# Patient Record
Sex: Female | Born: 1950
Health system: Southern US, Community
[De-identification: ages and names within clinical notes are randomized; demographics above are authoritative.]

## PROBLEM LIST (undated history)

## (undated) DIAGNOSIS — E039 Hypothyroidism, unspecified: Secondary | ICD-10-CM

## (undated) DIAGNOSIS — F419 Anxiety disorder, unspecified: Secondary | ICD-10-CM

## (undated) DIAGNOSIS — T7840XA Allergy, unspecified, initial encounter: Secondary | ICD-10-CM

## (undated) DIAGNOSIS — C801 Malignant (primary) neoplasm, unspecified: Secondary | ICD-10-CM

## (undated) DIAGNOSIS — F32A Depression, unspecified: Secondary | ICD-10-CM

## (undated) DIAGNOSIS — T4145XA Adverse effect of unspecified anesthetic, initial encounter: Secondary | ICD-10-CM

## (undated) DIAGNOSIS — Z9889 Other specified postprocedural states: Secondary | ICD-10-CM

## (undated) DIAGNOSIS — T8859XA Other complications of anesthesia, initial encounter: Secondary | ICD-10-CM

## (undated) DIAGNOSIS — R112 Nausea with vomiting, unspecified: Secondary | ICD-10-CM

## (undated) DIAGNOSIS — N6011 Diffuse cystic mastopathy of right breast: Secondary | ICD-10-CM

## (undated) DIAGNOSIS — M1990 Unspecified osteoarthritis, unspecified site: Secondary | ICD-10-CM

## (undated) DIAGNOSIS — I10 Essential (primary) hypertension: Secondary | ICD-10-CM

## (undated) HISTORY — PX: ABDOMINAL HYSTERECTOMY: SHX81

## (undated) HISTORY — DX: Allergy, unspecified, initial encounter: T78.40XA

## (undated) HISTORY — DX: Anxiety disorder, unspecified: F41.9

## (undated) HISTORY — DX: Essential (primary) hypertension: I10

## (undated) HISTORY — DX: Hypothyroidism, unspecified: E03.9

## (undated) HISTORY — DX: Malignant (primary) neoplasm, unspecified: C80.1

## (undated) HISTORY — DX: Depression, unspecified: F32.A

## (undated) HISTORY — PX: OTHER SURGICAL HISTORY: SHX169

## (undated) HISTORY — DX: Unspecified osteoarthritis, unspecified site: M19.90

## (undated) HISTORY — DX: Diffuse cystic mastopathy of right breast: N60.11

---
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
## 1995-02-19 DIAGNOSIS — C801 Malignant (primary) neoplasm, unspecified: Secondary | ICD-10-CM

## 1995-02-19 HISTORY — DX: Malignant (primary) neoplasm, unspecified: C80.1

## 1995-02-19 HISTORY — PX: THYROIDECTOMY: SHX17

## 1998-06-22 ENCOUNTER — Ambulatory Visit (HOSPITAL_COMMUNITY): Admission: RE | Admit: 1998-06-22 | Discharge: 1998-06-22 | Payer: Self-pay | Admitting: Obstetrics and Gynecology

## 2003-01-14 ENCOUNTER — Encounter: Admission: RE | Admit: 2003-01-14 | Discharge: 2003-01-14 | Payer: Self-pay | Admitting: Internal Medicine

## 2004-02-07 ENCOUNTER — Encounter: Admission: RE | Admit: 2004-02-07 | Discharge: 2004-02-07 | Payer: Self-pay | Admitting: Internal Medicine

## 2004-04-19 ENCOUNTER — Ambulatory Visit: Payer: Self-pay | Admitting: Internal Medicine

## 2004-04-26 ENCOUNTER — Other Ambulatory Visit: Admission: RE | Admit: 2004-04-26 | Discharge: 2004-04-26 | Payer: Self-pay | Admitting: Internal Medicine

## 2004-04-26 ENCOUNTER — Ambulatory Visit: Payer: Self-pay | Admitting: Internal Medicine

## 2004-07-24 ENCOUNTER — Ambulatory Visit: Payer: Self-pay | Admitting: Internal Medicine

## 2005-03-20 ENCOUNTER — Encounter: Admission: RE | Admit: 2005-03-20 | Discharge: 2005-03-20 | Payer: Self-pay | Admitting: Internal Medicine

## 2005-04-26 ENCOUNTER — Ambulatory Visit: Payer: Self-pay | Admitting: Internal Medicine

## 2005-05-03 ENCOUNTER — Encounter: Payer: Self-pay | Admitting: Internal Medicine

## 2005-05-03 ENCOUNTER — Ambulatory Visit: Payer: Self-pay | Admitting: Internal Medicine

## 2005-05-03 ENCOUNTER — Other Ambulatory Visit: Admission: RE | Admit: 2005-05-03 | Discharge: 2005-05-03 | Payer: Self-pay | Admitting: Internal Medicine

## 2005-09-05 ENCOUNTER — Ambulatory Visit: Payer: Self-pay | Admitting: Internal Medicine

## 2006-03-24 ENCOUNTER — Encounter: Admission: RE | Admit: 2006-03-24 | Discharge: 2006-03-24 | Payer: Self-pay | Admitting: Internal Medicine

## 2006-05-08 ENCOUNTER — Ambulatory Visit: Payer: Self-pay | Admitting: Internal Medicine

## 2006-05-08 LAB — CONVERTED CEMR LAB
ALT: 26 units/L (ref 0–40)
AST: 23 units/L (ref 0–37)
Albumin: 3.8 g/dL (ref 3.5–5.2)
Alkaline Phosphatase: 66 units/L (ref 39–117)
BUN: 9 mg/dL (ref 6–23)
Basophils Absolute: 0 10*3/uL (ref 0.0–0.1)
Basophils Relative: 0.8 % (ref 0.0–1.0)
Bilirubin, Direct: 0.1 mg/dL (ref 0.0–0.3)
CO2: 33 meq/L — ABNORMAL HIGH (ref 19–32)
Calcium: 9.1 mg/dL (ref 8.4–10.5)
Chloride: 104 meq/L (ref 96–112)
Cholesterol: 168 mg/dL (ref 0–200)
Creatinine, Ser: 0.5 mg/dL (ref 0.4–1.2)
Eosinophils Absolute: 0.1 10*3/uL (ref 0.0–0.6)
Eosinophils Relative: 2.8 % (ref 0.0–5.0)
GFR calc Af Amer: 165 mL/min
GFR calc non Af Amer: 137 mL/min
Glucose, Bld: 96 mg/dL (ref 70–99)
HCT: 39 % (ref 36.0–46.0)
HDL: 42.1 mg/dL (ref >39.0)
Hemoglobin: 13.7 g/dL (ref 12.0–15.0)
LDL Cholesterol: 107 mg/dL — ABNORMAL HIGH (ref 0–99)
Lymphocytes Relative: 37.9 % (ref 12.0–46.0)
MCHC: 35.1 g/dL (ref 30.0–36.0)
MCV: 85 fL (ref 78.0–100.0)
Monocytes Absolute: 0.5 10*3/uL (ref 0.2–0.7)
Monocytes Relative: 10.3 % (ref 3.0–11.0)
Neutro Abs: 2.3 10*3/uL (ref 1.4–7.7)
Neutrophils Relative %: 48.2 % (ref 43.0–77.0)
Platelets: 258 10*3/uL (ref 150–400)
Potassium: 3.5 meq/L (ref 3.5–5.1)
RBC: 4.58 M/uL (ref 3.87–5.11)
RDW: 12.3 % (ref 11.5–14.6)
Sodium: 142 meq/L (ref 135–145)
TSH: 0.02 microintl units/mL — ABNORMAL LOW (ref 0.35–5.50)
Total Bilirubin: 0.9 mg/dL (ref 0.3–1.2)
Total CHOL/HDL Ratio: 4
Total Protein: 6.7 g/dL (ref 6.0–8.3)
Triglycerides: 94 mg/dL (ref 0–149)
VLDL: 19 mg/dL (ref 0–40)
WBC: 4.7 10*3/uL (ref 4.5–10.5)

## 2006-05-16 ENCOUNTER — Encounter: Payer: Self-pay | Admitting: Internal Medicine

## 2006-05-16 ENCOUNTER — Other Ambulatory Visit: Admission: RE | Admit: 2006-05-16 | Discharge: 2006-05-16 | Payer: Self-pay | Admitting: Internal Medicine

## 2006-05-16 ENCOUNTER — Ambulatory Visit: Payer: Self-pay | Admitting: Internal Medicine

## 2007-04-06 ENCOUNTER — Encounter: Admission: RE | Admit: 2007-04-06 | Discharge: 2007-04-06 | Payer: Self-pay | Admitting: Internal Medicine

## 2007-05-19 ENCOUNTER — Ambulatory Visit: Payer: Self-pay | Admitting: Internal Medicine

## 2007-05-19 DIAGNOSIS — J309 Allergic rhinitis, unspecified: Secondary | ICD-10-CM

## 2007-05-19 DIAGNOSIS — E039 Hypothyroidism, unspecified: Secondary | ICD-10-CM | POA: Insufficient documentation

## 2007-05-19 DIAGNOSIS — I10 Essential (primary) hypertension: Secondary | ICD-10-CM | POA: Insufficient documentation

## 2007-05-19 DIAGNOSIS — M12559 Traumatic arthropathy, unspecified hip: Secondary | ICD-10-CM | POA: Insufficient documentation

## 2007-05-19 HISTORY — DX: Allergic rhinitis, unspecified: J30.9

## 2007-05-20 ENCOUNTER — Ambulatory Visit: Payer: Self-pay | Admitting: Internal Medicine

## 2007-05-22 ENCOUNTER — Ambulatory Visit: Payer: Self-pay | Admitting: Internal Medicine

## 2007-05-22 LAB — CONVERTED CEMR LAB
ALT: 24 units/L (ref 0–35)
AST: 23 units/L (ref 0–37)
Albumin: 3.9 g/dL (ref 3.5–5.2)
Alkaline Phosphatase: 54 units/L (ref 39–117)
BUN: 11 mg/dL (ref 6–23)
Basophils Absolute: 0 10*3/uL (ref 0.0–0.1)
Basophils Relative: 1 % (ref 0.0–1.0)
Bilirubin, Direct: 0.1 mg/dL (ref 0.0–0.3)
Blood in Urine, dipstick: NEGATIVE
CO2: 31 meq/L (ref 19–32)
Calcium: 9 mg/dL (ref 8.4–10.5)
Chloride: 106 meq/L (ref 96–112)
Cholesterol: 164 mg/dL (ref 0–200)
Creatinine, Ser: 0.7 mg/dL (ref 0.4–1.2)
Eosinophils Absolute: 0.1 10*3/uL (ref 0.0–0.7)
Eosinophils Relative: 3.1 % (ref 0.0–5.0)
GFR calc Af Amer: 112 mL/min
GFR calc non Af Amer: 92 mL/min
Glucose, Bld: 92 mg/dL (ref 70–99)
Glucose, Urine, Semiquant: NEGATIVE
HCT: 41.4 % (ref 36.0–46.0)
HDL: 35.4 mg/dL — ABNORMAL LOW (ref >39.0)
Hemoglobin: 13.6 g/dL (ref 12.0–15.0)
Ketones, urine, test strip: NEGATIVE
LDL Cholesterol: 113 mg/dL — ABNORMAL HIGH (ref 0–99)
Lymphocytes Relative: 37.6 % (ref 12.0–46.0)
MCHC: 32.9 g/dL (ref 30.0–36.0)
MCV: 86.7 fL (ref 78.0–100.0)
Monocytes Absolute: 0.5 10*3/uL (ref 0.1–1.0)
Monocytes Relative: 10.5 % (ref 3.0–12.0)
Neutro Abs: 2.4 10*3/uL (ref 1.4–7.7)
Neutrophils Relative %: 47.8 % (ref 43.0–77.0)
Nitrite: NEGATIVE
Platelets: 294 10*3/uL (ref 150–400)
Potassium: 4.1 meq/L (ref 3.5–5.1)
RBC: 4.77 M/uL (ref 3.87–5.11)
RDW: 11.7 % (ref 11.5–14.6)
Sodium: 142 meq/L (ref 135–145)
Specific Gravity, Urine: 1.02
TSH: 0.02 microintl units/mL — ABNORMAL LOW (ref 0.35–5.50)
Total Bilirubin: 0.8 mg/dL (ref 0.3–1.2)
Total CHOL/HDL Ratio: 4.6
Total Protein: 6.6 g/dL (ref 6.0–8.3)
Triglycerides: 80 mg/dL (ref 0–149)
Urobilinogen, UA: 0.2
VLDL: 16 mg/dL (ref 0–40)
WBC Urine, dipstick: NEGATIVE
WBC: 4.8 10*3/uL (ref 4.5–10.5)
pH: 7.5

## 2007-06-01 ENCOUNTER — Ambulatory Visit: Payer: Self-pay | Admitting: Internal Medicine

## 2007-06-01 ENCOUNTER — Encounter: Payer: Self-pay | Admitting: Internal Medicine

## 2007-06-01 ENCOUNTER — Other Ambulatory Visit: Admission: RE | Admit: 2007-06-01 | Discharge: 2007-06-01 | Payer: Self-pay | Admitting: Internal Medicine

## 2008-05-23 ENCOUNTER — Ambulatory Visit: Payer: Self-pay | Admitting: Internal Medicine

## 2008-05-23 LAB — CONVERTED CEMR LAB
ALT: 26 units/L (ref 0–35)
AST: 25 units/L (ref 0–37)
Albumin: 4.1 g/dL (ref 3.5–5.2)
Alkaline Phosphatase: 63 units/L (ref 39–117)
BUN: 9 mg/dL (ref 6–23)
Basophils Absolute: 0.1 10*3/uL (ref 0.0–0.1)
Basophils Relative: 0.9 % (ref 0.0–3.0)
Bilirubin Urine: NEGATIVE
Bilirubin, Direct: 0 mg/dL (ref 0.0–0.3)
Blood in Urine, dipstick: NEGATIVE
CO2: 28 meq/L (ref 19–32)
Calcium: 9 mg/dL (ref 8.4–10.5)
Chloride: 105 meq/L (ref 96–112)
Cholesterol: 176 mg/dL (ref 0–200)
Creatinine, Ser: 0.6 mg/dL (ref 0.4–1.2)
Eosinophils Absolute: 0.2 10*3/uL (ref 0.0–0.7)
Eosinophils Relative: 2.8 % (ref 0.0–5.0)
GFR calc non Af Amer: 109.67 mL/min (ref >60)
Glucose, Bld: 92 mg/dL (ref 70–99)
Glucose, Urine, Semiquant: NEGATIVE
HCT: 39.5 % (ref 36.0–46.0)
HDL: 32.8 mg/dL — ABNORMAL LOW (ref >39.00)
Hemoglobin: 13.6 g/dL (ref 12.0–15.0)
LDL Cholesterol: 121 mg/dL — ABNORMAL HIGH (ref 0–99)
Lymphocytes Relative: 36.4 % (ref 12.0–46.0)
Lymphs Abs: 2.1 10*3/uL (ref 0.7–4.0)
MCHC: 34.4 g/dL (ref 30.0–36.0)
MCV: 85.4 fL (ref 78.0–100.0)
Monocytes Absolute: 0.6 10*3/uL (ref 0.1–1.0)
Monocytes Relative: 10.2 % (ref 3.0–12.0)
Neutro Abs: 2.7 10*3/uL (ref 1.4–7.7)
Neutrophils Relative %: 49.7 % (ref 43.0–77.0)
Nitrite: NEGATIVE
Platelets: 386 10*3/uL (ref 150.0–400.0)
Potassium: 4.3 meq/L (ref 3.5–5.1)
RBC: 4.63 M/uL (ref 3.87–5.11)
RDW: 11.4 % — ABNORMAL LOW (ref 11.5–14.6)
Sodium: 142 meq/L (ref 135–145)
Specific Gravity, Urine: 1.02
TSH: 0.03 microintl units/mL — ABNORMAL LOW (ref 0.35–5.50)
Total Bilirubin: 0.7 mg/dL (ref 0.3–1.2)
Total CHOL/HDL Ratio: 5
Total Protein: 7.1 g/dL (ref 6.0–8.3)
Triglycerides: 111 mg/dL (ref 0.0–149.0)
Urobilinogen, UA: 0.2
VLDL: 22.2 mg/dL (ref 0.0–40.0)
WBC Urine, dipstick: NEGATIVE
WBC: 5.7 10*3/uL (ref 4.5–10.5)
pH: 6.5

## 2008-05-24 ENCOUNTER — Encounter: Admission: RE | Admit: 2008-05-24 | Discharge: 2008-05-24 | Payer: Self-pay | Admitting: Internal Medicine

## 2008-06-07 ENCOUNTER — Other Ambulatory Visit: Admission: RE | Admit: 2008-06-07 | Discharge: 2008-06-07 | Payer: Self-pay | Admitting: Neurosurgery

## 2008-06-07 ENCOUNTER — Encounter: Payer: Self-pay | Admitting: Internal Medicine

## 2008-06-07 ENCOUNTER — Ambulatory Visit: Payer: Self-pay | Admitting: Internal Medicine

## 2008-06-07 DIAGNOSIS — N72 Inflammatory disease of cervix uteri: Secondary | ICD-10-CM | POA: Insufficient documentation

## 2008-06-07 LAB — CONVERTED CEMR LAB: Pap Smear: NORMAL

## 2008-06-10 ENCOUNTER — Encounter (INDEPENDENT_AMBULATORY_CARE_PROVIDER_SITE_OTHER): Payer: Self-pay | Admitting: *Deleted

## 2009-02-13 ENCOUNTER — Ambulatory Visit: Payer: Self-pay | Admitting: Internal Medicine

## 2009-02-13 DIAGNOSIS — R5383 Other fatigue: Secondary | ICD-10-CM | POA: Insufficient documentation

## 2009-02-13 DIAGNOSIS — F5102 Adjustment insomnia: Secondary | ICD-10-CM | POA: Insufficient documentation

## 2009-02-13 DIAGNOSIS — R5381 Other malaise: Secondary | ICD-10-CM | POA: Insufficient documentation

## 2009-02-14 ENCOUNTER — Telehealth: Payer: Self-pay | Admitting: Internal Medicine

## 2009-02-14 ENCOUNTER — Encounter: Payer: Self-pay | Admitting: Internal Medicine

## 2009-02-14 LAB — CONVERTED CEMR LAB
Basophils Absolute: 0.1 10*3/uL (ref 0.0–0.1)
Basophils Relative: 0.8 % (ref 0.0–3.0)
Eosinophils Absolute: 0.1 10*3/uL (ref 0.0–0.7)
Eosinophils Relative: 1 % (ref 0.0–5.0)
Folate: >20 ng/mL
Free T4: 1.9 ng/dL — ABNORMAL HIGH (ref 0.6–1.6)
HCT: 44.8 % (ref 36.0–46.0)
Hemoglobin: 15 g/dL (ref 12.0–15.0)
Lymphocytes Relative: 22.9 % (ref 12.0–46.0)
Lymphs Abs: 1.8 10*3/uL (ref 0.7–4.0)
MCHC: 33.5 g/dL (ref 30.0–36.0)
MCV: 89.8 fL (ref 78.0–100.0)
Monocytes Absolute: 0.7 10*3/uL (ref 0.1–1.0)
Monocytes Relative: 8.5 % (ref 3.0–12.0)
Neutro Abs: 5.1 10*3/uL (ref 1.4–7.7)
Neutrophils Relative %: 66.8 % (ref 43.0–77.0)
Platelets: 267 10*3/uL (ref 150.0–400.0)
RBC: 4.99 M/uL (ref 3.87–5.11)
RDW: 12 % (ref 11.5–14.6)
T3, Free: 3.2 pg/mL (ref 2.3–4.2)
TSH: 0.02 microintl units/mL — ABNORMAL LOW (ref 0.35–5.50)
Vitamin B-12: 1208 pg/mL — ABNORMAL HIGH (ref 211–911)
WBC: 7.8 10*3/uL (ref 4.5–10.5)

## 2009-02-15 ENCOUNTER — Telehealth: Payer: Self-pay | Admitting: Internal Medicine

## 2009-02-16 ENCOUNTER — Ambulatory Visit: Payer: Self-pay | Admitting: Licensed Clinical Social Worker

## 2009-02-16 ENCOUNTER — Encounter: Payer: Self-pay | Admitting: Internal Medicine

## 2009-02-20 ENCOUNTER — Telehealth: Payer: Self-pay | Admitting: Internal Medicine

## 2009-02-21 ENCOUNTER — Ambulatory Visit: Payer: Self-pay | Admitting: Psychiatry

## 2009-02-21 ENCOUNTER — Telehealth: Payer: Self-pay | Admitting: Internal Medicine

## 2009-02-21 ENCOUNTER — Emergency Department (HOSPITAL_COMMUNITY): Admission: EM | Admit: 2009-02-21 | Discharge: 2009-02-21 | Payer: Self-pay | Admitting: Emergency Medicine

## 2009-02-21 ENCOUNTER — Inpatient Hospital Stay (HOSPITAL_COMMUNITY): Admission: EM | Admit: 2009-02-21 | Discharge: 2009-02-25 | Payer: Self-pay | Admitting: Psychiatry

## 2009-02-27 ENCOUNTER — Encounter: Payer: Self-pay | Admitting: Internal Medicine

## 2009-02-27 ENCOUNTER — Ambulatory Visit: Payer: Self-pay | Admitting: Licensed Clinical Social Worker

## 2009-03-02 ENCOUNTER — Telehealth: Payer: Self-pay | Admitting: Internal Medicine

## 2009-03-03 ENCOUNTER — Ambulatory Visit: Payer: Self-pay | Admitting: Internal Medicine

## 2009-03-03 DIAGNOSIS — F4322 Adjustment disorder with anxiety: Secondary | ICD-10-CM | POA: Insufficient documentation

## 2009-03-06 ENCOUNTER — Ambulatory Visit: Payer: Self-pay | Admitting: Licensed Clinical Social Worker

## 2009-03-13 ENCOUNTER — Ambulatory Visit: Payer: Self-pay | Admitting: Licensed Clinical Social Worker

## 2009-03-15 ENCOUNTER — Ambulatory Visit: Payer: Self-pay | Admitting: Internal Medicine

## 2009-03-20 ENCOUNTER — Ambulatory Visit: Payer: Self-pay | Admitting: Licensed Clinical Social Worker

## 2009-03-20 ENCOUNTER — Ambulatory Visit: Payer: Self-pay | Admitting: Internal Medicine

## 2009-03-28 ENCOUNTER — Telehealth: Payer: Self-pay | Admitting: Internal Medicine

## 2009-04-06 ENCOUNTER — Ambulatory Visit: Payer: Self-pay | Admitting: Internal Medicine

## 2009-05-19 ENCOUNTER — Ambulatory Visit: Payer: Self-pay | Admitting: Internal Medicine

## 2009-06-02 ENCOUNTER — Encounter: Admission: RE | Admit: 2009-06-02 | Discharge: 2009-06-02 | Payer: Self-pay | Admitting: Internal Medicine

## 2009-06-12 ENCOUNTER — Encounter: Admission: RE | Admit: 2009-06-12 | Discharge: 2009-06-12 | Payer: Self-pay | Admitting: Internal Medicine

## 2009-06-12 ENCOUNTER — Encounter: Payer: Self-pay | Admitting: Internal Medicine

## 2009-06-12 LAB — HM MAMMOGRAPHY

## 2009-07-27 ENCOUNTER — Ambulatory Visit: Payer: Self-pay | Admitting: Family Medicine

## 2009-07-27 DIAGNOSIS — M545 Low back pain, unspecified: Secondary | ICD-10-CM | POA: Insufficient documentation

## 2009-07-27 LAB — CONVERTED CEMR LAB
Bilirubin Urine: NEGATIVE
Blood in Urine, dipstick: NEGATIVE
Glucose, Urine, Semiquant: NEGATIVE
Ketones, urine, test strip: NEGATIVE
Nitrite: NEGATIVE
Protein, U semiquant: NEGATIVE
Specific Gravity, Urine: <1.005
Urobilinogen, UA: 0.2
WBC Urine, dipstick: NEGATIVE
pH: 7

## 2009-08-22 ENCOUNTER — Telehealth: Payer: Self-pay | Admitting: Internal Medicine

## 2009-09-15 ENCOUNTER — Ambulatory Visit: Payer: Self-pay | Admitting: Internal Medicine

## 2009-09-15 LAB — CONVERTED CEMR LAB
ALT: 24 units/L (ref 0–35)
AST: 25 units/L (ref 0–37)
Albumin: 4.2 g/dL (ref 3.5–5.2)
Alkaline Phosphatase: 48 units/L (ref 39–117)
BUN: 12 mg/dL (ref 6–23)
Basophils Absolute: 0.1 10*3/uL (ref 0.0–0.1)
Basophils Relative: 1.1 % (ref 0.0–3.0)
Bilirubin Urine: NEGATIVE
Bilirubin, Direct: 0.1 mg/dL (ref 0.0–0.3)
Blood in Urine, dipstick: NEGATIVE
CO2: 31 meq/L (ref 19–32)
Calcium: 8.6 mg/dL (ref 8.4–10.5)
Chloride: 104 meq/L (ref 96–112)
Cholesterol: 194 mg/dL (ref 0–200)
Creatinine, Ser: 0.6 mg/dL (ref 0.4–1.2)
Eosinophils Absolute: 0.2 10*3/uL (ref 0.0–0.7)
Eosinophils Relative: 3.7 % (ref 0.0–5.0)
GFR calc non Af Amer: 103.18 mL/min (ref >60)
Glucose, Bld: 87 mg/dL (ref 70–99)
Glucose, Urine, Semiquant: NEGATIVE
HCT: 38.8 % (ref 36.0–46.0)
HDL: 40.8 mg/dL (ref >39.00)
Hemoglobin: 13.3 g/dL (ref 12.0–15.0)
LDL Cholesterol: 140 mg/dL — ABNORMAL HIGH (ref 0–99)
Lymphocytes Relative: 35.8 % (ref 12.0–46.0)
Lymphs Abs: 1.8 10*3/uL (ref 0.7–4.0)
MCHC: 34.4 g/dL (ref 30.0–36.0)
MCV: 88.7 fL (ref 78.0–100.0)
Monocytes Absolute: 0.5 10*3/uL (ref 0.1–1.0)
Monocytes Relative: 9.3 % (ref 3.0–12.0)
Neutro Abs: 2.5 10*3/uL (ref 1.4–7.7)
Neutrophils Relative %: 50.1 % (ref 43.0–77.0)
Nitrite: NEGATIVE
Platelets: 251 10*3/uL (ref 150.0–400.0)
Potassium: 3.7 meq/L (ref 3.5–5.1)
RBC: 4.37 M/uL (ref 3.87–5.11)
RDW: 13.1 % (ref 11.5–14.6)
Sodium: 139 meq/L (ref 135–145)
Specific Gravity, Urine: 1.025
TSH: 0.04 microintl units/mL — ABNORMAL LOW (ref 0.35–5.50)
Total Bilirubin: 0.9 mg/dL (ref 0.3–1.2)
Total CHOL/HDL Ratio: 5
Total Protein: 6.7 g/dL (ref 6.0–8.3)
Triglycerides: 68 mg/dL (ref 0.0–149.0)
Urobilinogen, UA: 0.2
VLDL: 13.6 mg/dL (ref 0.0–40.0)
WBC Urine, dipstick: NEGATIVE
WBC: 5 10*3/uL (ref 4.5–10.5)
pH: 5.5

## 2009-10-06 ENCOUNTER — Other Ambulatory Visit: Admission: RE | Admit: 2009-10-06 | Discharge: 2009-10-06 | Payer: Self-pay | Admitting: Internal Medicine

## 2009-10-06 ENCOUNTER — Ambulatory Visit: Payer: Self-pay | Admitting: Internal Medicine

## 2009-10-06 LAB — CONVERTED CEMR LAB: Pap Smear: NORMAL

## 2009-10-11 LAB — CONVERTED CEMR LAB: Pap Smear: NEGATIVE

## 2010-01-05 ENCOUNTER — Ambulatory Visit: Payer: Self-pay | Admitting: Internal Medicine

## 2010-01-05 LAB — CONVERTED CEMR LAB
Free T4: 1.41 ng/dL (ref 0.60–1.60)
T3, Free: 3.2 pg/mL (ref 2.3–4.2)

## 2010-01-19 ENCOUNTER — Ambulatory Visit: Payer: Self-pay | Admitting: Internal Medicine

## 2010-02-23 ENCOUNTER — Ambulatory Visit
Admission: RE | Admit: 2010-02-23 | Discharge: 2010-02-23 | Payer: Self-pay | Source: Home / Self Care | Attending: Internal Medicine | Admitting: Internal Medicine

## 2010-03-06 ENCOUNTER — Telehealth: Payer: Self-pay | Admitting: Internal Medicine

## 2010-03-20 NOTE — Progress Notes (Signed)
Summary: insect bite  Phone Note Call from Patient   Caller: Patient Call For: Stacie Glaze MD Summary of Call: Pt stepped on bee 2 days ago, and has had allergic reactions in the past.  Inside arch of foot.......itches really bad...Marland KitchenMarland Kitchenused steroid cream..  Is askng for oral steroids or shot [phone]  [facility] Initial call taken by: Lynann Beaver CMA,  August 22, 2009 12:19 PM  Follow-up for Phone Call        prednisone 20 mg by mouth once daily for 4 days Follow-up by: Birdie Sons MD,  August 22, 2009 3:25 PM    New/Updated Medications: PREDNISONE 20 MG TABS (PREDNISONE) one by mouth daily x 4 days Prescriptions: PREDNISONE 20 MG TABS (PREDNISONE) one by mouth daily x 4 days  #4 x 0   Entered by:   Lynann Beaver CMA   Authorized by:   Birdie Sons MD   Signed by:   Lynann Beaver CMA on 08/22/2009   Method used:   [facility]        [facility]  [address] [id number]       [address]       [place], [place]  [address]       Ph: [phone] or [phone]       Fax: [phone]   RxID:   [id number]  pt notified.

## 2010-03-20 NOTE — Assessment & Plan Note (Signed)
Summary: cpx/pap/njr   Vital Signs:  Patient profile:   60 year old female Height:      71 inches Weight:      180 pounds BMI:     25.20 Temp:     98.2 degrees F oral Pulse rate:   72 / minute Resp:     14 per minute BP sitting:   136 / 82  (left arm)  Vitals Entered By: Willy Eddy, LPN (October 06, 2009 2:43 PM) CC: cpx and pap---no colonoscopy Is Patient Diabetic? No     Last PAP Result normal   CC:  cpx and pap---no colonoscopy.  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 1 Once Daily 2)  Atenolol-Chlorthalidone 50-25 Mg  Tabs (Atenolol-Chlorthalidone) .... Once Daily 3)  Flonase 50 Mcg/act  Susp (Fluticasone Propionate) .... As Needed 4)  Esgic-Plus 50-500-40 Mg  Tabs (Butalbital-Apap-Caffeine) .... One By Mouth Q 6 Hours As Needed Ha 5)  Cymbalta 60 Mg Cpep (Duloxetine Hcl) .... One By Mouth Daily 6)  Ativan 0.5 Mg Tabs (Lorazepam) .[name].. 1 Q6 Hours As Needed  Allergies (verified): No Known Drug Allergies  Past History:  Social History: Last updated: 05/19/2007 Married Former Smoker  Risk Factors: Smoking Status: never (10/06/2009)  Past medical, surgical, family and social histories (including risk factors) reviewed, and no changes noted (except as noted below).  Past Medical History: Reviewed history from 05/19/2007 and no changes  required. Hypertension Hypothyroidism Allergic rhinitis  Family History: Reviewed history and no changes required.  Social History: Reviewed history from 05/19/2007 and no changes required. Married Former Smoker  Review of Systems  The patient denies anorexia, fever, weight loss, weight gain, vision loss, decreased hearing, hoarseness, chest pain, syncope, dyspnea on exertion, peripheral edema, prolonged cough, headaches, hemoptysis, abdominal pain, melena, hematochezia, severe indigestion/heartburn, hematuria, incontinence, genital sores, muscle weakness, suspicious skin lesions, transient blindness, difficulty walking, depression, unusual weight change, abnormal bleeding, enlarged lymph nodes, angioedema, and breast masses.    Physical Exam  General:  Well-developed,well-nourished,in no acute distress; alert,appropriate and cooperative throughout examination Head:  Normocephalic and atraumatic without obvious abnormalities. No apparent alopecia or balding. Eyes:  pupils equal and pupils round.   Ears:  R ear normal and L ear normal.   Nose:  no external deformity and no nasal discharge.   Neck:  No deformities, masses, or tenderness noted. Breasts:  had 5x3 cm cyst at 12 oclock on the left breast similar to prior Lungs:  Normal respiratory effort, chest expands symmetrically. Lungs are clear to auscultation, no crackles or wheezes. Heart:  Normal rate and regular rhythm. S1 and S2 normal without gallop, murmur, click, rub or other extra sounds. Abdomen:  soft, non-tender, no masses, and no guarding.   Msk:  Mild R SI joint tenderness. SLRs neg. Pain with back flexion of 35 degrees but none with back extension. Pulses:  R and L carotid,radial,femoral,dorsalis pedis and posterior tibial pulses are full  and equal bilaterally Extremities:  No clubbing, cyanosis, edema, or deformity noted with normal full range of motion of all joints.   Neurologic:  alert & oriented X3, strength  normal in all extremities, sensation intact to light touch, and DTRs symmetrical and normal.   Psych:  labile affect and moderately anxious.     Impression & Recommendations:  Problem # 1:  HYPOTHYROIDISM (ICD-244.9) slight suppression the thyroid need the moniter t3 and t4 in 3 month Her updated medication list for this problem includes:    Levothroid 175 Mcg Tabs (Levothyroxine sodium) .[name]... 1 once daily  Labs Reviewed: TSH: 0.04 (09/15/2009)    Chol: 194 (09/15/2009)   HDL: 40.80 (09/15/2009)   LDL: 140 (09/15/2009)   TG: 68.0 (09/15/2009)  Problem # 2:  PHYSICAL EXAMINATION (ICD-V70.0) Assessment: Unchanged The pt was asked about all immunizations, health maint. services that are appropriate to their age and was given guidance on diet exercize  and weight management  Mammogram: normal (08/17/2009) Pap smear: normal (10/06/2009) Td Booster: Td (05/19/2005)   Chol: 194 (09/15/2009)   HDL: 40.80 (09/15/2009)   LDL: 140 (09/15/2009)   TG: 68.0 (09/15/2009) TSH: 0.04 (09/15/2009)   Next mammogram due:: 08/2010 (10/06/2009)  Discussed using sunscreen, use of alcohol, drug use, self breast exam, routine dental care, routine eye care, schedule for GYN exam, routine physical exam, seat belts, multiple vitamins, osteoporosis prevention, adequate calcium intake in diet, recommendations for immunizations, mammograms and Pap smears.  Discussed exercise and checking cholesterol.  Discussed gun safety, safe sex, and contraception.  Complete Medication List: 1)  Levothroid 175 Mcg Tabs (Levothyroxine sodium) .[name].. 1 once daily 2)  Atenolol-chlorthalidone 50-25 Mg Tabs (Atenolol-chlorthalidone) .... Once daily 3)  Flonase 50 Mcg/act Susp (Fluticasone propionate) .... As needed 4)  Esgic-plus 50-500-40 Mg Tabs (Butalbital-apap-caffeine) .... One by mouth q 6 hours as needed ha 5)  Cymbalta 60 Mg Cpep (Duloxetine hcl) .... One by mouth daily 6)  Ativan 0.5 Mg Tabs (Lorazepam) .[name].. 1 q6 hours as  needed  Patient Instructions: 1)  Please schedule a follow-up appointment in 3 months. 2)  T3 free and T4 free fasting 244.8 Prescriptions: ATENOLOL-CHLORTHALIDONE 50-25 MG  TABS (ATENOLOL-CHLORTHALIDONE) once daily  #90 x 3   Entered and Authorized by:   Stacie Glaze MD   Signed by:   Stacie Glaze MD on 10/06/2009   Method used:   Electronically to        [facility] (retail)       [address]       [place], [place]  [address]       Ph: [phone]       Fax: [phone]   RxID:   [id number] LEVOTHROID 175 MCG TABS (LEVOTHYROXINE SODIUM) 1 once daily  #90 x 3   Entered and Authorized by:   Stacie Glaze MD   Signed by:   Stacie Glaze MD on 10/06/2009   Method used:   Electronically to        [facility] (retail)       [address]       [place], [place]  [address]       Ph: [phone]       Fax: [phone]   RxID:   [id number]    Preventive Care Screening  Mammogram:    Date:  08/17/2009    Next  Due:  08/2010    Results:  normal   Pap Smear:    Date:  10/06/2009    Next Due:  10/2010    Results:  normal

## 2010-03-20 NOTE — Progress Notes (Signed)
Summary: crisis mode   Phone Note Call from Patient   Caller: Theresa Perez [phone] Call For: [name] Summary of Call: Re depression and inability to sleep.  In crisis mode & at ER [facility] with her.  Thoughts of hurting self is she has to stay at home.  Feeling hopeless.  Sleeping med not working.  Zoloft not helping yet.   Please call if you can help, we are in dire need, & so we won't have to sit here & wait hours.  Will stay here if we have to.   Initial call taken by: Rudy Jew, RN,  February 21, 2009 12:43 PM  Follow-up for Phone Call        Relayed to pt that Dr. Lovell Sheehan would like her to be evaluated by pych,  and probable admission for evaluation. Follow-up by: Lynann Beaver CMA,  February 21, 2009 12:57 PM  Additional Follow-up for Phone Call Additional follow up Details #1::        agree with inpt Additional Follow-up by: Stacie Glaze MD,  February 21, 2009 1:00 PM

## 2010-03-20 NOTE — Progress Notes (Signed)
Summary: requesting alternative to sleep aid  Phone Note Call from Patient Call back at Home Phone [phone]   Caller: Patient-live call Summary of Call: Generic Ambien isn"t strong enough. Only sleeping for 2 hours. [facility] [address]  Initial call taken by: Warnell Forester,  February 20, 2009 9:57 AM  Follow-up for Phone Call        stop the Dud[place] and try seroquil 25 mg by mouth q HS  number 30 Follow-up by: Stacie Glaze MD,  February 20, 2009 12:09 PM    New/Updated Medications: SEROQUEL 25 MG TABS (QUETIAPINE FUMARATE) one by mouth q hs   Prescriptions: SEROQUEL 25 MG TABS (QUETIAPINE FUMARATE) one by mouth q hs  #30 x 0   Entered by:   Lynann Beaver CMA   Authorized by:   Stacie Glaze MD   Signed by:   Lynann Beaver CMA on 02/20/2009   Method used:   [facility]        [facility]  [address] [id number]       [address]       [place], [place]  [address]       Ph: [phone] or [phone]       Fax: [phone]   RxID:   [id number]  Pt notified.

## 2010-03-20 NOTE — Assessment & Plan Note (Signed)
Summary: severe lower back pain/pt can't straighten up/cjr   Vital Signs:  Patient profile:   60 year old female Temp:     99.4 degrees F oral BP sitting:   140 / 80  (left arm) Cuff size:   regular  Vitals Entered By: Sid Falcon LPN (July 28, 2839 4:16 PM) CC: severe low back pain, Back Pain   History of Present Illness: onset one week ago.       This is a 60 year old woman who presents with Back Pain.  The patient denies fever, chills, loss of sensation, urinary incontinence, and dysuria.  The pain is located in the right low back.  The pain radiates to the right buttock.  The pain is made worse by flexion.  The pain is made better by NSAID medications (advil).  Risk factors for serious underlying conditions include age >= 50 years.    Allergies (verified): No Known Drug Allergies  Past History:  Past Medical History: Last updated: 05/19/2007 Hypertension Hypothyroidism Allergic rhinitis  Social History: Last updated: 05/19/2007 Married Former Smoker PMH reviewed for relevance, SH/Risk Factors reviewed for relevance  Review of Systems  The patient denies anorexia, fever, weight loss, chest pain, peripheral edema, abdominal pain, melena, hematochezia, severe indigestion/heartburn, incontinence, muscle weakness, and enlarged lymph nodes.    Physical Exam  General:  Well-developed,well-nourished,in no acute distress; alert,appropriate and cooperative throughout examination Lungs:  Normal respiratory effort, chest expands symmetrically. Lungs are clear to auscultation, no crackles or wheezes. Heart:  Normal rate and regular rhythm. S1 and S2 normal without gallop, murmur, click, rub or other extra sounds. Msk:  Mild R SI joint tenderness. SLRs neg. Pain with back flexion of 35 degrees but none with back extension. Extremities:  No clubbing, cyanosis, edema, or deformity noted with normal full range of motion of all joints.   Neurologic:  alert & oriented X3, strength  normal in all extremities, sensation intact to light touch, and DTRs symmetrical and normal.     Impression & Recommendations:  Problem # 1:  LUMBAGO (ICD-724.2) Assessment New  try NSAID and reviewed some stretches.  UA unremarkable. Her updated medication list for this problem includes:    Esgic-plus 50-500-40 Mg Tabs (Butalbital-apap-caffeine) ..... One by mouth q 6 hours as needed ha    Etodolac 400 Mg Tabs (Etodolac) ..... One by mouth two times a day as needed  Orders: UA Dipstick w/o Micro (manual) (32440)  Complete Medication List: 1)  Levothroid 175 Mcg Tabs (Levothyroxine sodium) .[name].. 1 once daily 2)  Atenolol-chlorthalidone 50-25 Mg Tabs (Atenolol-chlorthalidone) .... Once daily 3)  Flonase 50 Mcg/act Susp (Fluticasone propionate) .... As needed 4)  Esgic-plus 50-500-40 Mg Tabs (Butalbital-apap-caffeine) .... One by mouth q 6 hours as needed ha 5)  Cymbalta 60 Mg Cpep (Duloxetine hcl) .... One by mouth daily 6)  Ativan 0.5 Mg Tabs (Lorazepam) .[name].. 1 q6 hours as needed 7)  Etodolac 400 Mg Tabs (Etodolac) .... One by mouth two times a day as needed  Patient Instructions: 1)  Most patients (90%) with low back pain will improve with time ( 2-6 weeks). Keep active but avoid activities that are painful. Apply moist heat and/or ice to lower back several times a day.  Prescriptions: ETODOLAC 400 MG TABS (ETODOLAC) one by mouth two times a day as needed  #40 x 0   Entered and Authorized by:   Evelena Peat MD   Signed by:   Evelena Peat MD on 07/27/2009   Method  used:   [facility]  [address] [id number]       [address]       [place], [place]  [address]       Ph: [phone] or [phone]       Fax: [phone]   RxID:   [id number]   Laboratory Results   Urine Tests    Routine Urinalysis   Color: yellow Appearance: Clear Glucose: negative   (Normal Range: Negative) Bilirubin: negative   (Normal Range:  Negative) Ketone: negative   (Normal Range: Negative) Spec. Gravity: <1.005   (Normal Range: 1.003-1.035) Blood: negative   (Normal Range: Negative) pH: 7.0   (Normal Range: 5.0-8.0) Protein: negative   (Normal Range: Negative) Urobilinogen: 0.2   (Normal Range: 0-1) Nitrite: negative   (Normal Range: Negative) Leukocyte Esterace: negative   (Normal Range: Negative)    Comments: Sid Falcon LPN  July 28, 4330 4:57 PM

## 2010-03-20 NOTE — Assessment & Plan Note (Signed)
Summary: POST HOS FUP/NJR   Vital Signs:  Patient profile:   60 year old female Height:      71 inches Weight:      170 pounds BMI:     23.80 Temp:     98.2 degrees F oral Pulse rate:   72 / minute Resp:     14 per minute BP sitting:   110 / 80  (left arm)  Vitals Entered By: Willy Eddy, LPN (March 03, 2009 11:24 AM) CC: post hospital-states she is better but not quite there yet--appear calmer and more concentrated on what she is talking about---had abnormal thryoid that synthoid needs changing per your note   CC:  post hospital-states she is better but not quite there yet--appear calmer and more concentrated on what she is talking about---had abnormal thryoid that synthoid needs changing per your note.  History of Present Illness: tHE PT IS ON SEROQUIL ( WE STARTED THIS  JUST BEFOR SHE WENT TO THE HOPSITAL) The main issue is the raceing thoughs and the insomnia on top of a OCD personality she is aware of the issue but cannot control it ( family  job and loife stresses are increased. 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 Once Daily 2)  Atenolol-Chlorthalidone 50-25 Mg  Tabs (Atenolol-Chlorthalidone) .... Once Daily 3)  Flonase 50 Mcg/act  Susp (Fluticasone Propionate) .... As Needed 4)  Esgic-Plus 50-500-40 Mg  Tabs (Butalbital-Apap-Caffeine) .... One By Mouth Q 6 Hours As Needed Ha 5)  Zoloft 50 Mg  Tabs (Sertraline Hcl) .... One By Mouth Daily 6)  Seroquel 25 Mg Tabs (Quetiapine Fumarate) .... One By Mouth Q Hs  Current Medications (verified): 1)  Levothyroxine Sodium 200 Mcg  Solr (Levothyroxine Sodium) .... Once Daily 2)  Atenolol-Chlorthalidone 50-25 Mg  Tabs (Atenolol-Chlorthalidone) .... Once Daily 3)  Flonase 50 Mcg/act  Susp (Fluticasone Propionate) .... As Needed 4)  Esgic-Plus 50-500-40 Mg  Tabs (Butalbital-Apap-Caffeine) .... One By Mouth Q 6 Hours As Needed Ha 5)  Zoloft 50 Mg Tabs (Sertraline Hcl) .... 2 Once Daily 6)  Seroquel 25 Mg Tabs (Quetiapine Fumarate) .... 2-4 Q Hs 7)  Ativan 0.5 Mg Tabs (Lorazepam) .[name].. 1 Q6 Hours As Needed  Allergies (verified): No Known Drug Allergies  Past History:  Social History: Last updated: 05/19/2007 Married Former Smoker  Risk Factors: Smoking Status: never (03/03/2009)  Past medical, surgical, family and social histories (including risk factors) reviewed, and no changes noted (except as noted below).  Past Medical History: Reviewed history from 05/19/2007 and no changes required. Hypertension Hypothyroidism Allergic rhinitis  Family History: Reviewed history and no changes required.  Social History: Reviewed history from 05/19/2007 and no changes required. Married Former Smoker  Review of Systems  The patient denies anorexia, fever, weight loss, weight gain, vision loss, decreased hearing, hoarseness, chest pain, syncope, dyspnea on exertion, peripheral edema, prolonged cough, headaches, hemoptysis, abdominal pain, melena, hematochezia, severe indigestion/heartburn, hematuria, incontinence, genital sores, muscle weakness,  suspicious skin lesions, transient blindness, difficulty walking, depression, unusual weight change, abnormal bleeding, enlarged lymph nodes, angioedema, and breast masses.    Physical Exam  General:  Well-developed,well-nourished,in no acute distress; alert,appropriate and cooperative throughout  examination Head:  Normocephalic and atraumatic without obvious abnormalities. No apparent alopecia or balding. Ears:  R ear normal and L ear normal.   Nose:  no external deformity and no nasal discharge.   Neck:  No deformities, masses, or tenderness noted. Lungs:  normal respiratory effort and no wheezes.   Heart:  normal rate and regular rhythm.   Psych:  labile affect and moderately anxious.     Impression & Recommendations:  Problem # 1:  HYPOTHYROIDISM (ICD-244.9) Assessment Deteriorated  Her updated medication list for this problem includes:    Levothyroxine Sodium 200 Mcg Solr (Levothyroxine sodium) ..... Once daily  Labs Reviewed: TSH: 0.02 (02/13/2009)    Chol: 176 (05/23/2008)   HDL: 32.80 (05/23/2008)   LDL: 121 (05/23/2008)   TG: 111.0 (05/23/2008)  Problem # 2:  TRANSIENT DISORDER INITIATING/MAINTAINING SLEEP (ICD-307.41) Assessment: Deteriorated  Problem # 3:  ADJUSTMENT DISORDER WITH ANXIETY (ICD-309.24) ativan and seroquil with continued psych counciling  Complete Medication List: 1)  Levothyroxine Sodium 200 Mcg Solr (Levothyroxine sodium) .... Once daily 2)  Atenolol-chlorthalidone 50-25 Mg Tabs (Atenolol-chlorthalidone) .... Once daily 3)  Flonase 50 Mcg/act Susp (Fluticasone propionate) .... As needed 4)  Esgic-plus 50-500-40 Mg Tabs (Butalbital-apap-caffeine) .... One by mouth q 6 hours as needed ha 5)  Zoloft 50 Mg Tabs (Sertraline hcl) .... 2 once daily 6)  Seroquel 25 Mg Tabs (Quetiapine fumarate) .... 2-4 q hs 7)  Ativan 0.5 Mg Tabs (Lorazepam) .[name].. 1 q6 hours as needed  Patient Instructions: 1)  Please schedule a follow-up appointment in 2 weeks.

## 2010-03-20 NOTE — Progress Notes (Signed)
----   Converted from flag ---- ---- 02/24/2009 12:25 PM, Willy Eddy, LPN wrote: CHECK CONE TSH ------------------------------  Phone Note Other Incoming   Reason for Call: Discuss lab or test results Request: Send information Summary of Call: pt had Thyroid studies at the hospitalt which showed increased levels of tyroid hormones above normal change synthroif to 175 by mouth daily  Initial call taken by: Stacie Glaze MD,  March 02, 2009 12:54 PM

## 2010-03-20 NOTE — Progress Notes (Signed)
Summary: MED DOSE DECREASE  Phone Note Call from Patient Call back at Home Phone [phone]   Caller: Patient Call For: Stacie Glaze MD Summary of Call: PT STATED LEVOTHYROXINE WAS CHANGE TO 175 MCG CALL INT0  [facility] [phone] Initial call taken by: Heron Sabins,  March 28, 2009 10:39 AM    New/Updated Medications: LEVOTHROID 175 MCG TABS (LEVOTHYROXINE SODIUM) 1 once daily Prescriptions: LEVOTHROID 175 MCG TABS (LEVOTHYROXINE SODIUM) 1 once daily  #30 x 6   Entered by:   Willy Eddy, LPN   Authorized by:   Stacie Glaze MD   Signed by:   Willy Eddy, LPN on 13/09/6576   Method used:   [facility]  [address] [id number]       [address]       [place], [place]  [address]       Ph: [phone] or [phone]       Fax: [phone]   RxID:   [id number]

## 2010-03-20 NOTE — Medication Information (Signed)
Summary: Coverage Approval for Zolpidem Tartrate/[employer]  Coverage Approval for Zolpidem Tartrate/[employer]   Imported By: Maryln Gottron 02/22/2009 12:51:32  _____________________________________________________________________  External Attachment:    Type:   Image     Comment:   External Document

## 2010-03-20 NOTE — Assessment & Plan Note (Signed)
Summary: 2 wk rov/mm   Vital Signs:  Patient profile:   60 year old female Height:      71 inches Weight:      168 pounds BMI:     23.52 Temp:     98.2 degrees F oral Pulse rate:   72 / minute Resp:     14 per minute BP sitting:   134 / 80  (left arm)  Vitals Entered By: Willy Eddy, LPN (March 20, 2009 1:32 PM) CC: ROA-IMPROVING, BUT C/O NO ENERGY, Hypertension Management   CC:  ROA-IMPROVING, BUT C/O NO ENERGY, and Hypertension Management.  History of Present Illness: I am not as good as I want to be.... when I awaken I do not have the energy I need and have trouble getting motivated. But has  concerns about  returning to work DIscusion of attempt to go back to work seems to be able to cfocus at this evaluation   Hypertension History:      She denies headache, chest pain, palpitations, dyspnea with exertion, orthopnea, PND, peripheral edema, visual symptoms, neurologic problems, syncope, and side effects from treatment.        Positive major cardiovascular risk factors include female age 5 years old or older and hypertension.  Negative major cardiovascular risk factors include non-tobacco-user status.     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) 8)  Allergic Rhinitis  (ICD-477.9) 9)  Hypothyroidism  (ICD-244.9) 10)  Hypertension  (ICD-401.9)  Current Problems (verified): 1)  Adjustment Disorder With Anxiety  (ICD-309.24) 2)  Malaise and Fatigue  (ICD-780.79) 3)  Transient Disorder Initiating/maintaining Sleep  (ICD-307.41) 4)  Nabothian Cyst  (ICD-616.0) 5)   Physical Examination  (ICD-V70.0) 6)  Neck Sprain and Strain  (ICD-847.0) 7)  Traumatic Arthropathy Pelvic Region and Thigh  (ICD-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 Once Daily 2)  Atenolol-Chlorthalidone 50-25 Mg  Tabs (Atenolol-Chlorthalidone) .... Once Daily 3)  Flonase 50 Mcg/act  Susp (Fluticasone Propionate) .... As Needed 4)  Esgic-Plus 50-500-40 Mg  Tabs (Butalbital-Apap-Caffeine) .... One By Mouth Q 6 Hours As Needed Ha 5)  Zoloft 50 Mg Tabs (Sertraline Hcl) .... 2 Once Daily 6)  Seroquel 25 Mg Tabs (Quetiapine Fumarate) .... 2-4 Q Hs 7)  Ativan 0.5 Mg Tabs (Lorazepam) .[name].. 1 Q6 Hours As Needed  Current Medications (verified): 1)  Levothyroxine Sodium 200 Mcg  Solr (Levothyroxine Sodium) .... Once Daily 2)  Atenolol-Chlorthalidone 50-25 Mg  Tabs (Atenolol-Chlorthalidone) .... Once Daily 3)  Flonase 50 Mcg/act  Susp (Fluticasone Propionate) .... As Needed 4)  Esgic-Plus 50-500-40 Mg  Tabs (Butalbital-Apap-Caffeine) .... One By Mouth Q 6 Hours As Needed Ha 5)  Zoloft 50 Mg Tabs (Sertraline Hcl) .... 2 Once Daily 6)  Seroquel 25 Mg Tabs (Quetiapine Fumarate) .... 2-4 Q Hs 7)  Ativan 0.5 Mg Tabs (Lorazepam) .[name].. 1 Q6 Hours As Needed  Allergies (verified): No Known Drug Allergies  Past History:  Social History: Last updated: 05/19/2007 Married Former  Smoker  Risk Factors: Smoking Status: never (03/20/2009)  Past medical, surgical, family and social histories (including risk factors) reviewed, and no changes noted (except as noted below).  Past Medical History: Reviewed history from 05/19/2007 and no changes required. Hypertension Hypothyroidism Allergic rhinitis  Family History: Reviewed history and no changes required.  Social History: Reviewed history from 05/19/2007 and no changes  required. Married Former Smoker  Review of Systems  The patient denies anorexia, fever, weight loss, weight gain, vision loss, decreased hearing, hoarseness, chest pain, syncope, dyspnea on exertion, peripheral edema, prolonged cough, headaches, hemoptysis, abdominal pain, melena, hematochezia, severe indigestion/heartburn, hematuria, incontinence, genital sores, muscle weakness, suspicious skin lesions, transient blindness, difficulty walking, depression, unusual weight change, abnormal bleeding, enlarged lymph nodes, angioedema, and breast masses.    Physical Exam  General:  Well-developed,well-nourished,in no acute distress; alert,appropriate and cooperative throughout examination Head:  Normocephalic and atraumatic without obvious abnormalities. No apparent alopecia or balding. Eyes:  pupils equal and pupils round.   Ears:  R ear normal and L ear normal.   Nose:  no external deformity and no nasal discharge.   Neck:  No deformities, masses, or tenderness noted. Lungs:  normal respiratory effort and no wheezes.   Heart:  normal rate and regular rhythm.   Abdomen:  soft, non-tender, no masses, and no guarding.   Extremities:  trace left pedal edema and trace right pedal edema.   Neurologic:  alert & oriented X3 and gait normal.     Impression & Recommendations:  Problem # 1:  ADJUSTMENT DISORDER WITH ANXIETY (ICD-309.24) discusson of motivation and medications  Problem # 2:  HYPERTENSION (ICD-401.9) Assessment: Unchanged  Her updated medication list for this problem includes:    Atenolol-chlorthalidone 50-25 Mg Tabs (Atenolol-chlorthalidone) ..... Once daily  BP today: 134/80 Prior BP: 110/80 (03/03/2009)  10 Yr Risk Heart Disease: 17 % Prior 10 Yr Risk Heart Disease: 24 % (02/13/2009)  Labs Reviewed: K+: 4.3 (05/23/2008) Creat: : 0.6 (05/23/2008)   Chol: 176 (05/23/2008)   HDL: 32.80 (05/23/2008)   LDL: 121 (05/23/2008)   TG: 111.0 (05/23/2008)  Problem # 3:   HYPOTHYROIDISM (ICD-244.9) Assessment: Unchanged  Her updated medication list for this problem includes:    Levothyroxine Sodium 200 Mcg Solr (Levothyroxine sodium) ..... Once daily  Labs Reviewed: TSH: 0.02 (02/13/2009)    Chol: 176 (05/23/2008)   HDL: 32.80 (05/23/2008)   LDL: 121 (05/23/2008)   TG: 111.0 (05/23/2008)  Problem # 4:  HYPERTENSION (ICD-401.9) Assessment: Unchanged  Her updated medication list for this problem includes:    Atenolol-chlorthalidone 50-25 Mg Tabs (Atenolol-chlorthalidone) ..... Once daily  BP today: 134/80 Prior BP: 110/80 (03/03/2009)  10 Yr Risk Heart Disease: 17 % Prior 10 Yr Risk Heart Disease: 24 % (02/13/2009)  Labs Reviewed: K+: 4.3 (05/23/2008) Creat: : 0.6 (05/23/2008)   Chol: 176 (05/23/2008)   HDL: 32.80 (05/23/2008)   LDL: 121 (05/23/2008)   TG: 111.0 (05/23/2008)  Complete Medication List: 1)  Levothyroxine Sodium 200 Mcg Solr (Levothyroxine sodium) .... Once daily 2)  Atenolol-chlorthalidone 50-25 Mg Tabs (Atenolol-chlorthalidone) .... Once daily 3)  Flonase 50 Mcg/act Susp (Fluticasone propionate) .... As needed 4)  Esgic-plus 50-500-40 Mg Tabs (Butalbital-apap-caffeine) .... One by mouth q 6 hours as needed ha 5)  Zoloft 50 Mg Tabs (Sertraline hcl) .... 2 once daily 6)  Seroquel 25 Mg Tabs (Quetiapine fumarate) .... 2-4 q hs 7)  Ativan 0.5 Mg Tabs (Lorazepam) .[name].. 1 q6 hours as needed  Hypertension Assessment/Plan:  The patient's hypertensive risk group is category B: At least one risk factor (excluding diabetes) with no target organ damage.  Her calculated 10 year risk of coronary heart disease is 17 %.  Today's blood pressure is 134/80.  Her blood pressure goal is < 140/90.  Patient Instructions: 1)  Please schedule a follow-up appointment in 2 weeks.

## 2010-03-20 NOTE — Letter (Signed)
Summary: Out of Work  Adult nurse at [employer]  [address]   [place], [place] [address]   Phone: [phone]  Fax: [phone]    February 27, 2009   Employee:  CLETUS MEHLHOFF    To Whom It May Concern:   For Medical reasons, please excuse the above named employee from work for the following dates:  Start:02/24/2009  End:03/27/2009    If you need additional information, please feel free to contact our office.Pt needs this time out for medication management and to recouperate from recent medication condition. Sincerely,    Dr. Darryll Capers

## 2010-03-20 NOTE — Medication Information (Signed)
Summary: Prior Authorization Request for Zolpidem Tartrate/[facility]  Prior Authorization Request for Zolpidem Tartrate/[facility]   Imported By: Maryln Gottron 02/24/2009 15:06:34  _____________________________________________________________________  External Attachment:    Type:   Image     Comment:   External Document

## 2010-03-20 NOTE — Assessment & Plan Note (Signed)
Summary: 1 month fup/ccm R/S.Marland KitchenSLM   Vital Signs:  Patient profile:   60 year old female Height:      71 inches Weight:      171 pounds BMI:     23.94 Temp:     98.2 degrees F oral Pulse rate:   72 / minute Resp:     14 per minute BP sitting:   130 / 80  (left arm)  Vitals Entered By: Willy Eddy, LPN (May 19, 1608 4:01 PM) CC: roa- -states she feeling much better- stopped seroquel-- too drowsey, Hypertension Management   CC:  roa- -states she feeling much better- stopped seroquel-- too drowsey and Hypertension Management.  History of Present Illness: has not required the seroquil mood is extremely better playing tenis and bowling  Hypertension History:      She denies headache, chest pain, palpitations, dyspnea with exertion, orthopnea, PND, peripheral edema, visual symptoms, neurologic problems, syncope, and side effects from treatment.        Positive major cardiovascular risk factors include female age 70 years old or older and hypertension.  Negative major cardiovascular risk factors include non-tobacco-user status.     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) 8)  Allergic Rhinitis  (ICD-477.9) 9)  Hypothyroidism  (ICD-244.9) 10)  Hypertension  (ICD-401.9)  Current Problems (verified): 1)  Adjustment Disorder With Anxiety  (ICD-309.24) 2)  Malaise and Fatigue  (ICD-780.79) 3)  Transient Disorder Initiating/maintaining Sleep  (ICD-307.41) 4)  Nabothian Cyst  (ICD-616.0) 5)  Physical Examination  (ICD-V70.0) 6)  Neck Sprain and Strain  (ICD-847.0) 7)   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 1 Once Daily 2)  Atenolol-Chlorthalidone 50-25 Mg  Tabs (Atenolol-Chlorthalidone) .... Once Daily 3)  Flonase 50 Mcg/act  Susp (Fluticasone Propionate) .... As Needed 4)  Esgic-Plus 50-500-40 Mg  Tabs (Butalbital-Apap-Caffeine) .... One By Mouth Q 6 Hours As Needed Ha 5)  Cymbalta 60 Mg Cpep (Duloxetine Hcl) .... One By Mouth Daily 6)  Seroquel 25 Mg Tabs (Quetiapine Fumarate) .... 2-4 Q Hs 7)  Ativan 0.5 Mg Tabs (Lorazepam) .[name].. 1 Q6 Hours As Needed  Current Medications (verified): 1)  Levothroid 175 Mcg Tabs (Levothyroxine Sodium) .[name].. 1 Once Daily 2)  Atenolol-Chlorthalidone 50-25 Mg  Tabs (Atenolol-Chlorthalidone) .... Once Daily 3)  Flonase 50 Mcg/act  Susp (Fluticasone Propionate) .... As Needed 4)  Esgic-Plus 50-500-40 Mg  Tabs (Butalbital-Apap-Caffeine) .... One By Mouth Q 6 Hours As Needed Ha 5)  Cymbalta 60 Mg Cpep (Duloxetine Hcl) .... One By Mouth Daily 6)  Ativan 0.5 Mg Tabs (Lorazepam) .[name].. 1 Q6 Hours As Needed  Allergies (verified): No Known Drug Allergies  Past History:  Social History: Last updated: 05/19/2007 Married Former Smoker  Risk Factors: Smoking Status: never (05/19/2009)  Past medical, surgical, family and social histories (including risk factors) reviewed, and no changes noted (except as noted below).  Past Medical History: Reviewed history from 05/19/2007 and no changes required.  Hypertension Hypothyroidism Allergic rhinitis  Family History: Reviewed history and no changes required.  Social History: Reviewed history from 05/19/2007 and no changes required. Married Former Smoker  Review of Systems  The patient denies anorexia, fever, weight loss, weight gain, vision loss, decreased hearing,  hoarseness, chest pain, syncope, dyspnea on exertion, peripheral edema, prolonged cough, headaches, hemoptysis, abdominal pain, melena, hematochezia, severe indigestion/heartburn, hematuria, incontinence, genital sores, muscle weakness, suspicious skin lesions, transient blindness, difficulty walking, depression, unusual weight change, abnormal bleeding, enlarged lymph nodes, angioedema, and breast masses.    Physical Exam  General:  Well-developed,well-nourished,in no acute distress; alert,appropriate and cooperative throughout examination Head:  Normocephalic and atraumatic without obvious abnormalities. No apparent alopecia or balding. Eyes:  pupils equal and pupils round.   Ears:  R ear normal and L ear normal.   Nose:  no external deformity and no nasal discharge.   Neck:  No deformities, masses, or tenderness noted. Lungs:  normal respiratory effort and no wheezes.   Heart:  normal rate and regular rhythm.     Impression & Recommendations:  Problem # 1:  ADJUSTMENT DISORDER WITH ANXIETY (ICD-309.24) resolved  Problem # 2:  TRANSIENT DISORDER INITIATING/MAINTAINING SLEEP (ICD-307.41) resolved  Problem # 3:  HYPERTENSION (ICD-401.9) stable Her updated medication list for this problem includes:    Atenolol-chlorthalidone 50-25 Mg Tabs (Atenolol-chlorthalidone) ..... Once daily  BP today: 130/80 Prior BP: 120/80 (04/06/2009)  Prior 10 Yr Risk Heart Disease: 17 % (03/20/2009)  Labs Reviewed: K+: 4.3 (05/23/2008) Creat: : 0.6 (05/23/2008)   Chol: 176 (05/23/2008)   HDL: 32.80 (05/23/2008)   LDL: 121 (05/23/2008)   TG: 111.0 (05/23/2008)  Problem # 4:  HYPOTHYROIDISM (ICD-244.9)  Her updated medication list for this problem includes:    Levothroid 175 Mcg Tabs (Levothyroxine sodium) .[name]... 1 once daily  Labs Reviewed: TSH: 0.02 (02/13/2009)    Chol: 176 (05/23/2008)   HDL: 32.80 (05/23/2008)   LDL: 121 (05/23/2008)   TG: 111.0 (05/23/2008)  Complete Medication List: 1)   Levothroid 175 Mcg Tabs (Levothyroxine sodium) .[name].. 1 once daily 2)  Atenolol-chlorthalidone 50-25 Mg Tabs (Atenolol-chlorthalidone) .... Once daily 3)  Flonase 50 Mcg/act Susp (Fluticasone propionate) .... As needed 4)  Esgic-plus 50-500-40 Mg Tabs (Butalbital-apap-caffeine) .... One by mouth q 6 hours as needed ha 5)  Cymbalta 60 Mg Cpep (Duloxetine hcl) .... One by mouth daily 6)  Ativan 0.5 Mg Tabs (Lorazepam) .[name].. 1 q6 hours as needed  Hypertension Assessment/Plan:      The patient's hypertensive risk group is category B: At least one risk factor (excluding diabetes) with no target organ damage.  Her calculated 10 year risk of coronary heart disease is 17 %.  Today's blood pressure is 130/80.  Her blood pressure goal is < 140/90.  Patient Instructions: 1)  3 month for CPX

## 2010-03-20 NOTE — Assessment & Plan Note (Signed)
Summary: roa/bmw   Vital Signs:  Patient profile:   60 year old female Height:      71 inches Weight:      166 pounds BMI:     23.24 Temp:     98.2 degrees F oral Pulse rate:   72 / minute Resp:     14 per minute BP sitting:   120 / 80  (left arm)  Vitals Entered By: Willy Eddy, LPN (April 06, 2009 1:24 PM) CC: ROA, Hypertension Management   CC:  ROA and Hypertension Management.  History of Present Illness: pt is doing "well" working for the next two weeks slight increased pressured speech low energy in the morning discussion of mood and her progress which has been "great"   Hypertension History:      She denies headache, chest pain, palpitations, dyspnea with exertion, orthopnea, PND, peripheral edema, visual symptoms, neurologic problems, syncope, and side effects from treatment.        Positive major cardiovascular risk factors include female age 58 years old or older and hypertension.  Negative major cardiovascular risk factors include non-tobacco-user status.     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) 8)  Allergic Rhinitis  (ICD-477.9) 9)  Hypothyroidism  (ICD-244.9) 10)  Hypertension  (ICD-401.9)  Current Problems (verified): 1)  Adjustment Disorder With Anxiety  (ICD-309.24) 2)  Malaise and Fatigue  (ICD-780.79) 3)  Transient Disorder Initiating/maintaining Sleep  (ICD-307.41) 4)  Nabothian Cyst  (ICD-616.0) 5)  Physical Examination  (ICD-V70.0) 6)  Neck Sprain and Strain  (ICD-847.0) 7)  Traumatic Arthropathy Pelvic Region and Thigh   (ICD-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 1 Once Daily 2)  Atenolol-Chlorthalidone 50-25 Mg  Tabs (Atenolol-Chlorthalidone) .... Once Daily 3)  Flonase 50 Mcg/act  Susp (Fluticasone Propionate) .... As Needed 4)  Esgic-Plus 50-500-40 Mg  Tabs (Butalbital-Apap-Caffeine) .... One By Mouth Q 6 Hours As Needed Ha 5)  Zoloft 50 Mg Tabs (Sertraline Hcl) .... 2 Once Daily 6)  Seroquel 25 Mg Tabs (Quetiapine Fumarate) .... 2-4 Q Hs 7)  Ativan 0.5 Mg Tabs (Lorazepam) .[name].. 1 Q6 Hours As Needed  Current Medications (verified): 1)  Levothroid 175 Mcg Tabs (Levothyroxine Sodium) .[name].. 1 Once Daily 2)  Atenolol-Chlorthalidone 50-25 Mg  Tabs (Atenolol-Chlorthalidone) .... Once Daily 3)  Flonase 50 Mcg/act  Susp (Fluticasone Propionate) .... As Needed 4)  Esgic-Plus 50-500-40 Mg  Tabs (Butalbital-Apap-Caffeine) .... One By Mouth Q 6 Hours As Needed Ha 5)  Zoloft 50 Mg Tabs (Sertraline Hcl) .... 2 Once Daily 6)  Seroquel 25 Mg Tabs (Quetiapine Fumarate) .... 2-4 Q Hs 7)  Ativan 0.5 Mg Tabs (Lorazepam) .[name].. 1 Q6 Hours As Needed  Allergies (verified): No Known Drug Allergies  Past History:  Social History: Last updated: 05/19/2007 Married Former Smoker  Risk Factors: Smoking Status: never (04/06/2009)  Past medical, surgical, family and social histories (including risk factors) reviewed, and no changes noted (except as noted below).  Past Medical History: Reviewed history from  05/19/2007 and no changes required. Hypertension Hypothyroidism Allergic rhinitis  Family History: Reviewed history and no changes required.  Social History: Reviewed history from 05/19/2007 and no changes required. Married Former Smoker  Review of Systems  The patient denies anorexia, fever, weight loss, weight gain, vision loss, decreased hearing,  hoarseness, chest pain, syncope, dyspnea on exertion, peripheral edema, prolonged cough, headaches, hemoptysis, abdominal pain, melena, hematochezia, severe indigestion/heartburn, hematuria, incontinence, genital sores, muscle weakness, suspicious skin lesions, transient blindness, difficulty walking, depression, unusual weight change, abnormal bleeding, enlarged lymph nodes, angioedema, and breast masses.    Physical Exam  General:  Well-developed,well-nourished,in no acute distress; alert,appropriate and cooperative throughout examination Head:  Normocephalic and atraumatic without obvious abnormalities. No apparent alopecia or balding. Ears:  R ear normal and L ear normal.   Neck:  No deformities, masses, or tenderness noted. Lungs:  normal respiratory effort and no wheezes.   Heart:  normal rate and regular rhythm.   Abdomen:  soft, non-tender, no masses, and no guarding.   Pulses:  R and L carotid,radial,femoral,dorsalis pedis and posterior tibial pulses are full and equal bilaterally Extremities:  No clubbing, cyanosis, edema, or deformity noted with normal full range of motion of all joints.   Neurologic:  alert & oriented X3 and strength normal in all extremities.     Impression & Recommendations:  Problem # 1:  ADJUSTMENT DISORDER WITH ANXIETY (ICD-309.24) change back cymbalta  Problem # 2:  MALAISE AND FATIGUE (ICD-780.79) stable  Problem # 3:  HYPERTENSION (ICD-401.9)  Her updated medication list for this problem includes:    Atenolol-chlorthalidone 50-25 Mg Tabs (Atenolol-chlorthalidone) ..... Once daily  BP today: 120/80 Prior BP: 134/80 (03/20/2009)  Prior 10 Yr Risk Heart Disease: 17 % (03/20/2009)  Labs Reviewed: K+: 4.3 (05/23/2008) Creat: : 0.6 (05/23/2008)   Chol: 176 (05/23/2008)   HDL: 32.80 (05/23/2008)   LDL: 121 (05/23/2008)   TG: 111.0 (05/23/2008)  Problem # 4:  HYPOTHYROIDISM (ICD-244.9)  Her updated medication list for this problem includes:     Levothroid 175 Mcg Tabs (Levothyroxine sodium) .[name]... 1 once daily  Labs Reviewed: TSH: 0.02 (02/13/2009)    Chol: 176 (05/23/2008)   HDL: 32.80 (05/23/2008)   LDL: 121 (05/23/2008)   TG: 111.0 (05/23/2008)  Complete Medication List: 1)  Levothroid 175 Mcg Tabs (Levothyroxine sodium) .[name].. 1 once daily 2)  Atenolol-chlorthalidone 50-25 Mg Tabs (Atenolol-chlorthalidone) .... Once daily 3)  Flonase 50 Mcg/act Susp (Fluticasone propionate) .... As needed 4)  Esgic-plus 50-500-40 Mg Tabs (Butalbital-apap-caffeine) .... One by mouth q 6 hours as needed ha 5)  Cymbalta 60 Mg Cpep (Duloxetine hcl) .... One by mouth daily 6)  Seroquel 25 Mg Tabs (Quetiapine fumarate) .... 2-4 q hs 7)  Ativan 0.5 Mg Tabs (Lorazepam) .[name].. 1 q6 hours as needed  Hypertension Assessment/Plan:      The patient's hypertensive risk group is category B: At least one risk factor (excluding diabetes) with no target organ damage.  Her calculated 10 year risk of coronary heart disease is 17 %.  Today's blood pressure is 120/80.  Her blood pressure goal is < 140/90.  Patient Instructions: 1)  Please schedule a follow-up appointment in 1 month.

## 2010-03-22 NOTE — Progress Notes (Signed)
Summary: new rx  Phone Note Refill Request Message from:  Patient on [phone]  Refills Requested: Medication #1:  ESGIC-PLUS 50-500-40 MG  TABS one by mouth q 6 hours as needed HA pt needs new rx  Initial call taken by: Heron Sabins,  March 06, 2010 12:15 PM    Prescriptions: ESGIC-PLUS 50-500-40 MG  TABS [facility]) one by mouth q 6 hours as needed HA  #30 x 3   Entered by:   Willy Eddy, LPN   Authorized by:   Stacie Glaze MD   Signed by:   Willy Eddy, LPN on 40/11/2723   Method used:   Electronically to        [facility] (retail)       [address]       [place], [place]  [address]       Ph: [phone]       Fax: [phone]   RxID:   [id number]

## 2010-03-22 NOTE — Assessment & Plan Note (Signed)
Summary: leg issues//ccm   Vital Signs:  Patient profile:   60 year old female Weight:      188 pounds Pulse rate:   80 / minute BP sitting:   120 / 80  (left arm)  Vitals Entered By: Kyung Rudd, CMA (February 23, 2010 8:41 AM) CC: pt c/o pain in rt ankle area when walking...pain increases when walking bare foot. Pt questions if it has something to do with vericose veins or if she may have hurt something.    Primary Care Provider:  Stacie Glaze MD  CC:  pt c/o pain in rt ankle area when walking...pain increases when walking bare foot. Pt questions if it has something to do with vericose veins or if she may have hurt something. [name]  History of Present Illness: Patient presents to clinic as a workin for evaluation of leg pain. Notes 6d h/o right lower leg pain in area of medial ankle. Is very active with exercise including tennis but does not recall specific injury/trauma. No swelling, redness, difficulty with wt bearing. Wonders if varicose veins in the vicinity may be the cause of pain. No other complaints.  Allergies: No Known Drug Allergies  Past History:  Past medical, surgical, family and social histories (including risk factors) reviewed for relevance to current acute and chronic problems.  Past Medical History: Reviewed history from 05/19/2007 and no changes required. Hypertension Hypothyroidism Allergic rhinitis  Family History: Reviewed history and no changes required.  Social History: Reviewed history from 05/19/2007 and no changes required. Married Former Smoker  Review of Systems      See HPI General:  Denies chills and fever. MS:  Denies joint pain, joint redness, joint swelling, cramps, and muscle weakness.  Physical Exam  General:  Well-developed,well-nourished,in no acute distress; alert,appropriate and cooperative throughout examination Head:  Normocephalic and atraumatic without obvious abnormalities. No apparent alopecia or balding. Eyes:   pupils equal, pupils round, corneas and lenses clear, and no injection.   Msk:  Mild tenderness to palpation right medial ankle and along medial aspect of horizontal ligament. No achilles tenderness/heel tenderness. FROM of ankle and foot. +wt bear without difficulty Pulses:  R dorsalis pedis normal.   Extremities:  No clubbing, cyanosis, edema, or deformity noted with normal full range of motion of all joints.   Skin:  Noted venous varicosities of lower extremities. No associated redness, warmth, tenderness or rash.   Impression & Recommendations:  Problem # 1:  FOOT PAIN, RIGHT (ICD-729.5) Assessment New Suspect mild ligamental sprain. Doubt contribution from varicose veins given clinical hx. May have prev prescribed nsaid at home and will begin scheduled x 5-7 days with food and no other nsaid. If does not have at home will fill precription for voltaren. Followup if no improvement or worsening.  Complete Medication List: 1)  Levothroid 175 Mcg Tabs (Levothyroxine sodium) .[name].. 1 once daily 2)  Atenolol-chlorthalidone 50-25 Mg Tabs (Atenolol-chlorthalidone) .... Once daily 3)  Flonase 50 Mcg/act Susp (Fluticasone propionate) .... As needed 4)  Esgic-plus 50-500-40 Mg Tabs (Butalbital-apap-caffeine) .... One by mouth q 6 hours as needed ha 5)  Cymbalta 60 Mg Cpep (Duloxetine hcl) .... One by mouth daily 6)  Ativan 0.5 Mg Tabs (Lorazepam) .[name].. 1 q6 hours as needed 7)  Diclofenac Sodium 75 Mg Tbec (Diclofenac sodium) .... One by mouth two times a day x 5 days then one by mouth two times a day as needed pain (take with food) Prescriptions: DICLOFENAC SODIUM 75 MG TBEC (DICLOFENAC SODIUM)  one by mouth two times a day x 5 days then one by mouth two times a day as needed pain (take with food)  #30 x 0   Entered and Authorized by:   Edwyna Perfect MD   Signed by:   Edwyna Perfect MD on 02/23/2010   Method used:   Print then Give to Patient   RxID:   [id number]    Orders Added: 1)   Est. Patient Level III [66440]

## 2010-03-22 NOTE — Assessment & Plan Note (Signed)
Summary: 3 month ov//ccm   Vital Signs:  Patient profile:   60 year old female Height:      71 inches Weight:      184 pounds BMI:     25.76 Temp:     99.9 degrees F oral Pulse rate:   88 / minute Resp:     14 per minute BP sitting:   130 / 70  (left arm)  Vitals Entered By: Willy Eddy, LPN (January 19, 2010 8:34 AM) CC: roa labs, Hypertension Management Is Patient Diabetic? No   Primary Care Provider:  Stacie Glaze MD  CC:  roa labs and Hypertension Management.  History of Present Illness: has a HA and fever with diarrhea and nausea and vomiting   Hypertension History:      She denies headache, chest pain, palpitations, dyspnea with exertion, orthopnea, PND, peripheral edema, visual symptoms, neurologic problems, syncope, and side effects from treatment.        Positive major cardiovascular risk factors include female age 60 years old or older and hypertension.  Negative major cardiovascular risk factors include non-tobacco-user status.     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 1 Once Daily 2)  Atenolol-Chlorthalidone 50-25 Mg  Tabs (Atenolol-Chlorthalidone) .... Once Daily 3)  Flonase 50 Mcg/act  Susp (Fluticasone Propionate) .... As Needed 4)  Esgic-Plus 50-500-40 Mg  Tabs (Butalbital-Apap-Caffeine) .... One By Mouth Q 6 Hours As Needed Ha 5)   Cymbalta 60 Mg Cpep (Duloxetine Hcl) .... One By Mouth Daily 6)  Ativan 0.5 Mg Tabs (Lorazepam) .[name].. 1 Q6 Hours As Needed  Current Medications (verified): 1)  Levothroid 175 Mcg Tabs (Levothyroxine Sodium) .[name].. 1 Once Daily 2)  Atenolol-Chlorthalidone 50-25 Mg  Tabs (Atenolol-Chlorthalidone) .... Once Daily 3)  Flonase 50 Mcg/act  Susp (Fluticasone Propionate) .... As Needed 4)  Esgic-Plus 50-500-40 Mg  Tabs (Butalbital-Apap-Caffeine) .... One By Mouth Q 6 Hours As Needed Ha 5)  Cymbalta 60 Mg Cpep (Duloxetine Hcl) .... One By Mouth Daily 6)  Ativan 0.5 Mg Tabs (Lorazepam) .[name].. 1 Q6 Hours As Needed  Allergies (verified): No Known Drug Allergies  Past History:  Social History: Last updated: 05/19/2007 Married Former Smoker  Risk Factors: Smoking Status: never (01/19/2010)  Past medical, surgical, family and social histories (including risk factors) reviewed, and no changes noted (except as noted below).  Past Medical History: Reviewed history from 05/19/2007 and no changes required. Hypertension Hypothyroidism Allergic rhinitis  Family History: Reviewed history and no changes required.  Social History: Reviewed history from 05/19/2007 and no changes required. Married Former Smoker  Review of Systems  The patient denies anorexia, fever, weight loss, weight gain, vision loss, decreased hearing, hoarseness, chest pain, syncope, dyspnea on exertion, peripheral edema, prolonged cough, headaches, hemoptysis, abdominal pain, melena, hematochezia, severe indigestion/heartburn, hematuria, incontinence, genital sores, muscle weakness, suspicious skin lesions, transient blindness, difficulty walking, depression, unusual weight change, abnormal bleeding, enlarged lymph  nodes, angioedema, and breast masses.    Physical Exam  General:  Well-developed,well-nourished,in no acute distress; alert,appropriate and cooperative throughout examination Head:  Normocephalic and atraumatic  without obvious abnormalities. No apparent alopecia or balding. Eyes:  pupils equal and pupils round.   Ears:  R ear normal and L ear normal.   Nose:  no external deformity and no nasal discharge.   Neck:  No deformities, masses, or tenderness noted. Lungs:  Normal respiratory effort, chest expands symmetrically. Lungs are clear to auscultation, no crackles or wheezes. Heart:  Normal rate and regular rhythm. S1 and S2 normal without gallop, murmur, click, rub or other extra sounds. Abdomen:  soft, non-tender, no masses, and no guarding.     Impression & Recommendations:  Problem # 1:  ACUTE BRONCHITIS (ICD-466.0)  Take antibiotics and other medications as directed. Encouraged to push clear liquids, get enough rest, and take acetaminophen as needed. To be seen in 5-7 days if no improvement, sooner if worse.  Problem # 2:  GASTROENTERITIS, ACUTE (ICD-558.9)  Discussed use of medication and role of diet. Encouraged clear liquids and electrolyte replacement fluids. Instructed to call if any signs of worsening dehydration.   Complete Medication List: 1)  Levothroid 175 Mcg Tabs (Levothyroxine sodium) .[name].. 1 once daily 2)  Atenolol-chlorthalidone 50-25 Mg Tabs (Atenolol-chlorthalidone) .... Once daily 3)  Flonase 50 Mcg/act Susp (Fluticasone propionate) .... As needed 4)  Esgic-plus 50-500-40 Mg Tabs (Butalbital-apap-caffeine) .... One by mouth q 6 hours as needed ha 5)  Cymbalta 60 Mg Cpep (Duloxetine hcl) .... One by mouth daily 6)  Ativan 0.5 Mg Tabs (Lorazepam) .[name].. 1 q6 hours as needed  Hypertension Assessment/Plan:      The patient's hypertensive risk group is category B: At least one risk factor (excluding diabetes) with no target organ damage.  Her calculated 10 year risk of coronary heart disease is 11 %.  Today's blood pressure is 130/70.  Her blood pressure goal is < 140/90.  Patient Instructions: 1)  soup and clear liguids for 48-72 hours 2)  theraflu or 3)  thyroid is at  goal 4)  Please schedule a follow-up appointment in 4-5  months.   Orders Added: 1)  Est. Patient Level III [16109]

## 2010-05-06 LAB — DIFFERENTIAL
Basophils Absolute: 0.1 10*3/uL (ref 0.0–0.1)
Basophils Relative: 1 % (ref 0–1)
Eosinophils Absolute: 0 10*3/uL (ref 0.0–0.7)
Eosinophils Relative: 0 % (ref 0–5)
Lymphocytes Relative: 19 % (ref 12–46)
Lymphs Abs: 1.7 10*3/uL (ref 0.7–4.0)
Monocytes Absolute: 0.7 10*3/uL (ref 0.1–1.0)
Monocytes Relative: 8 % (ref 3–12)
Neutro Abs: 6.3 10*3/uL (ref 1.7–7.7)
Neutrophils Relative %: 71 % (ref 43–77)

## 2010-05-06 LAB — RAPID URINE DRUG SCREEN, HOSP PERFORMED
Amphetamines: NOT DETECTED
Barbiturates: NOT DETECTED
Benzodiazepines: NOT DETECTED
Cocaine: NOT DETECTED
Opiates: NOT DETECTED
Tetrahydrocannabinol: NOT DETECTED

## 2010-05-06 LAB — BASIC METABOLIC PANEL
BUN: 7 mg/dL (ref 6–23)
CO2: 27 mEq/L (ref 19–32)
Calcium: 9.1 mg/dL (ref 8.4–10.5)
Chloride: 96 mEq/L (ref 96–112)
Creatinine, Ser: 0.69 mg/dL (ref 0.4–1.2)
GFR calc Af Amer: >60 mL/min (ref >60)
GFR calc non Af Amer: >60 mL/min (ref >60)
Glucose, Bld: 116 mg/dL — ABNORMAL HIGH (ref 70–99)
Potassium: 3.1 mEq/L — ABNORMAL LOW (ref 3.5–5.1)
Sodium: 134 mEq/L — ABNORMAL LOW (ref 135–145)

## 2010-05-06 LAB — T4, FREE
Free T4: 2.09 ng/dL — ABNORMAL HIGH (ref 0.80–1.80)
Free T4: 2.18 ng/dL — ABNORMAL HIGH (ref 0.80–1.80)

## 2010-05-06 LAB — CBC
HCT: 45.2 % (ref 36.0–46.0)
Hemoglobin: 15.5 g/dL — ABNORMAL HIGH (ref 12.0–15.0)
MCHC: 34.3 g/dL (ref 30.0–36.0)
MCV: 87.9 fL (ref 78.0–100.0)
Platelets: 304 10*3/uL (ref 150–400)
RBC: 5.15 MIL/uL — ABNORMAL HIGH (ref 3.87–5.11)
RDW: 12.4 % (ref 11.5–15.5)
WBC: 8.9 10*3/uL (ref 4.0–10.5)

## 2010-05-06 LAB — LIPID PANEL
Cholesterol: 144 mg/dL (ref 0–200)
HDL: 46 mg/dL (ref >39)
LDL Cholesterol: 79 mg/dL (ref 0–99)
Total CHOL/HDL Ratio: 3.1 RATIO

## 2010-05-06 LAB — ETHANOL: Alcohol, Ethyl (B): <5 mg/dL (ref 0–10)

## 2010-05-06 LAB — TSH
TSH: 0.009 u[IU]/mL — ABNORMAL LOW (ref 0.350–4.500)
TSH: 0.012 u[IU]/mL — ABNORMAL LOW (ref 0.350–4.500)

## 2010-05-28 ENCOUNTER — Other Ambulatory Visit: Payer: Self-pay | Admitting: Internal Medicine

## 2010-06-27 ENCOUNTER — Other Ambulatory Visit: Payer: Self-pay | Admitting: Internal Medicine

## 2010-06-27 DIAGNOSIS — Z1231 Encounter for screening mammogram for malignant neoplasm of breast: Secondary | ICD-10-CM

## 2010-07-13 ENCOUNTER — Ambulatory Visit
Admission: RE | Admit: 2010-07-13 | Discharge: 2010-07-13 | Disposition: A | Discharge status: Home / Self Care | Payer: BC Managed Care – PPO | Source: Ambulatory Visit | Attending: Internal Medicine | Admitting: Internal Medicine

## 2010-07-13 DIAGNOSIS — Z1231 Encounter for screening mammogram for malignant neoplasm of breast: Secondary | ICD-10-CM

## 2010-08-14 ENCOUNTER — Ambulatory Visit (INDEPENDENT_AMBULATORY_CARE_PROVIDER_SITE_OTHER): Payer: BC Managed Care – PPO | Admitting: Internal Medicine

## 2010-08-14 ENCOUNTER — Encounter: Payer: Self-pay | Admitting: Internal Medicine

## 2010-08-14 DIAGNOSIS — M25569 Pain in unspecified knee: Secondary | ICD-10-CM

## 2010-08-14 DIAGNOSIS — M25561 Pain in right knee: Secondary | ICD-10-CM

## 2010-08-14 MED ORDER — DICLOFENAC SODIUM 75 MG PO TBEC: DELAYED_RELEASE_TABLET | ORAL | Status: AC

## 2010-08-19 DIAGNOSIS — M25561 Pain in right knee: Secondary | ICD-10-CM | POA: Insufficient documentation

## 2010-08-19 NOTE — Progress Notes (Signed)
  Subjective:    Patient ID: Theresa Perez, female    DOB: [birth year], 60 y.o.   MRN: [id number]  Knee Pain    Patient presents to clinic for evaluation of knee pain. Notes one-week history of knee pain primarily in the popliteal area with mild radiation to the upper calf. No leg/calf swelling, erythema warmth or significant pain. No risks for thromboembolic disease. There's been no injury or trauma or specific trigger. Spontaneously has improved within the past 24 hours. No leg instability or falls. No other alleviating or exacerbating factors. Taking no medication for this problem. No other complaints.  Reviewed past medical history, medications and allergies    Review of Systems see history of present illness     Objective:   Physical Exam  Nursing note and vitals reviewed. Constitutional: She appears well-developed and well-nourished. No distress.  HENT:  Head: Normocephalic and atraumatic.  Eyes: Conjunctivae are normal. No scleral icterus.  Musculoskeletal:       Full range of motion right knee. No erythema effusion or warmth. Mild tender to palpation popliteal area which is mildly prominent. No calf swelling warmth erythema or palpable cords. Able to weight and ambulate without assistance or difficulty  Neurological: She is alert.  Skin: Skin is warm and dry. No rash noted. She is not diaphoretic. No erythema.  Psychiatric: She has a normal mood and affect.          Assessment & Plan:

## 2010-08-19 NOTE — Assessment & Plan Note (Signed)
Consider possible Baker's cyst. Suspicion for DVT very low. Discussed leg ultrasound to evaluate for Baker's cyst however unlikely to effect treatment. Patient agrees to proceed with empiric treatment with Voltaren. Take with food and no other anti-inflammatories.Followup if no improvement or worsening.

## 2010-08-30 ENCOUNTER — Encounter: Payer: Self-pay | Admitting: Internal Medicine

## 2010-08-30 ENCOUNTER — Ambulatory Visit (INDEPENDENT_AMBULATORY_CARE_PROVIDER_SITE_OTHER): Payer: BC Managed Care – PPO | Admitting: Internal Medicine

## 2010-08-30 VITALS — BP 130/80 | HR 72 | Temp 98.2°F | Resp 16 | Ht 71.0 in | Wt 190.0 lb

## 2010-08-30 DIAGNOSIS — M1710 Unilateral primary osteoarthritis, unspecified knee: Secondary | ICD-10-CM

## 2010-08-30 DIAGNOSIS — M7120 Synovial cyst of popliteal space [Baker], unspecified knee: Secondary | ICD-10-CM

## 2010-08-30 DIAGNOSIS — M25561 Pain in right knee: Secondary | ICD-10-CM

## 2010-08-30 NOTE — Patient Instructions (Signed)
Call in the next day to report if the knee pain is better you should hear from my office about scheduling an ultrasound of the knee tomorrow

## 2010-08-30 NOTE — Assessment & Plan Note (Signed)
Informed consent obtained and the patient'  Right knee joint was cleaned with Betadine local anesthesia obtained with topical spray 40 mg of Depo-Medrol and 1/2 cc of lidocaine was injected into the joint space the patient tolerated the injection well post injection care discussed with patient ice placed  An [place] was ordered

## 2010-08-30 NOTE — Progress Notes (Signed)
  Subjective:    Patient ID: Theresa Perez, female    DOB: Sep 27, 1950, 60 y.o.   MRN: [id number]  HPI Patient is a 60 year old white female with a history of degenerative joint disease in her knees bilaterally. in the past she has been on glucosamine chondritin. She saw my partner for evaluation of her knee they discussed degenerative knee disease and possible Baker cyst etiology. Today she presents for consideration of injection her knee.  She has a history of hypertension that is well-controlled. She denies any erythema rashes or swelling to the knee joint other than the information described in the history of present illness    Review of Systems  Constitutional: Negative for activity change, appetite change and fatigue.  HENT: Negative for ear pain, congestion, neck pain, postnasal drip and sinus pressure.   Eyes: Negative for redness and visual disturbance.  Respiratory: Negative for cough, shortness of breath and wheezing.   Gastrointestinal: Negative for abdominal pain and abdominal distention.  Genitourinary: Negative for dysuria, frequency and menstrual problem.  Musculoskeletal: Negative for myalgias, joint swelling and arthralgias.  Skin: Negative for rash and wound.  Neurological: Negative for dizziness, weakness and headaches.  Hematological: Negative for adenopathy. Does not bruise/bleed easily.  Psychiatric/Behavioral: Negative for sleep disturbance and decreased concentration.       Objective:   Physical Exam  Constitutional: She is oriented to person, place, and time. She appears well-developed and well-nourished. No distress.  HENT:  Head: Normocephalic and atraumatic.  Right Ear: External ear normal.  Left Ear: External ear normal.  Nose: Nose normal.  Mouth/Throat: Oropharynx is clear and moist.  Eyes: Conjunctivae and EOM are normal. Pupils are equal, round, and reactive to light.  Neck: Normal range of motion. Neck supple. No JVD present. No tracheal  deviation present. No thyromegaly present.  Cardiovascular: Normal rate, regular rhythm, normal heart sounds and intact distal pulses.   No murmur heard. Pulmonary/Chest: Effort normal and breath sounds normal. She has no wheezes. She exhibits no tenderness.  Abdominal: Soft. Bowel sounds are normal.  Musculoskeletal: Normal range of motion. She exhibits no edema and no tenderness.  Lymphadenopathy:    She has no cervical adenopathy.  Neurological: She is alert and oriented to person, place, and time. She has normal reflexes. No cranial nerve deficit.  Skin: Skin is warm and dry. She is not diaphoretic.  Psychiatric: She has a normal mood and affect. Her behavior is normal.          Assessment & Plan:  Blood pressure was stable  Informed consent was obtained the knee was prepped with Betadine and using local anesthesia for a numbing effect and a injection of 1/2 cc of 1% lidocaine and 40 mg of Depo-Medrol was placed into the joint space.  Ice was applied after the procedure the patient was given post procedural care if her symptoms persist referral to an orthopedist for an MRI of the knee would be the next

## 2010-08-31 ENCOUNTER — Other Ambulatory Visit: Payer: Self-pay | Admitting: Internal Medicine

## 2010-08-31 ENCOUNTER — Telehealth: Payer: Self-pay | Admitting: *Deleted

## 2010-08-31 ENCOUNTER — Ambulatory Visit
Admission: RE | Admit: 2010-08-31 | Discharge: 2010-08-31 | Disposition: A | Discharge status: Home / Self Care | Payer: BC Managed Care – PPO | Source: Ambulatory Visit | Attending: Internal Medicine | Admitting: Internal Medicine

## 2010-08-31 DIAGNOSIS — M25561 Pain in right knee: Secondary | ICD-10-CM

## 2010-08-31 DIAGNOSIS — M1710 Unilateral primary osteoarthritis, unspecified knee: Secondary | ICD-10-CM

## 2010-08-31 DIAGNOSIS — M7120 Synovial cyst of popliteal space [Baker], unspecified knee: Secondary | ICD-10-CM

## 2010-08-31 NOTE — Telephone Encounter (Signed)
ddr Theresa Perez is aware

## 2010-08-31 NOTE — Telephone Encounter (Signed)
The cortisone shot has helped, U/S done at 3 today

## 2010-09-12 ENCOUNTER — Telehealth: Payer: Self-pay | Admitting: Internal Medicine

## 2010-09-12 NOTE — Telephone Encounter (Signed)
Requesting ultrasound results of her right knee from 2 weeks ago.

## 2010-09-13 NOTE — Telephone Encounter (Signed)
Pt informed abut states she played tennis again and knee is more painful now thatn before- requesting to got odr [facility] and she will make own appointment-- will fax ultrasound of kn ee

## 2010-09-21 MED ORDER — METHYLPREDNISOLONE ACETATE 40 MG/ML IJ SUSP: 40.0000 mg | Freq: Once | INTRAMUSCULAR | Status: DC

## 2010-10-10 ENCOUNTER — Other Ambulatory Visit (INDEPENDENT_AMBULATORY_CARE_PROVIDER_SITE_OTHER): Payer: BC Managed Care – PPO

## 2010-10-10 DIAGNOSIS — Z0000 Encounter for general adult medical examination without abnormal findings: Secondary | ICD-10-CM

## 2010-10-10 LAB — HEPATIC FUNCTION PANEL
ALT: 27 U/L (ref 0–35)
AST: 26 U/L (ref 0–37)
Alkaline Phosphatase: 64 U/L (ref 39–117)
Bilirubin, Direct: 0.1 mg/dL (ref 0.0–0.3)
Total Bilirubin: 0.6 mg/dL (ref 0.3–1.2)

## 2010-10-10 LAB — CBC WITH DIFFERENTIAL/PLATELET
Basophils Absolute: 0 10*3/uL (ref 0.0–0.1)
Eosinophils Relative: 2.3 % (ref 0.0–5.0)
HCT: 41.9 % (ref 36.0–46.0)
Lymphocytes Relative: 27.9 % (ref 12.0–46.0)
Monocytes Relative: 9.2 % (ref 3.0–12.0)
Platelets: 287 10*3/uL (ref 150.0–400.0)
RDW: 13.1 % (ref 11.5–14.6)
WBC: 6.9 10*3/uL (ref 4.5–10.5)

## 2010-10-10 LAB — BASIC METABOLIC PANEL
BUN: 13 mg/dL (ref 6–23)
Calcium: 9.1 mg/dL (ref 8.4–10.5)
GFR: 89.55 mL/min (ref >60.00)
Glucose, Bld: 88 mg/dL (ref 70–99)
Potassium: 4.9 mEq/L (ref 3.5–5.1)
Sodium: 143 mEq/L (ref 135–145)

## 2010-10-10 LAB — TSH: TSH: 0.04 u[IU]/mL — ABNORMAL LOW (ref 0.35–5.50)

## 2010-10-10 LAB — LIPID PANEL
LDL Cholesterol: 124 mg/dL — ABNORMAL HIGH (ref 0–99)
Total CHOL/HDL Ratio: 4
VLDL: 14.8 mg/dL (ref 0.0–40.0)

## 2010-10-15 ENCOUNTER — Other Ambulatory Visit: Payer: Self-pay | Admitting: Internal Medicine

## 2010-10-17 ENCOUNTER — Encounter: Payer: Self-pay | Admitting: Internal Medicine

## 2010-10-17 ENCOUNTER — Other Ambulatory Visit (HOSPITAL_COMMUNITY)
Admission: RE | Admit: 2010-10-17 | Discharge: 2010-10-17 | Disposition: A | Discharge status: Home / Self Care | Payer: BC Managed Care – PPO | Source: Ambulatory Visit | Attending: Internal Medicine | Admitting: Internal Medicine

## 2010-10-17 ENCOUNTER — Ambulatory Visit (INDEPENDENT_AMBULATORY_CARE_PROVIDER_SITE_OTHER): Payer: BC Managed Care – PPO | Admitting: Internal Medicine

## 2010-10-17 DIAGNOSIS — N72 Inflammatory disease of cervix uteri: Secondary | ICD-10-CM

## 2010-10-17 DIAGNOSIS — N941 Unspecified dyspareunia: Secondary | ICD-10-CM

## 2010-10-17 DIAGNOSIS — N888 Other specified noninflammatory disorders of cervix uteri: Secondary | ICD-10-CM

## 2010-10-17 DIAGNOSIS — M1710 Unilateral primary osteoarthritis, unspecified knee: Secondary | ICD-10-CM

## 2010-10-17 DIAGNOSIS — Z0000 Encounter for general adult medical examination without abnormal findings: Secondary | ICD-10-CM

## 2010-10-17 DIAGNOSIS — IMO0002 Reserved for concepts with insufficient information to code with codable children: Secondary | ICD-10-CM

## 2010-10-17 DIAGNOSIS — Z01419 Encounter for gynecological examination (general) (routine) without abnormal findings: Secondary | ICD-10-CM | POA: Insufficient documentation

## 2010-10-17 NOTE — Progress Notes (Signed)
Addended by: Willy Eddy on: 10/17/2010 03:29 PM   Modules accepted: Orders

## 2010-10-17 NOTE — Progress Notes (Signed)
Subjective:    Patient ID: Theresa Perez, female    DOB: 05/27/1950, 60 y.o.   MRN: [id number]  HPI Patient has partially torn anterior cruciate ligament on the right leg and a probable ruptured Baker's cyst that is resolving ultrasound did not show Baker's cyst but MRI did suggest a Baker's cyst subsequent ultrasound did show some streaking in the soft tissue that may have been extravasation of fluid from a ruptured Baker cyst She experiences some dyspareunia with pain in the area of the bladder. She has no symptoms of hypothyroidism her blood pressure is well controlled   Review of Systems  Constitutional: Negative for activity change, appetite change and fatigue.  HENT: Negative for ear pain, congestion, neck pain, postnasal drip and sinus pressure.   Eyes: Negative for redness and visual disturbance.  Respiratory: Negative for cough, shortness of breath and wheezing.   Gastrointestinal: Negative for abdominal pain and abdominal distention.  Genitourinary: Negative for dysuria, frequency and menstrual problem.  Musculoskeletal: Negative for myalgias, joint swelling and arthralgias.  Skin: Negative for rash and wound.  Neurological: Negative for dizziness, weakness and headaches.  Hematological: Negative for adenopathy. Does not bruise/bleed easily.  Psychiatric/Behavioral: Negative for sleep disturbance and decreased concentration.   Past Medical History  Diagnosis Date  . Hypertension   . Hypothyroidism   . Allergy    No past surgical history on file.  reports that she has been smoking.  She does not have any smokeless tobacco history on file. She reports that she does not drink alcohol or use illicit drugs. family history is not on file. No Known Allergies     Objective:   Physical Exam  Nursing note and vitals reviewed. Constitutional: She is oriented to person, place, and time. She appears well-developed and well-nourished. No distress.  HENT:  Head: Normocephalic and  atraumatic.  Right Ear: External ear normal.  Left Ear: External ear normal.  Nose: Nose normal.  Mouth/Throat: Oropharynx is clear and moist.  Eyes: Conjunctivae and EOM are normal. Pupils are equal, round, and reactive to light.  Neck: Normal range of motion. Neck supple. No JVD present. No tracheal deviation present. No thyromegaly present.  Cardiovascular: Normal rate, regular rhythm, normal heart sounds and intact distal pulses.   No murmur heard. Pulmonary/Chest: Effort normal and breath sounds normal. She has no wheezes. She exhibits no tenderness.  Abdominal: Soft. Bowel sounds are normal.  Musculoskeletal: Normal range of motion. She exhibits no edema and no tenderness.  Lymphadenopathy:    She has no cervical adenopathy.  Neurological: She is alert and oriented to person, place, and time. She has normal reflexes. No cranial nerve deficit.  Skin: Skin is warm and dry. She is not diaphoretic.  Psychiatric: She has a normal mood and affect. Her behavior is normal.          Assessment & Plan:   This is a routine physical examination for this healthy  Female. Reviewed all health maintenance protocols including mammography colonoscopy bone density and reviewed appropriate screening labs. Her immunization history was reviewed as well as her current medications and allergies refills of her chronic medications were given and the plan for yearly health maintenance was discussed all orders and referrals were made as appropriate. Patient is doing well from the standpoint of her annual physical examination all blood work was normal.  She does have the degenerative joint disease of her knees with the anterior cruciate ligament tendon injury she states that she wishes to appropriate pursue  physical therapy for this and not surgical at this point so no referral will be made she is given a list of knee exercises that she can participate in.  Her thyroid is stable hypertension is stable her mood  is stable.  We also addressed a nabothian cyst but she is seen on previous exams which appears to have resolved we talked about some dyspareunia she had deep placement and talked about ways to accommodate sexual activity without pressure on her bladder.

## 2010-10-23 ENCOUNTER — Telehealth: Payer: Self-pay | Admitting: Internal Medicine

## 2010-10-23 MED ORDER — LEVOTHYROXINE SODIUM 175 MCG PO TABS: 175.0000 ug | ORAL_TABLET | Freq: Every day | ORAL | Status: DC

## 2010-10-23 NOTE — Telephone Encounter (Signed)
Pt's prescriptions were called in on 8/27 to [facility] on [address] . Pt checked with [facility] and there is no prescription there. Please resend Levothyroxine to [facility].

## 2010-11-21 ENCOUNTER — Other Ambulatory Visit: Payer: Self-pay | Admitting: Internal Medicine

## 2010-11-21 MED ORDER — ATENOLOL-CHLORTHALIDONE 50-25 MG PO TABS: 1.0000 | ORAL_TABLET | Freq: Every day | ORAL | Status: DC

## 2010-11-21 MED ORDER — LEVOTHYROXINE SODIUM 175 MCG PO TABS: 175.0000 ug | ORAL_TABLET | Freq: Every day | ORAL | Status: DC

## 2010-11-21 NOTE — Telephone Encounter (Signed)
Sent in

## 2010-11-21 NOTE — Telephone Encounter (Signed)
Pt called req 90 day supply for levothyroxine (SYNTHROID, LEVOTHROID) 175 MCG tablet and atenolol-chlorthalidone (TENORETIC) 50-25 MG per tablet to [facility] on [address].

## 2010-12-07 ENCOUNTER — Telehealth: Payer: Self-pay | Admitting: Internal Medicine

## 2010-12-07 DIAGNOSIS — N63 Unspecified lump in unspecified breast: Secondary | ICD-10-CM

## 2010-12-07 NOTE — Telephone Encounter (Signed)
Pt called and said that she came in to see Dr Lovell Sheehan re: lump in rt breast. Dr did not seem to think that it was anything to worry about, but the lump in rt breast is ? Getting larger and also there is hard hard spot on mid abd. Pt req to get order to xrays done.

## 2010-12-11 NOTE — Telephone Encounter (Signed)
Ordered rt breast ultrasound for lump

## 2010-12-12 ENCOUNTER — Other Ambulatory Visit: Payer: Self-pay | Admitting: Internal Medicine

## 2010-12-18 ENCOUNTER — Other Ambulatory Visit: Payer: Self-pay | Admitting: *Deleted

## 2010-12-18 DIAGNOSIS — N63 Unspecified lump in unspecified breast: Secondary | ICD-10-CM

## 2010-12-27 ENCOUNTER — Telehealth: Payer: Self-pay | Admitting: Internal Medicine

## 2010-12-27 NOTE — Telephone Encounter (Signed)
Pt called and said that she had called back in October re: lump in breast and also hard spot in abd on rt side. Pt said that Dr Lovell Sheehan had said that this was where large and small intestines join together. Pt is still having problems and sensitivity, also constipation. Pt is wondering is she should get a referral to GI doctor or does pt need to sch ov to discuss?

## 2010-12-27 NOTE — Telephone Encounter (Signed)
Per dr Lovell Sheehan- may have referral to [facility]

## 2010-12-27 NOTE — Telephone Encounter (Signed)
Left message on machine for pt and referral sent

## 2010-12-28 ENCOUNTER — Encounter: Payer: Self-pay | Admitting: Gastroenterology

## 2010-12-31 ENCOUNTER — Ambulatory Visit
Admission: RE | Admit: 2010-12-31 | Discharge: 2010-12-31 | Disposition: A | Discharge status: Home / Self Care | Payer: BC Managed Care – PPO | Source: Ambulatory Visit | Attending: Internal Medicine | Admitting: Internal Medicine

## 2010-12-31 ENCOUNTER — Other Ambulatory Visit: Payer: Self-pay | Admitting: Internal Medicine

## 2010-12-31 DIAGNOSIS — N63 Unspecified lump in unspecified breast: Secondary | ICD-10-CM

## 2011-01-07 ENCOUNTER — Other Ambulatory Visit: Payer: BC Managed Care – PPO

## 2011-01-17 ENCOUNTER — Encounter: Payer: Self-pay | Admitting: Gastroenterology

## 2011-01-17 ENCOUNTER — Ambulatory Visit (INDEPENDENT_AMBULATORY_CARE_PROVIDER_SITE_OTHER): Payer: BC Managed Care – PPO | Admitting: Gastroenterology

## 2011-01-17 VITALS — BP 120/68 | HR 72 | Ht 71.0 in | Wt 192.4 lb

## 2011-01-17 DIAGNOSIS — R1033 Periumbilical pain: Secondary | ICD-10-CM

## 2011-01-17 DIAGNOSIS — Z1211 Encounter for screening for malignant neoplasm of colon: Secondary | ICD-10-CM

## 2011-01-17 DIAGNOSIS — K5900 Constipation, unspecified: Secondary | ICD-10-CM

## 2011-01-17 MED ORDER — PEG-KCL-NACL-NASULF-NA ASC-C 100 G PO SOLR: 1.0000 | Freq: Once | ORAL | Status: DC

## 2011-01-17 NOTE — Patient Instructions (Signed)
You have been scheduled for a Colonoscopy with propofol. See separate instructions.  Pick up your prep kit from your pharmacy.  High Fiber diet given.  You have been scheduled for a CT scan of the abdomen and pelvis at [facility] ([address]---this is in the same building as [facility]).   You are scheduled on 01/25/11 at 3:00pm. You should arrive 15 minutes prior to your appointment time for registration. Please follow the written instructions below on the day of your exam:  WARNING: IF YOU ARE ALLERGIC TO IODINE/X-RAY DYE, PLEASE NOTIFY RADIOLOGY IMMEDIATELY AT [phone]! YOU WILL BE GIVEN A 13 HOUR PREMEDICATION PREP.  1) Do not eat or drink anything after 11:00am (4 hours prior to your test) 2) You have been given 2 bottles of oral contrast to drink. The solution may taste               better if refrigerated, but do NOT add ice or any other liquid to this solution. Shake             well before drinking.    Drink 1 bottle of contrast @ 1:00pm (2 hours prior to your exam)  Drink 1 bottle of contrast @ 2:00pm (1 hour prior to your exam)  You may take any medications as prescribed with a small amount of water except for the following: Metformin, Glucophage, Glucovance, Avandamet, Riomet, Fortamet, Actoplus Met, Janumet, Glumetza or Metaglip. The above medications must be held the day of the exam AND 48 hours after the exam.  The purpose of you drinking the oral contrast is to aid in the visualization of your intestinal tract. The contrast solution may cause some diarrhea. Before your exam is started, you will be given a small amount of fluid to drink. Depending on your individual set of symptoms, you may also receive an intravenous injection of x-ray contrast/dye. Plan on being at [facility] for 30 minutes or long, depending on the type of exam you are having performed.  If you have any questions regarding your exam or if you need to reschedule, you may call  the CT department at [phone] between the hours of 8:00 am and 5:00 pm, Monday-Friday.  ________________________________________________________________________  cc: Darryll Capers, MD

## 2011-01-17 NOTE — Progress Notes (Signed)
History of Present Illness: This is a 60 year old female who complains of right periumbilical abdominal pain. Her symptoms have been present for about one year and bother her frequently. She can always find the area on palpation and elicit the symptoms. Does not appear to be related to any digestive function or movement. She has noted mild constipation over the past 1 to 2 years with small pellet-like stools and some difficulty in evacuating bowel movements. These symptoms have improved but not resolved with increased fiber intake. She has not previously had a colonoscopy. Denies weight loss,  diarrhea, change in stool caliber, melena, hematochezia, nausea, vomiting, dysphagia, reflux symptoms, chest pain.  Review of Systems: Pertinent positive and negative review of systems were noted in the above HPI section. All other review of systems were otherwise negative.  Current Medications, Allergies, Past Medical History, Past Surgical History, Family History and Social History were reviewed in [employer] record.  Physical Exam: General: Well developed , well nourished, no acute distress Head: Normocephalic and atraumatic Eyes:  sclerae anicteric, EOMI Ears: Normal auditory acuity Mouth: No deformity or lesions Neck: Supple, no masses or thyromegaly Lungs: Clear throughout to auscultation Heart: Regular rate and rhythm; no murmurs, rubs or bruits Abdomen: Soft, minimal tenderness to palpation and a several centimeter area in the right periumbilical area, without rebound or guarding and non distended. No masses, hepatosplenomegaly or hernias noted. Normal Bowel sounds Rectal: Deferred to colonoscopy  Musculoskeletal: Symmetrical with no gross deformities  Skin: No lesions on visible extremities Pulses:  Normal pulses noted Extremities: No clubbing, cyanosis, edema or deformities noted Neurological: Alert oriented x 4, grossly nonfocal Cervical Nodes:  No significant cervical  adenopathy Inguinal Nodes: No significant inguinal adenopathy Psychological:  Alert and cooperative. Normal mood and affect  Assessment and Recommendations:  1. Right-sided periumbilical abdominal pain, mild constipation, colorectal cancer screening. Rule out occult neoplasm. Patient had recent blood work and a physical exam, including a pelvic exam by Dr. Lovell Sheehan per her report, however I do not see a pelvic exam documented in his last office note. Increase water and fiber intake. Schedule CT scan of the abdomen/pelvis and colonoscopy. The risks, benefits, and alternatives to colonoscopy with possible biopsy and possible polypectomy were discussed with the patient and they consent to proceed.

## 2011-01-18 ENCOUNTER — Ambulatory Visit: Payer: BC Managed Care – PPO | Admitting: Internal Medicine

## 2011-01-21 ENCOUNTER — Ambulatory Visit (INDEPENDENT_AMBULATORY_CARE_PROVIDER_SITE_OTHER): Payer: BC Managed Care – PPO | Admitting: Internal Medicine

## 2011-01-21 ENCOUNTER — Encounter: Payer: Self-pay | Admitting: Internal Medicine

## 2011-01-21 DIAGNOSIS — I10 Essential (primary) hypertension: Secondary | ICD-10-CM

## 2011-01-21 DIAGNOSIS — T887XXA Unspecified adverse effect of drug or medicament, initial encounter: Secondary | ICD-10-CM

## 2011-01-21 DIAGNOSIS — F4322 Adjustment disorder with anxiety: Secondary | ICD-10-CM

## 2011-01-21 DIAGNOSIS — E039 Hypothyroidism, unspecified: Secondary | ICD-10-CM

## 2011-01-21 NOTE — Patient Instructions (Signed)
The patient is instructed to continue all medications as prescribed. Schedule followup with check out clerk upon leaving the clinic  

## 2011-01-21 NOTE — Progress Notes (Signed)
Subjective:    Patient ID: Theresa Perez, female    DOB: 1950/10/24, 60 y.o.   MRN: [id number]  HPI The pts blood pressure is stable HTN stable Benign breast cyst Med check today    Review of Systems  Constitutional: Negative for activity change, appetite change and fatigue.  HENT: Negative for ear pain, congestion, neck pain, postnasal drip and sinus pressure.   Eyes: Negative for redness and visual disturbance.  Respiratory: Negative for cough, shortness of breath and wheezing.   Gastrointestinal: Negative for abdominal pain and abdominal distention.  Genitourinary: Negative for dysuria, frequency and menstrual problem.  Musculoskeletal: Negative for myalgias, joint swelling and arthralgias.  Skin: Negative for rash and wound.  Neurological: Negative for dizziness, weakness and headaches.  Hematological: Negative for adenopathy. Does not bruise/bleed easily.  Psychiatric/Behavioral: Negative for sleep disturbance and decreased concentration.   Past Medical History  Diagnosis Date  . Hypertension   . Hypothyroidism   . Allergy   . Anxiety   . Cyst of breast, right, diffuse fibrocystic     History   Social History  . Marital Status: Married    Spouse Name: N/A    Number of Children: N/A  . Years of Education: N/A   Occupational History  . Not on file.   Social History Main Topics  . Smoking status: Current Some Day Smoker    Types: Cigarettes  . Smokeless tobacco: Never Used   Comment: Occassional smoker  . Alcohol Use: Yes     Rarely  . Drug Use: No  . Sexually Active: Yes   Other Topics Concern  . Not on file   Social History Narrative  . No narrative on file    Past Surgical History  Procedure Date  . Aspiration cyst right breast     Family History  Problem Relation Age of Onset  . Diabetes Mother   . Cancer Father     Father died from brain tumor.    No Known Allergies  Current Outpatient Prescriptions on File Prior to Visit    Medication Sig Dispense Refill  . ACETAMINOPHEN-CAFF-BUTALBITAL 50-500-40 MG CAPS Take 1 capsule by mouth every 6 (six) hours as needed.        [name] atenolol-chlorthalidone (TENORETIC) 50-25 MG per tablet Take 1 tablet by mouth daily.  90 tablet  3  . CYMBALTA 60 MG capsule TAKE ONE CAPSULE BY MOUTH EVERY DAY  30 each  11  . fluticasone (FLONASE) 50 MCG/ACT nasal spray USE AS DIRECTED AS NEEDED  16 g  2  . levothyroxine (SYNTHROID, LEVOTHROID) 175 MCG tablet Take 1 tablet (175 mcg total) by mouth daily.  90 tablet  3  . peg 3350 powder (MOVIPREP) 100 G SOLR Take 1 kit (100 g total) by mouth once.  1 kit  0    BP 120/75  Pulse 76  Temp 98.3 F (36.8 C)  Resp 16  Ht 5\' 11"  (1.803 m)  Wt 190 lb (86.183 kg)  BMI 26.50 kg/m2        Objective:   Physical Exam  Nursing note and vitals reviewed. Constitutional: She is oriented to person, place, and time. She appears well-developed and well-nourished. No distress.  HENT:  Head: Normocephalic and atraumatic.  Right Ear: External ear normal.  Left Ear: External ear normal.  Nose: Nose normal.  Mouth/Throat: Oropharynx is clear and moist.  Eyes: Conjunctivae and EOM are normal. Pupils are equal, round, and reactive to light.  Neck: Normal range of motion.  Neck supple. No JVD present. No tracheal deviation present. No thyromegaly present.  Cardiovascular: Normal rate, regular rhythm, normal heart sounds and intact distal pulses.   No murmur heard. Pulmonary/Chest: Effort normal and breath sounds normal. She has no wheezes. She exhibits no tenderness.  Abdominal: Soft. Bowel sounds are normal.  Musculoskeletal: Normal range of motion. She exhibits no edema and no tenderness.  Lymphadenopathy:    She has no cervical adenopathy.  Neurological: She is alert and oriented to person, place, and time. She has normal reflexes. No cranial nerve deficit.  Skin: Skin is warm and dry. She is not diaphoretic.  Psychiatric: She has a normal mood and  affect. Her behavior is normal.          Assessment & Plan:  Mild increase in obsessive compulsive behavior stable depression on Cymbalta.  Stable allergic rhinitis on Flonase stable hypothyroidism.  No evidence for elevated blood pressure

## 2011-01-25 ENCOUNTER — Other Ambulatory Visit: Payer: BC Managed Care – PPO

## 2011-02-01 ENCOUNTER — Ambulatory Visit (INDEPENDENT_AMBULATORY_CARE_PROVIDER_SITE_OTHER)
Admission: RE | Admit: 2011-02-01 | Discharge: 2011-02-01 | Disposition: A | Discharge status: Home / Self Care | Payer: BC Managed Care – PPO | Source: Ambulatory Visit | Attending: Gastroenterology | Admitting: Gastroenterology

## 2011-02-01 DIAGNOSIS — K5900 Constipation, unspecified: Secondary | ICD-10-CM

## 2011-02-01 DIAGNOSIS — R1033 Periumbilical pain: Secondary | ICD-10-CM

## 2011-02-01 MED ORDER — IOHEXOL 300 MG/ML  SOLN
100.0000 mL | Freq: Once | INTRAMUSCULAR | Status: AC | PRN
  Administered 2011-02-01: 100 mL via INTRAVENOUS

## 2011-02-20 NOTE — Progress Notes (Signed)
Pt informed

## 2011-03-08 ENCOUNTER — Encounter: Payer: BC Managed Care – PPO | Admitting: Gastroenterology

## 2011-03-16 ENCOUNTER — Other Ambulatory Visit: Payer: Self-pay | Admitting: Internal Medicine

## 2011-05-10 ENCOUNTER — Ambulatory Visit (AMBULATORY_SURGERY_CENTER): Payer: BC Managed Care – PPO | Admitting: *Deleted

## 2011-05-10 VITALS — Ht 71.0 in | Wt 187.1 lb

## 2011-05-10 DIAGNOSIS — Z1211 Encounter for screening for malignant neoplasm of colon: Secondary | ICD-10-CM

## 2011-05-10 MED ORDER — PEG-KCL-NACL-NASULF-NA ASC-C 100 G PO SOLR: ORAL | Status: DC

## 2011-05-13 ENCOUNTER — Encounter: Payer: Self-pay | Admitting: Gastroenterology

## 2011-05-14 ENCOUNTER — Telehealth: Payer: Self-pay | Admitting: Gastroenterology

## 2011-05-14 NOTE — Telephone Encounter (Signed)
charge 

## 2011-05-15 ENCOUNTER — Encounter: Payer: BC Managed Care – PPO | Admitting: Gastroenterology

## 2011-07-25 ENCOUNTER — Other Ambulatory Visit: Payer: Self-pay | Admitting: Internal Medicine

## 2011-08-19 ENCOUNTER — Other Ambulatory Visit: Payer: Self-pay | Admitting: Internal Medicine

## 2011-09-06 ENCOUNTER — Other Ambulatory Visit: Payer: Self-pay | Admitting: Internal Medicine

## 2011-09-06 DIAGNOSIS — Z1231 Encounter for screening mammogram for malignant neoplasm of breast: Secondary | ICD-10-CM

## 2011-09-27 ENCOUNTER — Ambulatory Visit
Admission: RE | Admit: 2011-09-27 | Discharge: 2011-09-27 | Disposition: A | Discharge status: Home / Self Care | Payer: BC Managed Care – PPO | Source: Ambulatory Visit | Attending: Internal Medicine | Admitting: Internal Medicine

## 2011-09-27 DIAGNOSIS — Z1231 Encounter for screening mammogram for malignant neoplasm of breast: Secondary | ICD-10-CM

## 2011-10-25 ENCOUNTER — Other Ambulatory Visit (INDEPENDENT_AMBULATORY_CARE_PROVIDER_SITE_OTHER): Payer: BC Managed Care – PPO

## 2011-10-25 DIAGNOSIS — Z0000 Encounter for general adult medical examination without abnormal findings: Secondary | ICD-10-CM

## 2011-10-25 LAB — POCT URINALYSIS DIPSTICK
Blood, UA: NEGATIVE
Glucose, UA: NEGATIVE
Spec Grav, UA: 1.025
Urobilinogen, UA: 0.2

## 2011-10-25 LAB — HEPATIC FUNCTION PANEL
Bilirubin, Direct: 0 mg/dL (ref 0.0–0.3)
Total Bilirubin: 0.8 mg/dL (ref 0.3–1.2)
Total Protein: 7.3 g/dL (ref 6.0–8.3)

## 2011-10-25 LAB — LIPID PANEL
Cholesterol: 190 mg/dL (ref 0–200)
HDL: 41.3 mg/dL (ref >39.00)
LDL Cholesterol: 135 mg/dL — ABNORMAL HIGH (ref 0–99)
Triglycerides: 71 mg/dL (ref 0.0–149.0)
VLDL: 14.2 mg/dL (ref 0.0–40.0)

## 2011-10-25 LAB — CBC WITH DIFFERENTIAL/PLATELET
Basophils Absolute: 0 10*3/uL (ref 0.0–0.1)
Eosinophils Absolute: 0.1 10*3/uL (ref 0.0–0.7)
MCHC: 33.4 g/dL (ref 30.0–36.0)
MCV: 87.8 fl (ref 78.0–100.0)
Monocytes Absolute: 0.5 10*3/uL (ref 0.1–1.0)
Neutrophils Relative %: 48.3 % (ref 43.0–77.0)
Platelets: 271 10*3/uL (ref 150.0–400.0)
RDW: 13.1 % (ref 11.5–14.6)

## 2011-10-25 LAB — BASIC METABOLIC PANEL
BUN: 15 mg/dL (ref 6–23)
CO2: 26 mEq/L (ref 19–32)
Creatinine, Ser: 0.6 mg/dL (ref 0.4–1.2)
Potassium: 3.9 mEq/L (ref 3.5–5.1)
Sodium: 141 mEq/L (ref 135–145)

## 2011-11-01 ENCOUNTER — Encounter: Payer: Self-pay | Admitting: Internal Medicine

## 2011-11-01 ENCOUNTER — Ambulatory Visit (INDEPENDENT_AMBULATORY_CARE_PROVIDER_SITE_OTHER): Payer: BC Managed Care – PPO | Admitting: Internal Medicine

## 2011-11-01 VITALS — BP 134/80 | HR 72 | Temp 98.6°F | Resp 16 | Ht 71.0 in | Wt 182.0 lb

## 2011-11-01 DIAGNOSIS — I83203 Varicose veins of unspecified lower extremity with both ulcer of ankle and inflammation: Secondary | ICD-10-CM

## 2011-11-01 DIAGNOSIS — Z0000 Encounter for general adult medical examination without abnormal findings: Secondary | ICD-10-CM

## 2011-11-01 DIAGNOSIS — L97919 Non-pressure chronic ulcer of unspecified part of right lower leg with unspecified severity: Secondary | ICD-10-CM

## 2011-11-01 NOTE — Patient Instructions (Signed)
Decrease the Cymbalta to 30 mg by mouth daily for 2 weeks if there is no symptoms of decreased mood and take it every other day for 2 weeks then stop

## 2011-11-01 NOTE — Progress Notes (Signed)
Subjective:    Patient ID: Theresa Perez, female    DOB: 12/16/1950, 61 y.o.   MRN: [id number]  HPI CPX Discussed labs and screening "painful" varices of the right lower leg   Review of Systems  Constitutional: Negative for activity change, appetite change and fatigue.  HENT: Negative for ear pain, congestion, neck pain, postnasal drip and sinus pressure.   Eyes: Negative for redness and visual disturbance.  Respiratory: Negative for cough, shortness of breath and wheezing.   Gastrointestinal: Negative for abdominal pain and abdominal distention.  Genitourinary: Negative for dysuria, frequency and menstrual problem.  Musculoskeletal: Negative for myalgias, joint swelling and arthralgias.  Skin: Negative for rash and wound.  Neurological: Negative for dizziness, weakness and headaches.  Hematological: Negative for adenopathy. Does not bruise/bleed easily.  Psychiatric/Behavioral: Negative for disturbed wake/sleep cycle and decreased concentration.   Past Medical History  Diagnosis Date  . Hypertension   . Hypothyroidism   . Allergy   . Anxiety   . Cyst of breast, right, diffuse fibrocystic     History   Social History  . Marital Status: Married    Spouse Name: N/A    Number of Children: N/A  . Years of Education: N/A   Occupational History  . Not on file.   Social History Main Topics  . Smoking status: Current Every Day Smoker -- 0.3 packs/day    Types: Cigarettes  . Smokeless tobacco: Never Used   Comment: Occassional smoker  . Alcohol Use: Yes     Rarely  . Drug Use: No  . Sexually Active: Yes   Other Topics Concern  . Not on file   Social History Narrative  . No narrative on file    Past Surgical History  Procedure Date  . Aspiration cyst right breast   . Uterine ablation   . Thyroidectomy 1997    Family History  Problem Relation Age of Onset  . Diabetes Mother   . Cancer Father     Father died from brain tumor.  . Colon cancer Neg Hx   .  Stomach cancer Neg Hx     No Known Allergies  Current Outpatient Prescriptions on File Prior to Visit  Medication Sig Dispense Refill  . atenolol-chlorthalidone (TENORETIC) 50-25 MG per tablet Take 1 tablet by mouth daily.  90 tablet  3  . butalbital-acetaminophen-caffeine (ESGIC PLUS) 50-500-40 MG per tablet TAKE ONE TABLET BY MOUTH EVERY 6 HOURS AS NEEDED FOR HEADACHE  30 tablet  6  . CYMBALTA 60 MG capsule TAKE ONE CAPSULE BY MOUTH EVERY DAY  30 each  11  . fluticasone (FLONASE) 50 MCG/ACT nasal spray USE AS DIRECTED AS NEEDED  16 g  2  . levothyroxine (SYNTHROID, LEVOTHROID) 175 MCG tablet Take 1 tablet (175 mcg total) by mouth daily.  90 tablet  3    BP 134/80  Pulse 72  Temp 98.6 F (37 C)  Resp 16  Ht 5\' 11"  (1.803 m)  Wt 182 lb (82.555 kg)  BMI 25.38 kg/m2        Objective:   Physical Exam  Nursing note and vitals reviewed. Constitutional: She is oriented to person, place, and time. She appears well-developed and well-nourished. No distress.  HENT:  Head: Normocephalic and atraumatic.  Right Ear: External ear normal.  Left Ear: External ear normal.  Nose: Nose normal.  Mouth/Throat: Oropharynx is clear and moist.  Eyes: Conjunctivae normal and EOM are normal. Pupils are equal, round, and reactive to light.  Neck:  Normal range of motion. Neck supple. No JVD present. No tracheal deviation present. No thyromegaly present.  Cardiovascular: Normal rate, regular rhythm, normal heart sounds and intact distal pulses.   No murmur heard. Pulmonary/Chest: Effort normal and breath sounds normal. She has no wheezes. She exhibits no tenderness.  Abdominal: Soft. Bowel sounds are normal.  Musculoskeletal: Normal range of motion. She exhibits no edema and no tenderness.  Lymphadenopathy:    She has no cervical adenopathy.  Neurological: She is alert and oriented to person, place, and time. She has normal reflexes. No cranial nerve deficit.  Skin: Skin is warm and dry. She is  not diaphoretic.  Psychiatric: She has a normal mood and affect. Her behavior is normal.          Assessment & Plan:   This is a routine physical examination for this healthy  Female. Reviewed all health maintenance protocols including mammography colonoscopy bone density and reviewed appropriate screening labs. Her immunization history was reviewed as well as her current medications and allergies refills of her chronic medications were given and the plan for yearly health maintenance was discussed all orders and referrals were made as appropriate.   Varicose vein of the right lower extremity with inflammation and pre-ulcerative lesion on the foot.

## 2011-11-06 ENCOUNTER — Other Ambulatory Visit: Payer: Self-pay

## 2011-11-06 DIAGNOSIS — I83219 Varicose veins of right lower extremity with both ulcer of unspecified site and inflammation: Secondary | ICD-10-CM

## 2011-11-06 DIAGNOSIS — L97919 Non-pressure chronic ulcer of unspecified part of right lower leg with unspecified severity: Secondary | ICD-10-CM

## 2011-11-15 ENCOUNTER — Other Ambulatory Visit: Payer: Self-pay | Admitting: Internal Medicine

## 2011-11-21 ENCOUNTER — Encounter: Payer: BC Managed Care – PPO | Admitting: Vascular Surgery

## 2011-11-22 ENCOUNTER — Encounter: Payer: Self-pay | Admitting: Family

## 2011-11-22 ENCOUNTER — Ambulatory Visit (INDEPENDENT_AMBULATORY_CARE_PROVIDER_SITE_OTHER): Payer: BC Managed Care – PPO | Admitting: Family

## 2011-11-22 VITALS — BP 128/80 | HR 88 | Temp 100.5°F | Wt 181.0 lb

## 2011-11-22 DIAGNOSIS — F17200 Nicotine dependence, unspecified, uncomplicated: Secondary | ICD-10-CM

## 2011-11-22 DIAGNOSIS — R5383 Other fatigue: Secondary | ICD-10-CM

## 2011-11-22 DIAGNOSIS — Z720 Tobacco use: Secondary | ICD-10-CM

## 2011-11-22 DIAGNOSIS — R5381 Other malaise: Secondary | ICD-10-CM

## 2011-11-22 DIAGNOSIS — J019 Acute sinusitis, unspecified: Secondary | ICD-10-CM

## 2011-11-22 MED ORDER — AZITHROMYCIN 250 MG PO TABS: ORAL_TABLET | ORAL | Status: DC

## 2011-11-22 MED ORDER — METHYLPREDNISOLONE 4 MG PO KIT: PACK | ORAL | Status: AC

## 2011-11-22 NOTE — Progress Notes (Signed)
Subjective:    Patient ID: Theresa Perez, female    DOB: 01/06/1951, 61 y.o.   MRN: [id number]  HPI 61 year old WF, smoker, is in with cough, congestion, sore throat x 1 week, and worsening despite OTC Nyquil and Mucinex.    Review of Systems  Constitutional: Positive for fatigue.  HENT: Positive for congestion, sore throat, rhinorrhea, sneezing, postnasal drip and sinus pressure.   Eyes: Negative.   Respiratory: Positive for cough. Negative for shortness of breath and wheezing.   Cardiovascular: Negative.   Skin: Negative.   Psychiatric/Behavioral: Negative.    Past Medical History  Diagnosis Date  . Hypertension   . Hypothyroidism   . Allergy   . Anxiety   . Cyst of breast, right, diffuse fibrocystic     History   Social History  . Marital Status: Married    Spouse Name: N/A    Number of Children: N/A  . Years of Education: N/A   Occupational History  . Not on file.   Social History Main Topics  . Smoking status: Current Every Day Smoker -- 0.3 packs/day    Types: Cigarettes  . Smokeless tobacco: Never Used   Comment: Occassional smoker  . Alcohol Use: Yes     Rarely  . Drug Use: No  . Sexually Active: Yes   Other Topics Concern  . Not on file   Social History Narrative  . No narrative on file    Past Surgical History  Procedure Date  . Aspiration cyst right breast   . Uterine ablation   . Thyroidectomy 1997    Family History  Problem Relation Age of Onset  . Diabetes Mother   . Cancer Father     Father died from brain tumor.  . Colon cancer Neg Hx   . Stomach cancer Neg Hx     No Known Allergies  Current Outpatient Prescriptions on File Prior to Visit  Medication Sig Dispense Refill  . atenolol-chlorthalidone (TENORETIC) 50-25 MG per tablet Take 1 tablet by mouth daily.  90 tablet  3  . butalbital-acetaminophen-caffeine (ESGIC PLUS) 50-500-40 MG per tablet TAKE ONE TABLET BY MOUTH EVERY 6 HOURS AS NEEDED FOR HEADACHE  30 tablet  6  .  CYMBALTA 60 MG capsule TAKE ONE CAPSULE BY MOUTH EVERY DAY  30 each  11  . fluticasone (FLONASE) 50 MCG/ACT nasal spray USE AS DIRECTED AS NEEDED  16 g  2  . levothyroxine (SYNTHROID, LEVOTHROID) 175 MCG tablet TAKE ONE TABLET BY MOUTH EVERY DAY  90 tablet  2    BP 128/80  Pulse 88  Temp 100.5 F (38.1 C) (Oral)  Wt 181 lb (82.101 kg)  SpO2 98%chart    Objective:   Physical Exam  Constitutional: She is oriented to person, place, and time. She appears well-developed and well-nourished.  HENT:  Right Ear: External ear normal.  Left Ear: External ear normal.  Nose: Nose normal.       Maxillary sinus tenderness bilaterally.   Neck: Normal range of motion. Neck supple.  Cardiovascular: Normal rate, regular rhythm and normal heart sounds.   Pulmonary/Chest: Effort normal and breath sounds normal.  Neurological: She is alert and oriented to person, place, and time.  Skin: Skin is warm and dry.          Assessment & Plan:  Assessment: Acute Sinusitis, Fatigue, Tobacco Abuse  Plan: Zpak as directed. Medrol dosepak as directed. Call the office if symptoms worsen or persist. Recheck as scheduled and  sooner as needed.  

## 2011-11-22 NOTE — Patient Instructions (Addendum)

## 2011-12-04 ENCOUNTER — Telehealth: Payer: Self-pay | Admitting: Internal Medicine

## 2011-12-04 NOTE — Telephone Encounter (Signed)
Caller: Ryhanna/Patient; Patient Name: Theresa Perez; PCP: Adline Mango [facility]); Best Callback Phone Number: [phone];  Calling regarding sore throat that started on 12/02/11. Was in the office on 11/22/11 and given Zpack and Prednisone. Drinking well, afebrile. Took over the counter Day Quill/NyQuill, some relief. Emergent signs and symptoms ruled out as per Sore Throat protocol except for see in 24 hours due to no tonsils and white patches on back of throat. Appt scheduled for 12/05/11 at 1 pm with Adline Mango NP.

## 2011-12-05 ENCOUNTER — Ambulatory Visit: Payer: BC Managed Care – PPO | Admitting: Family

## 2011-12-06 ENCOUNTER — Encounter: Payer: Self-pay | Admitting: Surgery

## 2011-12-09 ENCOUNTER — Encounter (INDEPENDENT_AMBULATORY_CARE_PROVIDER_SITE_OTHER): Payer: BC Managed Care – PPO | Admitting: *Deleted

## 2011-12-09 ENCOUNTER — Encounter: Payer: BC Managed Care – PPO | Admitting: Surgery

## 2011-12-09 ENCOUNTER — Ambulatory Visit (INDEPENDENT_AMBULATORY_CARE_PROVIDER_SITE_OTHER): Payer: BC Managed Care – PPO | Admitting: Surgery

## 2011-12-09 ENCOUNTER — Encounter: Payer: Self-pay | Admitting: Surgery

## 2011-12-09 VITALS — BP 124/82 | HR 64 | Resp 16 | Ht 71.0 in | Wt 178.0 lb

## 2011-12-09 DIAGNOSIS — I739 Peripheral vascular disease, unspecified: Secondary | ICD-10-CM | POA: Insufficient documentation

## 2011-12-09 DIAGNOSIS — I83009 Varicose veins of unspecified lower extremity with ulcer of unspecified site: Secondary | ICD-10-CM

## 2011-12-09 DIAGNOSIS — I7025 Atherosclerosis of native arteries of other extremities with ulceration: Secondary | ICD-10-CM | POA: Insufficient documentation

## 2011-12-09 DIAGNOSIS — I83893 Varicose veins of bilateral lower extremities with other complications: Secondary | ICD-10-CM | POA: Insufficient documentation

## 2011-12-09 DIAGNOSIS — I83219 Varicose veins of right lower extremity with both ulcer of unspecified site and inflammation: Secondary | ICD-10-CM

## 2011-12-09 DIAGNOSIS — L98499 Non-pressure chronic ulcer of skin of other sites with unspecified severity: Secondary | ICD-10-CM | POA: Insufficient documentation

## 2011-12-09 DIAGNOSIS — L97909 Non-pressure chronic ulcer of unspecified part of unspecified lower leg with unspecified severity: Secondary | ICD-10-CM

## 2011-12-09 DIAGNOSIS — L97919 Non-pressure chronic ulcer of unspecified part of right lower leg with unspecified severity: Secondary | ICD-10-CM

## 2011-12-09 NOTE — Progress Notes (Signed)
[facility] [place]   Patient name: Theresa Perez MRN: [id number] DOB: 02/16/1951 Sex: female   Referred by: Dr. Lovell Sheehan  Reason for referral:  Chief Complaint  Patient presents with  . New Evaluation    Right  ankle vericose vein,  and patient has pain off and on.    HISTORY OF PRESENT ILLNESS: The patient is a very pleasant 61 year old female who had seen for evaluation of bilateral leg issues. She states that approximately 2-3 years ago she began having symptoms. On her right foot this initially began as a area of swelling and discoloration. This has progressed to more prominent veins on her right leg around her ankle. She states that she does have swelling in her legs, particularly with prolonged standing. She also experiences pain in her right foot with prolonged standing. She denies a history of deep vein thrombosis or phlebitis. She has a strong family history of varicose veins. She has not had a bleeding episode or ulceration.  The patient has a history of a thyroidectomy in 1997 secondary to a goiter. Since that time she has been taking medicine for hypertension.  Past Medical History  Diagnosis Date  . Hypertension   . Hypothyroidism   . Allergy   . Anxiety   . Cyst of breast, right, diffuse fibrocystic   . Cancer     Thyroid-Papillary    Past Surgical History  Procedure Date  . Aspiration cyst right breast   . Uterine ablation   . Thyroidectomy 1997  . Tonsillectomy 1969    History   Social History  . Marital Status: Married    Spouse Name: N/A    Number of Children: N/A  . Years of Education: N/A   Occupational History  . Not on file.   Social History Main Topics  . Smoking status: Current Every Day Smoker -- 0.3 packs/day    Types: Cigarettes  . Smokeless tobacco: Never Used   Comment: Occassional smoker  . Alcohol Use: Yes     Rarely  . Drug Use: No  . Sexually Active: Yes   Other Topics Concern  . Not on file    Social History Narrative  . No narrative on file    Family History  Problem Relation Age of Onset  . Diabetes Mother   . Deep vein thrombosis Mother     Varicose Veins  . Cancer Father     Father died from brain tumor.  [name] Heart disease Father   . Colon cancer Neg Hx   . Stomach cancer Neg Hx     Allergies as of 12/09/2011  . (No Known Allergies)    Current Outpatient Prescriptions on File Prior to Visit  Medication Sig Dispense Refill  . atenolol-chlorthalidone (TENORETIC) 50-25 MG per tablet Take 1 tablet by mouth daily.  90 tablet  3  . CYMBALTA 60 MG capsule TAKE ONE CAPSULE BY MOUTH EVERY DAY  30 each  11  . fluticasone (FLONASE) 50 MCG/ACT nasal spray USE AS DIRECTED AS NEEDED  16 g  2  . levothyroxine (SYNTHROID, LEVOTHROID) 175 MCG tablet TAKE ONE TABLET BY MOUTH EVERY DAY  90 tablet  2  . azithromycin (ZITHROMAX) 250 MG tablet 2 tabs po x 1. Then 1 tab a day x 4 more days  6 tablet  0  . butalbital-acetaminophen-caffeine (ESGIC PLUS) 50-500-40 MG per tablet TAKE ONE TABLET BY MOUTH EVERY 6 HOURS AS NEEDED FOR HEADACHE  30 tablet  6  REVIEW OF SYSTEMS: Cardiovascular: No chest pain, chest pressure, palpitations, orthopnea, or dyspnea on exertion.  Pulmonary: No productive cough, asthma or wheezing. Neurologic: No weakness, paresthesias, aphasia, or amaurosis. No dizziness. Hematologic: No bleeding problems or clotting disorders. Musculoskeletal: No joint pain or joint swelling. Gastrointestinal: No blood in stool or hematemesis Genitourinary: No dysuria or hematuria. Psychiatric:: No history of major depression. Integumentary: No rashes or ulcers. Constitutional: No fever or chills.  PHYSICAL EXAMINATION: General: The patient appears their stated age.  Vital signs are BP 124/82  Pulse 64  Resp 16  Ht 5\' 11"  (1.803 m)  Wt 178 lb (80.74 kg)  BMI 24.83 kg/m2  SpO2 96% HEENT:  No gross abnormalities Pulmonary: Respirations are  non-labored Musculoskeletal: There are no major deformities.   Skin: There are no ulcer or rashes noted. Psychiatric: The patient has normal affect. Cardiovascular: There is a regular rate and rhythm without significant murmur appreciated. Prominent spider veins in the right leg from the ankle down. Trace edema. No major varicosities  Diagnostic Studies: Duplex ultrasound has been ordered and reviewed. This shows no evidence of deep vein reflux. There is significant reflux in bilateral greater saphenous veins. The right saphenous vein has reflux throughout it's course with diameter measurements ranging from 0.462 0.87. The left saphenous vein has reflux proximally with diameter measurements from 0.46-0.88   Medication Changes: The patient was given a prescription for thigh-high 20-30 mm compression stockings  Assessment:  Bilateral superficial venous insufficiency Plan: The patient was given a prescription for 20-30 mm compression stockings, thigh high. She will come back to the office in 3 months to discuss laser ablation of her right greater saphenous vein, and possibly her left. At that time she will also inquire about injections for her spider veins and reticular veins.     Jorge Ny, M.D. [facility] [place] Office: [phone] Pager:  [phone]

## 2012-01-13 ENCOUNTER — Ambulatory Visit (INDEPENDENT_AMBULATORY_CARE_PROVIDER_SITE_OTHER): Payer: BC Managed Care – PPO | Admitting: Family

## 2012-01-13 ENCOUNTER — Encounter: Payer: Self-pay | Admitting: Family

## 2012-01-13 VITALS — BP 140/88 | Temp 98.2°F | Wt 178.0 lb

## 2012-01-13 DIAGNOSIS — N814 Uterovaginal prolapse, unspecified: Secondary | ICD-10-CM

## 2012-01-13 NOTE — Patient Instructions (Addendum)
Prolapse  Prolapse means the falling down, bulging, dropping, or drooping of a body part. Organs that commonly prolapse include the rectum, small intestine, bladder, urethra, vagina (birth canal), uterus (womb), and cervix. Prolapse occurs when the ligaments and muscle tissue around the rectum, bladder, and uterus are damaged or weakened.  CAUSES  This happens especially with:  Childbirth. Some women feel pelvic pressure or have trouble holding their urine right after childbirth, because of stretching and tearing of pelvic tissues. This generally gets better with time and the feeling usually goes away, but it may return with aging.  Chronic heavy lifting.  Aging.  Menopause, with loss of estrogen production weakening the pelvic ligaments and muscles.  Past pelvic surgery.  Obesity.  Chronic constipation.  Chronic cough. Prolapse may affect a single organ, or several organs may prolapse at the same time. The front wall of the vagina holds up the bladder. The back wall holds up part of the lower intestine, or rectum. The uterus fills a spot in the middle. All these organs can be involved when the ligaments and muscles around the vagina relax too much. This often gets worse when women stop producing estrogen (menopause). SYMPTOMS  Uncontrolled loss of urine (incontinence) with cough, sneeze, straining, and exercise.  More force may be required to have a bowel movement, due to trapping of the stool.  When part of an organ bulges through the opening of the vagina, there is sometimes a feeling of heaviness or pressure. It may feel as though something is falling out. This sensation increases with coughing or bearing down.  If the organs protrude through the opening of the vagina and rub against the clothing, there may be soreness, ulcers, infection, pain, and bleeding.  Lower back pain.  Pushing in the upper or lower part of the vagina, to pass urine or have a bowel movement.  Problems  having sexual intercourse.  Being unable to insert a tampon or applicator. DIAGNOSIS  Usually, a physical exam is all that is needed to identify the problem. During the examination, you may be asked to cough and strain while lying down, sitting up, and standing up. Your caregiver will determine if more testing is required, such as bladder function tests. Some diagnoses are:  Cystocele: Bulging and falling of the bladder into the top of the vagina.  Rectocele: Part of the rectum bulging into the vagina.  Prolapse of the uterus: The uterus falls or drops into the vagina.  Enterocele: Bulging of the top of the vagina, after a hysterectomy (uterus removal), with the small intestine bulging into the vagina. A hernia in the top of the vagina.  Urethrocele: The urethra (urine carrying tube) bulging into the vagina. TREATMENT  In most cases, prolapse needs to be treated only if it produces symptoms. If the symptoms are interfering with your usual daily or sexual activities, treatment may be necessary. The following are some measures that may be used to treat prolapse.  Estrogen may help elderly women with mild prolapse.  Kegel exercises may help mild cases of prolapse, by strengthening and tightening the muscles of the pelvic floor.  Pessaries are used in women who choose not to, or are unable to, have surgery. A pessary is a doughnut-shaped piece of plastic or rubber that is put into the vagina to keep the organs in place. This device must be fitted by your caregiver. Your caregiver will also explain how to care for yourself with the pessary. If it works well for you,   to keep the organs in place. This device must be fitted by your caregiver. Your caregiver will also explain how to care for yourself with the pessary. If it works well for you, this may be the only treatment required.   Surgery is often the only form of treatment for more severe prolapses. There are different types of surgery available. You should discuss what the best procedure is for you. If the uterus is prolapsed, it may be removed (hysterectomy) as part of the surgical treatment. Your caregiver will  discuss the risks and benefits with you.   Uterine-vaginal suspension (surgery to hold up the organs) may be used, especially if you want to maintain your fertility.  No form of treatment is guaranteed to correct the prolapse or relieve the symptoms.  HOME CARE INSTRUCTIONS    Wear a sanitary pad or absorbent product if you have incontinence of urine.   Avoid heavy lifting and straining with exercise and work.   Take over-the-counter pain medicine for minor discomfort.   Try taking estrogen or using estrogen vaginal cream.   Try Kegel exercises or use a pessary, before deciding to have surgery.   Do Kegel exercises after having a baby.  SEEK MEDICAL CARE IF:    Your symptoms interfere with your daily activities.   You need medicine to help with the discomfort.   You need to be fitted with a pessary.   You notice bleeding from the vagina.   You think you have ulcers or you notice ulcers on the cervix.   You have an oral temperature above 102 F (38.9 C).   You develop pain or blood with urination.   You have bleeding with a bowel movement.   The symptoms are interfering with your sex life.   You have urinary incontinence that interferes with your daily activities.   You lose urine with sexual intercourse.   You have a chronic cough.   You have chronic constipation.  Document Released: 08/11/2002 Document Revised: 04/29/2011 Document Reviewed: 02/19/2009  [employer] Patient Information 2013 [employer].

## 2012-01-13 NOTE — Progress Notes (Signed)
Subjective:    Patient ID: Theresa Perez, female    DOB: 1950/04/08, 61 y.o.   MRN: [id number]  HPI 61 year old white female, nonsmoker, patient of Dr. Lovell Sheehan is in today with complaints of a bulge from her vaginal opening times several weeks. Denies any pain, vaginal discharge, or redness. She is married and monogamous relationship. She has never had a hysterectomy. Reports the bulge being worse when she's been being and lifting. When she's lying down the bulge is barely existent.   Review of Systems  Constitutional: Negative.   Cardiovascular: Negative.   Gastrointestinal: Negative.   Genitourinary: Negative for urgency, frequency, vaginal bleeding, vaginal discharge and vaginal pain.       Bulge from the vaginal opening  Skin: Negative.   Hematological: Negative.   Psychiatric/Behavioral: Negative.    Past Medical History  Diagnosis Date  . Hypertension   . Hypothyroidism   . Allergy   . Anxiety   . Cyst of breast, right, diffuse fibrocystic   . Cancer     Thyroid-Papillary    History   Social History  . Marital Status: Married    Spouse Name: N/A    Number of Children: N/A  . Years of Education: N/A   Occupational History  . Not on file.   Social History Main Topics  . Smoking status: Current Every Day Smoker -- 0.3 packs/day    Types: Cigarettes  . Smokeless tobacco: Never Used     Comment: Occassional smoker  . Alcohol Use: Yes     Comment: Rarely  . Drug Use: No  . Sexually Active: Yes   Other Topics Concern  . Not on file   Social History Narrative  . No narrative on file    Past Surgical History  Procedure Date  . Aspiration cyst right breast   . Uterine ablation   . Thyroidectomy 1997  . Tonsillectomy 1969    Family History  Problem Relation Age of Onset  . Diabetes Mother   . Deep vein thrombosis Mother     Varicose Veins  . Cancer Father     Father died from brain tumor.  [name] Heart disease Father   . Colon cancer Neg Hx   .  Stomach cancer Neg Hx     No Known Allergies  Current Outpatient Prescriptions on File Prior to Visit  Medication Sig Dispense Refill  . atenolol-chlorthalidone (TENORETIC) 50-25 MG per tablet Take 1 tablet by mouth daily.  90 tablet  3  . butalbital-acetaminophen-caffeine (ESGIC PLUS) 50-500-40 MG per tablet TAKE ONE TABLET BY MOUTH EVERY 6 HOURS AS NEEDED FOR HEADACHE  30 tablet  6  . fluticasone (FLONASE) 50 MCG/ACT nasal spray USE AS DIRECTED AS NEEDED  16 g  2  . levothyroxine (SYNTHROID, LEVOTHROID) 175 MCG tablet TAKE ONE TABLET BY MOUTH EVERY DAY  90 tablet  2  . azithromycin (ZITHROMAX) 250 MG tablet 2 tabs po x 1. Then 1 tab a day x 4 more days  6 tablet  0  . CYMBALTA 60 MG capsule TAKE ONE CAPSULE BY MOUTH EVERY DAY  30 each  11    BP 140/88  Temp 98.2 F (36.8 C) (Oral)  Wt 178 lb (80.74 kg)chart    Objective:   Physical Exam  Constitutional: She is oriented to person, place, and time. She appears well-developed and well-nourished.  HENT:  Right Ear: External ear normal.  Left Ear: External ear normal.  Nose: Nose normal.  Mouth/Throat: Oropharynx is clear and  moist.  Neck: Normal range of motion. Neck supple.  Cardiovascular: Normal rate, regular rhythm and normal heart sounds.   Pulmonary/Chest: Effort normal and breath sounds normal.  Abdominal: Soft. Bowel sounds are normal.  Genitourinary: No vaginal discharge found.       Uterine prolapse present the vaginal opening. No pain, tenderness, drainage or discharge.  Neurological: She is alert and oriented to person, place, and time.  Skin: Skin is warm and dry.  Psychiatric: She has a normal mood and affect.          Assessment & Plan:  Assessment: Uterine prolapse  Plan: Discussed the etiology of the uterine prolapse and the options surrounding it. Patient will think about her options and decide if she would like to see gynecology. Right now, is not causing her any discomfort. Recheck with PCP as scheduled  and sooner when necessary. See GYN if she decides she would like to intervene.

## 2012-02-08 ENCOUNTER — Other Ambulatory Visit: Payer: Self-pay | Admitting: Internal Medicine

## 2012-02-14 ENCOUNTER — Emergency Department (HOSPITAL_COMMUNITY): Payer: BC Managed Care – PPO

## 2012-02-14 ENCOUNTER — Encounter (HOSPITAL_COMMUNITY): Payer: Self-pay | Admitting: Emergency Medicine

## 2012-02-14 ENCOUNTER — Emergency Department (HOSPITAL_COMMUNITY)
Admission: EM | Admit: 2012-02-14 | Discharge: 2012-02-15 | Disposition: A | Discharge status: Home / Self Care | Payer: BC Managed Care – PPO | Attending: Emergency Medicine | Admitting: Emergency Medicine

## 2012-02-14 DIAGNOSIS — Z85850 Personal history of malignant neoplasm of thyroid: Secondary | ICD-10-CM | POA: Insufficient documentation

## 2012-02-14 DIAGNOSIS — Y929 Unspecified place or not applicable: Secondary | ICD-10-CM | POA: Insufficient documentation

## 2012-02-14 DIAGNOSIS — I10 Essential (primary) hypertension: Secondary | ICD-10-CM | POA: Insufficient documentation

## 2012-02-14 DIAGNOSIS — Z8659 Personal history of other mental and behavioral disorders: Secondary | ICD-10-CM | POA: Insufficient documentation

## 2012-02-14 DIAGNOSIS — W268XXA Contact with other sharp object(s), not elsewhere classified, initial encounter: Secondary | ICD-10-CM | POA: Insufficient documentation

## 2012-02-14 DIAGNOSIS — F17200 Nicotine dependence, unspecified, uncomplicated: Secondary | ICD-10-CM | POA: Insufficient documentation

## 2012-02-14 DIAGNOSIS — Z8742 Personal history of other diseases of the female genital tract: Secondary | ICD-10-CM | POA: Insufficient documentation

## 2012-02-14 DIAGNOSIS — S60559A Superficial foreign body of unspecified hand, initial encounter: Secondary | ICD-10-CM

## 2012-02-14 DIAGNOSIS — Y939 Activity, unspecified: Secondary | ICD-10-CM | POA: Insufficient documentation

## 2012-02-14 DIAGNOSIS — E039 Hypothyroidism, unspecified: Secondary | ICD-10-CM | POA: Insufficient documentation

## 2012-02-14 DIAGNOSIS — Z79899 Other long term (current) drug therapy: Secondary | ICD-10-CM | POA: Insufficient documentation

## 2012-02-14 NOTE — ED Notes (Signed)
Pt states while drinking from glass rubbed L thumb across chip, states a sliver of glass stuck in skin. Visible particle removed in triage. Bleeding controlled.

## 2012-02-15 MED ORDER — CEPHALEXIN 500 MG PO CAPS: 500.0000 mg | ORAL_CAPSULE | Freq: Four times a day (QID) | ORAL | Status: DC

## 2012-02-15 NOTE — ED Notes (Signed)
PA remains at the bedside attempting to remove FB

## 2012-02-15 NOTE — ED Provider Notes (Signed)
History     CSN: [id number]  Arrival date & time 02/14/12  2002   First MD Initiated Contact with Patient 02/14/12 2332      Chief Complaint  Patient presents with  . Foreign Body    (Consider location/radiation/quality/duration/timing/severity/associated sxs/prior treatment) HPI Comments: Patient reports that just prior to arrival a glass broke while in her hand.  She reports that a sliver of glass was stuck in the palmar surface of the right thumb.  During triage a small piece of glass was removed from the finger by Triage Nurse. Xray taken after the removal shows that there is still a 3 mm foreign body present.   Patient reports that she continues to have a foreign body sensation in her thumb.  Tetanus is UTD.  She denies numbness or tingling.  Bleeding controlled at this time.    The history is provided by the patient.    Past Medical History  Diagnosis Date  . Hypertension   . Hypothyroidism   . Allergy   . Anxiety   . Cyst of breast, right, diffuse fibrocystic   . Cancer     Thyroid-Papillary    Past Surgical History  Procedure Date  . Aspiration cyst right breast   . Uterine ablation   . Thyroidectomy 1997  . Tonsillectomy 1969    Family History  Problem Relation Age of Onset  . Diabetes Mother   . Deep vein thrombosis Mother     Varicose Veins  . Cancer Father     Father died from brain tumor.  [name] Heart disease Father   . Colon cancer Neg Hx   . Stomach cancer Neg Hx     History  Substance Use Topics  . Smoking status: Current Every Day Smoker -- 0.3 packs/day    Types: Cigarettes  . Smokeless tobacco: Never Used     Comment: Occassional smoker  . Alcohol Use: Yes     Comment: Rarely    OB History    Grav Para Term Preterm Abortions TAB SAB Ect Mult Living                  Review of Systems  Skin: Positive for wound.  Neurological: Negative for numbness.  All other systems reviewed and are negative.    Allergies  Review of patient's  allergies indicates no known allergies.  Home Medications   Current Outpatient Rx  Name  Route  Sig  Dispense  Refill  . ATENOLOL-CHLORTHALIDONE 50-25 MG PO TABS   Oral   Take 1 tablet by mouth daily.         [name] BUTALBITAL-APAP-CAFFEINE 50-500-40 MG PO TABS   Oral   Take 1 tablet by mouth every 6 (six) hours as needed. migraine         . FLUTICASONE PROPIONATE 50 MCG/ACT NA SUSP   Nasal   Place 2 sprays into the nose daily.         [name] LEVOTHYROXINE SODIUM 175 MCG PO TABS   Oral   Take 175 mcg by mouth daily.           BP 122/62  Pulse 82  Temp 99.5 F (37.5 C) (Oral)  Resp 18  Ht 5\' 11"  (1.803 m)  Wt 175 lb (79.379 kg)  BMI 24.41 kg/m2  SpO2 98%  Physical Exam  Nursing note and vitals reviewed. Constitutional: She appears well-developed and well-nourished. No distress.  HENT:  Head: Normocephalic and atraumatic.  Mouth/Throat: Oropharynx is clear and moist.  Cardiovascular: Normal rate, regular rhythm and normal heart sounds.   Pulses:      Radial pulses are 2+ on the right side, and 2+ on the left side.  Pulmonary/Chest: Effort normal and breath sounds normal.  Neurological: She is alert. No sensory deficit.  Skin: She is not diaphoretic.       Small very superficial 3 mm laceration of the palmer surface of the left thumb.  No foreign body visualized.  Normal capillary refill of the left thumb  Psychiatric: She has a normal mood and affect.    ED Course  NERVE BLOCK Performed by: Anne Shutter, Daniela Siebers Authorized by: Anne Shutter, Herbert Seta Consent: Verbal consent obtained. Risks and benefits: risks, benefits and alternatives were discussed Consent given by: patient Patient identity confirmed: verbally with patient Indications: pain relief and debridement Nerve block body site: left thumb. Needle gauge: 25 G Local anesthetic: lidocaine 2% without epinephrine Anesthetic total: 5 ml Outcome: pain improved Patient tolerance: Patient tolerated the  procedure well with no immediate complications.   (including critical care time)  Labs Reviewed - No data to display Dg Finger Thumb Left  02/14/2012  *RADIOLOGY REPORT*  Clinical Data: Laceration to the distal left thumb from glass. Assess for foreign body.  LEFT THUMB 2+V  Comparison: None.  Findings: There appears to be a 3 mm linear radiopaque foreign body at the distal aspect of the thumb, projecting along the volar aspect of the soft tissues just deep to the skin surface.  No additional radiopaque foreign bodies are seen.  There is no evidence of osseous disruption.  Visualized joint spaces are preserved.  IMPRESSION: Apparent 3 mm linear radiopaque foreign body at the distal aspect of the thumb, projecting along the volar aspect of the soft tissues just deep to the skin surface.  No evidence of osseous disruption.   Original Report Authenticated By: Tonia Ghent, M.D.      No diagnosis found.  Digital block performed and small superficial incision made at the area where patient was complaining of foreign body sensation.  No definite foreign body visualized.    MDM  Patient presenting after a glass shattered in her hand.  Small piece of glass embedded in her thumb.  Attempted to remove foreign body.  No definite foreign body visualized.  Patient referred to hand surgery.          Pascal Lux [place], PA-C 02/16/12 2159

## 2012-02-16 NOTE — ED Provider Notes (Signed)
Medical screening examination/treatment/procedure(s) were performed by non-physician practitioner and as supervising physician I was immediately available for consultation/collaboration.  Deliliah Spranger, MD 02/16/12 2302 

## 2012-02-18 ENCOUNTER — Other Ambulatory Visit: Payer: Self-pay | Admitting: Internal Medicine

## 2012-02-18 MED ORDER — BUTALBITAL-APAP-CAFFEINE 50-325-40 MG PO TABS: 1.0000 | ORAL_TABLET | Freq: Two times a day (BID) | ORAL | Status: DC | PRN

## 2012-02-18 NOTE — Telephone Encounter (Signed)
Pt needs refill of butalbital-acetaminophen-caffeine (ESGIC PLUS) 50-500-40 MG per tablet. [facility] [address]

## 2012-02-24 ENCOUNTER — Other Ambulatory Visit: Payer: Self-pay | Admitting: Internal Medicine

## 2012-02-24 ENCOUNTER — Telehealth: Payer: Self-pay | Admitting: *Deleted

## 2012-02-24 MED ORDER — FLUTICASONE PROPIONATE 50 MCG/ACT NA SUSP: 2.0000 | Freq: Every day | NASAL | Status: DC

## 2012-02-24 NOTE — Telephone Encounter (Signed)
Error

## 2012-03-16 ENCOUNTER — Encounter: Payer: Self-pay | Admitting: Vascular Surgery

## 2012-03-17 ENCOUNTER — Ambulatory Visit (INDEPENDENT_AMBULATORY_CARE_PROVIDER_SITE_OTHER): Payer: BC Managed Care – PPO | Admitting: Vascular Surgery

## 2012-03-17 ENCOUNTER — Encounter: Payer: Self-pay | Admitting: Vascular Surgery

## 2012-03-17 VITALS — BP 135/79 | HR 76 | Resp 16 | Ht 71.0 in | Wt 175.0 lb

## 2012-03-17 DIAGNOSIS — I83893 Varicose veins of bilateral lower extremities with other complications: Secondary | ICD-10-CM

## 2012-03-17 NOTE — Progress Notes (Signed)
Subjective:     Patient ID: Theresa Perez, female   DOB: 10-Jan-1951, 62 y.o.   MRN: [id number]  HPI this 62 year old female returns for further evaluation of her varicose veins and edema in both lower extremities. She continues to have aching throbbing and burning discomfort particularly in the lower calf and ankles  as the day progresses despite wearing long leg elastic compression stockings 20-30 mm gradient and trying elevation and ibuprofen. Her worst symptoms are in the right leg but she  also has similar symptoms in the left leg in the calf and ankle area which worsens as the day progresses. She has a remote history of some sclerotherapy in both legs-left thigh and right ankle. She has no history of DVT or thrombophlebitis. She has documented gross reflux in both great saphenous systems with large caliber veins.  Past Medical History  Diagnosis Date  . Hypertension   . Hypothyroidism   . Allergy   . Anxiety   . Cyst of breast, right, diffuse fibrocystic   . Cancer     Thyroid-Papillary    History  Substance Use Topics  . Smoking status: Current Every Day Smoker -- 0.3 packs/day    Types: Cigarettes  . Smokeless tobacco: Never Used     Comment: Occassional smoker  . Alcohol Use: Yes     Comment: Rarely    Family History  Problem Relation Age of Onset  . Diabetes Mother   . Deep vein thrombosis Mother     Varicose Veins  . Cancer Father     Father died from brain tumor.  [name] Heart disease Father   . Colon cancer Neg Hx   . Stomach cancer Neg Hx     No Known Allergies  Current outpatient prescriptions:atenolol-chlorthalidone (TENORETIC) 50-25 MG per tablet, Take 1 tablet by mouth daily., Disp: , Rfl: ;  butalbital-acetaminophen-caffeine (ESGIC) 50-325-40 MG per tablet, Take 1 tablet by mouth 2 (two) times daily as needed for headache., Disp: 30 tablet, Rfl: 1;  fluticasone (FLONASE) 50 MCG/ACT nasal spray, Place 2 sprays into the nose daily., Disp: 16 g, Rfl:  11 levothyroxine (SYNTHROID, LEVOTHROID) 175 MCG tablet, Take 175 mcg by mouth daily., Disp: , Rfl: ;  cephALEXin (KEFLEX) 500 MG capsule, Take 1 capsule (500 mg total) by mouth 4 (four) times daily., Disp: 28 capsule, Rfl: 0  BP 135/79  Pulse 76  Resp 16  Ht 5\' 11"  (1.803 m)  Wt 175 lb (79.379 kg)  BMI 24.41 kg/m2  Body mass index is 24.41 kg/(m^2).          Review of Systems denies chest pain, dyspnea on exertion, PND, orthopnea, hemoptysis, claudication.    Objective:   Physical Exam blood pressure 130/79 heart rate 76 respirations 16 General well-developed well-nourished female in no apparent stress alert and oriented x3 Lungs no rhonchi or wheezing Cardiovascular regular rhythm and no murmurs Right lower extremity with 1+ edema and prominent reticular and spider veins lower third of leg extending into the ankle near the medial malleolus with some very early hyperpigmentation. Left leg with 1+ edema and prominent veins extending down lower third of the leg anteriorly. 2+ dorsalis pedis pulses palpable bilaterally.  Previous duplex scan performed 12/09/2011 revealed gross reflux both great saphenous systems with no DVT    Assessment:     Symptomatic bilateral venous insufficiency with gross reflux great saphenous veins bilaterally and symptoms which are resistant to conservative measures-affecting patient's daily living    Plan:     #  1 patient needs a laser ablation right great saphenous vein followed by 2 courses of sclerotherapy right leg #2 patient needs a laser ablation left great saphenous vein #3 we'll proceed with precertification to perform this in the near future to relieve this nice lady symptoms and edema

## 2012-03-20 ENCOUNTER — Encounter (HOSPITAL_COMMUNITY): Payer: Self-pay | Admitting: Pharmacist

## 2012-03-25 ENCOUNTER — Other Ambulatory Visit: Payer: Self-pay | Admitting: Obstetrics and Gynecology

## 2012-03-27 ENCOUNTER — Encounter (HOSPITAL_COMMUNITY)
Admission: RE | Admit: 2012-03-27 | Discharge: 2012-03-27 | Disposition: A | Discharge status: Home / Self Care | Payer: BC Managed Care – PPO | Source: Ambulatory Visit | Attending: Obstetrics and Gynecology | Admitting: Obstetrics and Gynecology

## 2012-03-27 ENCOUNTER — Other Ambulatory Visit: Payer: Self-pay

## 2012-03-27 ENCOUNTER — Encounter (HOSPITAL_COMMUNITY): Payer: Self-pay

## 2012-03-27 HISTORY — DX: Other specified postprocedural states: Z98.890

## 2012-03-27 HISTORY — DX: Other specified postprocedural states: R11.2

## 2012-03-27 HISTORY — DX: Other complications of anesthesia, initial encounter: T88.59XA

## 2012-03-27 HISTORY — DX: Adverse effect of unspecified anesthetic, initial encounter: T41.45XA

## 2012-03-27 LAB — CBC
MCHC: 34.8 g/dL (ref 30.0–36.0)
Platelets: 275 10*3/uL (ref 150–400)
RDW: 12.2 % (ref 11.5–15.5)
WBC: 6.5 10*3/uL (ref 4.0–10.5)

## 2012-03-27 NOTE — Pre-Procedure Instructions (Signed)
Today's and previous EKG reviewed and accepted by Dr Rodman Pickle.

## 2012-03-27 NOTE — Patient Instructions (Addendum)
20 Theresa Perez  03/27/2012   Your procedure is scheduled on:  03/31/12  Enter through the Main Entrance of [facility] at 9 AM.  Pick up the phone at the desk and dial [phone].   Call this number if you have problems the morning of surgery: [phone]   Remember:   Do not eat food:After Midnight.  Do not drink clear liquids: After Midnight.  Take these medicines the morning of surgery with A SIP OF WATER: take blood pressure medication and thyroid medication.   (bring advanced directive if available)   Do not wear jewelry, make-up or nail polish.  Do not wear lotions, powders, or perfumes. You may wear deodorant.  Do not shave 48 hours prior to surgery.  Do not bring valuables to the hospital.  Contacts, dentures or bridgework may not be worn into surgery.  Leave suitcase in the car. After surgery it may be brought to your room.  For patients admitted to the hospital, checkout time is 11:00 AM the day of discharge.   Patients discharged the day of surgery will not be allowed to drive home.  Name and phone number of your driver: NA  Special Instructions: Shower using CHG 2 nights before surgery and the night before surgery.  If you shower the day of surgery use CHG.  Use special wash - you have one bottle of CHG for all showers.  You should use approximately 1/3 of the bottle for each shower.   Please read over the following fact sheets that you were given: Surgical Site Infection Prevention

## 2012-03-30 NOTE — H&P (Signed)
Theresa Perez is an 62 y.o. female who presented to my office complaining of pelvic pressure and pain and something protruding from her vagina. On examination she was found to have her uterus presenting to the introitus as well as a large cystocele and rectocele. Due to the associated symptoms she desires a procedure to be carried out to correct these pelvic floor defects. She is now being admitted to undergo a vaginal hysterectomy and anterior and posterior colporrhaphy.   Pertinent Gynecological History: Menses: post-menopausal Sexually transmitted diseases: no past history Last pap: normal Date: 06/18/2007 OB History: G1 P1001   Menstrual History:  No LMP recorded. Patient is postmenopausal.    Past Medical History  Diagnosis Date  . Hypertension   . Hypothyroidism   . Allergy   . Anxiety   . Cyst of breast, right, diffuse fibrocystic   . Cancer     Thyroid-Papillary  . Complication of anesthesia   . PONV (postoperative nausea and vomiting)     Past Surgical History  Procedure Laterality Date  . Aspiration cyst right breast    . Uterine ablation    . Thyroidectomy  1997  . Tonsillectomy  1969    Family History  Problem Relation Age of Onset  . Diabetes Mother   . Deep vein thrombosis Mother     Varicose Veins  . Cancer Father     Father died from brain tumor.  [name] Heart disease Father   . Colon cancer Neg Hx   . Stomach cancer Neg Hx     Social History:  reports that she has been smoking Cigarettes.  She has been smoking about 0.30 packs per day. She has never used smokeless tobacco. She reports that  drinks alcohol. She reports that she does not use illicit drugs.  Allergies: No Known Allergies  No prescriptions prior to admission    Review of Systems  Constitutional: Negative.   HENT: Negative for sore throat and tinnitus.   Eyes: Negative.   Respiratory: Negative for cough, hemoptysis, sputum production and shortness of breath.   Cardiovascular:  Negative for chest pain, palpitations, orthopnea, claudication and leg swelling.  Gastrointestinal: Negative for heartburn, nausea, vomiting, abdominal pain, diarrhea, constipation, blood in stool and melena.  Genitourinary: Negative for dysuria, urgency, frequency, hematuria and flank pain.  Musculoskeletal: Negative.   Skin: Negative.   Neurological: Negative for headaches.  Endo/Heme/Allergies: Negative.     There were no vitals taken for this visit. Physical Exam  Constitutional: She is oriented to person, place, and time. She appears well-developed and well-nourished.  HENT:  Head: Normocephalic and atraumatic.  Nose: Nose normal.  Eyes: Conjunctivae and EOM are normal. Pupils are equal, round, and reactive to light.  Neck: Normal range of motion. Neck supple. No JVD present. No tracheal deviation present. No thyromegaly present.  Cardiovascular: Normal rate, regular rhythm and normal heart sounds.  Exam reveals no gallop.   No murmur heard. Respiratory: Effort normal and breath sounds normal. She has no rales.  GI: Soft. Bowel sounds are normal. She exhibits no distension and no mass. There is no tenderness. There is no rebound and no guarding.  Musculoskeletal: Normal range of motion.  Neurological: She is alert and oriented to person, place, and time.  Skin: Skin is warm and dry.  Psychiatric: She has a normal mood and affect.  Pelvic Exam:    External genitalia: within normal limits   BUS: within normal limits   Vagina with 2nd to 3rd degree cystocele,  2nd degree rectocele   Cervix; without lesion non tender   Uterus; retroflexed top normal size non tender with 2nd degree prolapse   Adnexa; no masses felt non tender   No results found for this or any previous visit (from the past 24 hour(s)).  No results found.  Impression: Uterine prolapse with cystocele and rectocele  Plan: Total vaginal hysterectomy and anterior and posterior colporrhaphy.  The risks and  limitations of the procedure were discussed with the patient including the risks for infection, hemorrhage, and injury to adjacent structures.   Corinna Burkman 03/30/2012, 5:27 PM

## 2012-03-31 ENCOUNTER — Ambulatory Visit (HOSPITAL_COMMUNITY): Payer: BC Managed Care – PPO | Admitting: Anesthesiology

## 2012-03-31 ENCOUNTER — Encounter (HOSPITAL_COMMUNITY): Payer: Self-pay | Admitting: *Deleted

## 2012-03-31 ENCOUNTER — Encounter (HOSPITAL_COMMUNITY): Payer: Self-pay | Admitting: Anesthesiology

## 2012-03-31 ENCOUNTER — Inpatient Hospital Stay (HOSPITAL_COMMUNITY)
Admission: RE | Admit: 2012-03-31 | Discharge: 2012-04-01 | DRG: 359 | Disposition: A | Discharge status: Home / Self Care | Payer: BC Managed Care – PPO | Source: Ambulatory Visit | Attending: Obstetrics and Gynecology | Admitting: Obstetrics and Gynecology

## 2012-03-31 ENCOUNTER — Encounter (HOSPITAL_COMMUNITY)
Admission: RE | Disposition: A | Discharge status: Home / Self Care | Payer: Self-pay | Source: Ambulatory Visit | Attending: Obstetrics and Gynecology

## 2012-03-31 DIAGNOSIS — D251 Intramural leiomyoma of uterus: Secondary | ICD-10-CM | POA: Diagnosis present

## 2012-03-31 DIAGNOSIS — N812 Incomplete uterovaginal prolapse: Principal | ICD-10-CM | POA: Diagnosis present

## 2012-03-31 DIAGNOSIS — I10 Essential (primary) hypertension: Secondary | ICD-10-CM | POA: Diagnosis present

## 2012-03-31 DIAGNOSIS — N840 Polyp of corpus uteri: Secondary | ICD-10-CM | POA: Diagnosis present

## 2012-03-31 DIAGNOSIS — N814 Uterovaginal prolapse, unspecified: Secondary | ICD-10-CM

## 2012-03-31 DIAGNOSIS — E039 Hypothyroidism, unspecified: Secondary | ICD-10-CM | POA: Diagnosis present

## 2012-03-31 DIAGNOSIS — Z85850 Personal history of malignant neoplasm of thyroid: Secondary | ICD-10-CM

## 2012-03-31 DIAGNOSIS — D252 Subserosal leiomyoma of uterus: Secondary | ICD-10-CM | POA: Diagnosis present

## 2012-03-31 HISTORY — PX: ANTERIOR AND POSTERIOR REPAIR: SHX5121

## 2012-03-31 HISTORY — PX: VAGINAL HYSTERECTOMY: SHX2639

## 2012-03-31 LAB — BASIC METABOLIC PANEL
Chloride: 101 mEq/L (ref 96–112)
GFR calc Af Amer: >90 mL/min (ref >90)
GFR calc non Af Amer: >90 mL/min (ref >90)
Potassium: 3.9 mEq/L (ref 3.5–5.1)
Sodium: 140 mEq/L (ref 135–145)

## 2012-03-31 LAB — SURGICAL PCR SCREEN
MRSA, PCR: NEGATIVE
Staphylococcus aureus: NEGATIVE

## 2012-03-31 SURGERY — HYSTERECTOMY, VAGINAL
Anesthesia: General | Site: Vagina | Wound class: Clean Contaminated

## 2012-03-31 MED ORDER — LIDOCAINE-EPINEPHRINE 1 %-1:100000 IJ SOLN
INTRAMUSCULAR | Status: DC | PRN
  Administered 2012-03-31: 10 mL
  Administered 2012-03-31: 6 mL

## 2012-03-31 MED ORDER — ONDANSETRON HCL 4 MG/2ML IJ SOLN
INTRAMUSCULAR | Status: AC
  Filled 2012-03-31: qty 2

## 2012-03-31 MED ORDER — SCOPOLAMINE 1 MG/3DAYS TD PT72
1.0000 | MEDICATED_PATCH | TRANSDERMAL | Status: DC
  Administered 2012-03-31: 1.5 mg via TRANSDERMAL

## 2012-03-31 MED ORDER — GLYCOPYRROLATE 0.2 MG/ML IJ SOLN
INTRAMUSCULAR | Status: DC | PRN
  Administered 2012-03-31: 0.2 mg via INTRAVENOUS

## 2012-03-31 MED ORDER — DEXAMETHASONE SODIUM PHOSPHATE 10 MG/ML IJ SOLN
INTRAMUSCULAR | Status: DC | PRN
  Administered 2012-03-31: 10 mg via INTRAVENOUS

## 2012-03-31 MED ORDER — DIPHENHYDRAMINE HCL 50 MG/ML IJ SOLN: 12.5000 mg | Freq: Four times a day (QID) | INTRAMUSCULAR | Status: DC | PRN

## 2012-03-31 MED ORDER — FENTANYL CITRATE 0.05 MG/ML IJ SOLN
INTRAMUSCULAR | Status: DC | PRN
  Administered 2012-03-31 (×2): 100 ug via INTRAVENOUS
  Administered 2012-03-31: 50 ug via INTRAVENOUS

## 2012-03-31 MED ORDER — HYDROMORPHONE 0.3 MG/ML IV SOLN
INTRAVENOUS | Status: DC
  Administered 2012-03-31: 14:00:00 via INTRAVENOUS
  Administered 2012-03-31: 4 mL via INTRAVENOUS
  Administered 2012-03-31: 0.9 mg via INTRAVENOUS
  Administered 2012-04-01: 0.3 mg via INTRAVENOUS
  Administered 2012-04-01: 1 mL via INTRAVENOUS
  Administered 2012-04-01: 0.3 mg via INTRAVENOUS
  Filled 2012-03-31: qty 25

## 2012-03-31 MED ORDER — DEXAMETHASONE SODIUM PHOSPHATE 10 MG/ML IJ SOLN
INTRAMUSCULAR | Status: AC
  Filled 2012-03-31: qty 1

## 2012-03-31 MED ORDER — MUPIROCIN 2 % EX OINT
TOPICAL_OINTMENT | Freq: Two times a day (BID) | CUTANEOUS | Status: DC
  Administered 2012-03-31: 09:00:00 via NASAL

## 2012-03-31 MED ORDER — MUPIROCIN 2 % EX OINT
TOPICAL_OINTMENT | CUTANEOUS | Status: AC
  Filled 2012-03-31: qty 22

## 2012-03-31 MED ORDER — INDIGOTINDISULFONATE SODIUM 8 MG/ML IJ SOLN
INTRAMUSCULAR | Status: DC | PRN
  Administered 2012-03-31: 40 mg via INTRAVENOUS

## 2012-03-31 MED ORDER — PROPOFOL 10 MG/ML IV EMUL
INTRAVENOUS | Status: AC
  Filled 2012-03-31: qty 20

## 2012-03-31 MED ORDER — MIDAZOLAM HCL 5 MG/5ML IJ SOLN
INTRAMUSCULAR | Status: DC | PRN
  Administered 2012-03-31: 2 mg via INTRAVENOUS

## 2012-03-31 MED ORDER — LIDOCAINE HCL (CARDIAC) 20 MG/ML IV SOLN
INTRAVENOUS | Status: AC
  Filled 2012-03-31: qty 5

## 2012-03-31 MED ORDER — LACTATED RINGERS IV SOLN
INTRAVENOUS | Status: DC
  Administered 2012-03-31: 125 mL/h via INTRAVENOUS
  Administered 2012-03-31: 12:00:00 via INTRAVENOUS

## 2012-03-31 MED ORDER — INDIGOTINDISULFONATE SODIUM 8 MG/ML IJ SOLN
INTRAMUSCULAR | Status: AC
  Filled 2012-03-31: qty 5

## 2012-03-31 MED ORDER — INFLUENZA VIRUS VACC SPLIT PF IM SUSP
0.5000 mL | INTRAMUSCULAR | Status: AC
  Administered 2012-04-01: 0.5 mL via INTRAMUSCULAR
  Filled 2012-03-31: qty 0.5

## 2012-03-31 MED ORDER — CEFAZOLIN SODIUM-DEXTROSE 2-3 GM-% IV SOLR
INTRAVENOUS | Status: AC
  Filled 2012-03-31: qty 50

## 2012-03-31 MED ORDER — LEVOTHYROXINE SODIUM 175 MCG PO TABS
175.0000 ug | ORAL_TABLET | Freq: Every day | ORAL | Status: DC
  Administered 2012-04-01: 175 ug via ORAL
  Filled 2012-03-31: qty 1

## 2012-03-31 MED ORDER — CEFAZOLIN SODIUM 1-5 GM-% IV SOLN
1.0000 g | Freq: Three times a day (TID) | INTRAVENOUS | Status: DC
  Administered 2012-03-31 – 2012-04-01 (×2): 1 g via INTRAVENOUS
  Filled 2012-03-31 (×3): qty 50

## 2012-03-31 MED ORDER — IBUPROFEN 600 MG PO TABS: 600.0000 mg | ORAL_TABLET | Freq: Four times a day (QID) | ORAL | Status: DC | PRN

## 2012-03-31 MED ORDER — NALOXONE HCL 0.4 MG/ML IJ SOLN: 0.4000 mg | INTRAMUSCULAR | Status: DC | PRN

## 2012-03-31 MED ORDER — 0.9 % SODIUM CHLORIDE (POUR BTL) OPTIME
TOPICAL | Status: DC | PRN
  Administered 2012-03-31: 1000 mL

## 2012-03-31 MED ORDER — FLUTICASONE PROPIONATE 50 MCG/ACT NA SUSP
2.0000 | Freq: Every day | NASAL | Status: DC
  Administered 2012-03-31 – 2012-04-01 (×2): 2 via NASAL
  Filled 2012-03-31: qty 16

## 2012-03-31 MED ORDER — PROPOFOL 10 MG/ML IV EMUL
INTRAVENOUS | Status: DC | PRN
  Administered 2012-03-31: 200 mg via INTRAVENOUS

## 2012-03-31 MED ORDER — OXYCODONE-ACETAMINOPHEN 5-325 MG PO TABS
1.0000 | ORAL_TABLET | ORAL | Status: DC | PRN
  Administered 2012-04-01: 2 via ORAL
  Filled 2012-03-31: qty 2

## 2012-03-31 MED ORDER — LACTATED RINGERS IV SOLN
INTRAVENOUS | Status: DC
  Administered 2012-03-31 – 2012-04-01 (×3): via INTRAVENOUS

## 2012-03-31 MED ORDER — CEFAZOLIN SODIUM-DEXTROSE 2-3 GM-% IV SOLR
2.0000 g | INTRAVENOUS | Status: AC
  Administered 2012-03-31: 2 g via INTRAVENOUS

## 2012-03-31 MED ORDER — LIDOCAINE HCL (CARDIAC) 20 MG/ML IV SOLN
INTRAVENOUS | Status: DC | PRN
  Administered 2012-03-31: 50 mg via INTRAVENOUS

## 2012-03-31 MED ORDER — GLYCOPYRROLATE 0.2 MG/ML IJ SOLN
INTRAMUSCULAR | Status: AC
  Filled 2012-03-31: qty 1

## 2012-03-31 MED ORDER — MENTHOL 3 MG MT LOZG: 1.0000 | LOZENGE | OROMUCOSAL | Status: DC | PRN

## 2012-03-31 MED ORDER — DIPHENHYDRAMINE HCL 12.5 MG/5ML PO ELIX: 12.5000 mg | ORAL_SOLUTION | Freq: Four times a day (QID) | ORAL | Status: DC | PRN

## 2012-03-31 MED ORDER — MIDAZOLAM HCL 2 MG/2ML IJ SOLN
INTRAMUSCULAR | Status: AC
  Filled 2012-03-31: qty 2

## 2012-03-31 MED ORDER — SODIUM CHLORIDE 0.9 % IJ SOLN: 9.0000 mL | INTRAMUSCULAR | Status: DC | PRN

## 2012-03-31 MED ORDER — ONDANSETRON HCL 4 MG/2ML IJ SOLN
INTRAMUSCULAR | Status: DC | PRN
  Administered 2012-03-31: 4 mg via INTRAVENOUS

## 2012-03-31 MED ORDER — HYDROMORPHONE HCL PF 1 MG/ML IJ SOLN
INTRAMUSCULAR | Status: AC
  Administered 2012-03-31: 0.5 mg via INTRAVENOUS
  Filled 2012-03-31: qty 1

## 2012-03-31 MED ORDER — ONDANSETRON HCL 4 MG/2ML IJ SOLN: 4.0000 mg | Freq: Four times a day (QID) | INTRAMUSCULAR | Status: DC | PRN

## 2012-03-31 MED ORDER — SCOPOLAMINE 1 MG/3DAYS TD PT72
MEDICATED_PATCH | TRANSDERMAL | Status: AC
  Administered 2012-03-31: 1.5 mg via TRANSDERMAL
  Filled 2012-03-31: qty 1

## 2012-03-31 MED ORDER — FENTANYL CITRATE 0.05 MG/ML IJ SOLN
INTRAMUSCULAR | Status: AC
  Filled 2012-03-31: qty 5

## 2012-03-31 MED ORDER — HYDROMORPHONE HCL PF 1 MG/ML IJ SOLN
0.2500 mg | INTRAMUSCULAR | Status: DC | PRN
  Administered 2012-03-31 (×2): 0.5 mg via INTRAVENOUS

## 2012-03-31 MED ORDER — EPHEDRINE SULFATE 50 MG/ML IJ SOLN
INTRAMUSCULAR | Status: DC | PRN
  Administered 2012-03-31 (×2): 10 mg via INTRAVENOUS

## 2012-03-31 MED ORDER — EPHEDRINE 5 MG/ML INJ
INTRAVENOUS | Status: AC
  Filled 2012-03-31: qty 10

## 2012-03-31 SURGICAL SUPPLY — 34 items
BLADE SURG 15 STRL LF C SS BP (BLADE) ×1 IMPLANT
BLADE SURG 15 STRL SS (BLADE) ×1
CANISTER SUCTION 2500CC (MISCELLANEOUS) ×2 IMPLANT
CATH BONANNO SUPRAPUBIC 14G (CATHETERS) IMPLANT
CLOTH BEACON ORANGE TIMEOUT ST (SAFETY) ×2 IMPLANT
CONT PATH 16OZ SNAP LID 3702 (MISCELLANEOUS) ×2 IMPLANT
DECANTER SPIKE VIAL GLASS SM (MISCELLANEOUS) ×2 IMPLANT
DRESSING TELFA 8X3 (GAUZE/BANDAGES/DRESSINGS) ×2 IMPLANT
GAUZE PACKING IODOFORM 1 (PACKING) IMPLANT
GLOVE BIO SURGEON STRL SZ7.5 (GLOVE) ×4 IMPLANT
GLOVE BIOGEL PI IND STRL 6 (GLOVE) ×1 IMPLANT
GLOVE BIOGEL PI IND STRL 6.5 (GLOVE) ×1 IMPLANT
GLOVE BIOGEL PI INDICATOR 6 (GLOVE) ×1
GLOVE BIOGEL PI INDICATOR 6.5 (GLOVE) ×1
GOWN PREVENTION PLUS XXLARGE (GOWN DISPOSABLE) ×2 IMPLANT
GOWN STRL REIN XL XLG (GOWN DISPOSABLE) ×8 IMPLANT
NEEDLE HYPO 22GX1.5 SAFETY (NEEDLE) IMPLANT
NEEDLE MAYO .5 CIRCLE (NEEDLE) IMPLANT
NEEDLE SPNL 22GX3.5 QUINCKE BK (NEEDLE) ×2 IMPLANT
NS IRRIG 1000ML POUR BTL (IV SOLUTION) ×2 IMPLANT
PACK VAGINAL WOMENS (CUSTOM PROCEDURE TRAY) ×2 IMPLANT
PAD OB MATERNITY 4.3X12.25 (PERSONAL CARE ITEMS) ×2 IMPLANT
SUT VIC AB 0 CT1 18XCR BRD8 (SUTURE) ×2 IMPLANT
SUT VIC AB 0 CT1 27 (SUTURE) ×3
SUT VIC AB 0 CT1 27XBRD ANBCTR (SUTURE) ×3 IMPLANT
SUT VIC AB 0 CT1 8-18 (SUTURE) ×2
SUT VIC AB 2-0 CT1 27 (SUTURE) ×2
SUT VIC AB 2-0 CT1 TAPERPNT 27 (SUTURE) ×2 IMPLANT
SUT VIC AB 2-0 SH 27 (SUTURE) ×5
SUT VIC AB 2-0 SH 27XBRD (SUTURE) ×5 IMPLANT
SUT VICRYL 1 TIES 12X18 (SUTURE) ×2 IMPLANT
TOWEL OR 17X24 6PK STRL BLUE (TOWEL DISPOSABLE) ×4 IMPLANT
TRAY FOLEY CATH 14FR (SET/KITS/TRAYS/PACK) ×2 IMPLANT
WATER STERILE IRR 1000ML POUR (IV SOLUTION) IMPLANT

## 2012-03-31 NOTE — Op Note (Signed)
Theresa Perez, MERRIWEATHER NO.:  [id number]  MEDICAL RECORD NO.:  [id number]  LOCATION:  9311                          FACILITY:  [facility]  PHYSICIAN:  Miguel Aschoff, M.D.       DATE OF BIRTH:  1950/07/22  DATE OF PROCEDURE:  03/31/2012 DATE OF DISCHARGE:                              OPERATIVE REPORT   PREOPERATIVE DIAGNOSIS:  Pelvic relaxation with uterine descensus, cystocele and rectocele.  POSTOPERATIVE DIAGNOSIS:  Pelvic relaxation with uterine descensus, cystocele and rectocele.  PROCEDURE:  Total vaginal hysterectomy, anterior posterior colporrhaphy.  SURGEON:  Miguel Aschoff, M.D.  ASSISTANT:  Luvenia Redden, MD  ANESTHESIA:  General.  COMPLICATIONS:  None.  JUSTIFICATION:  The patient is a 62 year old white female, who presents to the office complaining of pelvic pressure and pain and noticing something protruding at the introitus.  On examination, she was noted to have a second to third-degree cystocele, second to third-degree prolapse of her uterus, second-degree rectocele.  Because of these pelvic floor defects and symptoms associated with them, the patient has requested that a procedure be carried out in effort to correct these problems and she has given her informed consent for total vaginal hysterectomy and anterior and posterior colporrhaphy.  The risks and benefits of procedure were discussed with the patient on her preop visit.  PROCEDURE IN DETAIL:  The patient was taken to the operating room, placed in the supine position.  General anesthesia was administered without difficulty.  She was then placed in the dorsal lithotomy position.  Prepped and draped in the usual sterile fashion.  Foley catheter was inserted and being placed in the high modified lithotomy position.  Speculum was placed in the vaginal vault.  Anterior cervical lip grasped with a tenaculum and then the cervix was injected with total of 6 mL of 1% Xylocaine with epinephrine for  hemostasis.  At this point, the cervical mucosa were circumscribed and then dissected anteriorly and posteriorly until the peritoneal reflections were found.  The peritoneum was then entered posteriorly.  Once this was done, curved Heaney clamps were placed across the uterosacral ligaments.  These ligaments were clamped, cut, and suture ligated, and then tagged.  The cardinal ligaments were clamped, cut, and suture ligated in a similar fashion using curved Heaney clamps and suture ligatures of 0 Vicryl.  Then, 0 bites were taken of paracervical fascia all using curved Heaney clamps and suture ligatures of 0 Vicryl.  The uterine vessels were then found, clamped, cut, and suture ligated in similar fashion, and then dissection continued bilaterally until the upper fundus was reached.  All pedicles being clamped with Heaney clamps and suture ligated.  The fundus was then delivered through the cul-de-sac and residual structures including the fallopian tube and round ligament were then clamped with Heaney clamps.  These pedicles were cut and then doubly ligated using suture ligatures of 0 Vicryl.  All pedicles were then inspected hemostasis appeared to be excellent at this point with specimen being removed.  It was elected to close the peritoneum.  This was done using a pursestring suture of 0 Vicryl.  Once this was done, attention was directed to the anterior vaginal wall and  was injected with total 6 mL of 1% Xylocaine with epinephrine, and then the vaginal mucosa was dissected in the midline within 3 cm of the urethral orifice and once the dissection was carried out, the mucosa was separated from the underlying paravesical fascia without difficulty.  The cystocele was then identified and reduced using 0 pursestring sutures of 2-0 Vicryl, and then the paravesical fascia was sutured using interrupted 2-0 Vicryl sutures. There was good support of the anterior vaginal wall.  The excess  vaginal mucosa was then trimmed and then reapproximated using running interlocking 2-0 Vicryl suture closing dead space as the sutures were placed.  The vaginal cuff was then closed using running interlocking 0 Vicryl suture and the uterosacral ligaments were sutured to each other to provide support to the anterior and upper vagina.  With the vaginal mucosa now being closed and anterior repair done attention was directed to the posterior vaginal wall.  An ellipse of skin was incised in the perineum and then the posterior vaginal wall injected with 4 mL of 1% Xylocaine with epinephrine.  Then, the dissection continued in the midline across the vaginal mucosa 2-3 cm of the apex of the cuff.  The rectocele was then identified and reduced using the 0 pursestring sutures of 2-0 Vicryl suture, and then the perirectal fascia was reapproximated in the midline using several interrupted 2-0 Vicryl sutures.  The excess vaginal mucosa was then trimmed and the posterior wall was reapproximated using running interlocking 2-0 Vicryl suture closing the dead spaces.  The repair continued.  The perineal body was then reconstituted using a crown suture of 0 Vicryl.  The 2nd suture was placed to provide additional support to the perineal body.  At this point, the perineum was reconstructed using a cutaneous suture of 2-0 Vicryl followed by subcuticular suture which reapproximated the perineal skin.  At this point, final inspection was made for hemostasis. Hemostasis appeared to be excellent.  A vaginal pack was placed using iodoform gauze.  The patient was taken out of lithotomy position, and brought to recovery in satisfactory condition.  The estimated blood loss was approximately 150 mL.  The patient tolerated the procedure well and went to recovery in satisfactory condition.     Miguel Aschoff, M.D.     AR/MEDQ  D:  03/31/2012  T:  03/31/2012  Job:  [id number]

## 2012-03-31 NOTE — Anesthesia Preprocedure Evaluation (Addendum)
Anesthesia Evaluation  Patient identified by MRN, date of birth, ID band Patient awake    Reviewed: Allergy & Precautions, H&P , Patient's Chart, lab work & pertinent test results, reviewed documented beta blocker date and time   Airway Mallampati: II TM Distance: >3 FB Neck ROM: full    Dental no notable dental hx.    Pulmonary  breath sounds clear to auscultation  Pulmonary exam normal       Cardiovascular hypertension, Pt. on medications Rhythm:regular Rate:Normal     Neuro/Psych    GI/Hepatic   Endo/Other    Renal/GU      Musculoskeletal   Abdominal   Peds  Hematology   Anesthesia Other Findings   Reproductive/Obstetrics                          Anesthesia Physical Anesthesia Plan  ASA: II  Anesthesia Plan: General   Post-op Pain Management:    Induction: Intravenous  Airway Management Planned: LMA  Additional Equipment:   Intra-op Plan:   Post-operative Plan:   Informed Consent: I have reviewed the patients History and Physical, chart, labs and discussed the procedure including the risks, benefits and alternatives for the proposed anesthesia with the patient or authorized representative who has indicated his/her understanding and acceptance.   Dental Advisory Given  Plan Discussed with: CRNA and Surgeon  Anesthesia Plan Comments: (  Discussed  general anesthesia, including possible nausea, instrumentation of airway, sore throat,pulmonary aspiration, etc. I asked if the were any outstanding questions, or  concerns before we proceeded. )        Anesthesia Quick Evaluation

## 2012-03-31 NOTE — Anesthesia Postprocedure Evaluation (Signed)
  Anesthesia Post-op Note  Patient: Theresa Perez  Procedure(s) Performed: Procedure(s): HYSTERECTOMY VAGINAL (N/A) ANTERIOR (CYSTOCELE) AND POSTERIOR REPAIR (RECTOCELE) (N/A)  Patient is awake and responsive. Pain and nausea are reasonably well controlled. Vital signs are stable and clinically acceptable. Oxygen saturation is clinically acceptable. There are no apparent anesthetic complications at this time. Patient is ready for discharge.

## 2012-03-31 NOTE — Brief Op Note (Signed)
03/31/2012  12:26 PM  PATIENT:  Theresa Perez  62 y.o. female  PRE-OPERATIVE DIAGNOSIS:  PROLAPSE CYSTOCELE [id number]  POST-OPERATIVE DIAGNOSIS:  PROLAPSE CYSTOCELE RECTOCELE  PROCEDURE:  Procedure(s): HYSTERECTOMY VAGINAL (N/A) ANTERIOR (CYSTOCELE) AND POSTERIOR REPAIR (RECTOCELE) (N/A)  SURGEON:  Surgeon(s) and Role:    * Miguel Aschoff, MD - Primary    * W Lodema Hong, MD - Assisting   ANESTHESIA:   general  EBL:  Total I/O In: 1600 [I.V.:1600] Out: 350 [Urine:250; Blood:100]  BLOOD ADMINISTERED:none  DRAINS: Urinary Catheter (Foley)   LOCAL MEDICATIONS USED:  XYLOCAINE   SPECIMEN:  Source of Specimen:  cervix and uterus  DISPOSITION OF SPECIMEN:  PATHOLOGY  COUNTS:  YES  TOURNIQUET:  * No tourniquets in log *  DICTATION: .Other Dictation: Dictation Number [id number]  PLAN OF CARE: Admit to inpatient   PATIENT DISPOSITION:  PACU - hemodynamically stable.   Delay start of Pharmacological VTE agent (>24hrs) due to surgical blood loss or risk of bleeding: PAS hose applied

## 2012-03-31 NOTE — Anesthesia Postprocedure Evaluation (Signed)
  Anesthesia Post-op Note  Patient: Theresa Perez  Procedure(s) Performed: Procedure(s): HYSTERECTOMY VAGINAL (N/A) ANTERIOR (CYSTOCELE) AND POSTERIOR REPAIR (RECTOCELE) (N/A)  Anesthesia Post Note  Patient: Theresa Perez  Procedure(s) Performed: Procedure(s) (LRB): HYSTERECTOMY VAGINAL (N/A) ANTERIOR (CYSTOCELE) AND POSTERIOR REPAIR (RECTOCELE) (N/A)  Anesthesia type: General  Patient location: [facility]  Post pain: Pain level controlled  Post assessment: Post-op Vital signs reviewed  Last Vitals:  Filed Vitals:   03/31/12 1615  BP: 109/56  Pulse: 64  Temp: 36.3 C  Resp: 15    Post vital signs: Reviewed  Level of consciousness: sedated  Complications: No apparent anesthesia complications

## 2012-03-31 NOTE — Transfer of Care (Signed)
Immediate Anesthesia Transfer of Care Note  Patient: Theresa Perez  Procedure(s) Performed: Procedure(s): HYSTERECTOMY VAGINAL (N/A) ANTERIOR (CYSTOCELE) AND POSTERIOR REPAIR (RECTOCELE) (N/A)  Patient Location: PACU  Anesthesia Type:General  Level of Consciousness: awake, alert  and oriented  Airway & Oxygen Therapy: Patient Spontanous Breathing and Patient connected to nasal cannula oxygen  Post-op Assessment: Report given to PACU RN and Post -op Vital signs reviewed and stable  Post vital signs: Reviewed and stable  Complications: No apparent anesthesia complications

## 2012-04-01 ENCOUNTER — Encounter (HOSPITAL_COMMUNITY): Payer: Self-pay | Admitting: Obstetrics and Gynecology

## 2012-04-01 LAB — CBC
HCT: 33.5 % — ABNORMAL LOW (ref 36.0–46.0)
Hemoglobin: 11.2 g/dL — ABNORMAL LOW (ref 12.0–15.0)
MCH: 28.7 pg (ref 26.0–34.0)
RBC: 3.9 MIL/uL (ref 3.87–5.11)

## 2012-04-01 NOTE — Progress Notes (Signed)
Patient ID: Theresa Perez, female   DOB: 09-Oct-1950, 62 y.o.   MRN: [id number]  S: Doing well status post TVH A&P repair. Pack has been removed and foley is out.  O: Afebrile V/S are stable  Abdomen is soft and non tender.  Perineum is dry   Hg. 11.2 WBC 8.2  A:Stable post op  P: Ambulate and increase diet. Will reassess for discharge this PM.

## 2012-04-01 NOTE — Progress Notes (Signed)
Patient ID: Theresa Perez, female   DOB: 03/24/1950, 62 y.o.   MRN: [id number]  S: Doing well without IV and foley. Taking regular diet and ambulating well  O: Remains afebrile  Abdomen is soft and there is no significant bleeding now that packing has been removed.  Plan: Discharge to home          Meds Tylox one every three to four hours as needed for pain.           Nothing per vagina for 4 weeks          Condition improved           Path pending.            Resume all pre op meds

## 2012-04-01 NOTE — Progress Notes (Signed)
Pt discharged to home  Teaching  Complete  Ambulated out

## 2012-04-01 NOTE — Progress Notes (Signed)
Ur chart review completed.  

## 2012-04-07 NOTE — Discharge Summary (Signed)
NAMEJERMIYA, REICHL NO.:  [id number]  MEDICAL RECORD NO.:  [id number]  LOCATION:  9311                          FACILITY:  [facility]  PHYSICIAN:  Miguel Aschoff, M.D.       DATE OF BIRTH:  Aug 27, 1950  DATE OF ADMISSION:  03/31/2012 DATE OF DISCHARGE:  04/01/2012                              DISCHARGE SUMMARY   ADMISSION DIAGNOSIS:  Uterine prolapse with cystocele and rectocele.  FINAL DIAGNOSIS:  Uterine prolapse with cystocele and rectocele with uterine fibroids.  OPERATIONS AND PROCEDURES:  Total vaginal hysterectomy and anterior posterior colporrhaphy.  ANESTHESIA:  General anesthesia.  BRIEF HISTORY:  The patient is a 62 year old white female, who presented to my office complaining of lower abdominal pressure and pain and something protruding through the introitus.  On examination, she was noted to have a large grade 2 to grade 3 cystocele.  Her cervix was presenting to the introitus and in addition there was a second-degree rectocele present.  Because of the symptoms associated with these pelvic floor defects, the patient's active lifestyle, she requested that a procedure be carried out in effort to correct these pelvic floor defects and was admitted to the hospital to undergo vaginal hysterectomy and anterior and posterior colporrhaphy.  HOSPITAL COURSE:  Preoperative studies were obtained.  This included chemistry profile, which was essentially within normal limits.  Her admission hemoglobin was 13, white count was normal.  PT and PTT were within normal limits.  Under general anesthesia on March 31, 2012, a total vaginal hysterectomy and anterior and posterior colporrhaphy were carried out without difficulty.  The patient's postoperative course was essentially uncomplicated.  She tolerated increased ambulation and diet well, was able to void without the need for Foley catheter, was tolerating p.o. pain medications, ambulating well, and was felt to  be satisfactory enough to be discharged home on April 01, 2012.  The pathology report on the hysterectomy specimen revealed the uterus to weigh 130 g.  There was inactive endometrium.  There were multiple leiomyomata.  The cervix did not reveal any abnormalities except for mild inflammation.  The patient was discharged home on Tylox 1 every 3 hours as needed for pain.  She is instructed to place nothing in the vagina, to call if there are any problems such as fever, pain, or heavy bleeding.  She was sent home on a regular diet in satisfactory condition.  She was told to resume all her preoperative medications and herbal medications.  She will be seen back in 4 weeks for followup examination and she understands to call for any problems such as fever, pain, or heavy bleeding.     Miguel Aschoff, M.D.    AR/MEDQ  D:  04/06/2012  T:  04/07/2012  Job:  [id number]

## 2012-04-30 ENCOUNTER — Other Ambulatory Visit: Payer: Self-pay | Admitting: *Deleted

## 2012-04-30 DIAGNOSIS — I83893 Varicose veins of bilateral lower extremities with other complications: Secondary | ICD-10-CM

## 2012-05-14 ENCOUNTER — Encounter: Payer: Self-pay | Admitting: Family Medicine

## 2012-05-14 ENCOUNTER — Ambulatory Visit (INDEPENDENT_AMBULATORY_CARE_PROVIDER_SITE_OTHER): Payer: BC Managed Care – PPO | Admitting: Family Medicine

## 2012-05-14 VITALS — BP 134/76 | HR 92 | Temp 99.7°F | Wt 173.0 lb

## 2012-05-14 DIAGNOSIS — J019 Acute sinusitis, unspecified: Secondary | ICD-10-CM

## 2012-05-14 MED ORDER — METHYLPREDNISOLONE 4 MG PO KIT: PACK | ORAL | Status: AC

## 2012-05-14 MED ORDER — AZITHROMYCIN 250 MG PO TABS: ORAL_TABLET | ORAL | Status: DC

## 2012-05-14 NOTE — Progress Notes (Signed)
  Subjective:    Patient ID: Theresa Perez, female    DOB: 06-04-1950, 62 y.o.   MRN: [id number]  HPI Here for 5 days of sinus pressure, PND, right ear pain, ST, and a dry cough. On Sudafed.    Review of Systems  Constitutional: Negative.   HENT: Positive for congestion, postnasal drip and sinus pressure.   Eyes: Negative.   Respiratory: Positive for cough.        Objective:   Physical Exam  Constitutional: She appears well-developed and well-nourished.  HENT:  Right Ear: External ear normal.  Left Ear: External ear normal.  Nose: Nose normal.  Mouth/Throat: Oropharynx is clear and moist.  Eyes: Conjunctivae are normal.  Pulmonary/Chest: Effort normal and breath sounds normal.  Lymphadenopathy:    She has no cervical adenopathy.          Assessment & Plan:  Recheck prn.

## 2012-05-15 ENCOUNTER — Other Ambulatory Visit: Payer: Self-pay | Admitting: Internal Medicine

## 2012-05-29 ENCOUNTER — Encounter: Payer: Self-pay | Admitting: Vascular Surgery

## 2012-06-01 ENCOUNTER — Encounter: Payer: Self-pay | Admitting: Vascular Surgery

## 2012-06-01 ENCOUNTER — Ambulatory Visit (INDEPENDENT_AMBULATORY_CARE_PROVIDER_SITE_OTHER): Payer: BC Managed Care – PPO | Admitting: Vascular Surgery

## 2012-06-01 VITALS — BP 130/82 | HR 64 | Resp 16 | Ht 71.0 in | Wt 172.0 lb

## 2012-06-01 DIAGNOSIS — I83893 Varicose veins of bilateral lower extremities with other complications: Secondary | ICD-10-CM

## 2012-06-01 NOTE — Progress Notes (Signed)
Laser Ablation Procedure      Date: 06/01/2012    Theresa Perez DOB:[birth year]  Consent signed: Yes  Surgeon:J.D. Hart [place]  Procedure: Laser Ablation: right Greater Saphenous Vein  BP 130/82  Pulse 64  Resp 16  Ht 5\' 11"  (1.803 m)  Wt 172 lb (78.019 kg)  BMI 24 kg/m2  Start time: 1:00   End time: 1:40  Tumescent Anesthesia: 300 cc 0.9% NaCl with 50 cc Lidocaine HCL with 1% Epi and 15 cc 8.4% NaHCO3  Local Anesthesia: 7 cc Lidocaine HCL and NaHCO3 (ratio 2:1)  Pulsed mode:yes Watts 15 Seconds 1 Pulses:1 Total Pulses:149 Total Energy: 2232 Total Time: 2:28     Patient tolerated procedure well: Yes  Notes:   Description of Procedure:  After marking the course of the saphenous vein and the secondary varicosities in the standing position, the patient was placed on the operating table in the supine position, and the right leg was prepped and draped in sterile fashion. Local anesthetic was administered, and under ultrasound guidance the saphenous vein was accessed with a micro needle and guide wire; then the micro puncture sheath was placed. A guide wire was inserted to the saphenofemoral junction, followed by a 5 french sheath.  The position of the sheath and then the laser fiber below the junction was confirmed using the ultrasound and visualization of the aiming beam.  Tumescent anesthesia was administered along the course of the saphenous vein using ultrasound guidance. Protective laser glasses were placed on the patient, and the laser was fired at 15 watt pulsed mode advancing 1-2 mm per sec.  For a total of 2232 joules.  A steri strip was applied to the puncture site.    ABD pads and thigh high compression stockings were applied.  Ace wrap bandages were applied over the phlebectomy sites and at the top of the saphenofemoral junction.  Blood loss was less than 15 cc.  The patient ambulated out of the operating room having tolerated the procedure well.

## 2012-06-01 NOTE — Progress Notes (Signed)
Subjective:     Patient ID: Theresa Perez, female   DOB: Jun 17, 1950, 62 y.o.   MRN: [id number]  HPI this 62 year old female had laser ablation of the right great saphenous vein performed under local tumescent anesthesia for venous hypertension due to reflux in the right great saphenous system. She tolerated the procedure well. A total of 2200 J of energy was utilized   Review of Systems     Objective:   Physical Exam BP 130/82  Pulse 64  Resp 16  Ht 5\' 11"  (1.803 m)  Wt 172 lb (78.019 kg)  BMI 24 kg/m2        Assessment:    well-tolerated laser ablation of right great saphenous vein performed under local tumescent anesthesia    Plan:     Return in one week for venous duplex exam to confirm closure right great saphenous vein and within the schedule for similar procedure on the contralateral left leg

## 2012-06-02 ENCOUNTER — Encounter: Payer: Self-pay | Admitting: Vascular Surgery

## 2012-06-02 ENCOUNTER — Telehealth: Payer: Self-pay | Admitting: *Deleted

## 2012-06-02 NOTE — Telephone Encounter (Signed)
Left voice mail

## 2012-06-08 ENCOUNTER — Encounter: Payer: Self-pay | Admitting: Vascular Surgery

## 2012-06-09 ENCOUNTER — Encounter (INDEPENDENT_AMBULATORY_CARE_PROVIDER_SITE_OTHER): Payer: BC Managed Care – PPO | Admitting: *Deleted

## 2012-06-09 ENCOUNTER — Encounter: Payer: Self-pay | Admitting: Vascular Surgery

## 2012-06-09 ENCOUNTER — Ambulatory Visit (INDEPENDENT_AMBULATORY_CARE_PROVIDER_SITE_OTHER): Payer: BC Managed Care – PPO | Admitting: Vascular Surgery

## 2012-06-09 VITALS — BP 124/78 | HR 72 | Resp 16 | Ht 71.0 in | Wt 171.0 lb

## 2012-06-09 DIAGNOSIS — I83893 Varicose veins of bilateral lower extremities with other complications: Secondary | ICD-10-CM

## 2012-06-09 DIAGNOSIS — Z48812 Encounter for surgical aftercare following surgery on the circulatory system: Secondary | ICD-10-CM

## 2012-06-09 NOTE — Progress Notes (Signed)
Subjective:     Patient ID: Theresa Perez, female   DOB: May 10, 1950, 62 y.o.   MRN: [id number]  HPI this 62 year old female returns 1 week post laser ablation right great saphenous vein for venous hypertension with pain and swelling. She states that her right lower leg feels better and the veins are much less prominent and better color in her right foot. She has been wearing her long light elastic compression stocking as instructed and taking ibuprofen.  Past Medical History  Diagnosis Date  . Hypertension   . Hypothyroidism   . Allergy   . Anxiety   . Cyst of breast, right, diffuse fibrocystic   . Cancer     Thyroid-Papillary  . Complication of anesthesia   . PONV (postoperative nausea and vomiting)     History  Substance Use Topics  . Smoking status: Current Every Day Smoker -- 0.30 packs/day for 15 years    Types: Cigarettes  . Smokeless tobacco: Never Used     Comment: Occassional smoker  . Alcohol Use: Yes     Comment: Rarely    Family History  Problem Relation Age of Onset  . Diabetes Mother   . Deep vein thrombosis Mother     Varicose Veins  . Cancer Father     Father died from brain tumor.  [name] Heart disease Father   . Colon cancer Neg Hx   . Stomach cancer Neg Hx     No Known Allergies  Current outpatient prescriptions:ASCORBIC ACID PO, Take 1 tablet by mouth daily., Disp: , Rfl: ;  atenolol-chlorthalidone (TENORETIC) 50-25 MG per tablet, Take 0.5 tablets by mouth daily. , Disp: , Rfl: ;  azithromycin (ZITHROMAX) 250 MG tablet, As directed, Disp: 6 tablet, Rfl: 1;  Black Cohosh 540 MG CAPS, Take 1 capsule by mouth 2 (two) times daily., Disp: , Rfl:  butalbital-acetaminophen-caffeine (ESGIC) 50-325-40 MG per tablet, Take 1 tablet by mouth 2 (two) times daily as needed for headache., Disp: 30 tablet, Rfl: 1;  CALCIUM PO, Take 1 tablet by mouth daily., Disp: , Rfl: ;  COENZYME Q-10 PO, Take 1 tablet by mouth daily., Disp: , Rfl: ;  Cyanocobalamin (VITAMIN B-12 PO),  Take 1 tablet by mouth daily., Disp: , Rfl: ;  ECHINACEA PO, Take 1 tablet by mouth daily., Disp: , Rfl:  EVENING PRIMROSE OIL PO, Take 1 tablet by mouth daily., Disp: , Rfl: ;  ferrous sulfate 325 (65 FE) MG tablet, Take 325 mg by mouth daily with breakfast., Disp: , Rfl: ;  fluticasone (FLONASE) 50 MCG/ACT nasal spray, Place 2 sprays into the nose daily., Disp: 16 g, Rfl: 11;  Ginkgo Biloba (GNP GINGKO BILOBA EXTRACT PO), Take 1 tablet by mouth daily., Disp: , Rfl:  levothyroxine (SYNTHROID, LEVOTHROID) 175 MCG tablet, Take 175 mcg by mouth daily., Disp: , Rfl: ;  levothyroxine (SYNTHROID, LEVOTHROID) 175 MCG tablet, TAKE ONE TABLET BY MOUTH EVERY DAY, Disp: 90 tablet, Rfl: 0;  Misc Natural Products (GLUCOSAMINE CHOND COMPLEX/MSM) TABS, Take 1 tablet by mouth 2 (two) times daily., Disp: , Rfl: ;  Multiple Vitamin (ANTIOXIDANT FORMULA PO), Take 1 tablet by mouth daily., Disp: , Rfl:  Multiple Vitamins-Minerals (MULTIVITAMIN WITH MINERALS) tablet, Take 1 tablet by mouth daily., Disp: , Rfl: ;  Nutritional Supplements (DHEA PO), Take 1 tablet by mouth daily., Disp: , Rfl:   BP 124/78  Pulse 72  Resp 16  Ht 5\' 11"  (1.803 m)  Wt 171 lb (77.565 kg)  BMI 23.86 kg/m2  Body  mass index is 23.86 kg/(m^2).           Review of Systems denies chest pain, dyspnea on exertion, PND, orthopnea, hemoptysis.     Objective:   Physical Exam blood pressure 124/78 heart rate 72 respirations 16 General well-developed well-nourished female in no apparent distress alert and oriented x3 Lungs no rhonchi or wheezing Right leg with mild tenderness along the course of great saphenous vein in mid thigh to saphenofemoral junction. No distal edema noted. 3+ dorsalis pedis pulse palpable.  Today I ordered a venous duplex exam of the right leg which I reviewed and interpreted. There is no DVT. Right great saphenous vein has been successfully closed from near the saphenofemoral junction to the distal thigh     Assessment:     Successful well-tolerated laser ablation right great saphenous vein for venous hypertension    Plan:     Return next week for similar procedure and contralateral left leg

## 2012-06-12 ENCOUNTER — Encounter: Payer: Self-pay | Admitting: Vascular Surgery

## 2012-06-15 ENCOUNTER — Encounter: Payer: Self-pay | Admitting: Family Medicine

## 2012-06-15 ENCOUNTER — Other Ambulatory Visit: Payer: BC Managed Care – PPO | Admitting: Vascular Surgery

## 2012-06-15 ENCOUNTER — Ambulatory Visit (INDEPENDENT_AMBULATORY_CARE_PROVIDER_SITE_OTHER): Payer: BC Managed Care – PPO | Admitting: Vascular Surgery

## 2012-06-15 ENCOUNTER — Encounter: Payer: Self-pay | Admitting: Vascular Surgery

## 2012-06-15 ENCOUNTER — Ambulatory Visit (INDEPENDENT_AMBULATORY_CARE_PROVIDER_SITE_OTHER): Payer: BC Managed Care – PPO | Admitting: Family Medicine

## 2012-06-15 VITALS — BP 135/91 | HR 75 | Resp 16 | Ht 71.0 in | Wt 170.0 lb

## 2012-06-15 VITALS — BP 132/88 | Temp 98.0°F | Wt 170.0 lb

## 2012-06-15 DIAGNOSIS — I83893 Varicose veins of bilateral lower extremities with other complications: Secondary | ICD-10-CM

## 2012-06-15 DIAGNOSIS — L255 Unspecified contact dermatitis due to plants, except food: Secondary | ICD-10-CM

## 2012-06-15 DIAGNOSIS — L237 Allergic contact dermatitis due to plants, except food: Secondary | ICD-10-CM

## 2012-06-15 MED ORDER — PREDNISONE 20 MG PO TABS: ORAL_TABLET | ORAL | Status: DC

## 2012-06-15 NOTE — Progress Notes (Signed)
Chief Complaint  Patient presents with  . poision oak    HPI:  Acute visit for ? Poison ivy: -was out doing yard work and got into some poison ivy -has itchy vesicular rash on hands, arms, legs, face (on cheeks) -denies: fevers, oral lesions, ocular lesions, SOB, swelling of face, eye lesions  ROS: See pertinent positives and negatives per HPI.  Past Medical History  Diagnosis Date  . Hypertension   . Hypothyroidism   . Allergy   . Anxiety   . Cyst of breast, right, diffuse fibrocystic   . Cancer     Thyroid-Papillary  . Complication of anesthesia   . PONV (postoperative nausea and vomiting)     Family History  Problem Relation Age of Onset  . Diabetes Mother   . Deep vein thrombosis Mother     Varicose Veins  . Cancer Father     Father died from brain tumor.  [name] Heart disease Father   . Colon cancer Neg Hx   . Stomach cancer Neg Hx     History   Social History  . Marital Status: Married    Spouse Name: N/A    Number of Children: N/A  . Years of Education: N/A   Social History Main Topics  . Smoking status: Current Every Day Smoker -- 0.30 packs/day for 15 years    Types: Cigarettes  . Smokeless tobacco: Never Used     Comment: Occassional smoker  . Alcohol Use: Yes     Comment: Rarely  . Drug Use: No  . Sexually Active: Yes    Birth Control/ Protection: Post-menopausal   Other Topics Concern  . None   Social History Narrative  . None    Current outpatient prescriptions:ASCORBIC ACID PO, Take 1 tablet by mouth daily., Disp: , Rfl: ;  atenolol-chlorthalidone (TENORETIC) 50-25 MG per tablet, Take 0.5 tablets by mouth daily. , Disp: , Rfl: ;  azithromycin (ZITHROMAX) 250 MG tablet, As directed, Disp: 6 tablet, Rfl: 1;  Black Cohosh 540 MG CAPS, Take 1 capsule by mouth 2 (two) times daily., Disp: , Rfl:  butalbital-acetaminophen-caffeine (ESGIC) 50-325-40 MG per tablet, Take 1 tablet by mouth 2 (two) times daily as needed for headache., Disp: 30 tablet,  Rfl: 1;  CALCIUM PO, Take 1 tablet by mouth daily., Disp: , Rfl: ;  COENZYME Q-10 PO, Take 1 tablet by mouth daily., Disp: , Rfl: ;  Cyanocobalamin (VITAMIN B-12 PO), Take 1 tablet by mouth daily., Disp: , Rfl: ;  ECHINACEA PO, Take 1 tablet by mouth daily., Disp: , Rfl:  EVENING PRIMROSE OIL PO, Take 1 tablet by mouth daily., Disp: , Rfl: ;  ferrous sulfate 325 (65 FE) MG tablet, Take 325 mg by mouth daily with breakfast., Disp: , Rfl: ;  fluticasone (FLONASE) 50 MCG/ACT nasal spray, Place 2 sprays into the nose daily., Disp: 16 g, Rfl: 11;  Ginkgo Biloba (GNP GINGKO BILOBA EXTRACT PO), Take 1 tablet by mouth daily., Disp: , Rfl:  levothyroxine (SYNTHROID, LEVOTHROID) 175 MCG tablet, Take 175 mcg by mouth daily., Disp: , Rfl: ;  levothyroxine (SYNTHROID, LEVOTHROID) 175 MCG tablet, TAKE ONE TABLET BY MOUTH EVERY DAY, Disp: 90 tablet, Rfl: 0;  Misc Natural Products (GLUCOSAMINE CHOND COMPLEX/MSM) TABS, Take 1 tablet by mouth 2 (two) times daily., Disp: , Rfl: ;  Multiple Vitamin (ANTIOXIDANT FORMULA PO), Take 1 tablet by mouth daily., Disp: , Rfl:  Multiple Vitamins-Minerals (MULTIVITAMIN WITH MINERALS) tablet, Take 1 tablet by mouth daily., Disp: , Rfl: ;  Nutritional Supplements (DHEA PO), Take 1 tablet by mouth daily., Disp: , Rfl: ;  predniSONE (DELTASONE) 20 MG tablet, 60mg  (3 tabs) for 5 days, then 40mg  (2 tabs) for 5 days, then 20mg  (1 tab) for 5 days, Disp: 40 tablet, Rfl: 0  EXAM:  Filed Vitals:   06/15/12 1033  BP: 132/88  Temp: 98 F (36.7 C)    Body mass index is 23.72 kg/(m^2).  GENERAL: vitals reviewed and listed above, alert, oriented, appears well hydrated and in no acute distress  HEENT: atraumatic, conjunttiva clear, no obvious abnormalities on inspection of external nose and ears  NECK: no obvious masses on inspection  SKIN: small patches of vesiculopapular rash on hands, arms, legs, L cheek - no lesions near eyes or mouth  MS: moves all extremities without noticeable  abnormality  PSYCH: pleasant and cooperative, no obvious depression or anxiety  ASSESSMENT AND PLAN:  Discussed the following assessment and plan:  Poison ivy dermatitis - Plan: predniSONE (DELTASONE) 20 MG tablet  -risks of steroids discussed - appropriate do to lesions on face and multiple body parts, return precautions discussed -Patient advised to return or notify a doctor immediately if symptoms worsen or persist or new concerns arise.  Patient Instructions  Poison [facility] ivy is a inflammation of the skin (contact dermatitis) caused by touching the allergens on the leaves of the ivy plant following previous exposure to the plant. The rash usually appears 48 hours after exposure. The rash is usually bumps (papules) or blisters (vesicles) in a linear pattern. Depending on your own sensitivity, the rash may simply cause redness and itching, or it may also progress to blisters which may break open. These must be well cared for to prevent secondary bacterial (germ) infection, followed by scarring. Keep any open areas dry, clean, dressed, and covered with an antibacterial ointment if needed. The eyes may also get puffy. The puffiness is worst in the morning and gets better as the day progresses. This dermatitis usually heals without scarring, within 2 to 3 weeks without treatment. HOME CARE INSTRUCTIONS  Thoroughly wash with soap and water as soon as you have been exposed to poison ivy. You have about one half hour to remove the plant resin before it will cause the rash. This washing will destroy the oil or antigen on the skin that is causing, or will cause, the rash. Be sure to wash under your fingernails as any plant resin there will continue to spread the rash. Do not rub skin vigorously when washing affected area. Poison ivy cannot spread if no oil from the plant remains on your body. A rash that has progressed to weeping sores will not spread the rash unless you have not washed thoroughly.  It is also important to wash any clothes you have been wearing as these may carry active allergens. The rash will return if you wear the unwashed clothing, even several days later. Avoidance of the plant in the future is the best measure. Poison ivy plant can be recognized by the number of leaves. Generally, poison ivy has three leaves with flowering branches on a single stem. Diphenhydramine may be purchased over the counter and used as needed for itching. Do not drive with this medication if it makes you drowsy.Ask your caregiver about medication for children. SEEK MEDICAL CARE IF:  Open sores develop.  Redness spreads beyond area of rash.  You notice purulent (pus-like) discharge.  You have increased pain.  Other signs of infection develop (such as fever).  Document Released: 02/02/2000 Document Revised: 04/29/2011 Document Reviewed: 12/21/2008 [facility] Patient Information 2013 [place], [place], [place]

## 2012-06-15 NOTE — Progress Notes (Signed)
Subjective:     Patient ID: Theresa Perez, female   DOB: 1950/11/17, 62 y.o.   MRN: [id number]  HPI this 62 year old female had laser ablation of the left great saphenous vein performed under local tumescent anesthesia for a venous hypertension and pain and edema. She tolerated the procedure well. A total of 1815 J of energy was utilized.  Review of Systems     Objective:   Physical Exam BP 135/91  Pulse 75  Resp 16  Ht 5\' 11"  (1.803 m)  Wt 170 lb (77.111 kg)  BMI 23.72 kg/m2       Assessment:     Well-tolerated laser ablation left great saphenous vein performed under local tumescent anesthesia    Plan:     Return in one week for a venous duplex exam to confirm closure left great saphenous vein

## 2012-06-15 NOTE — Patient Instructions (Addendum)
Poison Ivy  Poison ivy is a inflammation of the skin (contact dermatitis) caused by touching the allergens on the leaves of the ivy plant following previous exposure to the plant. The rash usually appears 48 hours after exposure. The rash is usually bumps (papules) or blisters (vesicles) in a linear pattern. Depending on your own sensitivity, the rash may simply cause redness and itching, or it may also progress to blisters which may break open. These must be well cared for to prevent secondary bacterial (germ) infection, followed by scarring. Keep any open areas dry, clean, dressed, and covered with an antibacterial ointment if needed. The eyes may also get puffy. The puffiness is worst in the morning and gets better as the day progresses. This dermatitis usually heals without scarring, within 2 to 3 weeks without treatment.  HOME CARE INSTRUCTIONS   Thoroughly wash with soap and water as soon as you have been exposed to poison ivy. You have about one half hour to remove the plant resin before it will cause the rash. This washing will destroy the oil or antigen on the skin that is causing, or will cause, the rash. Be sure to wash under your fingernails as any plant resin there will continue to spread the rash. Do not rub skin vigorously when washing affected area. Poison ivy cannot spread if no oil from the plant remains on your body. A rash that has progressed to weeping sores will not spread the rash unless you have not washed thoroughly. It is also important to wash any clothes you have been wearing as these may carry active allergens. The rash will return if you wear the unwashed clothing, even several days later.  Avoidance of the plant in the future is the best measure. Poison ivy plant can be recognized by the number of leaves. Generally, poison ivy has three leaves with flowering branches on a single stem.  Diphenhydramine may be purchased over the counter and used as needed for itching. Do not drive with  this medication if it makes you drowsy.Ask your caregiver about medication for children.  SEEK MEDICAL CARE IF:   Open sores develop.   Redness spreads beyond area of rash.   You notice purulent (pus-like) discharge.   You have increased pain.   Other signs of infection develop (such as fever).  Document Released: 02/02/2000 Document Revised: 04/29/2011 Document Reviewed: 12/21/2008  [employer] Patient Information 2013 [employer]

## 2012-06-15 NOTE — Progress Notes (Signed)
Laser Ablation Procedure      Date: 06/15/2012    Theresa Perez DOB:[birth year]  Consent signed: Yes  Surgeon:J.D. Hart [place]  Procedure: Laser Ablation: left Greater Saphenous Vein  BP 135/91  Pulse 75  Resp 16  Ht 5\' 11"  (1.803 m)  Wt 170 lb (77.111 kg)  BMI 23.72 kg/m2  Start time: 1:00   End time: 1:40  Tumescent Anesthesia: 330 cc 0.9% NaCl with 50 cc Lidocaine HCL with 1% Epi and 15 cc 8.4% NaHCO3  Local Anesthesia: 6 cc Lidocaine HCL and NaHCO3 (ratio 2:1)  Pulsed mode:yes Watts 15 Seconds 1 Pulses:1 Total Pulses:121 Total Energy: 1815 Total Time: 2:01     Patient tolerated procedure well: Yes  Notes:   Description of Procedure:  After marking the course of the saphenous vein and the secondary varicosities in the standing position, the patient was placed on the operating table in the supine position, and the left leg was prepped and draped in sterile fashion. Local anesthetic was administered, and under ultrasound guidance the saphenous vein was accessed with a micro needle and guide wire; then the micro puncture sheath was placed. A guide wire was inserted to the saphenofemoral junction, followed by a 5 french sheath.  The position of the sheath and then the laser fiber below the junction was confirmed using the ultrasound and visualization of the aiming beam.  Tumescent anesthesia was administered along the course of the saphenous vein using ultrasound guidance. Protective laser glasses were placed on the patient, and the laser was fired at 15 watt pulsed mode advancing 1-2 mm per sec.  For a total of 1815 joules.  A steri strip was applied to the puncture site.    ABD pads and thigh high compression stockings were applied.  Ace wrap bandages were applied over the phlebectomy sites and at the top of the saphenofemoral junction.  Blood loss was less than 15 cc.  The patient ambulated out of the operating room having tolerated the procedure well.

## 2012-06-16 ENCOUNTER — Telehealth: Payer: Self-pay | Admitting: *Deleted

## 2012-06-16 ENCOUNTER — Encounter: Payer: Self-pay | Admitting: Vascular Surgery

## 2012-06-16 NOTE — Telephone Encounter (Signed)
Reminded her of her fu appt.

## 2012-06-22 ENCOUNTER — Encounter: Payer: Self-pay | Admitting: Vascular Surgery

## 2012-06-23 ENCOUNTER — Encounter: Payer: Self-pay | Admitting: Vascular Surgery

## 2012-06-23 ENCOUNTER — Encounter (INDEPENDENT_AMBULATORY_CARE_PROVIDER_SITE_OTHER): Payer: BC Managed Care – PPO | Admitting: *Deleted

## 2012-06-23 ENCOUNTER — Ambulatory Visit (INDEPENDENT_AMBULATORY_CARE_PROVIDER_SITE_OTHER): Payer: BC Managed Care – PPO | Admitting: Vascular Surgery

## 2012-06-23 VITALS — BP 136/87 | HR 74 | Resp 16 | Ht 71.0 in | Wt 170.0 lb

## 2012-06-23 DIAGNOSIS — I83893 Varicose veins of bilateral lower extremities with other complications: Secondary | ICD-10-CM

## 2012-06-23 DIAGNOSIS — Z48812 Encounter for surgical aftercare following surgery on the circulatory system: Secondary | ICD-10-CM

## 2012-06-23 NOTE — Progress Notes (Signed)
Subjective:     Patient ID: Theresa Perez, female   DOB: [birth year], 62 y.o.   MRN: [id number]  HPI this 62 year old female returns 1 week post laser ablation left great saphenous vein for venous hypertension with pain and swelling and multiple reticular veins. She tolerated the procedure well last week. She has had some mild discomfort in the proximal thigh area. There's been no change in distal edema. She has warner long-leg elastic compression stocking and taken ibuprofen as instructed.  Past Medical History  Diagnosis Date  . Hypertension   . Hypothyroidism   . Allergy   . Anxiety   . Cyst of breast, right, diffuse fibrocystic   . Cancer     Thyroid-Papillary  . Complication of anesthesia   . PONV (postoperative nausea and vomiting)     History  Substance Use Topics  . Smoking status: Current Every Day Smoker -- 0.30 packs/day for 15 years    Types: Cigarettes  . Smokeless tobacco: Never Used     Comment: Occassional smoker  . Alcohol Use: Yes     Comment: Rarely    Family History  Problem Relation Age of Onset  . Diabetes Mother   . Deep vein thrombosis Mother     Varicose Veins  . Cancer Father     Father died from brain tumor.  [name] Heart disease Father   . Colon cancer Neg Hx   . Stomach cancer Neg Hx     No Known Allergies  Current outpatient prescriptions:ASCORBIC ACID PO, Take 1 tablet by mouth daily., Disp: , Rfl: ;  atenolol-chlorthalidone (TENORETIC) 50-25 MG per tablet, Take 0.5 tablets by mouth daily. , Disp: , Rfl: ;  azithromycin (ZITHROMAX) 250 MG tablet, As directed, Disp: 6 tablet, Rfl: 1;  Black Cohosh 540 MG CAPS, Take 1 capsule by mouth 2 (two) times daily., Disp: , Rfl:  butalbital-acetaminophen-caffeine (ESGIC) 50-325-40 MG per tablet, Take 1 tablet by mouth 2 (two) times daily as needed for headache., Disp: 30 tablet, Rfl: 1;  CALCIUM PO, Take 1 tablet by mouth daily., Disp: , Rfl: ;  COENZYME Q-10 PO, Take 1 tablet by mouth daily., Disp: , Rfl: ;   Cyanocobalamin (VITAMIN B-12 PO), Take 1 tablet by mouth daily., Disp: , Rfl: ;  ECHINACEA PO, Take 1 tablet by mouth daily., Disp: , Rfl:  EVENING PRIMROSE OIL PO, Take 1 tablet by mouth daily., Disp: , Rfl: ;  ferrous sulfate 325 (65 FE) MG tablet, Take 325 mg by mouth daily with breakfast., Disp: , Rfl: ;  fluticasone (FLONASE) 50 MCG/ACT nasal spray, Place 2 sprays into the nose daily., Disp: 16 g, Rfl: 11;  Ginkgo Biloba (GNP GINGKO BILOBA EXTRACT PO), Take 1 tablet by mouth daily., Disp: , Rfl:  levothyroxine (SYNTHROID, LEVOTHROID) 175 MCG tablet, Take 175 mcg by mouth daily., Disp: , Rfl: ;  levothyroxine (SYNTHROID, LEVOTHROID) 175 MCG tablet, TAKE ONE TABLET BY MOUTH EVERY DAY, Disp: 90 tablet, Rfl: 0;  Misc Natural Products (GLUCOSAMINE CHOND COMPLEX/MSM) TABS, Take 1 tablet by mouth 2 (two) times daily., Disp: , Rfl: ;  Multiple Vitamin (ANTIOXIDANT FORMULA PO), Take 1 tablet by mouth daily., Disp: , Rfl:  Multiple Vitamins-Minerals (MULTIVITAMIN WITH MINERALS) tablet, Take 1 tablet by mouth daily., Disp: , Rfl: ;  Nutritional Supplements (DHEA PO), Take 1 tablet by mouth daily., Disp: , Rfl: ;  predniSONE (DELTASONE) 20 MG tablet, 60mg  (3 tabs) for 5 days, then 40mg  (2 tabs) for 5 days, then 20mg  (1 tab) for  5 days, Disp: 40 tablet, Rfl: 0  BP 136/87  Pulse 74  Resp 16  Ht 5\' 11"  (1.803 m)  Wt 170 lb (77.111 kg)  BMI 23.72 kg/m2  Body mass index is 23.72 kg/(m^2).          Review of Systems denies chest pain, dyspnea on exertion, PND, orthopnea, hemoptysis, claudication.     Objective:   Physical Exam blood pressure 136/87 heart rate 74 respirations 16 General well-developed well-nourished female in no apparent stress alert and oriented x3 Lungs no rhonchi or wheezing Left leg with mild tenderness along the course of proximal great saphenous vein. No edema distally. 3 posterior cells pedis pulse palpable.  Today ordered a venous duplex exam of the left leg which are  reviewed and interpreted. The left great saphenous vein was totally closed up to a 0.1 0.4 cm from the saphenofemoral junction. There is no DVT.    Assessment:     Successful bilateral laser ablation great saphenous veins were venous hypertension    Plan:     Patient will return for sclerotherapy in the near future to complete treatment sessions

## 2012-07-07 ENCOUNTER — Encounter: Payer: Self-pay | Admitting: *Deleted

## 2012-07-08 ENCOUNTER — Ambulatory Visit (INDEPENDENT_AMBULATORY_CARE_PROVIDER_SITE_OTHER): Payer: BC Managed Care – PPO | Admitting: *Deleted

## 2012-07-08 DIAGNOSIS — I781 Nevus, non-neoplastic: Secondary | ICD-10-CM

## 2012-07-08 DIAGNOSIS — I83893 Varicose veins of bilateral lower extremities with other complications: Secondary | ICD-10-CM

## 2012-07-08 NOTE — Progress Notes (Signed)
X=.3% Sotradecol administered with a 27g butterfly.  Patient received a total of 20cc.  Treated all areas of concern. Tol well. Easy access. Has one more ins covered tx. Will see her then.  Photos: yes  Compression stockings applied: yes

## 2012-07-14 ENCOUNTER — Encounter: Payer: Self-pay | Admitting: Vascular Surgery

## 2012-07-16 ENCOUNTER — Other Ambulatory Visit: Payer: Self-pay | Admitting: *Deleted

## 2012-07-16 ENCOUNTER — Other Ambulatory Visit: Payer: Self-pay | Admitting: Internal Medicine

## 2012-07-16 MED ORDER — DULOXETINE HCL 60 MG PO CPEP: ORAL_CAPSULE | ORAL | Status: DC

## 2012-07-31 ENCOUNTER — Telehealth: Payer: Self-pay | Admitting: Internal Medicine

## 2012-07-31 MED ORDER — ZOLPIDEM TARTRATE 5 MG PO TABS: 5.0000 mg | ORAL_TABLET | Freq: Every evening | ORAL | Status: DC | PRN

## 2012-07-31 NOTE — Telephone Encounter (Signed)
Patient Information:  Caller Name: Assia  Phone: [phone]  Patient: Kori, Goins  Gender: Female  DOB: 1950/08/17  Age: 62 Years  PCP: Darryll Capers (Adults only)  Office Follow Up:  Does the office need to follow up with this patient?: Yes  Instructions For The Office: Feeling anxious and having trouble sleeping. Requesting medication to help sleep.  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 Requesting medication for anxiety/sleep.  Symptoms  Reason For Call & Symptoms: Calling about restarting Cymbalta for depression/anxiety 2 weeks ago and it is not helping yet. She is still feeling anxious and is having trouble sleeping. She is wondering if Dr. Lovell Sheehan can call in med for anxiety/sleep. She is willing to keep taking Cymbalta for a few more weeks to see if it will start to help. She used it in the past and worked well.  Reviewed Health History In EMR: Yes  Reviewed Medications In EMR: Yes  Reviewed Allergies In EMR: Yes  Reviewed Surgeries / Procedures: Yes  Date of Onset of Symptoms: 07/17/2012  Treatments Tried: Cymbalta  Treatments Tried Worked: No  Guideline(s) Used:  Depression  Disposition Per Guideline:   Discuss with PCP and Callback by Nurse Today  Reason For Disposition Reached:   Started on anti-depressant medications > 2 weeks ago and not feeling any better  Advice Given:  Stay Active  Get out of your house or apartment periodically. Go on an outing with a family member or a friend. Go to the store. Go to a movie.  Become involved in community activities (e.g., church, school, clubs, parent teacher associations).  Start a new hobby.  Take a daily walk.  Call Back If:  Sadness or depression symptoms persist more than 2 weeks  You want to talk with a counselor  You feel like harming yourself  You become worse.  Patient Will Follow Care Advice:  YES

## 2012-07-31 NOTE — Telephone Encounter (Signed)
Per dr Lovell Sheehan- may have ambien 5 mgf qha prn sleep-Left message on machine For pt and med called in pharmacy

## 2012-08-10 ENCOUNTER — Telehealth: Payer: Self-pay | Admitting: Internal Medicine

## 2012-08-10 MED ORDER — LORAZEPAM 0.5 MG PO TABS: 0.5000 mg | ORAL_TABLET | Freq: Two times a day (BID) | ORAL | Status: DC | PRN

## 2012-08-10 MED ORDER — LEVOTHYROXINE SODIUM 175 MCG PO TABS: ORAL_TABLET | ORAL | Status: DC

## 2012-08-10 NOTE — Telephone Encounter (Signed)
PT is calling and stating that she is experiencing anxiety since retiring. She states that she would like to either talk to Dr. Lovell Sheehan or see him, to discuss treatment. She also states that the Ambien has not helped her, but that she recalls lorazepam helping in the past. Please assist.

## 2012-08-10 NOTE — Telephone Encounter (Signed)
done

## 2012-08-10 NOTE — Telephone Encounter (Signed)
Per dr Lovell Sheehan- may start lorazepam .5 mg tid and then see padonda in 4 weeks. Pt informed and appointment made

## 2012-08-11 ENCOUNTER — Encounter: Payer: Self-pay | Admitting: Physician Assistant

## 2012-08-11 ENCOUNTER — Telehealth: Payer: Self-pay | Admitting: *Deleted

## 2012-08-11 NOTE — Telephone Encounter (Signed)
Patient called to cancel her sclero #2 for tomorrow because she is pleased with the results from #1 and doesn't want to wear the stockings in the summer heat. Will follow prn.

## 2012-08-12 ENCOUNTER — Ambulatory Visit: Payer: BC Managed Care – PPO | Admitting: *Deleted

## 2012-10-28 ENCOUNTER — Telehealth: Payer: Self-pay | Admitting: Internal Medicine

## 2012-10-28 NOTE — Telephone Encounter (Signed)
Appointment with padonda to [facility]

## 2012-10-28 NOTE — Telephone Encounter (Signed)
Pt is calling to see if something else can be called in for her depression.  Pt states she has been taking Cymbalta since May but it does not seem to be helping her like she wants it to. Pt states she has not seen Dr Lovell Sheehan for her depression recently.  Pt denies any suicidal thoughts.  Pt wants to know if something stronger can be called in for her.  Office please follow up with patient.

## 2012-10-29 ENCOUNTER — Ambulatory Visit (INDEPENDENT_AMBULATORY_CARE_PROVIDER_SITE_OTHER): Payer: BC Managed Care – PPO | Admitting: Family

## 2012-10-29 ENCOUNTER — Encounter: Payer: Self-pay | Admitting: Family

## 2012-10-29 VITALS — BP 130/80 | HR 71 | Wt 154.0 lb

## 2012-10-29 DIAGNOSIS — E039 Hypothyroidism, unspecified: Secondary | ICD-10-CM

## 2012-10-29 DIAGNOSIS — F32A Depression, unspecified: Secondary | ICD-10-CM

## 2012-10-29 DIAGNOSIS — F411 Generalized anxiety disorder: Secondary | ICD-10-CM

## 2012-10-29 DIAGNOSIS — F3289 Other specified depressive episodes: Secondary | ICD-10-CM

## 2012-10-29 DIAGNOSIS — F329 Major depressive disorder, single episode, unspecified: Secondary | ICD-10-CM

## 2012-10-29 MED ORDER — DULOXETINE HCL 30 MG PO CPEP: 90.0000 mg | ORAL_CAPSULE | Freq: Every day | ORAL | Status: DC

## 2012-10-29 NOTE — Progress Notes (Signed)
Subjective:    Patient ID: Theresa Perez, female    DOB: [birth year], 62 y.o.   MRN: [id number]  HPI 62 year old white female, nonsmoker, patient of Dr. Lovell Sheehan is in today with complaints of increased anxiety and depression since he retired. She is currently taking Cymbalta 60 mg once daily. Patient does feels a lack of energy and motivation to do anything. In the past, she been on a higher dose of Cymbalta but is unsure of what the dosage was. She has a history of hypothyroidism. Reports increased anxiety and jitteriness.   Review of Systems  Constitutional: Negative.   HENT: Negative.   Respiratory: Negative.   Cardiovascular: Negative.   Gastrointestinal: Negative.   Musculoskeletal: Negative.   Skin: Negative.   Neurological: Negative.   Psychiatric/Behavioral: Positive for sleep disturbance and agitation. Negative for suicidal ideas and self-injury. The patient is nervous/anxious.    Past Medical History  Diagnosis Date  . Hypertension   . Hypothyroidism   . Allergy   . Anxiety   . Cyst of breast, right, diffuse fibrocystic   . Cancer     Thyroid-Papillary  . Complication of anesthesia   . PONV (postoperative nausea and vomiting)     History   Social History  . Marital Status: Married    Spouse Name: N/A    Number of Children: N/A  . Years of Education: N/A   Occupational History  . Not on file.   Social History Main Topics  . Smoking status: Current Every Day Smoker -- 0.30 packs/day for 15 years    Types: Cigarettes  . Smokeless tobacco: Never Used     Comment: Occassional smoker  . Alcohol Use: Yes     Comment: Rarely  . Drug Use: No  . Sexual Activity: Yes    Birth Control/ Protection: Post-menopausal   Other Topics Concern  . Not on file   Social History Narrative  . No narrative on file    Past Surgical History  Procedure Laterality Date  . Aspiration cyst right breast    . Uterine ablation    . Thyroidectomy  1997  . Tonsillectomy  1969   . Vaginal hysterectomy N/A 03/31/2012    Procedure: HYSTERECTOMY VAGINAL;  Surgeon: Miguel Aschoff, MD;  Location: [facility];  Service: Gynecology;  Laterality: N/A;  . Anterior and posterior repair N/A 03/31/2012    Procedure: ANTERIOR (CYSTOCELE) AND POSTERIOR REPAIR (RECTOCELE);  Surgeon: Miguel Aschoff, MD;  Location: [facility];  Service: Gynecology;  Laterality: N/A;    Family History  Problem Relation Age of Onset  . Diabetes Mother   . Deep vein thrombosis Mother     Varicose Veins  . Cancer Father     Father died from brain tumor.  [name] Heart disease Father   . Colon cancer Neg Hx   . Stomach cancer Neg Hx     No Known Allergies  Current Outpatient Prescriptions on File Prior to Visit  Medication Sig Dispense Refill  . ASCORBIC ACID PO Take 1 tablet by mouth daily.      [name] atenolol-chlorthalidone (TENORETIC) 50-25 MG per tablet Take 0.5 tablets by mouth daily.       [name] azithromycin (ZITHROMAX) 250 MG tablet As directed  6 tablet  1  . Black Cohosh 540 MG CAPS Take 1 capsule by mouth 2 (two) times daily.      . butalbital-acetaminophen-caffeine (ESGIC) 50-325-40 MG per tablet Take 1 tablet by mouth 2 (two) times daily as needed for headache.  30 tablet  1  . CALCIUM PO Take 1 tablet by mouth daily.      [name] COENZYME Q-10 PO Take 1 tablet by mouth daily.      . Cyanocobalamin (VITAMIN B-12 PO) Take 1 tablet by mouth daily.      . DULoxetine (CYMBALTA) 60 MG capsule TAKE ONE CAPSULE BY MOUTH EVERY DAY  90 capsule  3  . ECHINACEA PO Take 1 tablet by mouth daily.      [name] EVENING PRIMROSE OIL PO Take 1 tablet by mouth daily.      . ferrous sulfate 325 (65 FE) MG tablet Take 325 mg by mouth daily with breakfast.      . fluticasone (FLONASE) 50 MCG/ACT nasal spray Place 2 sprays into the nose daily.  16 g  11  . Ginkgo Biloba (GNP GINGKO BILOBA EXTRACT PO) Take 1 tablet by mouth daily.      [name] levothyroxine (SYNTHROID, LEVOTHROID) 175 MCG tablet Take 175 mcg by mouth daily.      [name] levothyroxine  (SYNTHROID, LEVOTHROID) 175 MCG tablet TAKE ONE TABLET BY MOUTH EVERY DAY  90 tablet  3  . LORazepam (ATIVAN) 0.5 MG tablet Take 1 tablet (0.5 mg total) by mouth 2 (two) times daily as needed for anxiety.  90 tablet  3  . Misc Natural Products (GLUCOSAMINE CHOND COMPLEX/MSM) TABS Take 1 tablet by mouth 2 (two) times daily.      . Multiple Vitamin (ANTIOXIDANT FORMULA PO) Take 1 tablet by mouth daily.      . Multiple Vitamins-Minerals (MULTIVITAMIN WITH MINERALS) tablet Take 1 tablet by mouth daily.      . Nutritional Supplements (DHEA PO) Take 1 tablet by mouth daily.       No current facility-administered medications on file prior to visit.    BP 130/80  Pulse 71  Wt 154 lb (69.854 kg)  BMI 21.49 kg/m2chart    Objective:   Physical Exam  Constitutional: She is oriented to person, place, and time. She appears well-developed and well-nourished.  Neck: Normal range of motion. Neck supple. No thyromegaly present.  Cardiovascular: Normal rate, regular rhythm and normal heart sounds.   Pulmonary/Chest: Effort normal and breath sounds normal.  Abdominal: Soft. Bowel sounds are normal.  Musculoskeletal: Normal range of motion.  Neurological: She is alert and oriented to person, place, and time.  Skin: Skin is warm and dry.  Psychiatric: She has a normal mood and affect.          Assessment & Plan:  Assessment: 1. Anxiety 2. Depression 3. Hypothyroidism

## 2012-10-30 ENCOUNTER — Telehealth: Payer: Self-pay

## 2012-10-30 MED ORDER — LEVOTHYROXINE SODIUM 150 MCG PO TABS: 150.0000 ug | ORAL_TABLET | Freq: Every day | ORAL | Status: DC

## 2012-10-30 NOTE — Telephone Encounter (Signed)
Message copied by Beverely Low on Fri Oct 30, 2012 11:11 AM ------      Message from: Adline Mango B      Created: Fri Oct 30, 2012  9:37 AM       Decrease thyroid medicine to 150 mcg daily. Recheck in 6 weeks. May explain increased anxiety ------

## 2012-11-04 ENCOUNTER — Other Ambulatory Visit (INDEPENDENT_AMBULATORY_CARE_PROVIDER_SITE_OTHER): Payer: BC Managed Care – PPO

## 2012-11-04 DIAGNOSIS — Z0000 Encounter for general adult medical examination without abnormal findings: Secondary | ICD-10-CM

## 2012-11-04 LAB — CBC WITH DIFFERENTIAL/PLATELET
Basophils Absolute: 0 10*3/uL (ref 0.0–0.1)
Lymphocytes Relative: 24.7 % (ref 12.0–46.0)
Monocytes Relative: 11.2 % (ref 3.0–12.0)
Neutrophils Relative %: 57.3 % (ref 43.0–77.0)
Platelets: 264 10*3/uL (ref 150.0–400.0)
RDW: 12.5 % (ref 11.5–14.6)

## 2012-11-04 LAB — BASIC METABOLIC PANEL
BUN: 8 mg/dL (ref 6–23)
Calcium: 8.6 mg/dL (ref 8.4–10.5)
Creatinine, Ser: 0.6 mg/dL (ref 0.4–1.2)
GFR: 108 mL/min (ref >60.00)
Glucose, Bld: 93 mg/dL (ref 70–99)
Sodium: 137 mEq/L (ref 135–145)

## 2012-11-04 LAB — HEPATIC FUNCTION PANEL
AST: 19 U/L (ref 0–37)
Alkaline Phosphatase: 47 U/L (ref 39–117)
Total Bilirubin: 0.7 mg/dL (ref 0.3–1.2)

## 2012-11-04 LAB — POCT URINALYSIS DIPSTICK
Ketones, UA: NEGATIVE
Protein, UA: NEGATIVE
Spec Grav, UA: 1.01
Urobilinogen, UA: 0.2
pH, UA: 6

## 2012-11-04 LAB — LIPID PANEL
LDL Cholesterol: 94 mg/dL (ref 0–99)
Total CHOL/HDL Ratio: 4
VLDL: 13.6 mg/dL (ref 0.0–40.0)

## 2012-11-04 LAB — T3, FREE: T3, Free: 2.9 pg/mL (ref 2.3–4.2)

## 2012-11-04 LAB — T4, FREE: Free T4: 1.41 ng/dL (ref 0.60–1.60)

## 2012-11-04 NOTE — Addendum Note (Signed)
Addended by: Bonnye Fava on: 11/04/2012 09:19 AM   Modules accepted: Orders

## 2012-11-18 ENCOUNTER — Encounter: Payer: BC Managed Care – PPO | Admitting: Family

## 2012-11-18 ENCOUNTER — Ambulatory Visit: Payer: BC Managed Care – PPO | Admitting: Family

## 2012-11-19 ENCOUNTER — Ambulatory Visit: Payer: BC Managed Care – PPO | Admitting: Family

## 2012-11-25 ENCOUNTER — Encounter: Payer: Self-pay | Admitting: Family

## 2012-11-25 ENCOUNTER — Other Ambulatory Visit (HOSPITAL_COMMUNITY)
Admission: RE | Admit: 2012-11-25 | Discharge: 2012-11-25 | Disposition: A | Discharge status: Home / Self Care | Payer: BC Managed Care – PPO | Source: Ambulatory Visit | Attending: Family | Admitting: Family

## 2012-11-25 ENCOUNTER — Ambulatory Visit (INDEPENDENT_AMBULATORY_CARE_PROVIDER_SITE_OTHER): Payer: BC Managed Care – PPO | Admitting: Family

## 2012-11-25 VITALS — BP 120/84 | HR 84 | Ht 70.75 in | Wt 156.0 lb

## 2012-11-25 DIAGNOSIS — Z0000 Encounter for general adult medical examination without abnormal findings: Secondary | ICD-10-CM

## 2012-11-25 DIAGNOSIS — Z124 Encounter for screening for malignant neoplasm of cervix: Secondary | ICD-10-CM

## 2012-11-25 DIAGNOSIS — Z01419 Encounter for gynecological examination (general) (routine) without abnormal findings: Secondary | ICD-10-CM | POA: Insufficient documentation

## 2012-11-25 DIAGNOSIS — E039 Hypothyroidism, unspecified: Secondary | ICD-10-CM

## 2012-11-25 MED ORDER — DULOXETINE HCL 60 MG PO CPEP: 60.0000 mg | ORAL_CAPSULE | Freq: Two times a day (BID) | ORAL | Status: DC

## 2012-11-25 NOTE — Progress Notes (Signed)
Subjective:    Patient ID: Theresa Perez, female    DOB: 05/01/1950, 62 y.o.   MRN: [id number]  HPI  62 year old white female, patient of Dr. Lovell Sheehan is in today for complete physical exam and recheck of depression and anxiety. Her last office visit we increased Cymbalta 90 mg daily. She reports feeling much better but continues to have some bouts of anxiety. Also continues to have difficulty sleeping. Would like to consider increasing the dose.   This is a routine physical examination for this healthy  Female. Reviewed all health maintenance protocols including mammography colonoscopy bone density and reviewed appropriate screening labs. Her immunization history was reviewed as well as her current medications and allergies refills of her chronic medications were given and the plan for yearly health maintenance was discussed all orders and referrals were made as appropriate.  Review of Systems  HENT: Negative.   Eyes: Negative.   Respiratory: Negative.   Cardiovascular: Negative.   Gastrointestinal: Negative.   Endocrine: Negative.   Musculoskeletal: Negative.   Skin: Negative.   Allergic/Immunologic: Negative.   Neurological: Negative.   Hematological: Negative.   Psychiatric/Behavioral: Positive for sleep disturbance and agitation. The patient is nervous/anxious.    Past Medical History  Diagnosis Date  . Hypertension   . Hypothyroidism   . Allergy   . Anxiety   . Cyst of breast, right, diffuse fibrocystic   . Cancer     Thyroid-Papillary  . Complication of anesthesia   . PONV (postoperative nausea and vomiting)     History   Social History  . Marital Status: Married    Spouse Name: N/A    Number of Children: N/A  . Years of Education: N/A   Occupational History  . Not on file.   Social History Main Topics  . Smoking status: Current Every Day Smoker -- 0.30 packs/day for 15 years    Types: Cigarettes  . Smokeless tobacco: Never Used     Comment: Occassional  smoker  . Alcohol Use: Yes     Comment: Rarely  . Drug Use: No  . Sexual Activity: Yes    Birth Control/ Protection: Post-menopausal   Other Topics Concern  . Not on file   Social History Narrative  . No narrative on file    Past Surgical History  Procedure Laterality Date  . Aspiration cyst right breast    . Uterine ablation    . Thyroidectomy  1997  . Tonsillectomy  1969  . Vaginal hysterectomy N/A 03/31/2012    Procedure: HYSTERECTOMY VAGINAL;  Surgeon: Miguel Aschoff, MD;  Location: [facility];  Service: Gynecology;  Laterality: N/A;  . Anterior and posterior repair N/A 03/31/2012    Procedure: ANTERIOR (CYSTOCELE) AND POSTERIOR REPAIR (RECTOCELE);  Surgeon: Miguel Aschoff, MD;  Location: [facility];  Service: Gynecology;  Laterality: N/A;    Family History  Problem Relation Age of Onset  . Diabetes Mother   . Deep vein thrombosis Mother     Varicose Veins  . Cancer Father     Father died from brain tumor.  [name] Heart disease Father   . Colon cancer Neg Hx   . Stomach cancer Neg Hx     No Known Allergies  Current Outpatient Prescriptions on File Prior to Visit  Medication Sig Dispense Refill  . ASCORBIC ACID PO Take 1 tablet by mouth daily.      [name] atenolol-chlorthalidone (TENORETIC) 50-25 MG per tablet Take 0.5 tablets by mouth daily.       [name]  azithromycin (ZITHROMAX) 250 MG tablet As directed  6 tablet  1  . Black Cohosh 540 MG CAPS Take 1 capsule by mouth 2 (two) times daily.      . butalbital-acetaminophen-caffeine (ESGIC) 50-325-40 MG per tablet Take 1 tablet by mouth 2 (two) times daily as needed for headache.  30 tablet  1  . CALCIUM PO Take 1 tablet by mouth daily.      [name] COENZYME Q-10 PO Take 1 tablet by mouth daily.      . Cyanocobalamin (VITAMIN B-12 PO) Take 1 tablet by mouth daily.      [name] ECHINACEA PO Take 1 tablet by mouth daily.      [name] EVENING PRIMROSE OIL PO Take 1 tablet by mouth daily.      . ferrous sulfate 325 (65 FE) MG tablet Take 325 mg by mouth daily with  breakfast.      . fluticasone (FLONASE) 50 MCG/ACT nasal spray Place 2 sprays into the nose daily.  16 g  11  . Ginkgo Biloba (GNP GINGKO BILOBA EXTRACT PO) Take 1 tablet by mouth daily.      [name] levothyroxine (SYNTHROID, LEVOTHROID) 150 MCG tablet Take 1 tablet (150 mcg total) by mouth daily.  90 tablet  0  . levothyroxine (SYNTHROID, LEVOTHROID) 175 MCG tablet Take 175 mcg by mouth daily.      [name] levothyroxine (SYNTHROID, LEVOTHROID) 175 MCG tablet TAKE ONE TABLET BY MOUTH EVERY DAY  90 tablet  3  . LORazepam (ATIVAN) 0.5 MG tablet Take 1 tablet (0.5 mg total) by mouth 2 (two) times daily as needed for anxiety.  90 tablet  3  . Misc Natural Products (GLUCOSAMINE CHOND COMPLEX/MSM) TABS Take 1 tablet by mouth 2 (two) times daily.      . Multiple Vitamin (ANTIOXIDANT FORMULA PO) Take 1 tablet by mouth daily.      . Multiple Vitamins-Minerals (MULTIVITAMIN WITH MINERALS) tablet Take 1 tablet by mouth daily.      . Nutritional Supplements (DHEA PO) Take 1 tablet by mouth daily.       No current facility-administered medications on file prior to visit.    BP 120/84  Pulse 84  Ht 5' 10.75" (1.797 m)  Wt 156 lb (70.761 kg)  BMI 21.91 kg/m2chart    Objective:   Physical Exam  Constitutional: She is oriented to person, place, and time. She appears well-developed and well-nourished.  HENT:  Head: Normocephalic and atraumatic.  Right Ear: External ear normal.  Left Ear: External ear normal.  Nose: Nose normal.  Mouth/Throat: Oropharynx is clear and moist.  Eyes: Conjunctivae are normal. Pupils are equal, round, and reactive to light.  Neck: Normal range of motion. Neck supple. No thyromegaly present.  Cardiovascular: Normal rate, regular rhythm and normal heart sounds.   Pulmonary/Chest: Effort normal and breath sounds normal.  Abdominal: Soft. Bowel sounds are normal. She exhibits no distension. There is no tenderness. There is no rebound.  Genitourinary: Vagina normal and uterus normal.  Guaiac negative stool. No vaginal discharge found.  Musculoskeletal: Normal range of motion.  Neurological: She is alert and oriented to person, place, and time. She has normal reflexes. She displays normal reflexes. No cranial nerve deficit. Coordination normal.  Skin: Skin is warm and dry.  Psychiatric: She has a normal mood and affect.          Assessment & Plan:  Assessment: 1. Complete physical exam 2. Anxiety 3. Hypothyroidism  Plan: Continue current medications except increase Cymbalta to 120  milligrams daily. Call the office if any questions or concerns. Recheck in 4 weeks and sooner as needed. Patient to schedule a mammogram. Pap smear sent.

## 2012-11-30 ENCOUNTER — Other Ambulatory Visit: Payer: Self-pay

## 2012-11-30 DIAGNOSIS — Z1231 Encounter for screening mammogram for malignant neoplasm of breast: Secondary | ICD-10-CM

## 2012-12-01 ENCOUNTER — Other Ambulatory Visit: Payer: Self-pay | Admitting: Internal Medicine

## 2012-12-25 ENCOUNTER — Ambulatory Visit
Admission: RE | Admit: 2012-12-25 | Discharge: 2012-12-25 | Disposition: A | Discharge status: Home / Self Care | Payer: BC Managed Care – PPO | Source: Ambulatory Visit

## 2012-12-25 DIAGNOSIS — Z1231 Encounter for screening mammogram for malignant neoplasm of breast: Secondary | ICD-10-CM

## 2013-01-25 ENCOUNTER — Other Ambulatory Visit: Payer: Self-pay | Admitting: Family

## 2013-02-22 ENCOUNTER — Telehealth: Payer: Self-pay | Admitting: *Deleted

## 2013-02-25 ENCOUNTER — Telehealth: Payer: Self-pay | Admitting: *Deleted

## 2013-02-25 ENCOUNTER — Telehealth: Payer: Self-pay | Admitting: Internal Medicine

## 2013-02-25 ENCOUNTER — Other Ambulatory Visit: Payer: Self-pay | Admitting: *Deleted

## 2013-02-25 DIAGNOSIS — N83209 Unspecified ovarian cyst, unspecified side: Secondary | ICD-10-CM

## 2013-02-25 DIAGNOSIS — D219 Benign neoplasm of connective and other soft tissue, unspecified: Secondary | ICD-10-CM

## 2013-02-25 NOTE — Telephone Encounter (Signed)
Please add a pelvic US order to go with the transvaginal US. Thank you!

## 2013-02-26 ENCOUNTER — Other Ambulatory Visit: Payer: Self-pay | Admitting: *Deleted

## 2013-02-26 DIAGNOSIS — N83209 Unspecified ovarian cyst, unspecified side: Secondary | ICD-10-CM

## 2013-02-26 DIAGNOSIS — D259 Leiomyoma of uterus, unspecified: Secondary | ICD-10-CM

## 2013-02-26 NOTE — Telephone Encounter (Signed)
done

## 2013-02-27 ENCOUNTER — Other Ambulatory Visit: Payer: Self-pay | Admitting: Internal Medicine

## 2013-03-03 ENCOUNTER — Other Ambulatory Visit: Payer: BC Managed Care – PPO

## 2013-03-30 ENCOUNTER — Other Ambulatory Visit: Payer: Self-pay | Admitting: Family

## 2013-04-27 ENCOUNTER — Other Ambulatory Visit: Payer: Self-pay | Admitting: Internal Medicine

## 2013-04-28 ENCOUNTER — Other Ambulatory Visit: Payer: Self-pay | Admitting: Internal Medicine

## 2013-07-26 ENCOUNTER — Telehealth: Payer: Self-pay | Admitting: Internal Medicine

## 2013-07-26 NOTE — Telephone Encounter (Signed)
[id number] - [place] ([address]), [place] is requesting re-fill on levothyroxine (SYNTHROID, LEVOTHROID) 150 MCG tablet

## 2013-07-27 MED ORDER — LEVOTHYROXINE SODIUM 150 MCG PO TABS: ORAL_TABLET | ORAL | Status: DC

## 2013-07-27 NOTE — Telephone Encounter (Signed)
Rx sent to pharmacy   

## 2013-10-28 ENCOUNTER — Encounter: Payer: Self-pay | Admitting: Family

## 2013-10-28 ENCOUNTER — Ambulatory Visit (INDEPENDENT_AMBULATORY_CARE_PROVIDER_SITE_OTHER): Payer: BC Managed Care – PPO | Admitting: Family

## 2013-10-28 ENCOUNTER — Ambulatory Visit (INDEPENDENT_AMBULATORY_CARE_PROVIDER_SITE_OTHER)
Admission: RE | Admit: 2013-10-28 | Discharge: 2013-10-28 | Disposition: A | Discharge status: Home / Self Care | Payer: BC Managed Care – PPO | Source: Ambulatory Visit | Attending: Family | Admitting: Family

## 2013-10-28 VITALS — BP 116/68 | HR 95 | Temp 99.1°F | Ht 70.5 in | Wt 157.5 lb

## 2013-10-28 DIAGNOSIS — Z720 Tobacco use: Secondary | ICD-10-CM

## 2013-10-28 DIAGNOSIS — J019 Acute sinusitis, unspecified: Secondary | ICD-10-CM

## 2013-10-28 DIAGNOSIS — J209 Acute bronchitis, unspecified: Secondary | ICD-10-CM

## 2013-10-28 DIAGNOSIS — B9689 Other specified bacterial agents as the cause of diseases classified elsewhere: Secondary | ICD-10-CM

## 2013-10-28 DIAGNOSIS — F17200 Nicotine dependence, unspecified, uncomplicated: Secondary | ICD-10-CM

## 2013-10-28 MED ORDER — METHYLPREDNISOLONE 4 MG PO KIT: PACK | ORAL | Status: AC

## 2013-10-28 MED ORDER — DOXYCYCLINE HYCLATE 100 MG PO TABS
100.0000 mg | ORAL_TABLET | Freq: Two times a day (BID) | ORAL | Status: DC
Start: 2013-10-28 — End: 2013-11-17

## 2013-10-28 NOTE — Progress Notes (Signed)
Pre visit review using our clinic review tool, if applicable. No additional management support is needed unless otherwise documented below in the visit note. 

## 2013-10-28 NOTE — Patient Instructions (Addendum)
Bronchospasm A bronchospasm is when the tubes that carry air in and out of your lungs (airways) spasm or tighten. During a bronchospasm it is hard to breathe. This is because the airways get smaller. A bronchospasm can be triggered by:  Allergies. These may be to animals, pollen, food, or mold.  Infection. This is a common cause of bronchospasm.  Exercise.  Irritants. These include pollution, cigarette smoke, strong odors, aerosol sprays, and paint fumes.  Weather changes.  Stress.  Being emotional. HOME CARE   Always have a plan for getting help. Know when to call your doctor and local emergency services (911 in the [place] Know where you can get emergency care.  Only take medicines as told by your doctor.  If you were prescribed an inhaler or nebulizer machine, ask your doctor how to use it correctly. Always use a spacer with your inhaler if you were given one.  Stay calm during an attack. Try to relax and breathe more slowly.  Control your home environment:  Change your heating and air conditioning filter at least once a month.  Limit your use of fireplaces and wood stoves.  Do not  smoke. Do not  allow smoking in your home.  Avoid perfumes and fragrances.  Get rid of pests (such as roaches and mice) and their droppings.  Throw away plants if you see mold on them.  Keep your house clean and dust free.  Replace carpet with wood, tile, or vinyl flooring. Carpet can trap dander and dust.  Use allergy-proof pillows, mattress covers, and box spring covers.  Wash bed sheets and blankets every week in hot water. Dry them in a dryer.  Use blankets that are made of polyester or cotton.  Wash hands frequently. GET HELP IF:  You have muscle aches.  You have chest pain.  The thick spit you spit or cough up (sputum) changes from clear or white to yellow, green, gray, or bloody.  The thick spit you spit or cough up gets thicker.  There are problems that may be related  to the medicine you are given such as:  A rash.  Itching.  Swelling.  Trouble breathing. GET HELP RIGHT AWAY IF:  You feel you cannot breathe or catch your breath.  You cannot stop coughing.  Your treatment is not helping you breathe better.  You have very bad chest pain. MAKE SURE YOU:   Understand these instructions.  Will watch your condition.  Will get help right away if you are not doing well or get worse. Document Released: 12/02/2008 Document Revised: 02/09/2013 Document Reviewed: 07/28/2012 [facility] Patient Information 2015 [place], [place]. This information is not intended to replace advice given to you by your health care provider. Make sure you discuss any questions you have with your health care provider.   Smoking Cessation Quitting smoking is important to your health and has many advantages. However, it is not always easy to quit since nicotine is a very addictive drug. Oftentimes, people try 3 times or more before being able to quit. This document explains the best ways for you to prepare to quit smoking. Quitting takes hard work and a lot of effort, but you can do it. ADVANTAGES OF QUITTING SMOKING  You will live longer, feel better, and live better.  Your body will feel the impact of quitting smoking almost immediately.  Within 20 minutes, blood pressure decreases. Your pulse returns to its normal level.  After 8 hours, carbon monoxide levels in the blood return  to normal. Your oxygen level increases.  After 24 hours, the chance of having a heart attack starts to decrease. Your breath, hair, and body stop smelling like smoke.  After 48 hours, damaged nerve endings begin to recover. Your sense of taste and smell improve.  After 72 hours, the body is virtually free of nicotine. Your bronchial tubes relax and breathing becomes easier.  After 2 to 12 weeks, lungs can hold more air. Exercise becomes easier and circulation improves.  The risk of having a  heart attack, stroke, cancer, or lung disease is greatly reduced.  After 1 year, the risk of coronary heart disease is cut in half.  After 5 years, the risk of stroke falls to the same as a nonsmoker.  After 10 years, the risk of lung cancer is cut in half and the risk of other cancers decreases significantly.  After 15 years, the risk of coronary heart disease drops, usually to the level of a nonsmoker.  If you are pregnant, quitting smoking will improve your chances of having a healthy baby.  The people you live with, especially any children, will be healthier.  You will have extra money to spend on things other than cigarettes. QUESTIONS TO THINK ABOUT BEFORE ATTEMPTING TO QUIT You may want to talk about your answers with your health care provider.  Why do you want to quit?  If you tried to quit in the past, what helped and what did not?  What will be the most difficult situations for you after you quit? How will you plan to handle them?  Who can help you through the tough times? Your family? Friends? A health care provider?  What pleasures do you get from smoking? What ways can you still get pleasure if you quit? Here are some questions to ask your health care provider:  How can you help me to be successful at quitting?  What medicine do you think would be best for me and how should I take it?  What should I do if I need more help?  What is smoking withdrawal like? How can I get information on withdrawal? GET READY  Set a quit date.  Change your environment by getting rid of all cigarettes, ashtrays, matches, and lighters in your home, car, or work. Do not let people smoke in your home.  Review your past attempts to quit. Think about what worked and what did not. GET SUPPORT AND ENCOURAGEMENT You have a better chance of being successful if you have help. You can get support in many ways.  Tell your family, friends, and coworkers that you are going to quit and need  their support. Ask them not to smoke around you.  Get individual, group, or telephone counseling and support. Programs are available at [employer] and health centers. Call your local health department for information about programs in your area.  Spiritual beliefs and practices may help some smokers quit.  Download a "quit meter" on your computer to keep track of quit statistics, such as how long you have gone without smoking, cigarettes not smoked, and money saved.  Get a self-help book about quitting smoking and staying off tobacco. [place] yourself from urges to smoke. Talk to someone, go for a walk, or occupy your time with a task.  Change your normal routine. Take a different route to work. Drink tea instead of coffee. Eat breakfast in a different place.  Reduce your stress. Take a hot  bath, exercise, or read a book.  Plan something enjoyable to do every day. Reward yourself for not smoking.  Explore interactive web-based programs that specialize in helping you quit. GET MEDICINE AND USE IT CORRECTLY Medicines can help you stop smoking and decrease the urge to smoke. Combining medicine with the above behavioral methods and support can greatly increase your chances of successfully quitting smoking.  Nicotine replacement therapy helps deliver nicotine to your body without the negative effects and risks of smoking. Nicotine replacement therapy includes nicotine gum, lozenges, inhalers, nasal sprays, and skin patches. Some may be available over-the-counter and others require a prescription.  Antidepressant medicine helps people abstain from smoking, but how this works is unknown. This medicine is available by prescription.  Nicotinic receptor partial agonist medicine simulates the effect of nicotine in your brain. This medicine is available by prescription. Ask your health care provider for advice about which medicines to use and how to use them  based on your health history. Your health care provider will tell you what side effects to look out for if you choose to be on a medicine or therapy. Carefully read the information on the package. Do not use any other product containing nicotine while using a nicotine replacement product.  RELAPSE OR DIFFICULT SITUATIONS Most relapses occur within the first 3 months after quitting. Do not be discouraged if you start smoking again. Remember, most people try several times before finally quitting. You may have symptoms of withdrawal because your body is used to nicotine. You may crave cigarettes, be irritable, feel very hungry, cough often, get headaches, or have difficulty concentrating. The withdrawal symptoms are only temporary. They are strongest when you first quit, but they will go away within 10-14 days. To reduce the chances of relapse, try to:  Avoid drinking alcohol. Drinking lowers your chances of successfully quitting.  Reduce the amount of caffeine you consume. Once you quit smoking, the amount of caffeine in your body increases and can give you symptoms, such as a rapid heartbeat, sweating, and anxiety.  Avoid smokers because they can make you want to smoke.  Do not let weight gain distract you. Many smokers will gain weight when they quit, usually less than 10 pounds. Eat a healthy diet and stay active. You can always lose the weight gained after you quit.  Find ways to improve your mood other than smoking. FOR MORE INFORMATION  www.smokefree.gov  Document Released: 01/29/2001 Document Revised: 06/21/2013 Document Reviewed: 05/16/2011 [facility] Patient Information 2015 [place], [place]. This information is not intended to replace advice given to you by your health care provider. Make sure you discuss any questions you have with your health care provider.

## 2013-10-29 ENCOUNTER — Encounter: Payer: Self-pay | Admitting: Family

## 2013-10-29 NOTE — Progress Notes (Signed)
Subjective:    Patient ID: Theresa Perez, female    DOB: 08/28/1950, 63 y.o.   MRN: [id number]  HPI 63 year old  White female, nonsmoker, is in today with c/o a cough, bodyaches, sinus pressure, fatigue, SOB x 2 weeks. OTC Sudafed was working at one point but symptoms returned, worse.  Reports having wheezing particularly at night.    Review of Systems  Constitutional: Positive for fatigue.  HENT: Positive for congestion, postnasal drip, rhinorrhea, sneezing and sore throat.   Respiratory: Positive for cough.   Cardiovascular: Negative.   Gastrointestinal: Negative.   Endocrine: Negative.   Genitourinary: Negative.   Musculoskeletal: Negative.   Skin: Negative.   Allergic/Immunologic: Negative.   Neurological: Negative.   Hematological: Negative.   Psychiatric/Behavioral: Negative.    Past Medical History  Diagnosis Date  . Hypertension   . Hypothyroidism   . Allergy   . Anxiety   . Cyst of breast, right, diffuse fibrocystic   . Cancer     Thyroid-Papillary  . Complication of anesthesia   . PONV (postoperative nausea and vomiting)     History   Social History  . Marital Status: Married    Spouse Name: N/A    Number of Children: N/A  . Years of Education: N/A   Occupational History  . Not on file.   Social History Main Topics  . Smoking status: Current Every Day Smoker -- 0.30 packs/day for 15 years    Types: Cigarettes  . Smokeless tobacco: Never Used     Comment: Occassional smoker  . Alcohol Use: Yes     Comment: Rarely  . Drug Use: No  . Sexual Activity: Yes    Birth Control/ Protection: Post-menopausal   Other Topics Concern  . Not on file   Social History Narrative  . No narrative on file    Past Surgical History  Procedure Laterality Date  . Aspiration cyst right breast    . Uterine ablation    . Thyroidectomy  1997  . Tonsillectomy  1969  . Vaginal hysterectomy N/A 03/31/2012    Procedure: HYSTERECTOMY VAGINAL;  Surgeon: Gus Height,  MD;  Location: [facility];  Service: Gynecology;  Laterality: N/A;  . Anterior and posterior repair N/A 03/31/2012    Procedure: ANTERIOR (CYSTOCELE) AND POSTERIOR REPAIR (RECTOCELE);  Surgeon: Gus Height, MD;  Location: [facility];  Service: Gynecology;  Laterality: N/A;    Family History  Problem Relation Age of Onset  . Diabetes Mother   . Deep vein thrombosis Mother     Varicose Veins  . Cancer Father     Father died from brain tumor.  [name] Heart disease Father   . Colon cancer Neg Hx   . Stomach cancer Neg Hx     No Known Allergies  Current Outpatient Prescriptions on File Prior to Visit  Medication Sig Dispense Refill  . ASCORBIC ACID PO Take 1 tablet by mouth daily.      [name] atenolol-chlorthalidone (TENORETIC) 50-25 MG per tablet TAKE ONE TABLET BY MOUTH EVERY DAY  90 tablet  3  . Black Cohosh 540 MG CAPS Take 1 capsule by mouth 2 (two) times daily.      . butalbital-acetaminophen-caffeine (ESGIC) 50-325-40 MG per tablet Take 1 tablet by mouth 2 (two) times daily as needed for headache.  30 tablet  1  . CALCIUM PO Take 1 tablet by mouth daily.      [name] COENZYME Q-10 PO Take 1 tablet by mouth daily.      [name]  Cyanocobalamin (VITAMIN B-12 PO) Take 1 tablet by mouth daily.      . DULoxetine (CYMBALTA) 60 MG capsule TAKE ONE CAPSULE BY MOUTH TWICE DAILY  60 capsule  0  . ECHINACEA PO Take 1 tablet by mouth daily.      [name] EVENING PRIMROSE OIL PO Take 1 tablet by mouth daily.      . ferrous sulfate 325 (65 FE) MG tablet Take 325 mg by mouth daily with breakfast.      . fluticasone (FLONASE) 50 MCG/ACT nasal spray USE TWO SPRAY IN EACH NOSTRIL EVERY DAY  16 g  11  . Ginkgo Biloba (GNP GINGKO BILOBA EXTRACT PO) Take 1 tablet by mouth daily.      [name] levothyroxine (SYNTHROID, LEVOTHROID) 150 MCG tablet TAKE ONE TABLET BY MOUTH ONCE DAILY  90 tablet  1  . LORazepam (ATIVAN) 0.5 MG tablet TAKE ONE TABLET BY MOUTH THREE TIMES DAILY  90 tablet  5  . Misc Natural Products (GLUCOSAMINE CHOND COMPLEX/MSM) TABS  Take 1 tablet by mouth 2 (two) times daily.      . Multiple Vitamin (ANTIOXIDANT FORMULA PO) Take 1 tablet by mouth daily.      . Multiple Vitamins-Minerals (MULTIVITAMIN WITH MINERALS) tablet Take 1 tablet by mouth daily.      . Nutritional Supplements (DHEA PO) Take 1 tablet by mouth daily.       No current facility-administered medications on file prior to visit.    BP 116/68  Pulse 95  Temp(Src) 99.1 F (37.3 C) (Oral)  Ht 5' 10.5" (1.791 m)  Wt 157 lb 8 oz (71.442 kg)  BMI 22.27 kg/m2  SpO2 99%chart    Objective:   Physical Exam  Constitutional: She is oriented to person, place, and time. She appears well-developed and well-nourished.  HENT:  Right Ear: External ear normal.  Left Ear: External ear normal.  Nose: Nose normal.  Maxillary sinus tenderness   Neck: Normal range of motion. Neck supple. No thyromegaly present.  Cardiovascular: Normal rate, regular rhythm and normal heart sounds.   Pulmonary/Chest: Effort normal. She has wheezes. She has rales.  Left lower lobe  Musculoskeletal: Normal range of motion.  Neurological: She is alert and oriented to person, place, and time.  Skin: Skin is warm and dry.  Psychiatric: She has a normal mood and affect.          Assessment & Plan:  Yalonda was seen today for no specified reason.  Diagnoses and associated orders for this visit:  Acute bacterial sinusitis  Acute bronchitis, unspecified organism - DG Chest 2 View; Future  Tobacco abuse  Other Orders - doxycycline (VIBRA-TABS) 100 MG tablet; Take 1 tablet (100 mg total) by mouth 2 (two) times daily. - methylPREDNISolone (MEDROL DOSEPAK) 4 MG tablet; follow package directions   Call the office with any questions or concerns. Recheck as scheduled and as needed.

## 2013-11-16 ENCOUNTER — Telehealth: Payer: Self-pay | Admitting: Internal Medicine

## 2013-11-16 NOTE — Telephone Encounter (Signed)
Patient Information:  Caller Name: Lavana  Phone: [phone]  Patient: Theresa Perez, Theresa Perez  Gender: Female  DOB: 1951/02/05  Age: 63 Years  PCP: Roxy [facility])  Office Follow Up:  Does the office need to follow up with this patient?: No  Instructions For The Office: N/A  RN Note:  Concerned about possible plurisy. Currently walking around at [facility]. Right sided chest pain rated 5-6/10.  Chest pain worsens with deep breath, movement or touch at top of rib cage.  More easily fatigued. Advised to call 911 now for chest pain lasting longer than 5 mintues now with history of HTN and age > 72 yrs per Chest Pain. 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 RN reinforced the MD criteria stresses urgency for evaluation; > risk of MI, PE, or death. Reports will discuss with husband; plans to go to [facility] ED either tonight 11/16/13 or tomorrow 11/17/13.  Symptoms  Reason For Call & Symptoms: Constant chest pain between breast and ribs on right side.  Origionally, chest pain was generalised to entire chest but left sided discomfort resolved.  On 10/28/13, treated for pneumonia .  Completed steroids and antibiotics 11/10/13.  Reviewed Health History In EMR: Yes  Reviewed Medications In EMR: Yes  Reviewed Allergies In EMR: Yes  Reviewed Surgeries / Procedures: Yes  Date of Onset of Symptoms: 11/10/2013  Guideline(s) Used:  Chest Pain  Disposition Per Guideline:   Call EMS 911 Now  Reason For Disposition Reached:   Chest pain lasting longer than 5 minutes and ANY of the following:  Over 65 years old Over 72 years old and at least one cardiac risk factor (i.e., high blood pressure, diabetes, high cholesterol, obesity, smoker or strong family history of heart disease) Pain is crushing, pressure-like, or heavy  Took nitroglycerin and chest pain was not relieved History of  heart disease (i.e., angina, heart attack, bypass surgery, angioplasty, CHF)  Advice Given:  N/A  Patient Refused Recommendation:  Patient Will Go To [facility]  Plans ED eval either 11/16/13 or 11/17/13.

## 2013-11-16 NOTE — Telephone Encounter (Signed)
Noted  

## 2013-11-17 ENCOUNTER — Emergency Department (HOSPITAL_COMMUNITY): Payer: BC Managed Care – PPO

## 2013-11-17 ENCOUNTER — Encounter (HOSPITAL_COMMUNITY): Payer: Self-pay | Admitting: Emergency Medicine

## 2013-11-17 ENCOUNTER — Emergency Department (HOSPITAL_COMMUNITY)
Admission: EM | Admit: 2013-11-17 | Discharge: 2013-11-17 | Disposition: A | Discharge status: Home / Self Care | Payer: BC Managed Care – PPO | Attending: Emergency Medicine | Admitting: Emergency Medicine

## 2013-11-17 DIAGNOSIS — IMO0002 Reserved for concepts with insufficient information to code with codable children: Secondary | ICD-10-CM | POA: Insufficient documentation

## 2013-11-17 DIAGNOSIS — I10 Essential (primary) hypertension: Secondary | ICD-10-CM | POA: Insufficient documentation

## 2013-11-17 DIAGNOSIS — F411 Generalized anxiety disorder: Secondary | ICD-10-CM | POA: Diagnosis not present

## 2013-11-17 DIAGNOSIS — R059 Cough, unspecified: Secondary | ICD-10-CM | POA: Diagnosis not present

## 2013-11-17 DIAGNOSIS — Z85850 Personal history of malignant neoplasm of thyroid: Secondary | ICD-10-CM | POA: Diagnosis not present

## 2013-11-17 DIAGNOSIS — Z79899 Other long term (current) drug therapy: Secondary | ICD-10-CM | POA: Diagnosis not present

## 2013-11-17 DIAGNOSIS — R0789 Other chest pain: Secondary | ICD-10-CM

## 2013-11-17 DIAGNOSIS — Z8742 Personal history of other diseases of the female genital tract: Secondary | ICD-10-CM | POA: Diagnosis not present

## 2013-11-17 DIAGNOSIS — E039 Hypothyroidism, unspecified: Secondary | ICD-10-CM | POA: Insufficient documentation

## 2013-11-17 DIAGNOSIS — R079 Chest pain, unspecified: Secondary | ICD-10-CM | POA: Insufficient documentation

## 2013-11-17 DIAGNOSIS — R05 Cough: Secondary | ICD-10-CM | POA: Diagnosis not present

## 2013-11-17 DIAGNOSIS — F17200 Nicotine dependence, unspecified, uncomplicated: Secondary | ICD-10-CM | POA: Diagnosis not present

## 2013-11-17 DIAGNOSIS — R071 Chest pain on breathing: Secondary | ICD-10-CM | POA: Insufficient documentation

## 2013-11-17 LAB — CBC WITH DIFFERENTIAL/PLATELET
BASOS ABS: 0 10*3/uL (ref 0.0–0.1)
Basophils Relative: 1 % (ref 0–1)
EOS ABS: 0.2 10*3/uL (ref 0.0–0.7)
Eosinophils Relative: 4 % (ref 0–5)
HCT: 39.3 % (ref 36.0–46.0)
HEMOGLOBIN: 13.4 g/dL (ref 12.0–15.0)
Lymphocytes Relative: 39 % (ref 12–46)
Lymphs Abs: 1.9 10*3/uL (ref 0.7–4.0)
MCH: 30.1 pg (ref 26.0–34.0)
MCHC: 34.1 g/dL (ref 30.0–36.0)
MCV: 88.3 fL (ref 78.0–100.0)
MONOS PCT: 11 % (ref 3–12)
Monocytes Absolute: 0.5 10*3/uL (ref 0.1–1.0)
NEUTROS PCT: 45 % (ref 43–77)
Neutro Abs: 2.3 10*3/uL (ref 1.7–7.7)
Platelets: 267 10*3/uL (ref 150–400)
RBC: 4.45 MIL/uL (ref 3.87–5.11)
RDW: 12.6 % (ref 11.5–15.5)
WBC: 5 10*3/uL (ref 4.0–10.5)

## 2013-11-17 LAB — BASIC METABOLIC PANEL
Anion gap: 13 (ref 5–15)
BUN: 10 mg/dL (ref 6–23)
CALCIUM: 8.9 mg/dL (ref 8.4–10.5)
CO2: 27 mEq/L (ref 19–32)
Chloride: 101 mEq/L (ref 96–112)
Creatinine, Ser: 0.56 mg/dL (ref 0.50–1.10)
GFR calc Af Amer: >90 mL/min (ref >90)
GLUCOSE: 95 mg/dL (ref 70–99)
POTASSIUM: 3.3 meq/L — AB (ref 3.7–5.3)
SODIUM: 141 meq/L (ref 137–147)

## 2013-11-17 LAB — I-STAT TROPONIN, ED: Troponin i, poc: 0 ng/mL (ref 0.00–0.08)

## 2013-11-17 MED ORDER — IBUPROFEN 200 MG PO TABS: 400.0000 mg | ORAL_TABLET | Freq: Three times a day (TID) | ORAL | Status: DC | PRN

## 2013-11-17 MED ORDER — ACETAMINOPHEN 500 MG PO TABS: 500.0000 mg | ORAL_TABLET | Freq: Four times a day (QID) | ORAL | Status: DC | PRN

## 2013-11-17 NOTE — ED Notes (Signed)
Pt monitored by pulse ox, bp cuff, and 5-lead. 

## 2013-11-17 NOTE — ED Notes (Addendum)
Pt left without being discharged by RN. Pt left without discharge instructions.

## 2013-11-17 NOTE — ED Provider Notes (Signed)
CSN: [id number]     Arrival date & time 11/17/13  1420 History   First MD Initiated Contact with Patient 11/17/13 1638     Chief Complaint  Patient presents with  . Pneumonia     (Consider location/radiation/quality/duration/timing/severity/associated sxs/prior Treatment) HPI 63 year old female presents with one week of lower left and right chest pain. This hurts all the time since it worse with inspiration or cough. She is recently recovering from a pneumonia. She took doxycycline for one week and feels that her productive cough and shortness of breath resolved. She never had to come into the hospital. She's never noticed leg swelling or leg pain. The cough is only mild at this point. The pain is worse in her right lower chest but also present in her left lower chest. It hurts when she presses on it. Denies any recurrent fevers or chills. No current shortness of breath. Pain is moderate. She is worried that she has pleurisy.  Past Medical History  Diagnosis Date  . Hypertension   . Hypothyroidism   . Allergy   . Anxiety   . Cyst of breast, right, diffuse fibrocystic   . Cancer     Thyroid-Papillary  . Complication of anesthesia   . PONV (postoperative nausea and vomiting)    Past Surgical History  Procedure Laterality Date  . Aspiration cyst right breast    . Uterine ablation    . Thyroidectomy  1997  . Tonsillectomy  1969  . Vaginal hysterectomy N/A 03/31/2012    Procedure: HYSTERECTOMY VAGINAL;  Surgeon: Gus Height, MD;  Location: [facility];  Service: Gynecology;  Laterality: N/A;  . Anterior and posterior repair N/A 03/31/2012    Procedure: ANTERIOR (CYSTOCELE) AND POSTERIOR REPAIR (RECTOCELE);  Surgeon: Gus Height, MD;  Location: [facility];  Service: Gynecology;  Laterality: N/A;   Family History  Problem Relation Age of Onset  . Diabetes Mother   . Deep vein thrombosis Mother     Varicose Veins  . Cancer Father     Father died from brain tumor.  [name] Heart disease Father   .  Colon cancer Neg Hx   . Stomach cancer Neg Hx    History  Substance Use Topics  . Smoking status: Current Every Day Smoker -- 0.30 packs/day for 15 years    Types: Cigarettes  . Smokeless tobacco: Never Used     Comment: Occassional smoker  . Alcohol Use: Yes     Comment: Rarely   OB History   Grav Para Term Preterm Abortions TAB SAB Ect Mult Living                 Review of Systems  Constitutional: Negative for fever.  Respiratory: Positive for cough. Negative for shortness of breath.   Cardiovascular: Positive for chest pain. Negative for leg swelling.  Gastrointestinal: Negative for abdominal pain.  All other systems reviewed and are negative.     Allergies  Review of patient's allergies indicates no known allergies.  Home Medications   Prior to Admission medications   Medication Sig Start Date End Date Taking? Authorizing Provider  Ascorbic Acid (VITAMIN C PO) Take 1 tablet by mouth daily.   Yes Historical Provider, MD  atenolol-chlorthalidone (TENORETIC) 50-25 MG per tablet Take 0.5 tablets by mouth daily.   Yes Historical Provider, MD  Black Cohosh 540 MG CAPS Take 540 mg by mouth 2 (two) times daily.    Yes Historical Provider, MD  butalbital-acetaminophen-caffeine (ESGIC) 50-325-40 MG per tablet Take 1 tablet  by mouth 2 (two) times daily as needed for headache. 02/18/12  Yes Ricard Dillon, MD  CALCIUM PO Take 1 tablet by mouth daily.   Yes Historical Provider, MD  COENZYME Q-10 PO Take 1 tablet by mouth daily.   Yes Historical Provider, MD  Cyanocobalamin (VITAMIN B-12 PO) Take 1 tablet by mouth daily.   Yes Historical Provider, MD  DULoxetine (CYMBALTA) 60 MG capsule Take 60 mg by mouth 2 (two) times daily.   Yes Historical Provider, MD  ECHINACEA PO Take 1 tablet by mouth daily.   Yes Historical Provider, MD  EVENING PRIMROSE OIL PO Take 1 tablet by mouth daily.   Yes Historical Provider, MD  ferrous sulfate 325 (65 FE) MG tablet Take 325 mg by mouth every  evening.    Yes Historical Provider, MD  fluticasone (FLONASE) 50 MCG/ACT nasal spray Place 2 sprays into both nostrils daily.   Yes Historical Provider, MD  Ginkgo Biloba (GNP GINGKO BILOBA EXTRACT PO) Take 1 tablet by mouth daily.   Yes Historical Provider, MD  levothyroxine (SYNTHROID, LEVOTHROID) 150 MCG tablet Take 150 mcg by mouth daily before breakfast.   Yes Historical Provider, MD  LORazepam (ATIVAN) 0.5 MG tablet Take 0.5 mg by mouth every 8 (eight) hours as needed for anxiety.   Yes Historical Provider, MD  Misc Natural Products (GLUCOSAMINE CHOND COMPLEX/MSM) TABS Take 1 tablet by mouth 2 (two) times daily.   Yes Historical Provider, MD  Multiple Vitamin (ANTIOXIDANT FORMULA PO) Take 1 tablet by mouth daily.   Yes Historical Provider, MD  Multiple Vitamins-Minerals (MULTIVITAMIN WITH MINERALS) tablet Take 1 tablet by mouth daily.   Yes Historical Provider, MD  Nutritional Supplements (DHEA PO) Take 1 tablet by mouth daily.   Yes Historical Provider, MD  Soft Lens Products (SALINE SENSITIVE EYES) SOLN Place 1 drop into both eyes daily.   Yes Historical Provider, MD  acetaminophen (TYLENOL) 500 MG tablet Take 1-2 tablets (500-1,000 mg total) by mouth every 6 (six) hours as needed for moderate pain. 11/17/13   Ephraim Hamburger, MD  ibuprofen (ADVIL,MOTRIN) 200 MG tablet Take 2-3 tablets (400-600 mg total) by mouth every 8 (eight) hours as needed for moderate pain. 11/17/13   Ephraim Hamburger, MD   BP 142/85  Pulse 70  Temp(Src) 98 F (36.7 C) (Oral)  Resp 16  SpO2 100% Physical Exam  Nursing note and vitals reviewed. Constitutional: She is oriented to person, place, and time. She appears well-developed and well-nourished. No distress.  HENT:  Head: Normocephalic and atraumatic.  Right Ear: External ear normal.  Left Ear: External ear normal.  Nose: Nose normal.  Eyes: Right eye exhibits no discharge. Left eye exhibits no discharge.  Cardiovascular: Normal rate, regular rhythm and  normal heart sounds.   Pulmonary/Chest: Effort normal and breath sounds normal. She has no wheezes. She has no rales. She exhibits tenderness.    Abdominal: Soft. She exhibits no distension. There is no tenderness.  Neurological: She is alert and oriented to person, place, and time.  Skin: Skin is warm and dry. She is not diaphoretic.    ED Course  Procedures (including critical care time) Labs Review Labs Reviewed  BASIC METABOLIC PANEL - Abnormal; Notable for the following:    Potassium 3.3 (*)    All other components within normal limits  CBC WITH DIFFERENTIAL  Randolm Idol, ED    Imaging Review Dg Chest 2 View  11/17/2013   CLINICAL DATA:  Lower RIGHT-sided chest pain for  1 week, pneumonia 3 weeks ago, history hypertension, smoking  EXAM: CHEST  2 VIEW  COMPARISON:  10/28/2013  FINDINGS: Upper normal heart size.  Mediastinal contours and pulmonary vascularity normal.  Emphysematous changes and bronchitic changes consistent with COPD.  Minimal linear subsegmental atelectasis or scarring at LEFT base.  No pulmonary infiltrate, pleural effusion, or pneumothorax.  No acute osseous findings.  IMPRESSION: Minimal linear atelectasis versus scarring at LEFT base.  Underlying COPD.  Resolution of LEFT lower lobe consolidation versus previous study.   Electronically Signed   By: Lavonia Dana M.D.   On: 11/17/2013 15:57     EKG Interpretation None      Date: 11/17/2013  Rate: 72  Rhythm: normal sinus rhythm  QRS Axis: normal  Intervals: normal  ST/T Wave abnormalities: normal  Conduction Disutrbances:none  Narrative Interpretation:   Old EKG Reviewed: unchanged   MDM   Final diagnoses:  Chest wall pain    Patient's history is consistent with chest wall pain after having pneumonia a few weeks ago. I have very low suspicion this is ACS given the characteristics were seen. She does have pleuritic pain but it is bilateral and after having multiple weeks of coughing. I have low  suspicion this is a PE. She has no hypoxia or tachycardia. At this point I feel she is stable for outpatient treatment with ibuprofen and Tylenol and followup with her PCP.    Ephraim Hamburger, MD 11/17/13 [phone]

## 2013-11-17 NOTE — Discharge Instructions (Signed)
Chest Wall Pain Chest wall pain is pain in or around the bones and muscles of your chest. It may take up to 6 weeks to get better. It may take longer if you must stay physically active in your work and activities.  CAUSES  Chest wall pain may happen on its own. However, it may be caused by:  A viral illness like the flu.  Injury.  Coughing.  Exercise.  Arthritis.  Fibromyalgia.  Shingles. HOME CARE INSTRUCTIONS   Avoid overtiring physical activity. Try not to strain or perform activities that cause pain. This includes any activities using your chest or your abdominal and side muscles, especially if heavy weights are used.  Put ice on the sore area.  Put ice in a plastic bag.  Place a towel between your skin and the bag.  Leave the ice on for 15-20 minutes per hour while awake for the first 2 days.  Only take over-the-counter or prescription medicines for pain, discomfort, or fever as directed by your caregiver. SEEK IMMEDIATE MEDICAL CARE IF:   Your pain increases, or you are very uncomfortable.  You have a fever.  Your chest pain becomes worse.  You have new, unexplained symptoms.  You have nausea or vomiting.  You feel sweaty or lightheaded.  You have a cough with phlegm (sputum), or you cough up blood. MAKE SURE YOU:   Understand these instructions.  Will watch your condition.  Will get help right away if you are not doing well or get worse. Document Released: 02/04/2005 Document Revised: 04/29/2011 Document Reviewed: 10/01/2010 [facility] Patient Information 2015 [place], [place]. This information is not intended to replace advice given to you by your health care provider. Make sure you discuss any questions you have with your health care provider.     Pleurisy Pleurisy is an inflammation and swelling of the lining of the lungs (pleura). Because of this inflammation, it hurts to breathe. It can be aggravated by coughing, laughing, or deep breathing.  Pleurisy is often caused by an underlying infection or disease.  HOME CARE INSTRUCTIONS  Monitor your pleurisy for any changes. The following actions may help to alleviate any discomfort you are experiencing:  Medicine may help with pain. Only take over-the-counter or prescription medicines for pain, discomfort, or fever as directed by your health care provider.  Only take antibiotic medicine as directed. Make sure to finish it even if you start to feel better. SEEK MEDICAL CARE IF:   Your pain is not controlled with medicine or is increasing.  You have an increase in pus-like (purulent) secretions brought up with coughing. SEEK IMMEDIATE MEDICAL CARE IF:   You have blue or dark lips, fingernails, or toenails.  You are coughing up blood.  You have increased difficulty breathing.  You have continuing pain unrelieved by medicine or pain lasting more than 1 week.  You have pain that radiates into your neck, arms, or jaw.  You develop increased shortness of breath or wheezing.  You develop a fever, rash, vomiting, fainting, or other serious symptoms. MAKE SURE YOU:  Understand these instructions.   Will watch your condition.   Will get help right away if you are not doing well or get worse.  Document Released: 02/04/2005 Document Revised: 10/07/2012 Document Reviewed: 07/19/2012 [facility] Patient Information 2015 [place], [place]. This information is not intended to replace advice given to you by your health care provider. Make sure you discuss any questions you have with your health care provider.

## 2013-11-17 NOTE — ED Notes (Signed)
Being treated for pna. Told to come back if not resolved. Co lower chest wall pain and diaphragm pain on inspiration. Occ. Productive cough - green.

## 2013-12-01 ENCOUNTER — Other Ambulatory Visit (INDEPENDENT_AMBULATORY_CARE_PROVIDER_SITE_OTHER): Payer: BC Managed Care – PPO

## 2013-12-01 DIAGNOSIS — Z0000 Encounter for general adult medical examination without abnormal findings: Secondary | ICD-10-CM

## 2013-12-01 LAB — CBC WITH DIFFERENTIAL/PLATELET
BASOS ABS: 0 10*3/uL (ref 0.0–0.1)
Basophils Relative: 0.7 % (ref 0.0–3.0)
EOS ABS: 0.3 10*3/uL (ref 0.0–0.7)
Eosinophils Relative: 4.2 % (ref 0.0–5.0)
HCT: 40.5 % (ref 36.0–46.0)
HEMOGLOBIN: 13.5 g/dL (ref 12.0–15.0)
LYMPHS ABS: 2.1 10*3/uL (ref 0.7–4.0)
LYMPHS PCT: 35.2 % (ref 12.0–46.0)
MCHC: 33.3 g/dL (ref 30.0–36.0)
MCV: 89.1 fl (ref 78.0–100.0)
Monocytes Absolute: 0.5 10*3/uL (ref 0.1–1.0)
Monocytes Relative: 8.7 % (ref 3.0–12.0)
NEUTROS ABS: 3.1 10*3/uL (ref 1.4–7.7)
Neutrophils Relative %: 51.2 % (ref 43.0–77.0)
PLATELETS: 323 10*3/uL (ref 150.0–400.0)
RBC: 4.54 Mil/uL (ref 3.87–5.11)
RDW: 13 % (ref 11.5–15.5)
WBC: 6 10*3/uL (ref 4.0–10.5)

## 2013-12-01 LAB — POCT URINALYSIS DIPSTICK
Bilirubin, UA: NEGATIVE
Blood, UA: NEGATIVE
Glucose, UA: NEGATIVE
KETONES UA: NEGATIVE
Leukocytes, UA: NEGATIVE
Nitrite, UA: NEGATIVE
PH UA: 6
PROTEIN UA: NEGATIVE
Spec Grav, UA: 1.02
Urobilinogen, UA: 0.2

## 2013-12-01 LAB — HEPATIC FUNCTION PANEL
ALK PHOS: 53 U/L (ref 39–117)
ALT: 23 U/L (ref 0–35)
AST: 27 U/L (ref 0–37)
Albumin: 3.5 g/dL (ref 3.5–5.2)
BILIRUBIN TOTAL: 0.8 mg/dL (ref 0.2–1.2)
Bilirubin, Direct: 0.1 mg/dL (ref 0.0–0.3)
Total Protein: 7.3 g/dL (ref 6.0–8.3)

## 2013-12-01 LAB — LIPID PANEL
CHOLESTEROL: 179 mg/dL (ref 0–200)
HDL: 35.7 mg/dL — ABNORMAL LOW (ref >39.00)
LDL Cholesterol: 119 mg/dL — ABNORMAL HIGH (ref 0–99)
NonHDL: 143.3
Total CHOL/HDL Ratio: 5
Triglycerides: 120 mg/dL (ref 0.0–149.0)
VLDL: 24 mg/dL (ref 0.0–40.0)

## 2013-12-01 LAB — BASIC METABOLIC PANEL
BUN: 13 mg/dL (ref 6–23)
CO2: 31 meq/L (ref 19–32)
CREATININE: 0.7 mg/dL (ref 0.4–1.2)
Calcium: 9 mg/dL (ref 8.4–10.5)
Chloride: 102 mEq/L (ref 96–112)
GFR: 98.12 mL/min (ref >60.00)
GLUCOSE: 89 mg/dL (ref 70–99)
Potassium: 3.5 mEq/L (ref 3.5–5.1)
Sodium: 139 mEq/L (ref 135–145)

## 2013-12-01 LAB — TSH: TSH: 0.08 u[IU]/mL — ABNORMAL LOW (ref 0.35–4.50)

## 2013-12-08 ENCOUNTER — Encounter: Payer: Self-pay | Admitting: Family

## 2013-12-08 ENCOUNTER — Ambulatory Visit (INDEPENDENT_AMBULATORY_CARE_PROVIDER_SITE_OTHER): Payer: BC Managed Care – PPO | Admitting: Family

## 2013-12-08 VITALS — BP 110/80 | HR 85 | Ht 71.5 in | Wt 166.0 lb

## 2013-12-08 DIAGNOSIS — E7800 Pure hypercholesterolemia, unspecified: Secondary | ICD-10-CM | POA: Insufficient documentation

## 2013-12-08 DIAGNOSIS — I10 Essential (primary) hypertension: Secondary | ICD-10-CM

## 2013-12-08 DIAGNOSIS — Z23 Encounter for immunization: Secondary | ICD-10-CM

## 2013-12-08 DIAGNOSIS — Z0000 Encounter for general adult medical examination without abnormal findings: Secondary | ICD-10-CM

## 2013-12-08 DIAGNOSIS — E039 Hypothyroidism, unspecified: Secondary | ICD-10-CM

## 2013-12-08 DIAGNOSIS — Z720 Tobacco use: Secondary | ICD-10-CM

## 2013-12-08 HISTORY — DX: Pure hypercholesterolemia, unspecified: E78.00

## 2013-12-08 MED ORDER — LEVOTHYROXINE SODIUM 137 MCG PO TABS: 137.0000 ug | ORAL_TABLET | Freq: Every day | ORAL | Status: DC

## 2013-12-08 NOTE — Progress Notes (Signed)
Subjective:    Patient ID: Theresa Perez, female    DOB: 04/07/1950, 63 y.o.   MRN: [id number].o.   MRN: [id number]  HPI 63 year old white female, nonsmoker, with a history of anxiety, hypothyroidism as a result of a thyroidectomy, and hypertension presents today for complete physical exam.   This is a routine wellness  examination for this patient . I reviewed all health maintenance protocols including mammography, colonoscopy, bone density Needed referrals were placed. Age and diagnosis  appropriate screening labs were ordered. Her immunization history was reviewed and appropriate vaccinations were ordered. Her current medications and allergies were reviewed and needed refills of her chronic medications were ordered. The plan for yearly health maintenance was discussed all orders and referrals were made as appropriate.   Review of Systems  Constitutional: Negative.   HENT: Negative.   Eyes: Negative.   Respiratory: Negative.   Cardiovascular: Negative.   Gastrointestinal: Negative.   Endocrine: Negative.   Genitourinary: Negative.   Musculoskeletal: Negative.   Skin: Negative.   Allergic/Immunologic: Negative.   Neurological: Negative.   Hematological: Negative.   Psychiatric/Behavioral: Negative.    Past Medical History  Diagnosis Date  . Hypertension   . Hypothyroidism   . Allergy   . Anxiety   . Cyst of breast, right, diffuse fibrocystic   . Cancer     Thyroid-Papillary  . Complication of anesthesia   . PONV (postoperative nausea and vomiting)     History   Social History  . Marital Status: Married    Spouse Name: N/A    Number of Children: N/A  . Years of Education: N/A   Occupational History  . Not on file.   Social History Main Topics  . Smoking status: Current Every Day Smoker -- 0.30 packs/day for 15 years    Types: Cigarettes  . Smokeless tobacco: Never Used     Comment: Occassional smoker  . Alcohol Use: Yes     Comment: Rarely  . Drug Use: No  . Sexual Activity:  Yes    Birth Control/ Protection: Post-menopausal   Other Topics Concern  . Not on file   Social History Narrative  . No narrative on file    Past Surgical History  Procedure Laterality Date  . Aspiration cyst right breast    . Uterine ablation    . Thyroidectomy  1997  . Tonsillectomy  1969  . Vaginal hysterectomy N/A 03/31/2012    Procedure: HYSTERECTOMY VAGINAL;  Surgeon: Gus Height, MD;  Location: [facility];  Service: Gynecology;  Laterality: N/A;  . Anterior and posterior repair N/A 03/31/2012    Procedure: ANTERIOR (CYSTOCELE) AND POSTERIOR REPAIR (RECTOCELE);  Surgeon: Gus Height, MD;  Location: [facility];  Service: Gynecology;  Laterality: N/A;    Family History  Problem Relation Age of Onset  . Diabetes Mother   . Deep vein thrombosis Mother     Varicose Veins  . Cancer Father     Father died from brain tumor.  [name] Heart disease Father   . Colon cancer Neg Hx   . Stomach cancer Neg Hx     No Known Allergies  Current Outpatient Prescriptions on File Prior to Visit  Medication Sig Dispense Refill  . acetaminophen (TYLENOL) 500 MG tablet Take 1-2 tablets (500-1,000 mg total) by mouth every 6 (six) hours as needed for moderate pain.  30 tablet  0  . Ascorbic Acid (VITAMIN C PO) Take 1 tablet by mouth daily.      [name] atenolol-chlorthalidone (TENORETIC) 50-25  MG per tablet Take 0.5 tablets by mouth daily.      . Black Cohosh 540 MG CAPS Take 540 mg by mouth 2 (two) times daily.       . butalbital-acetaminophen-caffeine (ESGIC) 50-325-40 MG per tablet Take 1 tablet by mouth 2 (two) times daily as needed for headache.  30 tablet  1  . CALCIUM PO Take 1 tablet by mouth daily.      [name] COENZYME Q-10 PO Take 1 tablet by mouth daily.      . Cyanocobalamin (VITAMIN B-12 PO) Take 1 tablet by mouth daily.      . DULoxetine (CYMBALTA) 60 MG capsule Take 60 mg by mouth 2 (two) times daily.      [name] ECHINACEA PO Take 1 tablet by mouth daily.      [name] EVENING PRIMROSE OIL PO Take 1 tablet by  mouth daily.      . ferrous sulfate 325 (65 FE) MG tablet Take 325 mg by mouth every evening.       . fluticasone (FLONASE) 50 MCG/ACT nasal spray Place 2 sprays into both nostrils daily.      . Ginkgo Biloba (GNP GINGKO BILOBA EXTRACT PO) Take 1 tablet by mouth daily.      [name] ibuprofen (ADVIL,MOTRIN) 200 MG tablet Take 2-3 tablets (400-600 mg total) by mouth every 8 (eight) hours as needed for moderate pain.  21 tablet  0  . levothyroxine (SYNTHROID, LEVOTHROID) 150 MCG tablet Take 150 mcg by mouth daily before breakfast.      . LORazepam (ATIVAN) 0.5 MG tablet Take 0.5 mg by mouth every 8 (eight) hours as needed for anxiety.      . Misc Natural Products (GLUCOSAMINE CHOND COMPLEX/MSM) TABS Take 1 tablet by mouth 2 (two) times daily.      . Multiple Vitamin (ANTIOXIDANT FORMULA PO) Take 1 tablet by mouth daily.      . Multiple Vitamins-Minerals (MULTIVITAMIN WITH MINERALS) tablet Take 1 tablet by mouth daily.      . Nutritional Supplements (DHEA PO) Take 1 tablet by mouth daily.      . Soft Lens Products (SALINE SENSITIVE EYES) SOLN Place 1 drop into both eyes daily.       No current facility-administered medications on file prior to visit.    BP 110/80  Pulse 85  Ht 5' 11.5" (1.816 m)  Wt 166 lb (75.297 kg)  BMI 22.83 kg/m2chart    Objective:   Physical Exam  Constitutional: She is oriented to person, place, and time. She appears well-developed and well-nourished.  HENT:  Head: Normocephalic and atraumatic.  Right Ear: External ear normal.  Left Ear: External ear normal.  Nose: Nose normal.  Mouth/Throat: Oropharynx is clear and moist.  Eyes: Conjunctivae and EOM are normal. Pupils are equal, round, and reactive to light.  Neck: Normal range of motion. Neck supple. No thyromegaly present.  Cardiovascular: Normal rate, regular rhythm and normal heart sounds.   Pulmonary/Chest: Effort normal and breath sounds normal.  Abdominal: Soft. Bowel sounds are normal.  Musculoskeletal:  Normal range of motion.  Neurological: She is alert and oriented to person, place, and time. She has normal reflexes.  Skin: Skin is warm and dry.  Psychiatric: She has a normal mood and affect.          Assessment & Plan:  Jacqueli was seen today for annual exam.  Diagnoses and associated orders for this visit:  Preventative health care  Essential hypertension  Hypothyroidism, unspecified hypothyroidism type  Tobacco abuse  Need for prophylactic vaccination against Streptococcus pneumoniae (pneumococcus) - Pneumococcal conjugate vaccine 13-valent  Encounter for immunization  Pure hypercholesterolemia  Other Orders - levothyroxine (SYNTHROID, LEVOTHROID) 137 MCG tablet; Take 1 tablet (137 mcg total) by mouth daily before breakfast.    Encouraged healthy diet, exercise, monthly self breast exams. Schedule mammogram. Decrease Synthroid. Recheck TSH in 6 weeks.

## 2013-12-08 NOTE — Progress Notes (Signed)
Pre visit review using our clinic review tool, if applicable. No additional management support is needed unless otherwise documented below in the visit note. 

## 2013-12-08 NOTE — Patient Instructions (Signed)
Exercise to Stay Healthy Exercise helps you become and stay healthy. EXERCISE IDEAS AND TIPS Choose exercises that:  You enjoy.  Fit into your day. You do not need to exercise really hard to be healthy. You can do exercises at a slow or medium level and stay healthy. You can:  Stretch before and after working out.  Try yoga, Pilates, or tai chi.  Lift weights.  Walk fast, swim, jog, run, climb stairs, bicycle, dance, or rollerskate.  Take aerobic classes. Exercises that burn about 150 calories:  Running 1  miles in 15 minutes.  Playing volleyball for 45 to 60 minutes.  Washing and waxing a car for 45 to 60 minutes.  Playing touch football for 45 minutes.  Walking 1  miles in 35 minutes.  Pushing a stroller 1  miles in 30 minutes.  Playing basketball for 30 minutes.  Raking leaves for 30 minutes.  Bicycling 5 miles in 30 minutes.  Walking 2 miles in 30 minutes.  Dancing for 30 minutes.  Shoveling snow for 15 minutes.  Swimming laps for 20 minutes.  Walking up stairs for 15 minutes.  Bicycling 4 miles in 15 minutes.  Gardening for 30 to 45 minutes.  Jumping rope for 15 minutes.  Washing windows or floors for 45 to 60 minutes. Document Released: 03/09/2010 Document Revised: 04/29/2011 Document Reviewed: 03/09/2010 Exit[employer] Patient Information 2015 [employer]. This information is not intended to replace advice given to you by your health care provider. Make sure you discuss any questions you have with your health care provider.  

## 2013-12-09 ENCOUNTER — Telehealth: Payer: Self-pay | Admitting: Internal Medicine

## 2013-12-09 ENCOUNTER — Telehealth: Payer: Self-pay | Admitting: Family

## 2013-12-09 MED ORDER — LEVOTHYROXINE SODIUM 137 MCG PO TABS: 137.0000 ug | ORAL_TABLET | Freq: Every day | ORAL | Status: DC

## 2013-12-09 NOTE — Telephone Encounter (Signed)
emmi emailed °

## 2013-12-09 NOTE — Telephone Encounter (Signed)
Done

## 2013-12-09 NOTE — Telephone Encounter (Signed)
Pt called to say that her rx should be 90 day supply levothyroxine (SYNTHROID, LEVOTHROID) 137 MCG tablet

## 2013-12-20 ENCOUNTER — Other Ambulatory Visit: Payer: Self-pay

## 2013-12-20 DIAGNOSIS — Z1231 Encounter for screening mammogram for malignant neoplasm of breast: Secondary | ICD-10-CM

## 2013-12-24 ENCOUNTER — Ambulatory Visit (INDEPENDENT_AMBULATORY_CARE_PROVIDER_SITE_OTHER): Payer: BC Managed Care – PPO | Admitting: Family

## 2013-12-24 ENCOUNTER — Encounter: Payer: Self-pay | Admitting: Family

## 2013-12-24 VITALS — BP 120/80 | HR 98 | Temp 98.3°F | Wt 164.0 lb

## 2013-12-24 DIAGNOSIS — Z720 Tobacco use: Secondary | ICD-10-CM

## 2013-12-24 DIAGNOSIS — R05 Cough: Secondary | ICD-10-CM

## 2013-12-24 DIAGNOSIS — J069 Acute upper respiratory infection, unspecified: Secondary | ICD-10-CM

## 2013-12-24 DIAGNOSIS — R059 Cough, unspecified: Secondary | ICD-10-CM

## 2013-12-24 MED ORDER — DOXYCYCLINE HYCLATE 100 MG PO TABS: 100.0000 mg | ORAL_TABLET | Freq: Two times a day (BID) | ORAL | Status: DC

## 2013-12-24 NOTE — Patient Instructions (Signed)
Upper Respiratory Infection, Adult An upper respiratory infection (URI) is also sometimes known as the common cold. The upper respiratory tract includes the nose, sinuses, throat, trachea, and bronchi. Bronchi are the airways leading to the lungs. Most people improve within 1 week, but symptoms can last up to 2 weeks. A residual cough may last even longer.  CAUSES Many different viruses can infect the tissues lining the upper respiratory tract. The tissues become irritated and inflamed and often become very moist. Mucus production is also common. A cold is contagious. You can easily spread the virus to others by oral contact. This includes kissing, sharing a glass, coughing, or sneezing. Touching your mouth or nose and then touching a surface, which is then touched by another person, can also spread the virus. SYMPTOMS  Symptoms typically develop 1 to 3 days after you come in contact with a cold virus. Symptoms vary from person to person. They may include:  Runny nose.  Sneezing.  Nasal congestion.  Sinus irritation.  Sore throat.  Loss of voice (laryngitis).  Cough.  Fatigue.  Muscle aches.  Loss of appetite.  Headache.  Low-grade fever. DIAGNOSIS  You might diagnose your own cold based on familiar symptoms, since most people get a cold 2 to 3 times a year. Your caregiver can confirm this based on your exam. Most importantly, your caregiver can check that your symptoms are not due to another disease such as strep throat, sinusitis, pneumonia, asthma, or epiglottitis. Blood tests, throat tests, and X-rays are not necessary to diagnose a common cold, but they may sometimes be helpful in excluding other more serious diseases. Your caregiver will decide if any further tests are required. RISKS AND COMPLICATIONS  You may be at risk for a more severe case of the common cold if you smoke cigarettes, have chronic heart disease (such as heart failure) or lung disease (such as asthma), or if  you have a weakened immune system. The very young and very old are also at risk for more serious infections. Bacterial sinusitis, middle ear infections, and bacterial pneumonia can complicate the common cold. The common cold can worsen asthma and chronic obstructive pulmonary disease (COPD). Sometimes, these complications can require emergency medical care and may be life-threatening. PREVENTION  The best way to protect against getting a cold is to practice good hygiene. Avoid oral or hand contact with people with cold symptoms. Wash your hands often if contact occurs. There is no clear evidence that vitamin C, vitamin E, echinacea, or exercise reduces the chance of developing a cold. However, it is always recommended to get plenty of rest and practice good nutrition. TREATMENT  Treatment is directed at relieving symptoms. There is no cure. Antibiotics are not effective, because the infection is caused by a virus, not by bacteria. Treatment may include:  Increased fluid intake. Sports drinks offer valuable electrolytes, sugars, and fluids.  Breathing heated mist or steam (vaporizer or shower).  Eating chicken soup or other clear broths, and maintaining good nutrition.  Getting plenty of rest.  Using gargles or lozenges for comfort.  Controlling fevers with ibuprofen or acetaminophen as directed by your caregiver.  Increasing usage of your inhaler if you have asthma. Zinc gel and zinc lozenges, taken in the first 24 hours of the common cold, can shorten the duration and lessen the severity of symptoms. Pain medicines may help with fever, muscle aches, and throat pain. A variety of non-prescription medicines are available to treat congestion and runny nose. Your caregiver   can make recommendations and may suggest nasal or lung inhalers for other symptoms.  HOME CARE INSTRUCTIONS   Only take over-the-counter or prescription medicines for pain, discomfort, or fever as directed by your  caregiver.  Use a warm mist humidifier or inhale steam from a shower to increase air moisture. This may keep secretions moist and make it easier to breathe.  Drink enough water and fluids to keep your urine clear or pale yellow.  Rest as needed.  Return to work when your temperature has returned to normal or as your caregiver advises. You may need to stay home longer to avoid infecting others. You can also use a face mask and careful hand washing to prevent spread of the virus. SEEK MEDICAL CARE IF:   After the first few days, you feel you are getting worse rather than better.  You need your caregiver's advice about medicines to control symptoms.  You develop chills, worsening shortness of breath, or brown or red sputum. These may be signs of pneumonia.  You develop yellow or brown nasal discharge or pain in the face, especially when you bend forward. These may be signs of sinusitis.  You develop a fever, swollen neck glands, pain with swallowing, or white areas in the back of your throat. These may be signs of strep throat. SEEK IMMEDIATE MEDICAL CARE IF:   You have a fever.  You develop severe or persistent headache, ear pain, sinus pain, or chest pain.  You develop wheezing, a prolonged cough, cough up blood, or have a change in your usual mucus (if you have chronic lung disease).  You develop sore muscles or a stiff neck. Document Released: 07/31/2000 Document Revised: 04/29/2011 Document Reviewed: 05/12/2013 Exit[employer] Patient Information 2015 [employer]. This information is not intended to replace advice given to you by your health care provider. Make sure you discuss any questions you have with your health care provider.  

## 2013-12-24 NOTE — Progress Notes (Signed)
Subjective:    Patient ID: Theresa Perez, female    DOB: [birth year], 63 y.o.   MRN: [id number]  HPI  63 year old white female, one half pack per day smoker is in today with complaints of cough, congestion 1 week. Has been using cough drops and Tylenol Cold and flu without much relief. Symptoms are worse at night when she lies down. Reports exposure from her great granddaughter who is also ill. Requesting antibiotics and she is going out of town this weekend. Has a history of pneumonia  Review of Systems  Constitutional: Negative.   HENT: Positive for congestion and postnasal drip. Negative for sinus pressure, sneezing and sore throat.   Respiratory: Positive for cough. Negative for shortness of breath and wheezing.   Cardiovascular: Negative.   Gastrointestinal: Negative.   Endocrine: Negative.   Genitourinary: Negative.   Musculoskeletal: Negative.   Skin: Negative.   Allergic/Immunologic: Negative.   Neurological: Negative.   Psychiatric/Behavioral: Negative.    Past Medical History  Diagnosis Date  . Hypertension   . Hypothyroidism   . Allergy   . Anxiety   . Cyst of breast, right, diffuse fibrocystic   . Cancer     Thyroid-Papillary  . Complication of anesthesia   . PONV (postoperative nausea and vomiting)     History   Social History  . Marital Status: Married    Spouse Name: N/A    Number of Children: N/A  . Years of Education: N/A   Occupational History  . Not on file.   Social History Main Topics  . Smoking status: Current Every Day Smoker -- 0.30 packs/day for 15 years    Types: Cigarettes  . Smokeless tobacco: Never Used     Comment: Occassional smoker  . Alcohol Use: Yes     Comment: Rarely  . Drug Use: No  . Sexual Activity: Yes    Birth Control/ Protection: Post-menopausal   Other Topics Concern  . Not on file   Social History Narrative    Past Surgical History  Procedure Laterality Date  . Aspiration cyst right breast    . Uterine  ablation    . Thyroidectomy  1997  . Tonsillectomy  1969  . Vaginal hysterectomy N/A 03/31/2012    Procedure: HYSTERECTOMY VAGINAL;  Surgeon: Gus Height, MD;  Location: [facility];  Service: Gynecology;  Laterality: N/A;  . Anterior and posterior repair N/A 03/31/2012    Procedure: ANTERIOR (CYSTOCELE) AND POSTERIOR REPAIR (RECTOCELE);  Surgeon: Gus Height, MD;  Location: [facility];  Service: Gynecology;  Laterality: N/A;    Family History  Problem Relation Age of Onset  . Diabetes Mother   . Deep vein thrombosis Mother     Varicose Veins  . Cancer Father     Father died from brain tumor.  [name] Heart disease Father   . Colon cancer Neg Hx   . Stomach cancer Neg Hx     No Known Allergies  Current Outpatient Prescriptions on File Prior to Visit  Medication Sig Dispense Refill  . acetaminophen (TYLENOL) 500 MG tablet Take 1-2 tablets (500-1,000 mg total) by mouth every 6 (six) hours as needed for moderate pain. 30 tablet 0  . Ascorbic Acid (VITAMIN C PO) Take 1 tablet by mouth daily.    [name] atenolol-chlorthalidone (TENORETIC) 50-25 MG per tablet Take 0.5 tablets by mouth daily.    . Black Cohosh 540 MG CAPS Take 540 mg by mouth 2 (two) times daily.     . butalbital-acetaminophen-caffeine (ESGIC)  50-325-40 MG per tablet Take 1 tablet by mouth 2 (two) times daily as needed for headache. 30 tablet 1  . CALCIUM PO Take 1 tablet by mouth daily.    [name] COENZYME Q-10 PO Take 1 tablet by mouth daily.    . Cyanocobalamin (VITAMIN B-12 PO) Take 1 tablet by mouth daily.    . DULoxetine (CYMBALTA) 60 MG capsule Take 60 mg by mouth 2 (two) times daily.    [name] ECHINACEA PO Take 1 tablet by mouth daily.    [name] EVENING PRIMROSE OIL PO Take 1 tablet by mouth daily.    . ferrous sulfate 325 (65 FE) MG tablet Take 325 mg by mouth every evening.     . fluticasone (FLONASE) 50 MCG/ACT nasal spray Place 2 sprays into both nostrils daily.    . Ginkgo Biloba (GNP GINGKO BILOBA EXTRACT PO) Take 1 tablet by mouth daily.    [name]  ibuprofen (ADVIL,MOTRIN) 200 MG tablet Take 2-3 tablets (400-600 mg total) by mouth every 8 (eight) hours as needed for moderate pain. 21 tablet 0  . levothyroxine (SYNTHROID, LEVOTHROID) 137 MCG tablet Take 1 tablet (137 mcg total) by mouth daily before breakfast. 90 tablet 1  . LORazepam (ATIVAN) 0.5 MG tablet Take 0.5 mg by mouth every 8 (eight) hours as needed for anxiety.    . Misc Natural Products (GLUCOSAMINE CHOND COMPLEX/MSM) TABS Take 1 tablet by mouth 2 (two) times daily.    . Multiple Vitamin (ANTIOXIDANT FORMULA PO) Take 1 tablet by mouth daily.    . Multiple Vitamins-Minerals (MULTIVITAMIN WITH MINERALS) tablet Take 1 tablet by mouth daily.    . Nutritional Supplements (DHEA PO) Take 1 tablet by mouth daily.    . Soft Lens Products (SALINE SENSITIVE EYES) SOLN Place 1 drop into both eyes daily.     No current facility-administered medications on file prior to visit.    BP 120/80 mmHg  Pulse 98  Temp(Src) 98.3 F (36.8 C) (Oral)  Wt 164 lb (74.39 kg)chart    Objective:   Physical Exam  Constitutional: She is oriented to person, place, and time. She appears well-developed and well-nourished.  HENT:  Right Ear: External ear normal.  Left Ear: External ear normal.  Nose: Nose normal.  Mouth/Throat: Oropharynx is clear and moist.  Neck: Normal range of motion. Neck supple.  Cardiovascular: Normal rate, regular rhythm and normal heart sounds.   Pulmonary/Chest: Effort normal and breath sounds normal. She has no wheezes. She has no rales.  Musculoskeletal: Normal range of motion.  Neurological: She is alert and oriented to person, place, and time.  Skin: Skin is warm and dry.  Psychiatric: She has a normal mood and affect.          Assessment & Plan:

## 2013-12-24 NOTE — Progress Notes (Signed)
Pre visit review using our clinic review tool, if applicable. No additional management support is needed unless otherwise documented below in the visit note. 

## 2014-01-10 ENCOUNTER — Encounter: Payer: Self-pay | Admitting: Family

## 2014-01-10 ENCOUNTER — Other Ambulatory Visit: Payer: Self-pay | Admitting: Family

## 2014-01-10 MED ORDER — METHYLPREDNISOLONE 4 MG PO KIT: PACK | ORAL | Status: DC

## 2014-01-11 MED ORDER — METHYLPREDNISOLONE 4 MG PO KIT: PACK | ORAL | Status: AC

## 2014-01-11 NOTE — Telephone Encounter (Signed)
Pt would like this rx methylPREDNISolone (MEDROL DOSEPAK) 4 MG tablet sent to [facility].  Can you resen to this pharm

## 2014-01-11 NOTE — Telephone Encounter (Signed)
Resent

## 2014-01-12 ENCOUNTER — Ambulatory Visit
Admission: RE | Admit: 2014-01-12 | Discharge: 2014-01-12 | Disposition: A | Discharge status: Home / Self Care | Payer: BC Managed Care – PPO | Source: Ambulatory Visit

## 2014-01-12 ENCOUNTER — Other Ambulatory Visit: Payer: Self-pay | Admitting: Dermatology

## 2014-01-12 DIAGNOSIS — Z1231 Encounter for screening mammogram for malignant neoplasm of breast: Secondary | ICD-10-CM

## 2014-02-01 ENCOUNTER — Encounter: Payer: Self-pay | Admitting: Family

## 2014-02-01 ENCOUNTER — Ambulatory Visit (INDEPENDENT_AMBULATORY_CARE_PROVIDER_SITE_OTHER): Payer: BC Managed Care – PPO | Admitting: Family

## 2014-02-01 VITALS — BP 124/84 | HR 89 | Temp 98.1°F | Wt 160.6 lb

## 2014-02-01 DIAGNOSIS — J449 Chronic obstructive pulmonary disease, unspecified: Secondary | ICD-10-CM

## 2014-02-01 DIAGNOSIS — IMO0001 Reserved for inherently not codable concepts without codable children: Secondary | ICD-10-CM | POA: Insufficient documentation

## 2014-02-01 DIAGNOSIS — R059 Cough, unspecified: Secondary | ICD-10-CM

## 2014-02-01 DIAGNOSIS — Z720 Tobacco use: Secondary | ICD-10-CM

## 2014-02-01 DIAGNOSIS — R05 Cough: Secondary | ICD-10-CM

## 2014-02-01 MED ORDER — ALBUTEROL SULFATE 108 (90 BASE) MCG/ACT IN AEPB: 1.0000 | INHALATION_SPRAY | Freq: Four times a day (QID) | RESPIRATORY_TRACT | Status: DC | PRN

## 2014-02-01 MED ORDER — BUDESONIDE-FORMOTEROL FUMARATE 160-4.5 MCG/ACT IN AERO: 2.0000 | INHALATION_SPRAY | Freq: Two times a day (BID) | RESPIRATORY_TRACT | Status: DC

## 2014-02-01 NOTE — Progress Notes (Signed)
Pre visit review using our clinic review tool, if applicable. No additional management support is needed unless otherwise documented below in the visit note. 

## 2014-02-01 NOTE — Patient Instructions (Addendum)
1. Zyrtec or Allegra once daily for post nasal drip 2. Stop smoking! This produces further damage to your lungs.   Chronic Obstructive Pulmonary Disease Chronic obstructive pulmonary disease (COPD) is a common lung condition in which airflow from the lungs is limited. COPD is a general term that can be used to describe many different lung problems that limit airflow, including both chronic bronchitis and emphysema. If you have COPD, your lung function will probably never return to normal, but there are measures you can take to improve lung function and make yourself feel better.  CAUSES   Smoking (common).   Exposure to secondhand smoke.   Genetic problems.  Chronic inflammatory lung diseases or recurrent infections. SYMPTOMS   Shortness of breath, especially with physical activity.   Deep, persistent (chronic) cough with a large amount of thick mucus.   Wheezing.   Rapid breaths (tachypnea).   Gray or bluish discoloration (cyanosis) of the skin, especially in fingers, toes, or lips.   Fatigue.   Weight loss.   Frequent infections or episodes when breathing symptoms become much worse (exacerbations).   Chest tightness. DIAGNOSIS  Your health care provider will take a medical history and perform a physical examination to make the initial diagnosis. Additional tests for COPD may include:   Lung (pulmonary) function tests.  Chest X-ray.  CT scan.  Blood tests. TREATMENT  Treatment available to help you feel better when you have COPD includes:   Inhaler and nebulizer medicines. These help manage the symptoms of COPD and make your breathing more comfortable.  Supplemental oxygen. Supplemental oxygen is only helpful if you have a low oxygen level in your blood.   Exercise and physical activity. These are beneficial for nearly all people with COPD. Some people may also benefit from a pulmonary rehabilitation program. HOME CARE INSTRUCTIONS   Take all  medicines (inhaled or pills) as directed by your health care provider.  Avoid over-the-counter medicines or cough syrups that dry up your airway (such as antihistamines) and slow down the elimination of secretions unless instructed otherwise by your health care provider.   If you are a smoker, the most important thing that you can do is stop smoking. Continuing to smoke will cause further lung damage and breathing trouble. Ask your health care provider for help with quitting smoking. He or she can direct you to community resources or hospitals that provide support.  Avoid exposure to irritants such as smoke, chemicals, and fumes that aggravate your breathing.  Use oxygen therapy and pulmonary rehabilitation if directed by your health care provider. If you require home oxygen therapy, ask your health care provider whether you should purchase a pulse oximeter to measure your oxygen level at home.   Avoid contact with individuals who have a contagious illness.  Avoid extreme temperature and humidity changes.  Eat healthy foods. Eating smaller, more frequent meals and resting before meals may help you maintain your strength.  Stay active, but balance activity with periods of rest. Exercise and physical activity will help you maintain your ability to do things you want to do.  Preventing infection and hospitalization is very important when you have COPD. Make sure to receive all the vaccines your health care provider recommends, especially the pneumococcal and influenza vaccines. Ask your health care provider whether you need a pneumonia vaccine.  Learn and use relaxation techniques to manage stress.  Learn and use controlled breathing techniques as directed by your health care provider. Controlled breathing techniques include:  Pursed lip breathing. Start by breathing in (inhaling) through your nose for 1 second. Then, purse your lips as if you were going to whistle and breathe out (exhale)  through the pursed lips for 2 seconds.   Diaphragmatic breathing. Start by putting one hand on your abdomen just above your waist. Inhale slowly through your nose. The hand on your abdomen should move out. Then purse your lips and exhale slowly. You should be able to feel the hand on your abdomen moving in as you exhale.   Learn and use controlled coughing to clear mucus from your lungs. Controlled coughing is a series of short, progressive coughs. The steps of controlled coughing are:  1. Lean your head slightly forward.  2. Breathe in deeply using diaphragmatic breathing.  3. Try to hold your breath for 3 seconds.  4. Keep your mouth slightly open while coughing twice.  5. Spit any mucus out into a tissue.  6. Rest and repeat the steps once or twice as needed. SEEK MEDICAL CARE IF:   You are coughing up more mucus than usual.   There is a change in the color or thickness of your mucus.   Your breathing is more labored than usual.   Your breathing is faster than usual.  SEEK IMMEDIATE MEDICAL CARE IF:   You have shortness of breath while you are resting.   You have shortness of breath that prevents you from:  Being able to talk.   Performing your usual physical activities.   You have chest pain lasting longer than 5 minutes.   Your skin color is more cyanotic than usual.  You measure low oxygen saturations for longer than 5 minutes with a pulse oximeter. MAKE SURE YOU:   Understand these instructions.  Will watch your condition.  Will get help right away if you are not doing well or get worse. Document Released: 11/14/2004 Document Revised: 06/21/2013 Document Reviewed: 10/01/2012 [facility] Patient Information 2015 [place], [place]. This information is not intended to replace advice given to you by your health care provider. Make sure you discuss any questions you have with your health care provider.

## 2014-02-01 NOTE — Progress Notes (Signed)
Subjective:    Patient ID: Theresa Perez, female    DOB: 09-15-1950, 63 y.o.   MRN: [id number]  HPI 63 year old white female, 20 year smoker is in today with c/o cough, chest congestion that continues to reoccur. She was seen 9/30 with pneumonia and CXR showed COPD. She reports not being aware of a diagnosis of COPD. She continues to smoke. Responds well to prednisone but after the medication is complete she begins coughing again particularly at night.   Review of Systems  Constitutional: Negative.   HENT: Negative.   Respiratory: Positive for cough and shortness of breath. Negative for wheezing.   Cardiovascular: Negative.   Gastrointestinal: Negative.   Endocrine: Negative.   Genitourinary: Negative.   Musculoskeletal: Negative.   Skin: Negative.   Allergic/Immunologic: Negative.   Psychiatric/Behavioral: Negative.    Past Medical History  Diagnosis Date  . Hypertension   . Hypothyroidism   . Allergy   . Anxiety   . Cyst of breast, right, diffuse fibrocystic   . Cancer     Thyroid-Papillary  . Complication of anesthesia   . PONV (postoperative nausea and vomiting)     History   Social History  . Marital Status: Married    Spouse Name: N/A    Number of Children: N/A  . Years of Education: N/A   Occupational History  . Not on file.   Social History Main Topics  . Smoking status: Current Every Day Smoker -- 0.30 packs/day for 15 years    Types: Cigarettes  . Smokeless tobacco: Never Used     Comment: Occassional smoker  . Alcohol Use: Yes     Comment: Rarely  . Drug Use: No  . Sexual Activity: Yes    Birth Control/ Protection: Post-menopausal   Other Topics Concern  . Not on file   Social History Narrative    Past Surgical History  Procedure Laterality Date  . Aspiration cyst right breast    . Uterine ablation    . Thyroidectomy  1997  . Tonsillectomy  1969  . Vaginal hysterectomy N/A 03/31/2012    Procedure: HYSTERECTOMY VAGINAL;  Surgeon: Gus Height, MD;  Location: [facility];  Service: Gynecology;  Laterality: N/A;  . Anterior and posterior repair N/A 03/31/2012    Procedure: ANTERIOR (CYSTOCELE) AND POSTERIOR REPAIR (RECTOCELE);  Surgeon: Gus Height, MD;  Location: [facility];  Service: Gynecology;  Laterality: N/A;    Family History  Problem Relation Age of Onset  . Diabetes Mother   . Deep vein thrombosis Mother     Varicose Veins  . Cancer Father     Father died from brain tumor.  [name] Heart disease Father   . Colon cancer Neg Hx   . Stomach cancer Neg Hx     No Known Allergies  Current Outpatient Prescriptions on File Prior to Visit  Medication Sig Dispense Refill  . acetaminophen (TYLENOL) 500 MG tablet Take 1-2 tablets (500-1,000 mg total) by mouth every 6 (six) hours as needed for moderate pain. 30 tablet 0  . Ascorbic Acid (VITAMIN C PO) Take 1 tablet by mouth daily.    [name] atenolol-chlorthalidone (TENORETIC) 50-25 MG per tablet Take 0.5 tablets by mouth daily.    . Black Cohosh 540 MG CAPS Take 540 mg by mouth 2 (two) times daily.     . butalbital-acetaminophen-caffeine (ESGIC) 50-325-40 MG per tablet Take 1 tablet by mouth 2 (two) times daily as needed for headache. 30 tablet 1  . CALCIUM  PO Take 1 tablet by mouth daily.    [name] COENZYME Q-10 PO Take 1 tablet by mouth daily.    . Cyanocobalamin (VITAMIN B-12 PO) Take 1 tablet by mouth daily.    [name] doxycycline (VIBRA-TABS) 100 MG tablet Take 1 tablet (100 mg total) by mouth 2 (two) times daily. 20 tablet 0  . DULoxetine (CYMBALTA) 60 MG capsule Take 60 mg by mouth 2 (two) times daily.    [name] ECHINACEA PO Take 1 tablet by mouth daily.    [name] EVENING PRIMROSE OIL PO Take 1 tablet by mouth daily.    . ferrous sulfate 325 (65 FE) MG tablet Take 325 mg by mouth every evening.     . fluticasone (FLONASE) 50 MCG/ACT nasal spray Place 2 sprays into both nostrils daily.    . Ginkgo Biloba (GNP GINGKO BILOBA EXTRACT PO) Take 1 tablet by mouth daily.    [name] ibuprofen (ADVIL,MOTRIN) 200 MG  tablet Take 2-3 tablets (400-600 mg total) by mouth every 8 (eight) hours as needed for moderate pain. 21 tablet 0  . levothyroxine (SYNTHROID, LEVOTHROID) 137 MCG tablet Take 1 tablet (137 mcg total) by mouth daily before breakfast. 90 tablet 1  . LORazepam (ATIVAN) 0.5 MG tablet Take 0.5 mg by mouth every 8 (eight) hours as needed for anxiety.    . Misc Natural Products (GLUCOSAMINE CHOND COMPLEX/MSM) TABS Take 1 tablet by mouth 2 (two) times daily.    . Multiple Vitamin (ANTIOXIDANT FORMULA PO) Take 1 tablet by mouth daily.    . Multiple Vitamins-Minerals (MULTIVITAMIN WITH MINERALS) tablet Take 1 tablet by mouth daily.    . Nutritional Supplements (DHEA PO) Take 1 tablet by mouth daily.    . Soft Lens Products (SALINE SENSITIVE EYES) SOLN Place 1 drop into both eyes daily.     No current facility-administered medications on file prior to visit.    BP 124/84 mmHg  Pulse 89  Temp(Src) 98.1 F (36.7 C) (Oral)  Wt 160 lb 9.6 oz (72.848 kg)  SpO2 92%chart    Objective:   Physical Exam  Constitutional: She is oriented to person, place, and time. She appears well-developed and well-nourished.  HENT:  Right Ear: External ear normal.  Left Ear: External ear normal.  Nose: Nose normal.  Mouth/Throat: Oropharynx is clear and moist.  Neck: Normal range of motion. Neck supple.  Cardiovascular: Normal rate, regular rhythm and normal heart sounds.   Pulmonary/Chest: Breath sounds normal. She has no wheezes.  Musculoskeletal: Normal range of motion.  Neurological: She is alert and oriented to person, place, and time.  Skin: Skin is warm and dry.  Psychiatric: She has a normal mood and affect.          Assessment & Plan:  Charliee was seen today for no specified reason.  Diagnoses and associated orders for this visit:  COPD bronchitis  Tobacco abuse  Other Orders - budesonide-formoterol (SYMBICORT) 160-4.5 MCG/ACT inhaler; Inhale 2 puffs into the lungs 2 (two) times  daily. - Albuterol Sulfate (PROAIR RESPICLICK) 779 (90 BASE) MCG/ACT AEPB; Inhale 1-2 puffs into the lungs 4 (four) times daily as needed.    Strongly encouraged smoking cessation. Start Symbicort 2 puffs twice daily. Albuterol inhaler prescribed as a rescue as needed. Recheck in 3 weeks.

## 2014-02-16 ENCOUNTER — Other Ambulatory Visit: Payer: Self-pay | Admitting: Dermatology

## 2014-02-22 ENCOUNTER — Encounter: Payer: Self-pay | Admitting: Family

## 2014-02-22 ENCOUNTER — Other Ambulatory Visit: Payer: Self-pay

## 2014-02-22 MED ORDER — LORAZEPAM 0.5 MG PO TABS: 0.5000 mg | ORAL_TABLET | Freq: Three times a day (TID) | ORAL | Status: DC | PRN

## 2014-02-25 ENCOUNTER — Ambulatory Visit: Payer: BC Managed Care – PPO | Admitting: Family

## 2014-03-04 ENCOUNTER — Ambulatory Visit (INDEPENDENT_AMBULATORY_CARE_PROVIDER_SITE_OTHER): Payer: BC Managed Care – PPO | Admitting: Family

## 2014-03-04 ENCOUNTER — Encounter: Payer: Self-pay | Admitting: Family

## 2014-03-04 VITALS — BP 100/62 | HR 89 | Temp 98.1°F | Wt 162.4 lb

## 2014-03-04 DIAGNOSIS — IMO0001 Reserved for inherently not codable concepts without codable children: Secondary | ICD-10-CM

## 2014-03-04 DIAGNOSIS — J449 Chronic obstructive pulmonary disease, unspecified: Secondary | ICD-10-CM

## 2014-03-04 DIAGNOSIS — Z720 Tobacco use: Secondary | ICD-10-CM

## 2014-03-04 DIAGNOSIS — E039 Hypothyroidism, unspecified: Secondary | ICD-10-CM

## 2014-03-04 DIAGNOSIS — F17200 Nicotine dependence, unspecified, uncomplicated: Secondary | ICD-10-CM

## 2014-03-04 LAB — TSH: TSH: 0.07 u[IU]/mL — ABNORMAL LOW (ref 0.35–4.50)

## 2014-03-04 MED ORDER — LEVOTHYROXINE SODIUM 137 MCG PO TABS: 137.0000 ug | ORAL_TABLET | Freq: Every day | ORAL | Status: DC

## 2014-03-04 NOTE — Progress Notes (Signed)
Pre visit review using our clinic review tool, if applicable. No additional management support is needed unless otherwise documented below in the visit note. 

## 2014-03-04 NOTE — Progress Notes (Signed)
Subjective:    Patient ID: Theresa Perez, female    DOB: 04-12-1950, 64 y.o.   MRN: [id number]  HPI 64 year old white female, one half a pack per day smoker, is in today for recheck of COPD. She presented initially with a cough that would not subside. At her last office visit we started Symbicort 160/4.5. The cough has now resolved she's doing much better. She continues to smoke. Also has a history of hypothyroidism and last TSH was abnormal. She is due to have her TSH checked today.   Review of Systems  Constitutional: Negative.   HENT: Negative.   Respiratory: Negative.  Negative for choking, shortness of breath and wheezing.   Cardiovascular: Negative.   Gastrointestinal: Negative.   Endocrine: Negative.   Genitourinary: Negative.   Musculoskeletal: Negative.   Skin: Negative.   Allergic/Immunologic: Negative.   Neurological: Negative.   Psychiatric/Behavioral: Negative.    Past Medical History  Diagnosis Date  . Hypertension   . Hypothyroidism   . Allergy   . Anxiety   . Cyst of breast, right, diffuse fibrocystic   . Cancer     Thyroid-Papillary  . Complication of anesthesia   . PONV (postoperative nausea and vomiting)     History   Social History  . Marital Status: Married    Spouse Name: N/A    Number of Children: N/A  . Years of Education: N/A   Occupational History  . Not on file.   Social History Main Topics  . Smoking status: Current Every Day Smoker -- 0.30 packs/day for 15 years    Types: Cigarettes  . Smokeless tobacco: Never Used     Comment: Occassional smoker  . Alcohol Use: Yes     Comment: Rarely  . Drug Use: No  . Sexual Activity: Yes    Birth Control/ Protection: Post-menopausal   Other Topics Concern  . Not on file   Social History Narrative    Past Surgical History  Procedure Laterality Date  . Aspiration cyst right breast    . Uterine ablation    . Thyroidectomy  1997  . Tonsillectomy  1969  . Vaginal hysterectomy N/A  03/31/2012    Procedure: HYSTERECTOMY VAGINAL;  Surgeon: Gus Height, MD;  Location: [facility];  Service: Gynecology;  Laterality: N/A;  . Anterior and posterior repair N/A 03/31/2012    Procedure: ANTERIOR (CYSTOCELE) AND POSTERIOR REPAIR (RECTOCELE);  Surgeon: Gus Height, MD;  Location: [facility];  Service: Gynecology;  Laterality: N/A;    Family History  Problem Relation Age of Onset  . Diabetes Mother   . Deep vein thrombosis Mother     Varicose Veins  . Cancer Father     Father died from brain tumor.  [name] Heart disease Father   . Colon cancer Neg Hx   . Stomach cancer Neg Hx     No Known Allergies  Current Outpatient Prescriptions on File Prior to Visit  Medication Sig Dispense Refill  . Albuterol Sulfate (PROAIR RESPICLICK) 001 (90 BASE) MCG/ACT AEPB Inhale 1-2 puffs into the lungs 4 (four) times daily as needed. 1 each 1  . Ascorbic Acid (VITAMIN C PO) Take 1 tablet by mouth daily.    [name] atenolol-chlorthalidone (TENORETIC) 50-25 MG per tablet Take 0.5 tablets by mouth daily.    . Black Cohosh 540 MG CAPS Take 540 mg by mouth 2 (two) times daily.     . budesonide-formoterol (SYMBICORT) 160-4.5 MCG/ACT inhaler Inhale 2 puffs into the lungs 2 (two) times  daily. 1 Inhaler 3  . butalbital-acetaminophen-caffeine (ESGIC) 50-325-40 MG per tablet Take 1 tablet by mouth 2 (two) times daily as needed for headache. 30 tablet 1  . CALCIUM PO Take 1 tablet by mouth daily.    [name] COENZYME Q-10 PO Take 1 tablet by mouth daily.    . Cyanocobalamin (VITAMIN B-12 PO) Take 1 tablet by mouth daily.    . DULoxetine (CYMBALTA) 60 MG capsule Take 60 mg by mouth 2 (two) times daily.    [name] ECHINACEA PO Take 1 tablet by mouth daily.    [name] EVENING PRIMROSE OIL PO Take 1 tablet by mouth daily.    . ferrous sulfate 325 (65 FE) MG tablet Take 325 mg by mouth every evening.     . fluticasone (FLONASE) 50 MCG/ACT nasal spray Place 2 sprays into both nostrils daily.    . Ginkgo Biloba (GNP GINGKO BILOBA EXTRACT PO) Take 1  tablet by mouth daily.    [name] LORazepam (ATIVAN) 0.5 MG tablet Take 1 tablet (0.5 mg total) by mouth every 8 (eight) hours as needed for anxiety. 30 tablet 0  . Misc Natural Products (GLUCOSAMINE CHOND COMPLEX/MSM) TABS Take 1 tablet by mouth 2 (two) times daily.    . Multiple Vitamin (ANTIOXIDANT FORMULA PO) Take 1 tablet by mouth daily.    . Multiple Vitamins-Minerals (MULTIVITAMIN WITH MINERALS) tablet Take 1 tablet by mouth daily.    . Nutritional Supplements (DHEA PO) Take 1 tablet by mouth daily.    . Soft Lens Products (SALINE SENSITIVE EYES) SOLN Place 1 drop into both eyes daily.     No current facility-administered medications on file prior to visit.    BP 100/62 mmHg  Pulse 89  Temp(Src) 98.1 F (36.7 C) (Oral)  Wt 162 lb 6.4 oz (73.664 kg)chart    Objective:   Physical Exam  Constitutional: She is oriented to person, place, and time. She appears well-developed and well-nourished.  HENT:  Right Ear: External ear normal.  Left Ear: External ear normal.  Nose: Nose normal.  Mouth/Throat: Oropharynx is clear and moist.  Neck: Normal range of motion. Neck supple. No thyromegaly present.  Cardiovascular: Normal rate, regular rhythm and normal heart sounds.   Pulmonary/Chest: Effort normal and breath sounds normal. She has no wheezes.  Abdominal: Soft. Bowel sounds are normal.  Musculoskeletal: Normal range of motion.  Neurological: She is alert and oriented to person, place, and time.  Skin: Skin is warm and dry.  Psychiatric: She has a normal mood and affect.          Assessment & Plan:  Theresa Perez was seen today for follow-up.  Diagnoses and associated orders for this visit:  COPD bronchitis  Tobacco use disorder  Hypothyroidism, unspecified hypothyroidism type - TSH  Other Orders - levothyroxine (SYNTHROID, LEVOTHROID) 137 MCG tablet; Take 1 tablet (137 mcg total) by mouth daily before breakfast.    Strongly encourage smoking cessation. Continue Symbicort.  Labs obtained today will notify patient pending results. Recheck pending labs in 4 months and sooner as needed.

## 2014-03-04 NOTE — Patient Instructions (Signed)
Chronic Obstructive Pulmonary Disease Chronic obstructive pulmonary disease (COPD) is a common lung problem. In COPD, the flow of air from the lungs is limited. The way your lungs work will probably never return to normal, but there are things you can do to improve your lungs and make yourself feel better. HOME CARE  Take all medicines as told by your doctor.  Avoid medicines or cough syrups that dry up your airway (such as antihistamines) and do not allow you to get rid of thick spit. You do not need to avoid them if told differently by your doctor.  If you smoke, stop. Smoking makes the problem worse.  Avoid being around things that make your breathing worse (like smoke, chemicals, and fumes).  Use oxygen therapy and therapy to help improve your lungs (pulmonary rehabilitation) if told by your doctor. If you need home oxygen therapy, ask your doctor if you should buy a tool to measure your oxygen level (oximeter).  Avoid people who have a sickness you can catch (contagious).  Avoid going outside when it is very hot, cold, or humid.  Eat healthy foods. Eat smaller meals more often. Rest before meals.  Stay active, but remember to also rest.  Make sure to get all the shots (vaccines) your doctor recommends. Ask your doctor if you need a pneumonia shot.  Learn and use tips on how to relax.  Learn and use tips on how to control your breathing as told by your doctor. Try:  Breathing in (inhaling) through your nose for 1 second. Then, pucker your lips and breath out (exhale) through your lips for 2 seconds.  Putting one hand on your belly (abdomen). Breathe in slowly through your nose for 1 second. Your hand on your belly should move out. Pucker your lips and breathe out slowly through your lips. Your hand on your belly should move in as you breathe out.  Learn and use controlled coughing to clear thick spit from your lungs. The steps are: 1. Lean your head a little forward. 2. Breathe  in deeply. 3. Try to hold your breath for 3 seconds. 4. Keep your mouth slightly open while coughing 2 times. 5. Spit any thick spit out into a tissue. 6. Rest and do the steps again 1 or 2 times as needed. GET HELP IF:  You cough up more thick spit than usual.  There is a change in the color or thickness of the spit.  It is harder to breathe than usual.  Your breathing is faster than usual. GET HELP RIGHT AWAY IF:   You have shortness of breath while resting.  You have shortness of breath that stops you from:  Being able to talk.  Doing normal activities.  You chest hurts for longer than 5 minutes.  Your skin color is more blue than usual.  Your pulse oximeter shows that you have low oxygen for longer than 5 minutes. MAKE SURE YOU:   Understand these instructions.  Will watch your condition.  Will get help right away if you are not doing well or get worse. Document Released: 07/24/2007 Document Revised: 06/21/2013 Document Reviewed: 10/01/2012 [employer] Patient Information 2015 [employer]. This information is not intended to replace advice given to you by your health care provider. Make sure you discuss any questions you have with your health care provider.  

## 2014-03-07 ENCOUNTER — Other Ambulatory Visit: Payer: Self-pay | Admitting: Family

## 2014-03-07 ENCOUNTER — Other Ambulatory Visit: Payer: Self-pay

## 2014-03-07 DIAGNOSIS — E039 Hypothyroidism, unspecified: Secondary | ICD-10-CM

## 2014-03-07 MED ORDER — LEVOTHYROXINE SODIUM 125 MCG PO TABS: 125.0000 ug | ORAL_TABLET | Freq: Every day | ORAL | Status: DC

## 2014-03-23 ENCOUNTER — Ambulatory Visit (INDEPENDENT_AMBULATORY_CARE_PROVIDER_SITE_OTHER): Payer: BC Managed Care – PPO | Admitting: Internal Medicine

## 2014-03-23 ENCOUNTER — Encounter: Payer: Self-pay | Admitting: Internal Medicine

## 2014-03-23 VITALS — BP 102/62 | HR 81 | Temp 98.0°F | Resp 12 | Ht 70.5 in | Wt 165.4 lb

## 2014-03-23 DIAGNOSIS — C73 Malignant neoplasm of thyroid gland: Secondary | ICD-10-CM

## 2014-03-23 DIAGNOSIS — E039 Hypothyroidism, unspecified: Secondary | ICD-10-CM

## 2014-03-23 MED ORDER — LEVOTHYROXINE SODIUM 125 MCG PO TABS: 125.0000 ug | ORAL_TABLET | Freq: Every day | ORAL | Status: DC

## 2014-03-23 NOTE — Progress Notes (Signed)
Patient ID: Theresa Perez, female   DOB: [birth year], 64 y.o.   MRN: [id number]   HPI  JAI BEAR is a 64 y.o.-year-old female, referred by her PCP, Dr. Megan Salon, for management of hypothyroidism.  Pt. has been dx with goiter 1990s >> thyroidectomy in 1997 >> hypothyroidism; She was then found to have a 1/16 inch papillary thyroid cancer focus then. 2 LNs were negative. No RAI Tx needed.  She is on Levothyroxine generic 137 mcg (last dose changed from 150 mcg in 2014, taken: - fasting - with water - separated by >30-60 min from b'fast  - + calcium, iron, multivitamins - at night - no PPI  She recently switched providers: from Dr Arnoldo Morale to Roxy Cedar and she was advised to decrease her LT4 dose 2/2 overly suppressed TSH, but she did nor want to decrease the dose as she was always told in the past that the thyroid tests are normal. She was, therefore referred to endocrinology.  I reviewed pt's thyroid tests: Lab Results  Component Value Date   TSH 0.07* 03/04/2014   TSH 0.08* 12/01/2013   TSH 0.04* 11/04/2012   TSH 0.06* 10/29/2012   TSH 0.03* 10/25/2011   TSH 0.04* 10/10/2010   TSH 0.04* 09/15/2009   TSH 0.012 02/25/2009   TSH 0.009  02/24/2009   TSH 0.02* 02/13/2009   FREET4 1.41 11/04/2012   FREET4 1.41 01/05/2010   FREET4 2.09* 02/25/2009   FREET4 2.18* 02/24/2009   FREET4 1.9* 02/13/2009    Pt describes: - + both weight gain and loss - + fatigue - + heat and cold intolerance - + anxiety and depression - + constipation - no dry skin - + mild hair loss  Pt denies feeling nodules in neck, hoarseness, dysphagia/odynophagia, SOB with lying down.  She has a negative FH of thyroid disorders. No FH of thyroid cancer.  No h/o radiation tx to head or neck. No recent use of iodine supplements.  ROS: Constitutional: See history of present illness, + poor sleep Eyes: no blurry vision, no xerophthalmia ENT: + sore throat, no nodules palpated in throat, no  dysphagia/odynophagia, no hoarseness, + decreased hearing  Cardiovascular: no CP/SOB/palpitations/leg swelling Respiratory: + cough/no SOB Gastrointestinal: no N/V/D/+ C/+ heartburn Musculoskeletal: + Both: muscle/joint aches Skin: no rashes, + itching, easy bruising, rash Neurological: no tremors/numbness/tingling/dizziness, + headache Psychiatric: + both:  depression/anxiety + Burning with urination  Past Medical History  Diagnosis Date  . Hypertension   . Hypothyroidism   . Allergy   . Anxiety   . Cyst of breast, right, diffuse fibrocystic   . Cancer     Thyroid-Papillary  . Complication of anesthesia   . PONV (postoperative nausea and vomiting)    Past Surgical History  Procedure Laterality Date  . Aspiration cyst right breast    . Uterine ablation    . Thyroidectomy  1997  . Tonsillectomy  1969  . Vaginal hysterectomy N/A 03/31/2012    Procedure: HYSTERECTOMY VAGINAL;  Surgeon: Gus Height, MD;  Location: [facility];  Service: Gynecology;  Laterality: N/A;  . Anterior and posterior repair N/A 03/31/2012    Procedure: ANTERIOR (CYSTOCELE) AND POSTERIOR REPAIR (RECTOCELE);  Surgeon: Gus Height, MD;  Location: [facility];  Service: Gynecology;  Laterality: N/A;   History   Social History  . Marital Status: Married    Spouse Name: N/A    Number of Children: 1   Occupational History  . retired   Social History Main Topics  . Smoking status:  Current Every Day Smoker -- 0.5 packs/day for 15 years    Types: Cigarettes  . Smokeless tobacco: Never Used     Comment: Occassional smoker  . Alcohol Use: Yes     Comment: Rarely  . Drug Use: No   Current Outpatient Prescriptions on File Prior to Visit  Medication Sig Dispense Refill  . Albuterol Sulfate (PROAIR RESPICLICK) 482 (90 BASE) MCG/ACT AEPB Inhale 1-2 puffs into the lungs 4 (four) times daily as needed. 1 each 1  . Ascorbic Acid (VITAMIN C PO) Take 1 tablet by mouth daily.    [name] atenolol-chlorthalidone (TENORETIC) 50-25 MG  per tablet Take 0.5 tablets by mouth daily.    . Black Cohosh 540 MG CAPS Take 540 mg by mouth 2 (two) times daily.     . budesonide-formoterol (SYMBICORT) 160-4.5 MCG/ACT inhaler Inhale 2 puffs into the lungs 2 (two) times daily. 1 Inhaler 3  . butalbital-acetaminophen-caffeine (ESGIC) 50-325-40 MG per tablet Take 1 tablet by mouth 2 (two) times daily as needed for headache. 30 tablet 1  . CALCIUM PO Take 1 tablet by mouth daily.    [name] COENZYME Q-10 PO Take 1 tablet by mouth daily.    . Cyanocobalamin (VITAMIN B-12 PO) Take 1 tablet by mouth daily.    . DULoxetine (CYMBALTA) 60 MG capsule Take 60 mg by mouth 2 (two) times daily.    [name] ECHINACEA PO Take 1 tablet by mouth daily.    [name] EVENING PRIMROSE OIL PO Take 1 tablet by mouth daily.    . ferrous sulfate 325 (65 FE) MG tablet Take 325 mg by mouth every evening.     . fluticasone (FLONASE) 50 MCG/ACT nasal spray Place 2 sprays into both nostrils daily.    . Ginkgo Biloba (GNP GINGKO BILOBA EXTRACT PO) Take 1 tablet by mouth daily.    [name] LORazepam (ATIVAN) 0.5 MG tablet Take 1 tablet (0.5 mg total) by mouth every 8 (eight) hours as needed for anxiety. 30 tablet 0  . Misc Natural Products (GLUCOSAMINE CHOND COMPLEX/MSM) TABS Take 1 tablet by mouth 2 (two) times daily.    . Multiple Vitamin (ANTIOXIDANT FORMULA PO) Take 1 tablet by mouth daily.    . Multiple Vitamins-Minerals (MULTIVITAMIN WITH MINERALS) tablet Take 1 tablet by mouth daily.    . Nutritional Supplements (DHEA PO) Take 1 tablet by mouth daily.    . Soft Lens Products (SALINE SENSITIVE EYES) SOLN Place 1 drop into both eyes daily.    [name] levothyroxine (SYNTHROID, LEVOTHROID) 125 MCG tablet Take 1 tablet (125 mcg total) by mouth daily. (Patient not taking: Reported on 03/23/2014) 30 tablet 3   No current facility-administered medications on file prior to visit.   No Known Allergies Family History  Problem Relation Age of Onset  . Diabetes Mother   . Deep vein thrombosis Mother      Varicose Veins  . Cancer Father     Father died from brain tumor.  [name] Heart disease Father   . Colon cancer Neg Hx   . Stomach cancer Neg Hx    PE: BP 102/62 mmHg  Pulse 81  Temp(Src) 98 F (36.7 C) (Oral)  Resp 12  Ht 5' 10.5" (1.791 m)  Wt 165 lb 6.4 oz (75.025 kg)  BMI 23.39 kg/m2  SpO2 95% Wt Readings from Last 3 Encounters:  03/23/14 165 lb 6.4 oz (75.025 kg)  03/04/14 162 lb 6.4 oz (73.664 kg)  02/01/14 160 lb 9.6 oz (72.848 kg)   Constitutional: Normal  weight, in NAD Eyes: PERRLA, EOMI, no exophthalmos ENT: moist mucous membranes, no thyroid masses, cervical scar healed, no cervical lymphadenopathy Cardiovascular: RRR, No MRG Respiratory: CTA B Gastrointestinal: abdomen soft, NT, ND, BS+ Musculoskeletal: no deformities, strength intact in all 4 Skin: moist, warm, no rashes Neurological: + Mild tremor with outstretched hands, DTR +3/4 in all 4  ASSESSMENT: 1. Hypothyroidism  2. Distant history of subcentimeter papillary thyroid cancer - She did not require radioactive iodine treatment, only total thyroidectomy  PLAN:  1. Patient with long-standing hypothyroidism, on levothyroxine therapy. She appears euthyroid. She does not appear to have a goiter, thyroid nodules, or neck compression symptoms - We reviewed her thyroid test together, starting back to 2010, and I explained that they were all suppressed. I agree with decreasing the dose of levothyroxine so that her TSH will get back to the normal range. For now, I advised her to start the levothyroxine 125 g daily, as advised by her PCP. We will probably need to decrease his even further, but I would not like to make large dose changes, since this can make her feel poorly. - We discussed about correct intake of levothyroxine, fasting, with water, separated by at least 30 minutes from breakfast, and separated by more than 4 hours from calcium, iron, multivitamins, acid reflux medications (PPIs). - will check thyroid tests  in 5-6 weeks after starting the new LT 4 dose TSH, free T4 - If these are abnormal, she will need to return in 6-8 weeks for repeat labs - If these are normal, I will see her back in 4 months Pt was advised to: Patient Instructions  Please fill the Levothyroxine 125 mcg dose.  Take the thyroid hormone every day, with water, >30 minutes before breakfast, separated by >4 hours from acid reflux medications, calcium, iron, multivitamins.  Please come back for labs in 5-6 weeks.  Please come back for a follow-up appointment in 4 months.  2. Distant history of PTC - The patient had an incidentally found subcentimeter focus of PTC at the time of her total thyroidectomy 1997. She had no recurring masses or any neck compression symptoms. I would consider her cured, especially since there are no lymphadenopathy or any thyroid masses felt in her neck. I do not feel the need to repeat thyroid ultrasound as of now. - We discussed about the common practice in the past to suppress the TSH to very low levels after diagnosis of thyroid cancer, in an attempt to keep the cancer in remission. I believe that this might be why her TSH were kept suppressed in the past years. However, recently, it is recommended to only keep the TSH suppressed in the first year after treatment of thyroid cancer and relax the TSH target to the normal range afterwards.

## 2014-03-23 NOTE — Patient Instructions (Signed)
Please fill the Levothyroxine 125 mcg dose.  Take the thyroid hormone every day, with water, >30 minutes before breakfast, separated by >4 hours from acid reflux medications, calcium, iron, multivitamins.  Please come back for labs in 5-6 weeks.  Please come back for a follow-up appointment in 4 months.

## 2014-04-04 ENCOUNTER — Telehealth: Payer: Self-pay

## 2014-04-04 MED ORDER — FLUTICASONE PROPIONATE 50 MCG/ACT NA SUSP: 2.0000 | Freq: Every day | NASAL | Status: DC

## 2014-04-04 NOTE — Telephone Encounter (Signed)
Refill request for [facility]

## 2014-04-21 ENCOUNTER — Other Ambulatory Visit: Payer: Self-pay | Admitting: Family

## 2014-04-22 ENCOUNTER — Other Ambulatory Visit: Payer: Self-pay | Admitting: *Deleted

## 2014-04-22 MED ORDER — ATENOLOL-CHLORTHALIDONE 50-25 MG PO TABS: 0.5000 | ORAL_TABLET | Freq: Every day | ORAL | Status: DC

## 2014-05-04 ENCOUNTER — Other Ambulatory Visit: Payer: Self-pay | Admitting: Family

## 2014-05-06 ENCOUNTER — Other Ambulatory Visit: Payer: Self-pay | Admitting: *Deleted

## 2014-05-06 ENCOUNTER — Other Ambulatory Visit (INDEPENDENT_AMBULATORY_CARE_PROVIDER_SITE_OTHER): Payer: BC Managed Care – PPO

## 2014-05-06 DIAGNOSIS — E039 Hypothyroidism, unspecified: Secondary | ICD-10-CM

## 2014-05-06 LAB — T4, FREE: FREE T4: 1.05 ng/dL (ref 0.60–1.60)

## 2014-05-06 LAB — TSH: TSH: 0.09 u[IU]/mL — AB (ref 0.35–4.50)

## 2014-05-09 ENCOUNTER — Other Ambulatory Visit: Payer: Self-pay | Admitting: Internal Medicine

## 2014-05-09 DIAGNOSIS — E039 Hypothyroidism, unspecified: Secondary | ICD-10-CM

## 2014-05-09 MED ORDER — LEVOTHYROXINE SODIUM 112 MCG PO TABS: 112.0000 ug | ORAL_TABLET | Freq: Every day | ORAL | Status: DC

## 2014-05-18 ENCOUNTER — Telehealth: Payer: Self-pay | Admitting: Geriatric Medicine

## 2014-05-18 MED ORDER — BUTALBITAL-APAP-CAFFEINE 50-325-40 MG PO TABS: 1.0000 | ORAL_TABLET | Freq: Two times a day (BID) | ORAL | Status: DC | PRN

## 2014-05-19 ENCOUNTER — Other Ambulatory Visit: Payer: Self-pay | Admitting: Geriatric Medicine

## 2014-05-19 MED ORDER — BUTALBITAL-APAP-CAFFEINE 50-325-40 MG PO TABS: 1.0000 | ORAL_TABLET | Freq: Two times a day (BID) | ORAL | Status: DC | PRN

## 2014-05-19 NOTE — Telephone Encounter (Signed)
Sent to pharmacy 

## 2014-05-19 NOTE — Telephone Encounter (Signed)
Pt called in and said that she rarely uses this med but has been having headaches and needs a refill on this

## 2014-06-15 ENCOUNTER — Other Ambulatory Visit (INDEPENDENT_AMBULATORY_CARE_PROVIDER_SITE_OTHER): Payer: BC Managed Care – PPO

## 2014-06-15 DIAGNOSIS — E039 Hypothyroidism, unspecified: Secondary | ICD-10-CM

## 2014-06-15 LAB — TSH: TSH: 0.17 u[IU]/mL — AB (ref 0.35–4.50)

## 2014-06-15 LAB — T4, FREE: Free T4: 0.74 ng/dL (ref 0.60–1.60)

## 2014-06-16 ENCOUNTER — Other Ambulatory Visit: Payer: Self-pay | Admitting: Internal Medicine

## 2014-06-16 DIAGNOSIS — E039 Hypothyroidism, unspecified: Secondary | ICD-10-CM

## 2014-06-16 MED ORDER — LEVOTHYROXINE SODIUM 100 MCG PO TABS: 100.0000 ug | ORAL_TABLET | Freq: Every day | ORAL | Status: DC

## 2014-06-20 ENCOUNTER — Other Ambulatory Visit: Payer: Self-pay | Admitting: Family

## 2014-06-21 ENCOUNTER — Other Ambulatory Visit: Payer: Self-pay | Admitting: Family

## 2014-06-22 ENCOUNTER — Other Ambulatory Visit: Payer: Self-pay | Admitting: Geriatric Medicine

## 2014-06-22 MED ORDER — DULOXETINE HCL 60 MG PO CPEP: 60.0000 mg | ORAL_CAPSULE | Freq: Two times a day (BID) | ORAL | Status: DC

## 2014-07-01 ENCOUNTER — Other Ambulatory Visit: Payer: Self-pay | Admitting: Family

## 2014-07-07 ENCOUNTER — Ambulatory Visit: Payer: BC Managed Care – PPO | Admitting: Internal Medicine

## 2014-07-12 ENCOUNTER — Other Ambulatory Visit: Payer: Self-pay | Admitting: Family

## 2014-07-13 ENCOUNTER — Ambulatory Visit (INDEPENDENT_AMBULATORY_CARE_PROVIDER_SITE_OTHER): Payer: BC Managed Care – PPO | Admitting: Internal Medicine

## 2014-07-13 ENCOUNTER — Encounter: Payer: Self-pay | Admitting: Internal Medicine

## 2014-07-13 VITALS — BP 132/82 | HR 71 | Temp 98.4°F | Resp 12 | Ht 70.0 in | Wt 179.1 lb

## 2014-07-13 DIAGNOSIS — Z23 Encounter for immunization: Secondary | ICD-10-CM

## 2014-07-13 DIAGNOSIS — Z1211 Encounter for screening for malignant neoplasm of colon: Secondary | ICD-10-CM

## 2014-07-13 DIAGNOSIS — K5900 Constipation, unspecified: Secondary | ICD-10-CM | POA: Diagnosis not present

## 2014-07-13 DIAGNOSIS — Z299 Encounter for prophylactic measures, unspecified: Secondary | ICD-10-CM

## 2014-07-13 HISTORY — DX: Constipation, unspecified: K59.00

## 2014-07-13 NOTE — Patient Instructions (Signed)
We will give you the shingles shot today.   We have also let the GI office know to call you and get things scheduled to do the colonoscopy.   For the constipation work on increasing the fiber in your diet (suggestions below) and drink plenty of water and liquids (6-8 glasses of water, coffee, juice per day).   You can also use the miralax every day or every other day to keep yourself regular.   High-Fiber Diet Fiber is found in fruits, vegetables, and grains. A high-fiber diet encourages the addition of more whole grains, legumes, fruits, and vegetables in your diet. The recommended amount of fiber for adult males is 38 g per day. For adult females, it is 25 g per day. Pregnant and lactating women should get 28 g of fiber per day. If you have a digestive or bowel problem, ask your caregiver for advice before adding high-fiber foods to your diet. Eat a variety of high-fiber foods instead of only a select few type of foods.  PURPOSE  To increase stool bulk.  To make bowel movements more regular to prevent constipation.  To lower cholesterol.  To prevent overeating. WHEN IS THIS DIET USED?  It may be used if you have constipation and hemorrhoids.  It may be used if you have uncomplicated diverticulosis (intestine condition) and irritable bowel syndrome.  It may be used if you need help with weight management.  It may be used if you want to add it to your diet as a protective measure against atherosclerosis, diabetes, and cancer. SOURCES OF FIBER  Whole-grain breads and cereals.  Fruits, such as apples, oranges, bananas, berries, prunes, and pears.  Vegetables, such as green peas, carrots, sweet potatoes, beets, broccoli, cabbage, spinach, and artichokes.  Legumes, such split peas, soy, lentils.  Almonds. FIBER CONTENT IN FOODS Starches and Grains / Dietary Fiber (g)  Cheerios, 1 cup / 3 g  Corn Flakes cereal, 1 cup / 0.7 g  Rice crispy treat cereal, 1 cup / 0.3  g  Instant oatmeal (cooked),  cup / 2 g  Frosted wheat cereal, 1 cup / 5.1 g  Brown, long-grain rice (cooked), 1 cup / 3.5 g  White, long-grain rice (cooked), 1 cup / 0.6 g  Enriched macaroni (cooked), 1 cup / 2.5 g Legumes / Dietary Fiber (g)  Baked beans (canned, plain, or vegetarian),  cup / 5.2 g  Kidney beans (canned),  cup / 6.8 g  Pinto beans (cooked),  cup / 5.5 g Breads and Crackers / Dietary Fiber (g)  Plain or honey graham crackers, 2 squares / 0.7 g  Saltine crackers, 3 squares / 0.3 g  Plain, salted pretzels, 10 pieces / 1.8 g  Whole-wheat bread, 1 slice / 1.9 g  White bread, 1 slice / 0.7 g  Raisin bread, 1 slice / 1.2 g  Plain bagel, 3 oz / 2 g  Flour tortilla, 1 oz / 0.9 g  Corn tortilla, 1 small / 1.5 g  Hamburger or hotdog bun, 1 small / 0.9 g Fruits / Dietary Fiber (g)  Apple with skin, 1 medium / 4.4 g  Sweetened applesauce,  cup / 1.5 g  Banana,  medium / 1.5 g  Grapes, 10 grapes / 0.4 g  Orange, 1 small / 2.3 g  Raisin, 1.5 oz / 1.6 g  Melon, 1 cup / 1.4 g Vegetables / Dietary Fiber (g)  Green beans (canned),  cup / 1.3 g  Carrots (cooked),  cup /  2.3 g  Broccoli (cooked),  cup / 2.8 g  Peas (cooked),  cup / 4.4 g  Mashed potatoes,  cup / 1.6 g  Lettuce, 1 cup / 0.5 g  Corn (canned),  cup / 1.6 g  Tomato,  cup / 1.1 g Document Released: 02/04/2005 Document Revised: 08/06/2011 Document Reviewed: 05/09/2011 [facility] Patient Information 2015 [place], [place]. This information is not intended to replace advice given to you by your health care provider. Make sure you discuss any questions you have with your health care provider.

## 2014-07-13 NOTE — Assessment & Plan Note (Signed)
Although she has not had weight loss is concerning given that she has not had colon cancer screening. Referral to GI placed today and impressed upon her the need for this test. Talked to her about Adequate water intake and miralax for constipation. She does get good fiber in her diet. No blood in her stools. Last Hg normal.

## 2014-07-13 NOTE — Progress Notes (Signed)
   Subjective:    Patient ID: Theresa Perez, female    DOB: [birth year], 64 y.o.   MRN: [id number]  HPI The patient is a 64 YO female coming in for constipation. This is a new problem going on for 1-2 months. She has tried increasing the fiber in her diet. She is not sure if she is drinking enough water. She knows as well that she is due for colonoscopy and has never had one. No blood in her stools. No weight change. She has not tried any medication. Goes 1-3 days between stools and can be hard to pass. Denies hemorrhoids. Eating about the same as before.   Review of Systems  Constitutional: Negative for fever, activity change, appetite change, fatigue and unexpected weight change.  Respiratory: Negative for cough, chest tightness, shortness of breath and wheezing.   Cardiovascular: Negative for chest pain, palpitations and leg swelling.  Gastrointestinal: Positive for constipation. Negative for nausea, abdominal pain, diarrhea, blood in stool and abdominal distention.  Musculoskeletal: Negative.   Skin: Negative.   Neurological: Negative.   Psychiatric/Behavioral: Negative.       Objective:   Physical Exam  Constitutional: She is oriented to person, place, and time. She appears well-developed and well-nourished.  HENT:  Head: Normocephalic and atraumatic.  Eyes: EOM are normal.  Neck: Normal range of motion.  Cardiovascular: Normal rate and regular rhythm.   Pulmonary/Chest: Effort normal. No respiratory distress. She has no wheezes. She has no rales.  Abdominal: Soft. Bowel sounds are normal. She exhibits no distension. There is no tenderness. There is no rebound and no guarding.  Musculoskeletal: She exhibits no edema.  Neurological: She is alert and oriented to person, place, and time.  Skin: Skin is warm and dry.  Psychiatric: She has a normal mood and affect.   Filed Vitals:   07/13/14 0903  BP: 132/82  Pulse: 71  Temp: 98.4 F (36.9 C)  TempSrc: Oral  Resp: 12    Height: 5\' 10"  (1.778 m)  Weight: 179 lb 1.9 oz (81.248 kg)  SpO2: 96%      Assessment & Plan:  Shingles shot given during visit.

## 2014-07-14 ENCOUNTER — Other Ambulatory Visit: Payer: Self-pay | Admitting: Family

## 2014-07-19 ENCOUNTER — Encounter: Payer: Self-pay | Admitting: Gastroenterology

## 2014-07-22 ENCOUNTER — Ambulatory Visit (INDEPENDENT_AMBULATORY_CARE_PROVIDER_SITE_OTHER): Payer: BC Managed Care – PPO | Admitting: Internal Medicine

## 2014-07-22 ENCOUNTER — Encounter: Payer: Self-pay | Admitting: Internal Medicine

## 2014-07-22 VITALS — BP 132/80 | HR 73 | Temp 98.3°F | Resp 12 | Wt 175.6 lb

## 2014-07-22 DIAGNOSIS — E890 Postprocedural hypothyroidism: Secondary | ICD-10-CM

## 2014-07-22 DIAGNOSIS — C73 Malignant neoplasm of thyroid gland: Secondary | ICD-10-CM

## 2014-07-22 DIAGNOSIS — E039 Hypothyroidism, unspecified: Secondary | ICD-10-CM

## 2014-07-22 LAB — TSH: TSH: 0.16 u[IU]/mL — ABNORMAL LOW (ref 0.35–4.50)

## 2014-07-22 NOTE — Patient Instructions (Signed)
Please stop at the lab..  Please come back for a follow-up appointment in 6 months.  Lab Results  Component Value Date   TSH 0.07* 03/04/2014   TSH 0.08* 12/01/2013   TSH 0.04* 11/04/2012   TSH 0.06* 10/29/2012   TSH 0.03* 10/25/2011   TSH 0.04* 10/10/2010   TSH 0.04* 09/15/2009   TSH 0.012 02/25/2009   TSH 0.009  02/24/2009   TSH 0.02* 02/13/2009   FREET4 1.41 11/04/2012   FREET4 1.41 01/05/2010   FREET4 2.09* 02/25/2009   FREET4 2.18* 02/24/2009   FREET4 1.9* 02/13/2009

## 2014-07-22 NOTE — Progress Notes (Signed)
Patient ID: Theresa Perez, female   DOB: [birth year], 64 y.o.   MRN: [id number]   HPI  Theresa Perez is a 64 y.o.-year-old female, returning for follow-up for postsurgical hypothyroidism and distant history of papillary thyroid cancer. Last visit 4 mo ago.  Reviewed history: Pt. has been dx with goiter 1990s >> thyroidectomy in 1997 >> hypothyroidism; She was then found to have a 1/16 inch papillary thyroid cancer focus then. 2 LNs were negative. No RAI Tx needed.  Patient was on high doses of levothyroxine for a very long period of time >> we started to decrease the dose of levothyroxine after our first visit, now on 100 g daily (started after last set of labs, in 05/2014), taken - fasting - with water - separated by >30-60 min from b'fast  - + calcium, iron, multivitamins - at night - no PPI  I reviewed pt's thyroid tests -improving now we decrease levothyroxine dose: Component     Latest Ref Rng 05/06/2014 06/15/2014  TSH     0.35 - 4.50 uIU/mL 0.09 (L) 0.17 (L)  Free T4     0.60 - 1.60 ng/dL 1.05 0.74  Previously: Lab Results  Component Value Date   TSH 0.07* 03/04/2014   TSH 0.08* 12/01/2013   TSH 0.04* 11/04/2012   TSH 0.06* 10/29/2012   TSH 0.03* 10/25/2011   TSH 0.04* 10/10/2010   TSH 0.04* 09/15/2009   TSH 0.012 02/25/2009   TSH 0.009  02/24/2009   TSH 0.02* 02/13/2009   FREET4 1.41 11/04/2012   FREET4 1.41 01/05/2010   FREET4 2.09* 02/25/2009   FREET4 2.18* 02/24/2009   FREET4 1.9* 02/13/2009    Pt describes: - + weight gain  - no fatigue - no heat and cold intolerance - + constipation - no dry skin - no hair loss  Pt denies feeling nodules in neck, hoarseness, dysphagia/odynophagia, SOB with lying down.   ROS: Constitutional: See history of present illness Eyes: no blurry vision, no xerophthalmia ENT: nosore throat, no nodules palpated in throat, no dysphagia/odynophagia, no hoarseness, + decreased hearing  Cardiovascular: no  CP/SOB/palpitations/leg swelling Respiratory: nocough/no SOB Gastrointestinal: no N/V/D/+ C/+ heartburn Musculoskeletal: no  muscle/joint aches Skin: no rashes Neurological: no tremors/numbness/tingling/dizziness  I reviewed pt's medications, allergies, PMH, social hx, family hx, and changes were documented in the history of present illness. Otherwise, unchanged from my initial visit note.  Past Medical History  Diagnosis Date  . Hypertension   . Hypothyroidism   . Allergy   . Anxiety   . Cyst of breast, right, diffuse fibrocystic   . Cancer     Thyroid-Papillary  . Complication of anesthesia   . PONV (postoperative nausea and vomiting)    Past Surgical History  Procedure Laterality Date  . Aspiration cyst right breast    . Uterine ablation    . Thyroidectomy  1997  . Tonsillectomy  1969  . Vaginal hysterectomy N/A 03/31/2012    Procedure: HYSTERECTOMY VAGINAL;  Surgeon: Gus Height, MD;  Location: [facility];  Service: Gynecology;  Laterality: N/A;  . Anterior and posterior repair N/A 03/31/2012    Procedure: ANTERIOR (CYSTOCELE) AND POSTERIOR REPAIR (RECTOCELE);  Surgeon: Gus Height, MD;  Location: [facility];  Service: Gynecology;  Laterality: N/A;   History   Social History  . Marital Status: Married    Spouse Name: N/A    Number of Children: 1   Occupational History  . retired   Social History Main Topics  . Smoking status: Current Every  Day Smoker -- 0.5 packs/day for 15 years    Types: Cigarettes  . Smokeless tobacco: Never Used     Comment: Occassional smoker  . Alcohol Use: Yes     Comment: Rarely  . Drug Use: No   Current Outpatient Prescriptions on File Prior to Visit  Medication Sig Dispense Refill  . Albuterol Sulfate (PROAIR RESPICLICK) 151 (90 BASE) MCG/ACT AEPB Inhale 1-2 puffs into the lungs 4 (four) times daily as needed. 1 each 1  . Ascorbic Acid (VITAMIN C PO) Take 1 tablet by mouth daily.    [name] atenolol-chlorthalidone (TENORETIC) 50-25 MG per tablet  TAKE ONE-HALF TABLET BY MOUTH ONCE DAILY 45 tablet 1  . Black Cohosh 540 MG CAPS Take 540 mg by mouth 2 (two) times daily.     . butalbital-acetaminophen-caffeine (ESGIC) 50-325-40 MG per tablet Take 1 tablet by mouth 2 (two) times daily as needed for headache. 30 tablet 1  . CALCIUM PO Take 1 tablet by mouth daily.    [name] COENZYME Q-10 PO Take 1 tablet by mouth daily.    . Cyanocobalamin (VITAMIN B-12 PO) Take 1 tablet by mouth daily.    . DULoxetine (CYMBALTA) 60 MG capsule Take 1 capsule (60 mg total) by mouth 2 (two) times daily. 60 capsule 5  . ECHINACEA PO Take 1 tablet by mouth daily.    [name] EVENING PRIMROSE OIL PO Take 1 tablet by mouth daily.    . ferrous sulfate 325 (65 FE) MG tablet Take 325 mg by mouth every evening.     . fluticasone (FLONASE) 50 MCG/ACT nasal spray USE TWO SPRAY(S) IN EACH NOSTRIL ONCE DAILY 16 g 3  . Ginkgo Biloba (GNP GINGKO BILOBA EXTRACT PO) Take 1 tablet by mouth daily.    [name] levothyroxine (SYNTHROID, LEVOTHROID) 100 MCG tablet Take 1 tablet (100 mcg total) by mouth daily. 45 tablet 1  . LORazepam (ATIVAN) 0.5 MG tablet Take 1 tablet (0.5 mg total) by mouth every 8 (eight) hours as needed for anxiety. 30 tablet 0  . Misc Natural Products (GLUCOSAMINE CHOND COMPLEX/MSM) TABS Take 1 tablet by mouth 2 (two) times daily.    . Multiple Vitamin (ANTIOXIDANT FORMULA PO) Take 1 tablet by mouth daily.    . Multiple Vitamins-Minerals (MULTIVITAMIN WITH MINERALS) tablet Take 1 tablet by mouth daily.    . Nutritional Supplements (DHEA PO) Take 1 tablet by mouth daily.    [name] PREVIDENT 5000 SENSITIVE 1.1-5 % PSTE     . Soft Lens Products (SALINE SENSITIVE EYES) SOLN Place 1 drop into both eyes daily.    . SYMBICORT 160-4.5 MCG/ACT inhaler INHALE TWO PUFFS BY MOUTH TWICE DAILY 1 Inhaler 11   No current facility-administered medications on file prior to visit.   No Known Allergies Family History  Problem Relation Age of Onset  . Diabetes Mother   . Deep vein thrombosis  Mother     Varicose Veins  . Cancer Father     Father died from brain tumor.  [name] Heart disease Father   . Colon cancer Neg Hx   . Stomach cancer Neg Hx    PE: BP 132/80 mmHg  Pulse 73  Temp(Src) 98.3 F (36.8 C) (Oral)  Resp 12  Wt 175 lb 9.6 oz (79.652 kg)  SpO2 96% Body mass index is 25.2 kg/(m^2). Wt Readings from Last 3 Encounters:  07/22/14 175 lb 9.6 oz (79.652 kg)  07/13/14 179 lb 1.9 oz (81.248 kg)  03/23/14 165 lb 6.4 oz (75.025 kg)  Constitutional: Normal weight, in NAD Eyes: PERRLA, EOMI, no exophthalmos ENT: moist mucous membranes, no thyroid masses, cervical scar healed, no cervical lymphadenopathy Cardiovascular: RRR, No MRG Respiratory: CTA B Gastrointestinal: abdomen soft, NT, ND, BS+ Musculoskeletal: no deformities, strength intact in all 4 Skin: moist, warm, no rashes Neurological: no tremor with outstretched hands, DTR +3/4 in all 4  ASSESSMENT: 1. Hypothyroidism  2. Distant history of subcentimeter papillary thyroid cancer - She did not require radioactive iodine treatment, only total thyroidectomy  PLAN:  1. Patient with long-standing hypothyroidism, on levothyroxine therapy. She appears euthyroid. She does not appear to have a goiter, thyroid nodules, or neck compression symptoms - We again reviewed her thyroid test together (upon her request), starting back to 2010, and I explained that they were all suppressed (probably 2/2 to her PTC hx). However, she is now improving her TSH with decreasing the LT4 dose - for now, continue 100 mcg daily of LT4 - We discussed about correct intake of levothyroxine, fasting, with water, separated by at least 30 minutes from breakfast, and separated by more than 4 hours from calcium, iron, multivitamins, acid reflux medications (PPIs). She is taking it correctly. - will check thyroid tests today - I will see her back in 6 months  2. Distant history of PTC - The patient had an incidentally found subcentimeter focus  of PTC at the time of her total thyroidectomy 1997. She had no recurring masses or any neck compression symptoms. I would consider her cured, especially since there are no lymphadenopathy or any thyroid masses felt in her neck. I do not feel the need to repeat thyroid ultrasound as of now.  Component     Latest Ref Rng 06/15/2014 07/22/2014  TSH     0.35 - 4.50 uIU/mL 0.17 (L) 0.16 (L)  Free T4     0.60 - 1.60 ng/dL 0.74 0.72   TSH is still suppressed despite decreasing the dose of levothyroxine. We'll try to decrease the dose to 88 g daily. We'll need repeat thyroid labs in 6 weeks.

## 2014-07-25 LAB — T4, FREE: Free T4: 0.72 ng/dL (ref 0.60–1.60)

## 2014-07-25 MED ORDER — LEVOTHYROXINE SODIUM 88 MCG PO TABS: 88.0000 ug | ORAL_TABLET | Freq: Every day | ORAL | Status: DC

## 2014-08-11 ENCOUNTER — Other Ambulatory Visit: Payer: BC Managed Care – PPO

## 2014-08-29 ENCOUNTER — Ambulatory Visit (INDEPENDENT_AMBULATORY_CARE_PROVIDER_SITE_OTHER): Payer: BC Managed Care – PPO | Admitting: Family Medicine

## 2014-08-29 VITALS — BP 114/76 | HR 62 | Temp 97.7°F | Resp 18 | Ht 70.5 in | Wt 182.0 lb

## 2014-08-29 DIAGNOSIS — T63441A Toxic effect of venom of bees, accidental (unintentional), initial encounter: Secondary | ICD-10-CM

## 2014-08-29 MED ORDER — METHYLPREDNISOLONE ACETATE 80 MG/ML IJ SUSP
80.0000 mg | Freq: Once | INTRAMUSCULAR | Status: AC
  Administered 2014-08-29: 80 mg via INTRAMUSCULAR

## 2014-08-29 MED ORDER — TRIAMCINOLONE ACETONIDE 0.1 % EX CREA: 1.0000 "application " | TOPICAL_CREAM | Freq: Two times a day (BID) | CUTANEOUS | Status: DC

## 2014-08-29 NOTE — Progress Notes (Signed)
  Subjective:  Patient ID: Theresa Perez, female    DOB: 01-28-1951  Age: 64 y.o. MRN: [id number]  No acute distress. She was stung last night in her yard. Start on the base of her right foot. It has been itching and more swollen and red today. She has a history of getting bad local reactions to bee stings in the past. She does not know exactly what it was that got her. Objective:   No acute distress. Erythema arch of right foot. No staying could be seen. The area of erythema goes medially around the arch up toward the top of the foot, not quite to the malleolus. Mild to moderate swelling  Assessment & Plan:   Assessment: Bee sting with local reaction  Plan: Patient Instructions  You will receive Depo-Medrol 80 (long-acting cortisone shot) today  Take Zyrtec one daily or twice daily for antihistamine (cetirizine)  Use triamcinolone cream twice daily or 3 times daily on the rash  If it appears to be getting worse with a red streak running on up the leg or anything of such concern please get rechecked     HOPPER,DAVID, MD 08/29/2014

## 2014-08-29 NOTE — Patient Instructions (Signed)
You will receive Depo-Medrol 80 (long-acting cortisone shot) today  Take Zyrtec one daily or twice daily for antihistamine (cetirizine)  Use triamcinolone cream twice daily or 3 times daily on the rash  If it appears to be getting worse with a red streak running on up the leg or anything of such concern please get rechecked

## 2014-09-13 ENCOUNTER — Other Ambulatory Visit (INDEPENDENT_AMBULATORY_CARE_PROVIDER_SITE_OTHER): Payer: BC Managed Care – PPO

## 2014-09-13 DIAGNOSIS — E890 Postprocedural hypothyroidism: Secondary | ICD-10-CM | POA: Diagnosis not present

## 2014-09-13 LAB — T4, FREE: Free T4: 0.81 ng/dL (ref 0.60–1.60)

## 2014-09-13 LAB — TSH: TSH: 0.56 u[IU]/mL (ref 0.35–4.50)

## 2014-09-19 ENCOUNTER — Ambulatory Visit: Payer: BC Managed Care – PPO | Admitting: Gastroenterology

## 2014-10-25 ENCOUNTER — Other Ambulatory Visit: Payer: Self-pay | Admitting: Internal Medicine

## 2014-10-30 ENCOUNTER — Other Ambulatory Visit: Payer: Self-pay | Admitting: Internal Medicine

## 2014-12-11 ENCOUNTER — Other Ambulatory Visit: Payer: Self-pay | Admitting: Internal Medicine

## 2014-12-16 ENCOUNTER — Encounter: Payer: Self-pay | Admitting: Internal Medicine

## 2014-12-16 ENCOUNTER — Other Ambulatory Visit (INDEPENDENT_AMBULATORY_CARE_PROVIDER_SITE_OTHER): Payer: BC Managed Care – PPO

## 2014-12-16 ENCOUNTER — Ambulatory Visit (INDEPENDENT_AMBULATORY_CARE_PROVIDER_SITE_OTHER): Payer: BC Managed Care – PPO | Admitting: Internal Medicine

## 2014-12-16 VITALS — BP 128/72 | HR 76 | Temp 98.7°F | Resp 14 | Ht 70.5 in | Wt 190.0 lb

## 2014-12-16 DIAGNOSIS — I10 Essential (primary) hypertension: Secondary | ICD-10-CM

## 2014-12-16 DIAGNOSIS — Z0000 Encounter for general adult medical examination without abnormal findings: Secondary | ICD-10-CM | POA: Diagnosis not present

## 2014-12-16 DIAGNOSIS — E7800 Pure hypercholesterolemia, unspecified: Secondary | ICD-10-CM | POA: Diagnosis not present

## 2014-12-16 LAB — COMPREHENSIVE METABOLIC PANEL
ALT: 44 U/L — AB (ref 0–35)
AST: 51 U/L — ABNORMAL HIGH (ref 0–37)
Albumin: 4.2 g/dL (ref 3.5–5.2)
Alkaline Phosphatase: 49 U/L (ref 39–117)
BILIRUBIN TOTAL: 0.6 mg/dL (ref 0.2–1.2)
BUN: 11 mg/dL (ref 6–23)
CHLORIDE: 101 meq/L (ref 96–112)
CO2: 30 meq/L (ref 19–32)
Calcium: 9.2 mg/dL (ref 8.4–10.5)
Creatinine, Ser: 0.68 mg/dL (ref 0.40–1.20)
GFR: 92.83 mL/min (ref >60.00)
Glucose, Bld: 103 mg/dL — ABNORMAL HIGH (ref 70–99)
Potassium: 3.7 mEq/L (ref 3.5–5.1)
Sodium: 141 mEq/L (ref 135–145)
Total Protein: 7.1 g/dL (ref 6.0–8.3)

## 2014-12-16 LAB — LIPID PANEL
CHOL/HDL RATIO: 4
Cholesterol: 189 mg/dL (ref 0–200)
HDL: 48.1 mg/dL (ref >39.00)
LDL Cholesterol: 115 mg/dL — ABNORMAL HIGH (ref 0–99)
NonHDL: 140.88
Triglycerides: 128 mg/dL (ref 0.0–149.0)
VLDL: 25.6 mg/dL (ref 0.0–40.0)

## 2014-12-16 MED ORDER — ATENOLOL-CHLORTHALIDONE 50-25 MG PO TABS: 0.5000 | ORAL_TABLET | Freq: Every day | ORAL | Status: DC

## 2014-12-16 NOTE — Patient Instructions (Signed)
We have sent in the refills, we will check the blood work today and call you back with the results.   You can stop taking the symbicort and see if you notice a difference. If you do not notice a difference stay off it.   Health Maintenance, Female Adopting a healthy lifestyle and getting preventive care can go a long way to promote health and wellness. Talk with your health care provider about what schedule of regular examinations is right for you. This is a good chance for you to check in with your provider about disease prevention and staying healthy. In between checkups, there are plenty of things you can do on your own. Experts have done a lot of research about which lifestyle changes and preventive measures are most likely to keep you healthy. Ask your health care provider for more information. WEIGHT AND DIET  Eat a healthy diet  Be sure to include plenty of vegetables, fruits, low-fat dairy products, and lean protein.  Do not eat a lot of foods high in solid fats, added sugars, or salt.  Get regular exercise. This is one of the most important things you can do for your health.  Most adults should exercise for at least 150 minutes each week. The exercise should increase your heart rate and make you sweat (moderate-intensity exercise).  Most adults should also do strengthening exercises at least twice a week. This is in addition to the moderate-intensity exercise.  Maintain a healthy weight  Body mass index (BMI) is a measurement that can be used to identify possible weight problems. It estimates body fat based on height and weight. Your health care provider can help determine your BMI and help you achieve or maintain a healthy weight.  For females 61 years of age and older:   A BMI below 18.5 is considered underweight.  A BMI of 18.5 to 24.9 is normal.  A BMI of 25 to 29.9 is considered overweight.  A BMI of 30 and above is considered obese.  Watch levels of cholesterol and  blood lipids  You should start having your blood tested for lipids and cholesterol at 63 years of age, then have this test every 5 years.  You may need to have your cholesterol levels checked more often if:  Your lipid or cholesterol levels are high.  You are older than 63 years of age.  You are at high risk for heart disease.  CANCER SCREENING   Lung Cancer  Lung cancer screening is recommended for adults 66-74 years old who are at high risk for lung cancer because of a history of smoking.  A yearly low-dose CT scan of the lungs is recommended for people who:  Currently smoke.  Have quit within the past 15 years.  Have at least a 30-pack-year history of smoking. A pack year is smoking an average of one pack of cigarettes a day for 1 year.  Yearly screening should continue until it has been 15 years since you quit.  Yearly screening should stop if you develop a health problem that would prevent you from having lung cancer treatment.  Breast Cancer  Practice breast self-awareness. This means understanding how your breasts normally appear and feel.  It also means doing regular breast self-exams. Let your health care provider know about any changes, no matter how small.  If you are in your 20s or 30s, you should have a clinical breast exam (CBE) by a health care provider every 1-3 years as part of  a regular health exam.  If you are 70 or older, have a CBE every year. Also consider having a breast X-ray (mammogram) every year.  If you have a family history of breast cancer, talk to your health care provider about genetic screening.  If you are at high risk for breast cancer, talk to your health care provider about having an MRI and a mammogram every year.  Breast cancer gene (BRCA) assessment is recommended for women who have family members with BRCA-related cancers. BRCA-related cancers include:  Breast.  Ovarian.  Tubal.  Peritoneal cancers.  Results of the  assessment will determine the need for genetic counseling and BRCA1 and BRCA2 testing. Cervical Cancer Your health care provider may recommend that you be screened regularly for cancer of the pelvic organs (ovaries, uterus, and vagina). This screening involves a pelvic examination, including checking for microscopic changes to the surface of your cervix (Pap test). You may be encouraged to have this screening done every 3 years, beginning at age 70.  For women ages 55-65, health care providers may recommend pelvic exams and Pap testing every 3 years, or they may recommend the Pap and pelvic exam, combined with testing for human papilloma virus (HPV), every 5 years. Some types of HPV increase your risk of cervical cancer. Testing for HPV may also be done on women of any age with unclear Pap test results.  Other health care providers may not recommend any screening for nonpregnant women who are considered low risk for pelvic cancer and who do not have symptoms. Ask your health care provider if a screening pelvic exam is right for you.  If you have had past treatment for cervical cancer or a condition that could lead to cancer, you need Pap tests and screening for cancer for at least 20 years after your treatment. If Pap tests have been discontinued, your risk factors (such as having a new sexual partner) need to be reassessed to determine if screening should resume. Some women have medical problems that increase the chance of getting cervical cancer. In these cases, your health care provider may recommend more frequent screening and Pap tests. Colorectal Cancer  This type of cancer can be detected and often prevented.  Routine colorectal cancer screening usually begins at 63 years of age and continues through 63 years of age.  Your health care provider may recommend screening at an earlier age if you have risk factors for colon cancer.  Your health care provider may also recommend using home test kits  to check for hidden blood in the stool.  A small camera at the end of a tube can be used to examine your colon directly (sigmoidoscopy or colonoscopy). This is done to check for the earliest forms of colorectal cancer.  Routine screening usually begins at age 28.  Direct examination of the colon should be repeated every 5-10 years through 63 years of age. However, you may need to be screened more often if early forms of precancerous polyps or small growths are found. Skin Cancer  Check your skin from head to toe regularly.  Tell your health care provider about any new moles or changes in moles, especially if there is a change in a mole's shape or color.  Also tell your health care provider if you have a mole that is larger than the size of a pencil eraser.  Always use sunscreen. Apply sunscreen liberally and repeatedly throughout the day.  Protect yourself by wearing long sleeves, pants, a wide-brimmed  hat, and sunglasses whenever you are outside. HEART DISEASE, DIABETES, AND HIGH BLOOD PRESSURE   High blood pressure causes heart disease and increases the risk of stroke. High blood pressure is more likely to develop in:  People who have blood pressure in the high end of the normal range (130-139/85-89 mm Hg).  People who are overweight or obese.  People who are African American.  If you are 82-40 years of age, have your blood pressure checked every 3-5 years. If you are 10 years of age or older, have your blood pressure checked every year. You should have your blood pressure measured twice--once when you are at a hospital or clinic, and once when you are not at a hospital or clinic. Record the average of the two measurements. To check your blood pressure when you are not at a hospital or clinic, you can use:  An automated blood pressure machine at a pharmacy.  A home blood pressure monitor.  If you are between 76 years and 5 years old, ask your health care provider if you should  take aspirin to prevent strokes.  Have regular diabetes screenings. This involves taking a blood sample to check your fasting blood sugar level.  If you are at a normal weight and have a low risk for diabetes, have this test once every three years after 63 years of age.  If you are overweight and have a high risk for diabetes, consider being tested at a younger age or more often. PREVENTING INFECTION  Hepatitis B  If you have a higher risk for hepatitis B, you should be screened for this virus. You are considered at high risk for hepatitis B if:  You were born in a country where hepatitis B is common. Ask your health care provider which countries are considered high risk.  Your parents were born in a high-risk country, and you have not been immunized against hepatitis B (hepatitis B vaccine).  You have HIV or AIDS.  You use needles to inject street drugs.  You live with someone who has hepatitis B.  You have had sex with someone who has hepatitis B.  You get hemodialysis treatment.  You take certain medicines for conditions, including cancer, organ transplantation, and autoimmune conditions. Hepatitis C  Blood testing is recommended for:  Everyone born from [birth year] through 1964.  Anyone with known risk factors for hepatitis C. Sexually transmitted infections (STIs)  You should be screened for sexually transmitted infections (STIs) including gonorrhea and chlamydia if:  You are sexually active and are younger than 63 years of age.  You are older than 63 years of age and your health care provider tells you that you are at risk for this type of infection.  Your sexual activity has changed since you were last screened and you are at an increased risk for chlamydia or gonorrhea. Ask your health care provider if you are at risk.  If you do not have HIV, but are at risk, it may be recommended that you take a prescription medicine daily to prevent HIV infection. This is called  pre-exposure prophylaxis (PrEP). You are considered at risk if:  You are sexually active and do not regularly use condoms or know the HIV status of your partner(s).  You take drugs by injection.  You are sexually active with a partner who has HIV. Talk with your health care provider about whether you are at high risk of being infected with HIV. If you choose to begin PrEP, you  should first be tested for HIV. You should then be tested every 3 months for as long as you are taking PrEP.  PREGNANCY   If you are premenopausal and you may become pregnant, ask your health care provider about preconception counseling.  If you may become pregnant, take 400 to 800 micrograms (mcg) of folic acid every day.  If you want to prevent pregnancy, talk to your health care provider about birth control (contraception). OSTEOPOROSIS AND MENOPAUSE   Osteoporosis is a disease in which the bones lose minerals and strength with aging. This can result in serious bone fractures. Your risk for osteoporosis can be identified using a bone density scan.  If you are 54 years of age or older, or if you are at risk for osteoporosis and fractures, ask your health care provider if you should be screened.  Ask your health care provider whether you should take a calcium or vitamin D supplement to lower your risk for osteoporosis.  Menopause may have certain physical symptoms and risks.  Hormone replacement therapy may reduce some of these symptoms and risks. Talk to your health care provider about whether hormone replacement therapy is right for you.  HOME CARE INSTRUCTIONS   Schedule regular health, dental, and eye exams.  Stay current with your immunizations.   Do not use any tobacco products including cigarettes, chewing tobacco, or electronic cigarettes.  If you are pregnant, do not drink alcohol.  If you are breastfeeding, limit how much and how often you drink alcohol.  Limit alcohol intake to no more than 1  drink per day for nonpregnant women. One drink equals 12 ounces of beer, 5 ounces of wine, or 1 ounces of hard liquor.  Do not use street drugs.  Do not share needles.  Ask your health care provider for help if you need support or information about quitting drugs.  Tell your health care provider if you often feel depressed.  Tell your health care provider if you have ever been abused or do not feel safe at home.   This information is not intended to replace advice given to you by your health care provider. Make sure you discuss any questions you have with your health care provider.   Document Released: 08/20/2010 Document Revised: 02/25/2014 Document Reviewed: 01/06/2013 Elsevier Interactive Patient Education [employer].

## 2014-12-16 NOTE — Assessment & Plan Note (Signed)
BP at goal today on atenolol/chlorthalidone. Checking labs and adjust as needed. No side effects.

## 2014-12-16 NOTE — Progress Notes (Signed)
Pre visit review using our clinic review tool, if applicable. No additional management support is needed unless otherwise documented below in the visit note. 

## 2014-12-16 NOTE — Assessment & Plan Note (Signed)
Checking lipid panel, not on medication, adjust as needed. Exercises regularly.

## 2014-12-16 NOTE — Assessment & Plan Note (Signed)
Checking labs today, declines flu and colonoscopy. Talked to her about needed screening. Given screening recommendations.

## 2014-12-16 NOTE — Progress Notes (Signed)
   Subjective:    Patient ID: Theresa Perez, female    DOB: 08/25/1950, 64 y.o.   MRN: [id number]  HPI The patient is a 64 YO female coming in for wellness. No new concerns. Please see A/P for status and treatment of chronic problems.  PMH, [facility], social history reviewed and updated.   Review of Systems  Constitutional: Negative for fever, activity change, appetite change, fatigue and unexpected weight change.  HENT: Negative.   Eyes: Negative.   Respiratory: Negative for cough, chest tightness, shortness of breath and wheezing.   Cardiovascular: Negative for chest pain, palpitations and leg swelling.  Gastrointestinal: Positive for constipation. Negative for nausea, abdominal pain, diarrhea, blood in stool and abdominal distention.  Musculoskeletal: Negative.   Skin: Negative.   Neurological: Negative.   Psychiatric/Behavioral: Negative.       Objective:   Physical Exam  Constitutional: She is oriented to person, place, and time. She appears well-developed and well-nourished.  HENT:  Head: Normocephalic and atraumatic.  Eyes: EOM are normal.  Neck: Normal range of motion.  Cardiovascular: Normal rate and regular rhythm.   Pulmonary/Chest: Effort normal. No respiratory distress. She has no wheezes. She has no rales.  Abdominal: Soft. Bowel sounds are normal. She exhibits no distension. There is no tenderness. There is no rebound and no guarding.  Musculoskeletal: She exhibits no edema.  Neurological: She is alert and oriented to person, place, and time.  Skin: Skin is warm and dry.  Psychiatric: She has a normal mood and affect.    Filed Vitals:   12/16/14 1005  BP: 128/72  Pulse: 76  Temp: 98.7 F (37.1 C)  TempSrc: Oral  Resp: 14  Height: 5' 10.5" (1.791 m)  Weight: 190 lb (86.183 kg)  SpO2: 97%      Assessment & Plan:

## 2014-12-28 ENCOUNTER — Other Ambulatory Visit: Payer: Self-pay | Admitting: *Deleted

## 2014-12-28 MED ORDER — DULOXETINE HCL 60 MG PO CPEP: 60.0000 mg | ORAL_CAPSULE | Freq: Two times a day (BID) | ORAL | Status: DC

## 2014-12-28 NOTE — Telephone Encounter (Signed)
Left msg on triage stating needing refills on her Duloxetine. Called pt back no naswer LMOM refills sent to [facility].....Johny Chess

## 2015-01-27 ENCOUNTER — Ambulatory Visit (INDEPENDENT_AMBULATORY_CARE_PROVIDER_SITE_OTHER): Payer: BC Managed Care – PPO | Admitting: Internal Medicine

## 2015-01-27 ENCOUNTER — Encounter: Payer: Self-pay | Admitting: Internal Medicine

## 2015-01-27 ENCOUNTER — Other Ambulatory Visit (INDEPENDENT_AMBULATORY_CARE_PROVIDER_SITE_OTHER): Payer: BC Managed Care – PPO

## 2015-01-27 ENCOUNTER — Ambulatory Visit: Payer: BC Managed Care – PPO | Admitting: Internal Medicine

## 2015-01-27 VITALS — BP 136/88 | HR 85 | Temp 98.5°F | Resp 14 | Ht 70.5 in | Wt 191.0 lb

## 2015-01-27 DIAGNOSIS — Z0000 Encounter for general adult medical examination without abnormal findings: Secondary | ICD-10-CM | POA: Diagnosis not present

## 2015-01-27 DIAGNOSIS — Z299 Encounter for prophylactic measures, unspecified: Secondary | ICD-10-CM

## 2015-01-27 DIAGNOSIS — Z7189 Other specified counseling: Secondary | ICD-10-CM

## 2015-01-27 DIAGNOSIS — E039 Hypothyroidism, unspecified: Secondary | ICD-10-CM | POA: Diagnosis not present

## 2015-01-27 DIAGNOSIS — M25562 Pain in left knee: Secondary | ICD-10-CM | POA: Diagnosis not present

## 2015-01-27 DIAGNOSIS — Z7184 Encounter for health counseling related to travel: Secondary | ICD-10-CM

## 2015-01-27 DIAGNOSIS — Z418 Encounter for other procedures for purposes other than remedying health state: Secondary | ICD-10-CM | POA: Diagnosis not present

## 2015-01-27 LAB — T4, FREE: Free T4: 0.6 ng/dL (ref 0.60–1.60)

## 2015-01-27 LAB — TSH: TSH: 4.36 u[IU]/mL (ref 0.35–4.50)

## 2015-01-27 MED ORDER — HEPATITIS A VACCINE 1440 EL U/ML IM SUSP
1.0000 mL | Freq: Once | INTRAMUSCULAR | Status: AC
  Administered 2015-01-27: 1440 [IU] via INTRAMUSCULAR

## 2015-01-27 MED ORDER — DICLOFENAC SODIUM 75 MG PO TBEC: 75.0000 mg | DELAYED_RELEASE_TABLET | Freq: Two times a day (BID) | ORAL | Status: DC

## 2015-01-27 MED ORDER — CIPROFLOXACIN HCL 500 MG PO TABS: 500.0000 mg | ORAL_TABLET | Freq: Two times a day (BID) | ORAL | Status: DC

## 2015-01-27 NOTE — Patient Instructions (Signed)
We have sent in the voltaren pill which is anti-inflammatory. Take 1 pill twice a day for the next 2 weeks, if you are doing better you can stop. You can also use ice on the area and I would wear a brace on the knee while playing tennis.  I have sent in the cipro for the trip. If you get diarrhea on the trip take 1 pill twice a day for 3 days.   We have given you the hepatitis A shot today which will help protect you on your trip.

## 2015-01-27 NOTE — Progress Notes (Signed)
Pre visit review using our clinic review tool, if applicable. No additional management support is needed unless otherwise documented below in the visit note. 

## 2015-01-28 DIAGNOSIS — M25562 Pain in left knee: Secondary | ICD-10-CM | POA: Insufficient documentation

## 2015-01-28 DIAGNOSIS — Z7184 Encounter for health counseling related to travel: Secondary | ICD-10-CM | POA: Insufficient documentation

## 2015-01-28 LAB — HEPATITIS C ANTIBODY: HCV AB: NEGATIVE

## 2015-01-28 NOTE — Assessment & Plan Note (Signed)
Checking thyroid levels today, continue synthroid 88 mcg daily and adjust as needed.

## 2015-01-28 NOTE — Assessment & Plan Note (Signed)
Likely a bursitis and given stretching exercises. Advised to ice and rx for voltaren for several weeks. She may need to take 1 week off tennis and use brace when starting back.

## 2015-01-28 NOTE — Assessment & Plan Note (Signed)
Given hepatitis A today, rx for cipro sent in and talked to her about how to use if needed. Advised to avoid unsafe drinking water and avoid mosquitos with clothing and netting.

## 2015-01-28 NOTE — Progress Notes (Signed)
   Subjective:    Patient ID: Theresa Perez, female    DOB: 1950/03/03, 64 y.o.   MRN: [id number]  HPI The patient is a 64 YO female coming in for several concerns. Pain in her left knee is the first. She was carrying her grandchild up some stairs recently and the pain started after that. Some mild swelling afterwards which has mostly resolved. Painful as the day goes on and better in the morning. Has not tried anything for the pain. 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 She wants to know any precautions that are needed.   Review of Systems  Constitutional: Negative for fever, activity change, appetite change, fatigue and unexpected weight change.  HENT: Negative.   Eyes: Negative.   Respiratory: Negative for cough, chest tightness, shortness of breath and wheezing.   Cardiovascular: Negative for chest pain, palpitations and leg swelling.  Gastrointestinal: Positive for constipation. Negative for nausea, abdominal pain, diarrhea, blood in stool and abdominal distention.  Musculoskeletal: Positive for arthralgias.  Skin: Negative.   Neurological: Negative.   Psychiatric/Behavioral: Negative.       Objective:   Physical Exam  Constitutional: She is oriented to person, place, and time. She appears well-developed and well-nourished.  HENT:  Head: Normocephalic and atraumatic.  Eyes: EOM are normal.  Neck: Normal range of motion.  Cardiovascular: Normal rate and regular rhythm.   Pulmonary/Chest: Effort normal. No respiratory distress. She has no wheezes. She has no rales.  Abdominal: Soft. Bowel sounds are normal. She exhibits no distension. There is no tenderness. There is no rebound and no guarding.  Musculoskeletal: She exhibits no edema.  Pain in the pre-patellar bursa, LCL and MCL without tenderness, ACL and PCL intact. No swelling in the knee.Some fullness behind the knee.   Neurological: She is  alert and oriented to person, place, and time.  Skin: Skin is warm and dry.  Psychiatric: She has a normal mood and affect.   Filed Vitals:   01/27/15 0829  BP: 136/88  Pulse: 85  Temp: 98.5 F (36.9 C)  TempSrc: Oral  Resp: 14  Height: 5' 10.5" (1.791 m)  Weight: 191 lb (86.637 kg)  SpO2: 98%      Assessment & Plan:  Hepatitis A vaccine given at visit.

## 2015-02-14 ENCOUNTER — Telehealth: Payer: Self-pay | Admitting: Internal Medicine

## 2015-02-14 NOTE — Telephone Encounter (Signed)
Pt called in and knee is still swollen, i have made her an appt to see Dr Tamala Julian for 1/4 but she wants to know in the mean time what can she do?     Best number is [phone] Cell number [phone]

## 2015-02-15 NOTE — Telephone Encounter (Signed)
Left message for patient to call me back about her knee pain.

## 2015-02-15 NOTE — Telephone Encounter (Signed)
Can try an OTC knee sleeve to help with the swelling and can ice the knee.

## 2015-02-17 ENCOUNTER — Telehealth: Payer: Self-pay | Admitting: Internal Medicine

## 2015-02-17 NOTE — Telephone Encounter (Signed)
Received a call from patient. She refused to go to the ED, and states that she does not think that it it necessary to be seen in the ED. Advised no availability with dr Sharlet Salina until Tuesday. She states that she will keep her appt with dr Tamala Julian and just go to an urgent care if she needs to see someone.

## 2015-02-17 NOTE — Telephone Encounter (Signed)
Noted thanks °

## 2015-02-17 NOTE — Telephone Encounter (Signed)
PLEASE NOTE: All timestamps contained within this report are represented as [place] Standard Time. CONFIDENTIALTY NOTICE: This fax transmission is intended only for the addressee. It contains information that is legally privileged, confidential or otherwise protected from use or disclosure. If you are not the intended recipient, you are strictly prohibited from reviewing, disclosing, copying using or disseminating any of this information or taking any action in reliance on or regarding this information. If you have received this fax in error, please notify us immediately by telephone so that we can arrange for its return to [place]. Phone: [phone], Toll-Free: [phone], Fax: [phone] Page: 1 of 1 Call Id: [id number] [facility] [place] Patient Name: Theresa Perez DOB: [id number] Initial Comment Caller states she is having issue with swelling and pain in the knee; May have twisted something when caring her granddtr; was given a script and has been taken it for about 10 days; she has swelling in ankles and foot; she wears a break. Nurse Assessment Nurse: Markus Daft, RN, Sherre Poot Date/Time Eilene Ghazi Time): 02/17/2015 10:06:46 AM Confirm and document reason for call. If symptomatic, describe symptoms. ---Caller states she is having issue with swelling and pain in the left knee that is a little less now but persisting. May have twisted something when caring her granddtr and climbing down steps a couple of weeks ago. She saw MD, and was given a script and has been taken it for about 10 days. She is wearing a knee brace, using ice/heat. Has appt with orthopedist on Wednesday of next wk. - She has new swelling in left ankle and foot. Has the patient traveled out of the country within the last 30 days? ---Not Applicable Does the patient have any new or worsening symptoms? ---Yes Will a triage be completed? ---Yes Related  visit to physician within the last 2 weeks? ---Yes Does the PT have any chronic conditions? (i.e. diabetes, asthma, etc.) ---Yes List chronic conditions. ---HTN, hypothyroid s/p thyroid removed, Depression Is this a behavioral health or substance abuse call? ---No Guidelines Guideline Title Affirmed Question Affirmed Notes Leg Swelling and Edema [1] Thigh, calf, or ankle swelling AND [2] only 1 side Final Disposition User See Physician within 4 Hours (or PCP triage) Markus Daft, RN, Windy Comments Caller refused, but then states that she would not be back in area for another 4 hours. She could be back by 3:30 pm. - Spoke with Janace Hoard at office to see about getting her worked in to be seen at [facility].   Warm conf. To Dustin.  There is no available appts.   He Advised that he go to ER.  Disagree/Comply: Comply

## 2015-02-22 ENCOUNTER — Encounter: Payer: Self-pay | Admitting: Family Medicine

## 2015-02-22 ENCOUNTER — Other Ambulatory Visit (INDEPENDENT_AMBULATORY_CARE_PROVIDER_SITE_OTHER): Payer: BC Managed Care – PPO

## 2015-02-22 ENCOUNTER — Ambulatory Visit (INDEPENDENT_AMBULATORY_CARE_PROVIDER_SITE_OTHER): Payer: BC Managed Care – PPO | Admitting: Family Medicine

## 2015-02-22 VITALS — BP 114/78 | HR 71 | Ht 70.0 in | Wt 194.0 lb

## 2015-02-22 DIAGNOSIS — S76319A Strain of muscle, fascia and tendon of the posterior muscle group at thigh level, unspecified thigh, initial encounter: Secondary | ICD-10-CM | POA: Diagnosis not present

## 2015-02-22 DIAGNOSIS — M25562 Pain in left knee: Secondary | ICD-10-CM | POA: Diagnosis not present

## 2015-02-22 DIAGNOSIS — M25462 Effusion, left knee: Secondary | ICD-10-CM

## 2015-02-22 DIAGNOSIS — M129 Arthropathy, unspecified: Secondary | ICD-10-CM

## 2015-02-22 DIAGNOSIS — M1712 Unilateral primary osteoarthritis, left knee: Secondary | ICD-10-CM | POA: Insufficient documentation

## 2015-02-22 NOTE — Progress Notes (Addendum)
[facility] [address] [place], [place] [address] Phone: [phone] Subjective:    I'm seeing this patient by the request  of:  Hoyt Koch, MD   CC: Left knee pain  [id number] Theresa Perez is a 65 y.o. female coming in with complaint of left knee pain. Patient states that she was doing a lot of stairs at the [facility] and had significant amount of pain and swelling on the medial aspect of the left knee. States that there is a couple days where she had difficulty with ambulation. Seem to be getting better slowly over the course of time. Patient still states that she makes certain movements such as extending her leg she can have a severe pain on the medial aspect. Denies any locking or giving out on her. Patient has not tried any significant home modalities at this time except for some oral anti-inflammatories which she has had. Patient is even been able to play tennis recently. Still rates the severity of pain though a 7 out of 10. Patient's denies any other injury. Has not had any significant history of knee pain previously.     Past Medical History  Diagnosis Date  . Hypertension   . Hypothyroidism   . Allergy   . Anxiety   . Cyst of breast, right, diffuse fibrocystic   . Cancer ([place])     Thyroid-Papillary  . Complication of anesthesia   . PONV (postoperative nausea and vomiting)    Past Surgical History  Procedure Laterality Date  . Aspiration cyst right breast    . Uterine ablation    . Thyroidectomy  1997  . Tonsillectomy  1969  . Vaginal hysterectomy N/A 03/31/2012    Procedure: HYSTERECTOMY VAGINAL;  Surgeon: Gus Height, MD;  Location: [facility];  Service: Gynecology;  Laterality: N/A;  . Anterior and posterior repair N/A 03/31/2012    Procedure: ANTERIOR (CYSTOCELE) AND POSTERIOR REPAIR (RECTOCELE);  Surgeon: Gus Height, MD;  Location: [facility];  Service: Gynecology;  Laterality: N/A;   Social History   Social History  .  Marital Status: Married    Spouse Name: N/A  . Number of Children: N/A  . Years of Education: N/A   Social History Main Topics  . Smoking status: Current Every Day Smoker -- 0.30 packs/day for 15 years    Types: Cigarettes  . Smokeless tobacco: Never Used     Comment: Occassional smoker  . Alcohol Use: Yes     Comment: Rarely  . Drug Use: No  . Sexual Activity: Yes    Birth Control/ Protection: Post-menopausal   Other Topics Concern  . None   Social History Narrative   No Known Allergies Family History  Problem Relation Age of Onset  . Diabetes Mother   . Deep vein thrombosis Mother     Varicose Veins  . Cancer Father     Father died from brain tumor.  [name] Heart disease Father   . Colon cancer Neg Hx   . Stomach cancer Neg Hx     Past medical history, social, surgical and family history all reviewed in electronic medical record.  No pertanent information unless stated regarding to the chief complaint.   Review of Systems: No headache, visual changes, nausea, vomiting, diarrhea, constipation, dizziness, abdominal pain, skin rash, fevers, chills, night sweats, weight loss, swollen lymph nodes, body aches, joint swelling, muscle aches, chest pain, shortness of breath, mood changes.   Objective Blood pressure 114/78, pulse 71, height  5\' 10"  (1.778 m), weight 194 lb (87.998 kg), SpO2 95 %.  General: No apparent distress alert and oriented x3 mood and affect normal, dressed appropriately.  HEENT: Pupils equal, extraocular movements intact  Respiratory: Patient's speak in full sentences and does not appear short of breath  Cardiovascular: No lower extremity edema, non tender, no erythema  Skin: Warm dry intact with no signs of infection or rash on extremities or on axial skeleton.  Abdomen: Soft nontender  Neuro: Cranial nerves II through XII are intact, neurovascularly intact in all extremities with 2+ DTRs and 2+ pulses.  Lymph: No lymphadenopathy of posterior or anterior  cervical chain or axillae bilaterally.  Gait normal with good balance and coordination.  MSK:  Non tender with full range of motion and good stability and symmetric strength and tone of shoulders, elbows, wrist, hip, and ankles bilaterally.  Knee: left Mild valgus deformity of the knee Mild discomfort over the medial joint line as well as the pes anserine ROM full in flexion and extension and lower leg rotation. Ligaments with solid consistent endpoints including ACL, PCL, LCL, MCL. Negative Mcmurray's, Apley's, and Thessalonian tests. Non painful patellar compression. Severe tenderness to palpation over the pes anserine area Patellar glide without crepitus. Patellar and quadriceps tendons unremarkable. 4-5 strength of the hamstring compared to the contralateral side Contralateral knee also has some mild valgus deformity but nontender on exam. With full strength and neurovascular intact distally.  MSK US performed of: Left knee pain This study was ordered, performed, and interpreted by Charlann Boxer D.O.  Knee: All structures visualized. Small effusion noted Moderate narrowing of the medial joint line with mild degenerative changes of the meniscus Patellar Tendon unremarkable on long and transverse views without effusion. No abnormality of prepatellar bursa. Patient though does have some tearing of the distal hamstring at its insertion over the medial tibial region. Hypoechoic changes around this area that likely contributed to a bursitis. Increasing Doppler flow noted. LCL and MCL unremarkable on long and transverse views. No abnormality of origin of medial or lateral head of the gastrocnemius.  IMPRESSION:  Distal tear patient's hamstring tendon and insertion as well as hypoechoic changes consistent with a pes anserine bursitis. Moderate narrowing of the medial joint space  Procedure note 97110; 15 minutes spent for Therapeutic exercises as stated in above notes.  This included exercises  focusing on stretching, strengthening, with significant focus on eccentric aspects. Flexion and extension exercises including nordic and askling exercises given.    Proper technique shown and discussed handout in great detail with ATC.  All questions were discussed and answered.     Impression and Recommendations:     This case required medical decision making of moderate complexity.      Note: This dictation was prepared with Dragon dictation along with smaller phrase technology. Any transcriptional errors that result from this process are unintentional.

## 2015-02-22 NOTE — Assessment & Plan Note (Signed)
The patient's pain seems to be more over the pain is answering area in the insertion of the hamstring. Patient's ultrasound does have increasing Doppler flow and does appear to have significant tearing and some mild scar tissue formation. Patient has elected try conservative therapy. Work with Product/process development scientist today. Discussed icing regimen and home exercises. We discussed topical anti-inflammatories. Patient does have some prescription medications that could be beneficial. Patient will avoid certain activities. Patient will come back and see me again in 4 weeks. If continuing have pain we'll consider injection.

## 2015-02-22 NOTE — Patient Instructions (Signed)
Good to see you.  Ice 20 minutes 2 times daily. Usually after activity and before bed. Exercises 3 times a week.  Thigh compression sleeve at [facility] could help (tommy copper etc) pennsaid pinkie amount topically 2 times daily as needed.  Avoid jumping or extreme activity if it causes pain You do have some knee arthritis that could be contributing as well and if not better in 3-4 weeks we will try an injection and xrays Happy New Year!

## 2015-02-22 NOTE — Assessment & Plan Note (Signed)
Patient does have more of a knee arthritis as well the can be contribute. No Baker cyst noted even though that this could be concerning as well. We discussed icing regimen. We discussed home exercises. We discussed that if any worsening symptoms we may need to consider injection. Did have a small effusion. Patient will come back and see me again in 4 weeks.

## 2015-02-22 NOTE — Progress Notes (Signed)
Pre visit review using our clinic review tool, if applicable. No additional management support is needed unless otherwise documented below in the visit note. 

## 2015-02-28 ENCOUNTER — Telehealth: Payer: Self-pay | Admitting: Internal Medicine

## 2015-02-28 ENCOUNTER — Other Ambulatory Visit: Payer: Self-pay

## 2015-02-28 DIAGNOSIS — N63 Unspecified lump in unspecified breast: Secondary | ICD-10-CM

## 2015-02-28 NOTE — Telephone Encounter (Signed)
Place order.

## 2015-02-28 NOTE — Telephone Encounter (Signed)
Pt stated that she saw Dr. Sharlet Salina concern about lump in her breast area, pt normally get her mammogram done at [facility] but they told her she need to have this done while a doctor is there this time due to the lump. Pt state they told her to get our office to call and order this mammogram with the doctor being there. Please help, any question please call her  Phone # [phone]

## 2015-03-01 NOTE — Addendum Note (Signed)
Addended by: Lyndal Pulley on: 03/01/2015 04:04 PM   Modules accepted: Miquel Dunn

## 2015-03-06 ENCOUNTER — Other Ambulatory Visit: Payer: Self-pay | Admitting: Internal Medicine

## 2015-03-08 ENCOUNTER — Encounter: Payer: Self-pay | Admitting: Internal Medicine

## 2015-03-09 ENCOUNTER — Other Ambulatory Visit: Payer: Self-pay | Admitting: Internal Medicine

## 2015-03-09 DIAGNOSIS — N63 Unspecified lump in unspecified breast: Secondary | ICD-10-CM

## 2015-03-15 ENCOUNTER — Ambulatory Visit: Payer: BC Managed Care – PPO | Admitting: Family Medicine

## 2015-03-17 ENCOUNTER — Other Ambulatory Visit (HOSPITAL_COMMUNITY)
Admission: RE | Admit: 2015-03-17 | Discharge: 2015-03-17 | Disposition: A | Discharge status: Home / Self Care | Payer: BC Managed Care – PPO | Source: Ambulatory Visit | Attending: Diagnostic Radiology | Admitting: Diagnostic Radiology

## 2015-03-17 ENCOUNTER — Ambulatory Visit
Admission: RE | Admit: 2015-03-17 | Discharge: 2015-03-17 | Disposition: A | Discharge status: Home / Self Care | Payer: BC Managed Care – PPO | Source: Ambulatory Visit | Attending: Internal Medicine | Admitting: Internal Medicine

## 2015-03-17 ENCOUNTER — Other Ambulatory Visit: Payer: Self-pay | Admitting: Internal Medicine

## 2015-03-17 DIAGNOSIS — N63 Unspecified lump in unspecified breast: Secondary | ICD-10-CM

## 2015-03-17 DIAGNOSIS — N6001 Solitary cyst of right breast: Secondary | ICD-10-CM | POA: Diagnosis not present

## 2015-03-22 ENCOUNTER — Ambulatory Visit (INDEPENDENT_AMBULATORY_CARE_PROVIDER_SITE_OTHER): Payer: BC Managed Care – PPO | Admitting: Nurse Practitioner

## 2015-03-22 ENCOUNTER — Encounter: Payer: Self-pay | Admitting: Nurse Practitioner

## 2015-03-22 VITALS — BP 122/78 | HR 70 | Temp 98.0°F | Ht 70.0 in | Wt 190.0 lb

## 2015-03-22 DIAGNOSIS — B9789 Other viral agents as the cause of diseases classified elsewhere: Principal | ICD-10-CM

## 2015-03-22 DIAGNOSIS — J069 Acute upper respiratory infection, unspecified: Secondary | ICD-10-CM | POA: Diagnosis not present

## 2015-03-22 MED ORDER — AZITHROMYCIN 250 MG PO TABS: ORAL_TABLET | ORAL | Status: DC

## 2015-03-22 NOTE — Progress Notes (Signed)
Patient ID: Theresa Perez, female    DOB: 05-25-1950  Age: 65 y.o. MRN: [id number]  CC: Sore Throat   HPI Theresa Perez presents for CC of ST x 3-4 days.   1) Yesterday was not improved  Congestion, glands swollen, ears felt full  Chest- coughing   Treatment to date: Rest, dayquil and nyquil   Sick contacts: Son   History of CAP  History Theresa Perez has a past medical history of Hypertension; Hypothyroidism; Allergy; Anxiety; Cyst of breast, right, diffuse fibrocystic; Cancer ([place]); Complication of anesthesia; and PONV (postoperative nausea and vomiting).   She has past surgical history that includes aspiration cyst right breast; uterine ablation; Thyroidectomy (1997); Tonsillectomy (1969); Vaginal hysterectomy (N/A, 03/31/2012); and Anterior and posterior repair (N/A, 03/31/2012).   Her family history includes Cancer in her father; Deep vein thrombosis in her mother; Diabetes in her mother; Heart disease in her father. There is no history of Colon cancer or Stomach cancer.She reports that she has been smoking Cigarettes.  She has a 4.5 pack-year smoking history. She has never used smokeless tobacco. She reports that she drinks alcohol. She reports that she does not use illicit drugs.  Outpatient Prescriptions Prior to Visit  Medication Sig Dispense Refill  . Ascorbic Acid (VITAMIN C PO) Take 1 tablet by mouth daily.    [name] atenolol-chlorthalidone (TENORETIC) 50-25 MG tablet Take 0.5 tablets by mouth daily. 45 tablet 3  . Black Cohosh 540 MG CAPS Take 540 mg by mouth 2 (two) times daily.     . butalbital-acetaminophen-caffeine (ESGIC) 50-325-40 MG per tablet Take 1 tablet by mouth 2 (two) times daily as needed for headache. 30 tablet 1  . CALCIUM PO Take 1 tablet by mouth daily.    . ciprofloxacin (CIPRO) 500 MG tablet Take 1 tablet (500 mg total) by mouth 2 (two) times daily. 6 tablet 0  . COENZYME Q-10 PO Take 1 tablet by mouth daily.    . Cyanocobalamin (VITAMIN B-12 PO) Take 1 tablet  by mouth daily.    . DULoxetine (CYMBALTA) 60 MG capsule Take 1 capsule (60 mg total) by mouth 2 (two) times daily. 60 capsule 5  . ECHINACEA PO Take 1 tablet by mouth daily.    [name] EVENING PRIMROSE OIL PO Take 1 tablet by mouth daily.    . ferrous sulfate 325 (65 FE) MG tablet Take 325 mg by mouth every evening.     . fluticasone (FLONASE) 50 MCG/ACT nasal spray USE TWO SPRAY(S) IN EACH NOSTRIL ONCE DAILY 16 g 3  . Ginkgo Biloba (GNP GINGKO BILOBA EXTRACT PO) Take 1 tablet by mouth daily.    [name] levothyroxine (SYNTHROID, LEVOTHROID) 88 MCG tablet TAKE ONE TABLET BY MOUTH ONCE DAILY 45 tablet 2  . Misc Natural Products (GLUCOSAMINE CHOND COMPLEX/MSM) TABS Take 1 tablet by mouth 2 (two) times daily.    . Multiple Vitamins-Minerals (MULTIVITAMIN WITH MINERALS) tablet Take 1 tablet by mouth daily.    . Nutritional Supplements (DHEA PO) Take 1 tablet by mouth daily.    . diclofenac (VOLTAREN) 75 MG EC tablet TAKE ONE TABLET BY MOUTH TWICE DAILY 60 tablet 0   No facility-administered medications prior to visit.    ROS Review of Systems  Constitutional: Negative for fever, chills, diaphoresis and fatigue.  HENT: Positive for congestion, postnasal drip, rhinorrhea, sinus pressure and sore throat. Negative for sneezing.   Respiratory: Negative for chest tightness, shortness of breath and wheezing.   Cardiovascular: Negative for chest pain, palpitations and leg  swelling.  Gastrointestinal: Negative for nausea, vomiting and diarrhea.  Skin: Negative for rash.  Neurological: Negative for dizziness and headaches.    Objective:  BP 122/78 mmHg  Pulse 70  Temp(Src) 98 F (36.7 C) (Oral)  Ht 5\' 10"  (1.778 m)  Wt 190 lb (86.183 kg)  BMI 27.26 kg/m2  SpO2 97%  Physical Exam  Constitutional: She is oriented to person, place, and time. She appears well-developed and well-nourished. No distress.  HENT:  Head: Normocephalic and atraumatic.  Right Ear: External ear normal.  Left Ear: External ear  normal.  Mouth/Throat: Oropharynx is clear and moist. No oropharyngeal exudate.  Tm's clear bilaterally  Eyes: Conjunctivae and EOM are normal. Pupils are equal, round, and reactive to light. Right eye exhibits no discharge. Left eye exhibits no discharge. No scleral icterus.  Neck: Normal range of motion. Neck supple.  Cardiovascular: Normal rate, regular rhythm and normal heart sounds.  Exam reveals no gallop and no friction rub.   No murmur heard. Pulmonary/Chest: Effort normal and breath sounds normal. No respiratory distress. She has no wheezes. She has no rales. She exhibits no tenderness.  Lymphadenopathy:    She has no cervical adenopathy.  Neurological: She is alert and oriented to person, place, and time. No cranial nerve deficit. She exhibits normal muscle tone. Coordination normal.  Skin: Skin is warm and dry. No rash noted. She is not diaphoretic.  Psychiatric: She has a normal mood and affect. Her behavior is normal. Judgment and thought content normal.   Assessment & Plan:   Briara was seen today for sore throat.  Diagnoses and all orders for this visit:  Viral URI with cough  Other orders -     azithromycin (ZITHROMAX) 250 MG tablet; Take 2 tablets by mouth on day 1, take 1 tablet by mouth each day after for 4 days.  I have discontinued Ms. Rhoton diclofenac. I am also having her start on azithromycin. Additionally, I am having her maintain her CALCIUM PO, Black Cohosh, GLUCOSAMINE CHOND COMPLEX/MSM, Ginkgo Biloba (GNP GINGKO BILOBA EXTRACT PO), ECHINACEA PO, multivitamin with minerals, Cyanocobalamin (VITAMIN B-12 PO), Nutritional Supplements (DHEA PO), ferrous sulfate, EVENING PRIMROSE OIL PO, COENZYME Q-10 PO, Ascorbic Acid (VITAMIN C PO), butalbital-acetaminophen-caffeine, levothyroxine, fluticasone, atenolol-chlorthalidone, DULoxetine, and ciprofloxacin.  Meds ordered this encounter  Medications  . azithromycin (ZITHROMAX) 250 MG tablet    Sig: Take 2 tablets by  mouth on day 1, take 1 tablet by mouth each day after for 4 days.    Dispense:  6 each    Refill:  0    Order Specific Question:  Supervising Provider    Answer:  Crecencio Mc [[id number]]     Follow-up: Return if symptoms worsen or fail to improve.

## 2015-03-22 NOTE — Assessment & Plan Note (Signed)
New onset Will treat conservatively due to probable viral nature Antibiotic prescription printed to pharmacy in case of no improvement or fever within 48 hours. Pt verbalized understanding of need to hold.  FU prn worsening/failure to improve.

## 2015-03-22 NOTE — Progress Notes (Signed)
Pre visit review using our clinic review tool, if applicable. No additional management support is needed unless otherwise documented below in the visit note. 

## 2015-03-22 NOTE — Patient Instructions (Signed)
Wait 3-4 days. If fever of 100.5 or greater, green mucous from chest, or worsening cough- take the z-pack.

## 2015-04-16 ENCOUNTER — Other Ambulatory Visit: Payer: Self-pay | Admitting: Internal Medicine

## 2015-04-28 ENCOUNTER — Encounter: Payer: Self-pay | Admitting: Family

## 2015-04-28 ENCOUNTER — Ambulatory Visit (INDEPENDENT_AMBULATORY_CARE_PROVIDER_SITE_OTHER): Payer: PRIVATE HEALTH INSURANCE | Admitting: Family

## 2015-04-28 VITALS — BP 138/82 | HR 77 | Temp 98.3°F | Ht 70.0 in | Wt 186.0 lb

## 2015-04-28 DIAGNOSIS — S301XXA Contusion of abdominal wall, initial encounter: Secondary | ICD-10-CM | POA: Insufficient documentation

## 2015-04-28 DIAGNOSIS — S3011XA Contusion of abdominal wall, initial encounter: Secondary | ICD-10-CM | POA: Insufficient documentation
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
## 2015-04-28 NOTE — Patient Instructions (Signed)
Thank you for choosing [facility].  Summary/Instructions:  If your symptoms worsen or fail to improve, please contact our office for further instruction, or in case of emergency go directly to the emergency room at the closest medical facility.   Please continue to use heat and ice as needed for discomfort.  Ibuprofen or Aleve as needed for inflammation.   Contusion A contusion is a deep bruise. Contusions are the result of a blunt injury to tissues and muscle fibers under the skin. The injury causes bleeding under the skin. The skin overlying the contusion may turn blue, purple, or yellow. Minor injuries will give you a painless contusion, but more severe contusions may stay painful and swollen for a few weeks.  CAUSES  This condition is usually caused by a blow, trauma, or direct force to an area of the body. SYMPTOMS  Symptoms of this condition include:  Swelling of the injured area.  Pain and tenderness in the injured area.  Discoloration. The area may have redness and then turn blue, purple, or yellow. DIAGNOSIS  This condition is diagnosed based on a physical exam and medical history. An X-ray, CT scan, or MRI may be needed to determine if there are any associated injuries, such as broken bones (fractures). TREATMENT  Specific treatment for this condition depends on what area of the body was injured. In general, the best treatment for a contusion is resting, icing, applying pressure to (compression), and elevating the injured area. This is often called the RICE strategy. Over-the-counter anti-inflammatory medicines may also be recommended for pain control.  HOME CARE INSTRUCTIONS   Rest the injured area.  If directed, apply ice to the injured area:  Put ice in a plastic bag.  Place a towel between your skin and the bag.  Leave the ice on for 20 minutes, 2-3 times per day.  If directed, apply light compression to the injured area using an elastic bandage. Make sure  the bandage is not wrapped too tightly. Remove and reapply the bandage as directed by your health care provider.  If possible, raise (elevate) the injured area above the level of your heart while you are sitting or lying down.  Take over-the-counter and prescription medicines only as told by your health care provider. SEEK MEDICAL CARE IF:  Your symptoms do not improve after several days of treatment.  Your symptoms get worse.  You have difficulty moving the injured area. SEEK IMMEDIATE MEDICAL CARE IF:   You have severe pain.  You have numbness in a hand or foot.  Your hand or foot turns pale or cold.   This information is not intended to replace advice given to you by your health care provider. Make sure you discuss any questions you have with your health care provider.   Document Released: 11/14/2004 Document Revised: 10/26/2014 Document Reviewed: 06/22/2014 Elsevier Interactive Patient Education [employer].
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
## 2015-04-28 NOTE — Progress Notes (Signed)
Pre visit review using our clinic review tool, if applicable. No additional management support is needed unless otherwise documented below in the visit note. 

## 2015-05-02 ENCOUNTER — Other Ambulatory Visit: Payer: Self-pay | Admitting: Endocrinology

## 2015-05-12 ENCOUNTER — Telehealth: Payer: Self-pay | Admitting: Internal Medicine

## 2015-05-12 DIAGNOSIS — E039 Hypothyroidism, unspecified: Secondary | ICD-10-CM

## 2015-05-12 NOTE — Telephone Encounter (Signed)
Labs placed.

## 2015-05-12 NOTE — Telephone Encounter (Signed)
Pt is concerned her thyroid may be off again. She says she has a slight kinda jittery feeling.  Her next appt is late April and she feels this may be too far away for her next labs and is requesting to get her lab work done to be sure her thyroid is still normal Can she just come in for labs with out having to come in before her next appt

## 2015-05-12 NOTE — Telephone Encounter (Signed)
Patient aware and will go have drawn one day next week.

## 2015-05-15 ENCOUNTER — Other Ambulatory Visit (INDEPENDENT_AMBULATORY_CARE_PROVIDER_SITE_OTHER): Payer: BC Managed Care – PPO

## 2015-05-15 DIAGNOSIS — E039 Hypothyroidism, unspecified: Secondary | ICD-10-CM

## 2015-05-15 LAB — T4, FREE: Free T4: 0.68 ng/dL (ref 0.60–1.60)

## 2015-05-15 LAB — TSH: TSH: 4.07 u[IU]/mL (ref 0.35–4.50)

## 2015-05-16 ENCOUNTER — Encounter: Payer: Self-pay | Admitting: Internal Medicine

## 2015-05-17 NOTE — Telephone Encounter (Signed)
Can you see if you can release the results? They should already to released.

## 2015-05-17 NOTE — Telephone Encounter (Signed)
The results are released and the patient viewed them on 3/27 at 7pm.

## 2015-05-18 MED ORDER — LEVOTHYROXINE SODIUM 100 MCG PO TABS: 100.0000 ug | ORAL_TABLET | Freq: Every day | ORAL | Status: DC

## 2015-05-18 NOTE — Addendum Note (Signed)
Addended by: Pricilla Holm A on: 05/18/2015 03:32 PM   Modules accepted: Orders

## 2015-06-02 ENCOUNTER — Other Ambulatory Visit: Payer: Self-pay | Admitting: Internal Medicine

## 2015-06-09 ENCOUNTER — Encounter: Payer: Self-pay | Admitting: Family

## 2015-06-09 ENCOUNTER — Ambulatory Visit (INDEPENDENT_AMBULATORY_CARE_PROVIDER_SITE_OTHER): Payer: BC Managed Care – PPO | Admitting: Family

## 2015-06-09 VITALS — BP 164/100 | HR 87 | Temp 98.3°F | Resp 16 | Ht 70.0 in | Wt 187.1 lb

## 2015-06-09 DIAGNOSIS — Z207 Contact with and (suspected) exposure to pediculosis, acariasis and other infestations: Secondary | ICD-10-CM | POA: Insufficient documentation

## 2015-06-09 DIAGNOSIS — J069 Acute upper respiratory infection, unspecified: Secondary | ICD-10-CM | POA: Diagnosis not present

## 2015-06-09 DIAGNOSIS — Z2089 Contact with and (suspected) exposure to other communicable diseases: Secondary | ICD-10-CM

## 2015-06-09 MED ORDER — PERMETHRIN 5 % EX CREA
1.0000 | TOPICAL_CREAM | Freq: Once | CUTANEOUS | Status: DC
Start: 2015-06-09 — End: 2015-06-16

## 2015-06-09 MED ORDER — FLUTICASONE PROPIONATE 50 MCG/ACT NA SUSP: NASAL | Status: DC

## 2015-06-09 NOTE — Assessment & Plan Note (Signed)
Symptoms and exam consistent with acute upper respiratory infection most likely viral and improving. Continue conservative treatment with over-the-counter medications as needed for symptom relief and supportive care. Follow-up if symptoms worsen or do not improve.

## 2015-06-09 NOTE — Progress Notes (Signed)
Pre visit review using our clinic review tool, if applicable. No additional management support is needed unless otherwise documented below in the visit note. 

## 2015-06-09 NOTE — Patient Instructions (Signed)
Thank you for choosing [facility].  Summary/Instructions:  Your prescription(s) have been submitted to your pharmacy or been printed and provided for you. Please take as directed and contact our office if you believe you are having problem(s) with the medication(s) or have any questions.  If your symptoms worsen or fail to improve, please contact our office for further instruction, or in case of emergency go directly to the emergency room at the closest medical facility.   General Recommendations:    Please drink plenty of fluids.  Get plenty of rest   Sleep in humidified air  Use saline nasal sprays  Netti pot   OTC Medications:  Decongestants - helps relieve congestion   Flonase (generic fluticasone) or Nasacort (generic triamcinolone) - please make sure to use the "cross-over" technique at a 45 degree angle towards the opposite eye as opposed to straight up the nasal passageway.   Sudafed (generic pseudoephedrine - Note this is the one that is available behind the pharmacy counter); Products with phenylephrine (-PE) may also be used but is often not as effective as pseudoephedrine.   If you have HIGH BLOOD PRESSURE - Coricidin HBP; AVOID any product that is -D as this contains pseudoephedrine which may increase your blood pressure.  Afrin (oxymetazoline) every 6-8 hours for up to 3 days.   Allergies - helps relieve runny nose, itchy eyes and sneezing   Claritin (generic loratidine), Allegra (fexofenidine), or Zyrtec (generic cyrterizine) for runny nose. These medications should not cause drowsiness.  Note - Benadryl (generic diphenhydramine) may be used however may cause drowsiness  Cough -   Delsym or Robitussin (generic dextromethorphan)  Expectorants - helps loosen mucus to ease removal   Mucinex (generic guaifenesin) as directed on the package.  Headaches / General Aches   Tylenol (generic acetaminophen) - DO NOT EXCEED 3 grams (3,000 mg) in a 24  hour time period  Advil/Motrin (generic ibuprofen)   Sore Throat -   Salt water gargle   Chloraseptic (generic benzocaine) spray or lozenges / Sucrets (generic dyclonine)    Scabies, Adult Scabies is a skin condition that happens when very small insects get under the skin (infestation). This causes a rash and severe itchiness. Scabies can spread from person to person (is contagious). If you get scabies, it is common for others in your household to get scabies too. With proper treatment, symptoms usually go away in 2-4 weeks. Scabies usually does not cause lasting problems. CAUSES This condition is caused by mites (Sarcoptes scabiei, or human itch mites) that can only be seen with a microscope. The mites get into the top layer of skin and lay eggs. Scabies can spread from person to person through:  Close contact with a person who has scabies.  Contact with infested items, such as towels, bedding, or clothing. RISK FACTORS This condition is more likely to develop in:  People who live in nursing homes and other extended-care facilities.  People who have sexual contact with a partner who has scabies.  Young children who attend child care facilities.  People who care for others who are at increased risk for scabies. SYMPTOMS Symptoms of this condition may include:  Severe itchiness. This is often worse at night.  A rash that includes tiny red bumps or blisters. The rash commonly occurs on the wrist, elbow, armpit, fingers, waist, groin, or buttocks. Bumps may form a line (burrow) in some areas.  Skin irritation. This can include scaly patches or sores. DIAGNOSIS This condition is diagnosed  with a physical exam. Your health care provider will look closely at your skin. In some cases, your health care provider may take a sample of your affected skin (skin scraping) and have it examined under a microscope. TREATMENT This condition may be treated with:  Medicated cream or lotion  that kills the mites. This is spread on the entire body and left on for several hours. Usually, one treatment with medicated cream or lotion is enough to kill all of the mites. In severe cases, the treatment may be repeated.  Medicated cream that relieves itching.  Medicines that help to relieve itching.  Medicines that kill the mites. This treatment is rarely used. HOME CARE INSTRUCTIONS Medicines  Take or apply over-the-counter and prescription medicines as told by your health care provider.  Apply medicated cream or lotion as told by your health care provider.  Do not wash off the medicated cream or lotion until the necessary amount of time has passed. Skin Care  Avoid scratching your affected skin.  Keep your fingernails closely trimmed to reduce injury from scratching.  Take cool baths or apply cool washcloths to help reduce itching. General Instructions  Clean all items that you recently had contact with, including bedding, clothing, and furniture. Do this on the same day that your treatment starts.  Use hot water when you wash items.  Place unwashable items into closed, airtight plastic bags for at least 3 days. The mites cannot live for more than 3 days away from human skin.  Vacuum furniture and mattresses that you use.  Make sure that other people who may have been infested are examined by a health care provider. These include members of your household and anyone who may have had contact with infested items.  Keep all follow-up visits as told by your health care provider. This is important. SEEK MEDICAL CARE IF:  You have itching that does not go away after 4 weeks of treatment.  You continue to develop new bumps or burrows.  You have redness, swelling, or pain in your rash area after treatment.  You have fluid, blood, or pus coming from your rash.   This information is not intended to replace advice given to you by your health care provider. Make sure you  discuss any questions you have with your health care provider.   Document Released: 10/26/2014 Document Reviewed: 09/06/2014 Elsevier Interactive Patient Education [employer].

## 2015-06-09 NOTE — Progress Notes (Signed)
Subjective:    Patient ID: Theresa Perez, female    DOB: September 23, 1950, 65 y.o.   MRN: [id number]  No chief complaint on file.   HPI:  Theresa Perez is a 65 y.o. female who  has a past medical history of Hypertension; Hypothyroidism; Allergy; Anxiety; Cyst of breast, right, diffuse fibrocystic; Cancer ([place]); Complication of anesthesia; and PONV (postoperative nausea and vomiting). and presents today for an acute office visit.   1.) Sinuses - this is a new problem. Associated symptom of sore throat, cough, congestion, ear pain, and subjective fever hemming going on for approximately 2 days. Modifying factors include Flonase and over-the-counter allergy medication which seemed with helps significantly. Overall the course has improved since initial onset. No recent antibiotic use.  2.) Scabies exposure - This is a new problem. Associated symptom of exposure to scabies from her great granddaughter occurred approximately one month ago when a rash was noted on her great granddaughter who is diagnosed and treated for scabies. Describes 2 small areas on her left thigh that his chin concern for scabies. Denies any other areas of itching, redness, or rash.   No Known Allergies   Current Outpatient Prescriptions on File Prior to Visit  Medication Sig Dispense Refill  . Ascorbic Acid (VITAMIN C PO) Take 1 tablet by mouth daily.    [name] atenolol-chlorthalidone (TENORETIC) 50-25 MG tablet Take 0.5 tablets by mouth daily. 45 tablet 3  . Black Cohosh 540 MG CAPS Take 540 mg by mouth 2 (two) times daily.     . butalbital-acetaminophen-caffeine (ESGIC) 50-325-40 MG per tablet Take 1 tablet by mouth 2 (two) times daily as needed for headache. 30 tablet 1  . CALCIUM PO Take 1 tablet by mouth daily.    [name] COENZYME Q-10 PO Take 1 tablet by mouth daily.    . Cyanocobalamin (VITAMIN B-12 PO) Take 1 tablet by mouth daily.    . DULoxetine (CYMBALTA) 60 MG capsule Take 1 capsule (60 mg total) by mouth 2 (two)  times daily. 60 capsule 5  . ECHINACEA PO Take 1 tablet by mouth daily.    . ferrous sulfate 325 (65 FE) MG tablet Take 325 mg by mouth every evening.     . Ginkgo Biloba (GNP GINGKO BILOBA EXTRACT PO) Take 1 tablet by mouth daily.    [name] levothyroxine (SYNTHROID, LEVOTHROID) 100 MCG tablet Take 1 tablet (100 mcg total) by mouth daily. 90 tablet 3  . Misc Natural Products (GLUCOSAMINE CHOND COMPLEX/MSM) TABS Take 1 tablet by mouth 2 (two) times daily.    . Multiple Vitamins-Minerals (MULTIVITAMIN WITH MINERALS) tablet Take 1 tablet by mouth daily.    . Nutritional Supplements (DHEA PO) Take 1 tablet by mouth daily.     No current facility-administered medications on file prior to visit.     Past Surgical History  Procedure Laterality Date  . Aspiration cyst right breast    . Uterine ablation    . Thyroidectomy  1997  . Tonsillectomy  1969  . Vaginal hysterectomy N/A 03/31/2012    Procedure: HYSTERECTOMY VAGINAL;  Surgeon: Gus Height, MD;  Location: [facility];  Service: Gynecology;  Laterality: N/A;  . Anterior and posterior repair N/A 03/31/2012    Procedure: ANTERIOR (CYSTOCELE) AND POSTERIOR REPAIR (RECTOCELE);  Surgeon: Gus Height, MD;  Location: [facility];  Service: Gynecology;  Laterality: N/A;    Past Medical History  Diagnosis Date  . Hypertension   . Hypothyroidism   . Allergy   . Anxiety   .  Cyst of breast, right, diffuse fibrocystic   . Cancer ([place])     Thyroid-Papillary  . Complication of anesthesia   . PONV (postoperative nausea and vomiting)      Review of Systems  Constitutional: Positive for fever. Negative for chills.  HENT: Positive for congestion, ear pain and sore throat.   Respiratory: Positive for cough.   Skin: Positive for rash.      Objective:    BP 164/100 mmHg  Pulse 87  Temp(Src) 98.3 F (36.8 C) (Oral)  Resp 16  Ht 5\' 10"  (1.778 m)  Wt 187 lb 1.9 oz (84.877 kg)  BMI 26.85 kg/m2  SpO2 96% Nursing note and vital signs reviewed.  Physical Exam   Constitutional: She is oriented to person, place, and time. She appears well-developed and well-nourished. No distress.  HENT:  Right Ear: Hearing, tympanic membrane, external ear and ear canal normal.  Left Ear: Hearing, tympanic membrane, external ear and ear canal normal.  Nose: Nose normal.  Mouth/Throat: Uvula is midline and oropharynx is clear and moist.  Cardiovascular: Normal rate, regular rhythm, normal heart sounds and intact distal pulses.   Pulmonary/Chest: Effort normal and breath sounds normal.  Neurological: She is alert and oriented to person, place, and time.  Skin: Skin is warm and dry.  Single papule noted on the left lateral thigh that appears slightly excoriated from itching. There is no evidence of any burrowing.  Psychiatric: She has a normal mood and affect. Her behavior is normal. Judgment and thought content normal.       Assessment & Plan:   Problem List Items Addressed This Visit      Respiratory   Acute upper respiratory infection - Primary    Symptoms and exam consistent with acute upper respiratory infection most likely viral and improving. Continue conservative treatment with over-the-counter medications as needed for symptom relief and supportive care. Follow-up if symptoms worsen or do not improve.      Relevant Medications   fluticasone (FLONASE) 50 MCG/ACT nasal spray     Other   Scabies exposure    Small papule area located on left thigh unlikely scabies. Will treat with permethrin cream as precautionary. Follow-up if symptoms worsen or fail to improve.      Relevant Medications   permethrin (ELIMITE) 5 % cream       I am having Theresa Perez start on permethrin. I am also having her maintain her CALCIUM PO, Black Cohosh, GLUCOSAMINE CHOND COMPLEX/MSM, Ginkgo Biloba (GNP GINGKO BILOBA EXTRACT PO), ECHINACEA PO, multivitamin with minerals, Cyanocobalamin (VITAMIN B-12 PO), Nutritional Supplements (DHEA PO), ferrous sulfate, COENZYME Q-10 PO,  Ascorbic Acid (VITAMIN C PO), butalbital-acetaminophen-caffeine, atenolol-chlorthalidone, DULoxetine, levothyroxine, and fluticasone.   Meds ordered this encounter  Medications  . permethrin (ELIMITE) 5 % cream    Sig: Apply 1 application topically once.    Dispense:  60 g    Refill:  0    Order Specific Question:  Supervising Provider    Answer:  Pricilla Holm A [id number]  . fluticasone (FLONASE) 50 MCG/ACT nasal spray    Sig: USE TWO SPRAY(S) IN EACH NOSTRIL ONCE DAILY    Dispense:  16 g    Refill:  2    Order Specific Question:  Supervising Provider    Answer:  Pricilla Holm A [id number]     Follow-up: Return if symptoms worsen or fail to improve.  Mauricio Po, FNP

## 2015-06-09 NOTE — Assessment & Plan Note (Signed)
Small papule area located on left thigh unlikely scabies. Will treat with permethrin cream as precautionary. Follow-up if symptoms worsen or fail to improve.

## 2015-06-16 ENCOUNTER — Other Ambulatory Visit (INDEPENDENT_AMBULATORY_CARE_PROVIDER_SITE_OTHER): Payer: BC Managed Care – PPO

## 2015-06-16 ENCOUNTER — Other Ambulatory Visit: Payer: BC Managed Care – PPO

## 2015-06-16 ENCOUNTER — Encounter: Payer: Self-pay | Admitting: Internal Medicine

## 2015-06-16 ENCOUNTER — Ambulatory Visit (INDEPENDENT_AMBULATORY_CARE_PROVIDER_SITE_OTHER): Payer: BC Managed Care – PPO | Admitting: Internal Medicine

## 2015-06-16 VITALS — BP 122/88 | HR 78 | Temp 98.6°F | Resp 18 | Ht 70.0 in | Wt 187.0 lb

## 2015-06-16 DIAGNOSIS — E039 Hypothyroidism, unspecified: Secondary | ICD-10-CM | POA: Diagnosis not present

## 2015-06-16 DIAGNOSIS — I10 Essential (primary) hypertension: Secondary | ICD-10-CM | POA: Diagnosis not present

## 2015-06-16 LAB — COMPREHENSIVE METABOLIC PANEL
ALBUMIN: 4.4 g/dL (ref 3.5–5.2)
ALK PHOS: 56 U/L (ref 39–117)
ALT: 23 U/L (ref 0–35)
AST: 23 U/L (ref 0–37)
BILIRUBIN TOTAL: 0.4 mg/dL (ref 0.2–1.2)
BUN: 14 mg/dL (ref 6–23)
CALCIUM: 9.4 mg/dL (ref 8.4–10.5)
CO2: 33 mEq/L — ABNORMAL HIGH (ref 19–32)
CREATININE: 0.72 mg/dL (ref 0.40–1.20)
Chloride: 101 mEq/L (ref 96–112)
GFR: 86.77 mL/min (ref >60.00)
Glucose, Bld: 96 mg/dL (ref 70–99)
Potassium: 4 mEq/L (ref 3.5–5.1)
Sodium: 139 mEq/L (ref 135–145)
TOTAL PROTEIN: 7.4 g/dL (ref 6.0–8.3)

## 2015-06-16 LAB — TSH: TSH: 1.92 u[IU]/mL (ref 0.35–4.50)

## 2015-06-16 LAB — T4, FREE: Free T4: 0.68 ng/dL (ref 0.60–1.60)

## 2015-06-16 MED ORDER — TRIAMCINOLONE ACETONIDE 0.1 % EX CREA: 1.0000 "application " | TOPICAL_CREAM | Freq: Two times a day (BID) | CUTANEOUS | Status: DC

## 2015-06-16 NOTE — Progress Notes (Signed)
Pre visit review using our clinic review tool, if applicable. No additional management support is needed unless otherwise documented below in the visit note. 

## 2015-06-16 NOTE — Patient Instructions (Signed)
We are checking the labs today.   We have sent in triamcinolone cream for the rash that you can use up to twice a day. Do not use it on the face because it is too strong for your face.

## 2015-06-17 NOTE — Assessment & Plan Note (Signed)
BP at goal on recheck and was likely elevated last visit due to cold medicine. Atenolol/chlorthalidone and no adjustment needed.

## 2015-06-17 NOTE — Assessment & Plan Note (Signed)
Checking TSH and free T4, recent dose change to 100 mcg synthroid and may need further adjustment.

## 2015-06-17 NOTE — Progress Notes (Signed)
   Subjective:    Patient ID: Theresa Perez, female    DOB: 12-09-1950, 65 y.o.   MRN: [id number]  HPI The patient is a 65 YO female coming in for follow up of her thyroid. She recently had a cold and is still coughing a little bit. Not taking cold medicine anymore but was about 1 week ago. No fevers or chills and mild nasal drainage. No constipation, diarrhea, tremors. No cold or heat intolerance. No other new concerns.   Review of Systems  Constitutional: Negative for fever, activity change, appetite change, fatigue and unexpected weight change.  HENT: Positive for postnasal drip. Negative for congestion, rhinorrhea, sinus pressure and trouble swallowing.   Respiratory: Positive for cough. Negative for chest tightness, shortness of breath and wheezing.   Cardiovascular: Negative for chest pain, palpitations and leg swelling.  Gastrointestinal: Negative for nausea, abdominal pain, diarrhea, blood in stool and abdominal distention.  Skin: Negative.   Neurological: Negative.   Psychiatric/Behavioral: Negative.       Objective:   Physical Exam  Constitutional: She is oriented to person, place, and time. She appears well-developed and well-nourished.  HENT:  Head: Normocephalic and atraumatic.  Eyes: EOM are normal.  Neck: Normal range of motion.  Cardiovascular: Normal rate and regular rhythm.   Pulmonary/Chest: Effort normal. No respiratory distress. She has no wheezes. She has no rales.  Abdominal: Soft. Bowel sounds are normal. She exhibits no distension. There is no tenderness. There is no rebound and no guarding.  Musculoskeletal: She exhibits no edema.  Neurological: She is alert and oriented to person, place, and time.  Skin: Skin is warm and dry.  Psychiatric: She has a normal mood and affect.   Filed Vitals:   06/16/15 1019 06/16/15 1103  BP: 170/90 122/88  Pulse: 78   Temp: 98.6 F (37 C)   TempSrc: Oral   Resp: 18   Height: 5\' 10"  (1.778 m)   Weight: 187 lb  (84.823 kg)   SpO2: 96%       Assessment & Plan:

## 2015-06-20 ENCOUNTER — Other Ambulatory Visit: Payer: Self-pay | Admitting: Internal Medicine

## 2015-06-20 DIAGNOSIS — E038 Other specified hypothyroidism: Secondary | ICD-10-CM

## 2015-06-20 MED ORDER — LEVOTHYROXINE SODIUM 112 MCG PO TABS: 112.0000 ug | ORAL_TABLET | Freq: Every day | ORAL | Status: DC

## 2015-06-28 ENCOUNTER — Ambulatory Visit (INDEPENDENT_AMBULATORY_CARE_PROVIDER_SITE_OTHER): Payer: BC Managed Care – PPO | Admitting: Family

## 2015-06-28 ENCOUNTER — Encounter: Payer: Self-pay | Admitting: Family

## 2015-06-28 VITALS — BP 142/86 | HR 86 | Temp 98.3°F | Resp 16 | Ht 70.0 in | Wt 189.0 lb

## 2015-06-28 DIAGNOSIS — R21 Rash and other nonspecific skin eruption: Secondary | ICD-10-CM | POA: Diagnosis not present

## 2015-06-28 MED ORDER — METHYLPREDNISOLONE ACETATE 40 MG/ML IJ SUSP
40.0000 mg | Freq: Once | INTRAMUSCULAR | Status: AC
  Administered 2015-06-28: 40 mg via INTRAMUSCULAR

## 2015-06-28 MED ORDER — TRIAMCINOLONE ACETONIDE 0.1 % EX CREA: 1.0000 "application " | TOPICAL_CREAM | Freq: Two times a day (BID) | CUTANEOUS | Status: DC

## 2015-06-28 MED ORDER — METHYLPREDNISOLONE ACETATE 80 MG/ML IJ SUSP: 40.0000 mg | Freq: Once | INTRAMUSCULAR | Status: DC

## 2015-06-28 NOTE — Progress Notes (Signed)
Pre visit review using our clinic review tool, if applicable. No additional management support is needed unless otherwise documented below in the visit note. 

## 2015-06-28 NOTE — Patient Instructions (Signed)
Suspect contact dermatitis alternately eczema. It does not resolve with this treatment, we can refer you to an allergist. If there is no improvement in your symptoms, or if there is any worsening of symptoms, or if you have any additional concerns, please return for re-evaluation; or, if we are closed, consider going to the Emergency Room for evaluation if symptoms urgent.  Contact Dermatitis Dermatitis is redness, soreness, and swelling (inflammation) of the skin. Contact dermatitis is a reaction to certain substances that touch the skin. There are two types of contact dermatitis:   Irritant contact dermatitis. This type is caused by something that irritates your skin, such as dry hands from washing them too much. This type does not require previous exposure to the substance for a reaction to occur. This type is more common.  Allergic contact dermatitis. This type is caused by a substance that you are allergic to, such as a nickel allergy or poison ivy. This type only occurs if you have been exposed to the substance (allergen) before. Upon a repeat exposure, your body reacts to the substance. This type is less common. CAUSES  Many different substances can cause contact dermatitis. Irritant contact dermatitis is most commonly caused by exposure to:   Makeup.   Soaps.   Detergents.   Bleaches.   Acids.   Metal salts, such as nickel.  Allergic contact dermatitis is most commonly caused by exposure to:   Poisonous plants.   Chemicals.   Jewelry.   Latex.   Medicines.   Preservatives in products, such as clothing.  RISK FACTORS This condition is more likely to develop in:   People who have jobs that expose them to irritants or allergens.  People who have certain medical conditions, such as asthma or eczema.  SYMPTOMS  Symptoms of this condition may occur anywhere on your body where the irritant has touched you or is touched by you. Symptoms include:  Dryness or  flaking.   Redness.   Cracks.   Itching.   Pain or a burning feeling.   Blisters.  Drainage of small amounts of blood or clear fluid from skin cracks. With allergic contact dermatitis, there may also be swelling in areas such as the eyelids, mouth, or genitals.  DIAGNOSIS  This condition is diagnosed with a medical history and physical exam. A patch skin test may be performed to help determine the cause. If the condition is related to your job, you may need to see an occupational medicine specialist. TREATMENT Treatment for this condition includes figuring out what caused the reaction and protecting your skin from further contact. Treatment may also include:   Steroid creams or ointments. Oral steroid medicines may be needed in more severe cases.  Antibiotics or antibacterial ointments, if a skin infection is present.  Antihistamine lotion or an antihistamine taken by mouth to ease itching.  A bandage (dressing). HOME CARE INSTRUCTIONS Skin Care  Moisturize your skin as needed.   Apply cool compresses to the affected areas.  Try taking a bath with:  Epsom salts. Follow the instructions on the packaging. You can get these at your local pharmacy or grocery store.  Baking soda. Pour a small amount into the bath as directed by your health care provider.  Colloidal oatmeal. Follow the instructions on the packaging. You can get this at your local pharmacy or grocery store.  Try applying baking soda paste to your skin. Stir water into baking soda until it reaches a paste-like consistency.  Do not scratch  your skin.  Bathe less frequently, such as every other day.  Bathe in lukewarm water. Avoid using hot water. Medicines  Take or apply over-the-counter and prescription medicines only as told by your health care provider.   If you were prescribed an antibiotic medicine, take or apply your antibiotic as told by your health care provider. Do not stop using the  antibiotic even if your condition starts to improve. General Instructions  Keep all follow-up visits as told by your health care provider. This is important.  Avoid the substance that caused your reaction. If you do not know what caused it, keep a journal to try to track what caused it. Write down:  What you eat.  What cosmetic products you use.  What you drink.  What you wear in the affected area. This includes jewelry.  If you were given a dressing, take care of it as told by your health care provider. This includes when to change and remove it. SEEK MEDICAL CARE IF:   Your condition does not improve with treatment.  Your condition gets worse.  You have signs of infection such as swelling, tenderness, redness, soreness, or warmth in the affected area.  You have a fever.  You have new symptoms. SEEK IMMEDIATE MEDICAL CARE IF:   You have a severe headache, neck pain, or neck stiffness.  You vomit.  You feel very sleepy.  You notice red streaks coming from the affected area.  Your bone or joint underneath the affected area becomes painful after the skin has healed.  The affected area turns darker.  You have difficulty breathing.   This information is not intended to replace advice given to you by your health care provider. Make sure you discuss any questions you have with your health care provider.   Document Released: 02/02/2000 Document Revised: 10/26/2014 Document Reviewed: 06/22/2014 Elsevier Interactive Patient Education 2016 [employer]  Eczema Eczema, also called atopic dermatitis, is a skin disorder that causes inflammation of the skin. It causes a red rash and dry, scaly skin. The skin becomes very itchy. Eczema is generally worse during the cooler winter months and often improves with the warmth of summer. Eczema usually starts showing signs in infancy. Some children outgrow eczema, but it may last through adulthood.  CAUSES  The exact cause of eczema  is not known, but it appears to run in families. People with eczema often have a family history of eczema, allergies, asthma, or hay fever. Eczema is not contagious. Flare-ups of the condition may be caused by:   Contact with something you are sensitive or allergic to.   Stress. SIGNS AND SYMPTOMS  Dry, scaly skin.   Red, itchy rash.   Itchiness. This may occur before the skin rash and may be very intense.  DIAGNOSIS  The diagnosis of eczema is usually made based on symptoms and medical history. TREATMENT  Eczema cannot be cured, but symptoms usually can be controlled with treatment and other strategies. A treatment plan might include:  Controlling the itching and scratching.   Use over-the-counter antihistamines as directed for itching. This is especially useful at night when the itching tends to be worse.   Use over-the-counter steroid creams as directed for itching.   Avoid scratching. Scratching makes the rash and itching worse. It may also result in a skin infection (impetigo) due to a break in the skin caused by scratching.   Keeping the skin well moisturized with creams every day. This will seal in moisture and  help prevent dryness. Lotions that contain alcohol and water should be avoided because they can dry the skin.   Limiting exposure to things that you are sensitive or allergic to (allergens).   Recognizing situations that cause stress.   Developing a plan to manage stress.  HOME CARE INSTRUCTIONS   Only take over-the-counter or prescription medicines as directed by your health care provider.   Do not use anything on the skin without checking with your health care provider.   Keep baths or showers short (5 minutes) in warm (not hot) water. Use mild cleansers for bathing. These should be unscented. You may add nonperfumed bath oil to the bath water. It is best to avoid soap and bubble bath.   Immediately after a bath or shower, when the skin is still  damp, apply a moisturizing ointment to the entire body. This ointment should be a petroleum ointment. This will seal in moisture and help prevent dryness. The thicker the ointment, the better. These should be unscented.   Keep fingernails cut short. Children with eczema may need to wear soft gloves or mittens at night after applying an ointment.   Dress in clothes made of cotton or cotton blends. Dress lightly, because heat increases itching.   A child with eczema should stay away from anyone with fever blisters or cold sores. The virus that causes fever blisters (herpes simplex) can cause a serious skin infection in children with eczema. SEEK MEDICAL CARE IF:   Your itching interferes with sleep.   Your rash gets worse or is not better within 1 week after starting treatment.   You see pus or soft yellow scabs in the rash area.   You have a fever.   You have a rash flare-up after contact with someone who has fever blisters.    This information is not intended to replace advice given to you by your health care provider. Make sure you discuss any questions you have with your health care provider.   Document Released: 02/02/2000 Document Revised: 11/25/2012 Document Reviewed: 09/07/2012 Elsevier Interactive Patient Education [employer].

## 2015-06-28 NOTE — Progress Notes (Signed)
Subjective:    Patient ID: Theresa Perez, female    DOB: 07/20/1950, 65 y.o.   MRN: [id number]   Theresa Perez is a 65 y.o. female who presents today for an acute visit.    HPI Comments: Patient here for revaluation of recurrent rash on arms and hands for past 2 months. Triggered by going in the yard. Rash is migratory. Describes as small red, itchy bumps.   Has been seen twice recently for this same rash and tried with triamcinolone cream which improved. Rash seems to be less triggered when wearing gloves in the yard. She was also prescribed permethrin however has not taken it as doesn't believe scabies.   She notes a remote h/o of this same rash a couple of years ago where she received an IM injection which 'finally worked'.  Per chart review, appears patient received Depo medrol for bee sting .  H/o asthma, seasonal allergies. No eczema. Family h/o eczema.   Past Medical History  Diagnosis Date  . Hypertension   . Hypothyroidism   . Allergy   . Anxiety   . Cyst of breast, right, diffuse fibrocystic   . 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 Complication of anesthesia   . PONV (postoperative nausea and vomiting)    Allergies: Review of patient's allergies indicates no known allergies. Current Outpatient Prescriptions on File Prior to Visit  Medication Sig Dispense Refill  . Ascorbic Acid (VITAMIN C PO) Take 1 tablet by mouth daily.    [name] atenolol-chlorthalidone (TENORETIC) 50-25 MG tablet Take 0.5 tablets by mouth daily. 45 tablet 3  . Black Cohosh 540 MG CAPS Take 540 mg by mouth 2 (two) times daily.     . butalbital-acetaminophen-caffeine (ESGIC) 50-325-40 MG per tablet Take 1 tablet by mouth 2 (two) times daily as needed for headache. 30 tablet 1  . CALCIUM PO Take 1 tablet by mouth daily.    [name] COENZYME Q-10 PO Take 1 tablet by mouth daily.    . Cyanocobalamin (VITAMIN B-12 PO) Take 1 tablet by mouth daily.    . DULoxetine (CYMBALTA) 60 MG capsule Take 1 capsule (60 mg  total) by mouth 2 (two) times daily. 60 capsule 5  . ECHINACEA PO Take 1 tablet by mouth daily.    . ferrous sulfate 325 (65 FE) MG tablet Take 325 mg by mouth every evening.     . fluticasone (FLONASE) 50 MCG/ACT nasal spray USE TWO SPRAY(S) IN EACH NOSTRIL ONCE DAILY 16 g 2  . Ginkgo Biloba (GNP GINGKO BILOBA EXTRACT PO) Take 1 tablet by mouth daily.    [name] levothyroxine (SYNTHROID, LEVOTHROID) 112 MCG tablet Take 1 tablet (112 mcg total) by mouth daily. 90 tablet 3  . Misc Natural Products (GLUCOSAMINE CHOND COMPLEX/MSM) TABS Take 1 tablet by mouth 2 (two) times daily.    . Multiple Vitamins-Minerals (MULTIVITAMIN WITH MINERALS) tablet Take 1 tablet by mouth daily.    . Nutritional Supplements (DHEA PO) Take 1 tablet by mouth daily.    [name] triamcinolone cream (KENALOG) 0.1 % Apply 1 application topically 2 (two) times daily. 30 g 0   No current facility-administered medications on file prior to visit.    Social History  Substance Use Topics  . Smoking status: Current Every Day Smoker -- 0.30 packs/day for 15 years    Types: Cigarettes  . Smokeless tobacco: Never Used     Comment: Occassional smoker  . Alcohol Use: Yes     Comment: Rarely  Review of Systems  Constitutional: Negative for fever and chills.  Respiratory: Negative for cough.   Cardiovascular: Negative for chest pain and palpitations.  Gastrointestinal: Negative for nausea and vomiting.  Musculoskeletal: Negative for myalgias.  Skin: Positive for rash.      Objective:    BP 142/86 mmHg  Pulse 86  Temp(Src) 98.3 F (36.8 C) (Oral)  Resp 16  Ht 5\' 10"  (1.778 m)  Wt 189 lb (85.73 kg)  BMI 27.12 kg/m2  SpO2 97%   Physical Exam  Constitutional: She appears well-developed and well-nourished.  Eyes: Conjunctivae are normal.  Cardiovascular: Normal rate, regular rhythm, normal heart sounds and normal pulses.   Pulmonary/Chest: Effort normal and breath sounds normal. She has no wheezes. She has no rhonchi. She has  no rales.  Neurological: She is alert.  Skin: Skin is warm and dry. Rash noted. Rash is maculopapular.     Several confluent, mildly excoriated maculopapular lesions noted posterior neck. No streaking, drainage. No vesicles.  Psychiatric: She has a normal mood and affect. Her speech is normal and behavior is normal. Thought content normal.  Vitals reviewed.      Assessment & Plan:   1. Rash and nonspecific skin eruption Working diagnosis of contact dermatitis most likely from allergen in the yard. Alternatively considering eczema as patient has atopic features. If rash does not improve with injection topical cream, patient and I discussed a referral to an allergist.  - methylPREDNISolone acetate (DEPO-MEDROL) injection 40 mg; Inject 0.5 mLs (40 mg total) into the muscle once. - triamcinolone cream (KENALOG) 0.1 %; Apply 1 application topically 2 (two) times daily.  Dispense: 30 g; Refill: 1 - methylPREDNISolone acetate (DEPO-MEDROL) injection 40 mg; Inject 1 mL (40 mg total) into the muscle once.    I am having Ms. Lafrenier maintain her CALCIUM PO, Black Cohosh, GLUCOSAMINE CHOND COMPLEX/MSM, Ginkgo Biloba (GNP GINGKO BILOBA EXTRACT PO), ECHINACEA PO, multivitamin with minerals, Cyanocobalamin (VITAMIN B-12 PO), Nutritional Supplements (DHEA PO), ferrous sulfate, COENZYME Q-10 PO, Ascorbic Acid (VITAMIN C PO), butalbital-acetaminophen-caffeine, atenolol-chlorthalidone, DULoxetine, fluticasone, triamcinolone cream, and levothyroxine.   No orders of the defined types were placed in this encounter.     Start medications as prescribed and explained to patient on After Visit Summary ( AVS). Risks, benefits, and alternatives of the medications and treatment plan prescribed today were discussed, and patient expressed understanding.   Education regarding symptom management and diagnosis given to patient.   Follow-up:Plan follow-up as discussed or as needed if any worsening symptoms or change  in condition.   Continue to follow with Hoyt Koch, MD for routine health maintenance.   Genoveva Ill and I agreed with plan.   Mable Paris, FNP

## 2015-07-07 ENCOUNTER — Other Ambulatory Visit: Payer: Self-pay | Admitting: Internal Medicine

## 2015-07-31 ENCOUNTER — Encounter: Payer: Self-pay | Admitting: Internal Medicine

## 2015-08-04 ENCOUNTER — Encounter: Payer: Self-pay | Admitting: Internal Medicine

## 2015-08-04 ENCOUNTER — Other Ambulatory Visit: Payer: Self-pay | Admitting: Internal Medicine

## 2015-08-04 ENCOUNTER — Ambulatory Visit (INDEPENDENT_AMBULATORY_CARE_PROVIDER_SITE_OTHER): Payer: BC Managed Care – PPO | Admitting: Internal Medicine

## 2015-08-04 VITALS — BP 110/60 | HR 86 | Temp 98.5°F | Ht 70.0 in | Wt 186.0 lb

## 2015-08-04 DIAGNOSIS — T887XXA Unspecified adverse effect of drug or medicament, initial encounter: Secondary | ICD-10-CM | POA: Diagnosis not present

## 2015-08-04 DIAGNOSIS — T50905A Adverse effect of unspecified drugs, medicaments and biological substances, initial encounter: Secondary | ICD-10-CM

## 2015-08-04 MED ORDER — DULOXETINE HCL 60 MG PO CPEP: 60.0000 mg | ORAL_CAPSULE | Freq: Two times a day (BID) | ORAL | Status: DC

## 2015-08-04 MED ORDER — HYDROXYZINE HCL 10 MG PO TABS: 10.0000 mg | ORAL_TABLET | Freq: Three times a day (TID) | ORAL | Status: DC | PRN

## 2015-08-04 MED ORDER — PREDNISONE 20 MG PO TABS: 40.0000 mg | ORAL_TABLET | Freq: Every day | ORAL | Status: DC

## 2015-08-04 NOTE — Patient Instructions (Signed)
We have called in prednisone that you will take 2 pills daily for the next 6 days.   We have also called in hydroxyzine that you can use for the itching. It is like benadryl and you can take it every 6 hours as needed. Like benadryl it can make you feel sleepy or drowsy and you should not drive or operate machinery until you know how it will affect you.   We recommend that you buy some benadryl gel over the counter to use on the itching as this is the best form to use.   Drug Allergy Allergic reactions to medicines are common. Some allergic reactions are mild. A delayed type of drug allergy that occurs 1 week or more after exposure to a medicine or vaccine is called serum sickness. A life-threatening, sudden (acute) allergic reaction that involves the whole body is called anaphylaxis. CAUSES  "True" drug allergies occur when there is an allergic reaction to a medicine. This is caused by overactivity of the immune system. First, the body becomes sensitized. The immune system is triggered by your first exposure to the medicine. Following this first exposure, future exposure to the same medicine may be life-threatening. Almost any medicine can cause an allergic reaction. Common ones are:  Penicillin.  Sulfonamides (sulfa drugs).  Local anesthetics.  X-ray dyes that contain iodine. SYMPTOMS  Common symptoms of a minor allergic reaction are:  Swelling around the mouth.  An itchy red rash or hives.  Vomiting or diarrhea. Anaphylaxis can cause swelling of the mouth and throat. This makes it difficult to breathe and swallow. Severe reactions can be fatal within seconds, even after exposure to only a trace amount of the drug that causes the reaction. HOME CARE INSTRUCTIONS  If you are unsure of what caused your reaction, write down:  The names of the medicines you took.  How much medicine you took.  How you took the medicine, such as whether you took a pill, injected the medicine, or  applied it to your skin.  All of the things you ate and drank.  The date and time of your reaction.  The symptoms of the reaction.  You may want to follow up with an allergy specialist after the reaction has cleared in order to be tested to confirm the allergy. It is important to confirm that your reaction is an allergy, not just a side effect to the medicine. If you have a true allergy to a medicine, this may prevent that medicine and related medicines from being given to you when you are very ill.  If you have hives or a rash:  Take medicines as directed by your caregiver.  You may use an over-the-counter antihistamine (diphenhydramine) as needed.  Apply cold compresses to the skin or take baths in cool water. Avoid hot baths or showers.  If you are severely allergic:  Continuous observation after a severe reaction may be needed. Hospitalization is often required.  Wear a medical alert bracelet or necklace stating your allergy.  You and your family must learn how to use an anaphylaxis kit or give an epinephrine injection to temporarily treat an emergency allergic reaction. If you have had a severe reaction, always carry your epinephrine injection or anaphylaxis kit with you. This can be lifesaving if you have a severe reaction.  Do not drive or perform tasks after treatment until the medicines used to treat your reaction have worn off, or until your caregiver says it is okay.  If you have  a drug allergy that was confirmed by your health care provider:  Carry information about the drug allergy with you at all times.  Always check with a pharmacist before taking any over-the-counter medicine. SEEK MEDICAL CARE IF:   You think you had an allergic reaction. Symptoms usually start within 30 minutes after exposure.  Symptoms are getting worse rather than better.  You develop new symptoms.  The symptoms that brought you to your caregiver return. SEEK IMMEDIATE MEDICAL CARE IF:    You have swelling of the mouth, difficulty breathing, or wheezing.  You have a tight feeling in your chest or throat.  You develop hives, swelling, or itching all over your body.  You develop severe vomiting or diarrhea.  You feel faint or pass out. This is an emergency. Use your epinephrine injection or anaphylaxis kit as you have been instructed. Call for emergency medical help. Even if you improve after the injection, you need to be examined at a hospital emergency department. MAKE SURE YOU:   Understand these instructions.  Will watch your condition.  Will get help right away if you are not doing well or get worse.   This information is not intended to replace advice given to you by your health care provider. Make sure you discuss any questions you have with your health care provider.   Document Released: 02/04/2005 Document Revised: 02/25/2014 Document Reviewed: 09/06/2014 Elsevier Interactive Patient Education [employer].

## 2015-08-04 NOTE — Progress Notes (Signed)
Pre visit review using our clinic review tool, if applicable. No additional management support is needed unless otherwise documented below in the visit note. 

## 2015-08-06 DIAGNOSIS — T50905A Adverse effect of unspecified drugs, medicaments and biological substances, initial encounter: Secondary | ICD-10-CM | POA: Insufficient documentation

## 2015-08-06 NOTE — Assessment & Plan Note (Signed)
Rx for prednisone and rx for hydroxyzine and advised she can also use otc benadryl cream or gel to help with itching. Advised not to scratch if possible. It worsening or peeling skin or throat swelling seek care immediately.

## 2015-08-06 NOTE — Progress Notes (Signed)
   Subjective:    Patient ID: Theresa Perez, female    DOB: [birth year], 65 y.o.   MRN: [id number]  HPI The patient is a 65 YO female coming in for medication reaction. She used permethrin cream due to family member having exposure with scabies and recommendation for family to be treated. She then started having some itching and hives after 1-2 days of using cream. She is now having widespread hives and itching. No facial swelling, SOB, or throat irritation. Has not tried anything but took a benadryl this morning. Started 3-4 days ago. Overall stable. Very itchy and she is scratching some.   Review of Systems  Constitutional: Negative for fever, activity change, appetite change, fatigue and unexpected weight change.  HENT: Negative for mouth sores, trouble swallowing and voice change.   Respiratory: Negative for cough, chest tightness and shortness of breath.   Cardiovascular: Negative for chest pain, palpitations and leg swelling.  Gastrointestinal: Negative for nausea, abdominal pain, diarrhea, blood in stool and abdominal distention.  Skin: Positive for color change and rash.  Neurological: Negative.   Psychiatric/Behavioral: Negative.       Objective:   Physical Exam  Constitutional: She is oriented to person, place, and time. She appears well-developed and well-nourished.  HENT:  Head: Normocephalic and atraumatic.  Eyes: EOM are normal.  Neck: Normal range of motion.  Cardiovascular: Normal rate and regular rhythm.   Pulmonary/Chest: Effort normal. No respiratory distress. She has no wheezes. She has no rales.  Abdominal: Soft. Bowel sounds are normal. She exhibits no distension. There is no tenderness. There is no rebound and no guarding.  Musculoskeletal: She exhibits no edema.  Neurological: She is alert and oriented to person, place, and time.  Skin: Skin is warm and dry. Rash noted.  Widespread rash on the torso and arms that is with hives and some stigmata of scratching     Psychiatric: She has a normal mood and affect.   Filed Vitals:   08/04/15 1619  BP: 110/60  Pulse: 86  Temp: 98.5 F (36.9 C)  TempSrc: Oral  Height: 5\' 10"  (1.778 m)  Weight: 186 lb (84.369 kg)  SpO2: 97%      Assessment & Plan:

## 2015-08-09 ENCOUNTER — Encounter: Payer: Self-pay | Admitting: Internal Medicine

## 2015-08-09 DIAGNOSIS — R05 Cough: Secondary | ICD-10-CM

## 2015-08-09 DIAGNOSIS — R21 Rash and other nonspecific skin eruption: Secondary | ICD-10-CM

## 2015-08-09 DIAGNOSIS — R059 Cough, unspecified: Secondary | ICD-10-CM

## 2015-08-10 MED ORDER — TRIAMCINOLONE ACETONIDE 0.1 % EX CREA: 1.0000 "application " | TOPICAL_CREAM | Freq: Two times a day (BID) | CUTANEOUS | Status: DC

## 2015-08-10 NOTE — Addendum Note (Signed)
Addended by: Pricilla Holm A on: 08/10/2015 01:02 PM   Modules accepted: Orders

## 2015-08-11 ENCOUNTER — Ambulatory Visit (INDEPENDENT_AMBULATORY_CARE_PROVIDER_SITE_OTHER)
Admission: RE | Admit: 2015-08-11 | Discharge: 2015-08-11 | Disposition: A | Discharge status: Home / Self Care | Payer: BC Managed Care – PPO | Source: Ambulatory Visit | Attending: Internal Medicine | Admitting: Internal Medicine

## 2015-08-11 DIAGNOSIS — R05 Cough: Secondary | ICD-10-CM | POA: Diagnosis not present

## 2015-08-11 DIAGNOSIS — R059 Cough, unspecified: Secondary | ICD-10-CM

## 2015-08-12 ENCOUNTER — Ambulatory Visit (INDEPENDENT_AMBULATORY_CARE_PROVIDER_SITE_OTHER): Payer: BC Managed Care – PPO | Admitting: Family Medicine

## 2015-08-12 VITALS — BP 116/74 | HR 72 | Temp 98.1°F | Resp 16 | Ht 70.0 in | Wt 183.0 lb

## 2015-08-12 DIAGNOSIS — R21 Rash and other nonspecific skin eruption: Secondary | ICD-10-CM

## 2015-08-12 MED ORDER — HYDROXYZINE HCL 25 MG PO TABS: 25.0000 mg | ORAL_TABLET | Freq: Three times a day (TID) | ORAL | Status: DC | PRN

## 2015-08-12 MED ORDER — PREDNISONE 20 MG PO TABS: ORAL_TABLET | ORAL | Status: DC

## 2015-08-12 NOTE — Progress Notes (Signed)
Subjective:    Patient ID: Theresa Perez, female    DOB: [birth year], 65 y.o.   MRN: [id number] By signing my name below, I, Judithe Modest, attest that this documentation has been prepared under the direction and in the presence of Delman Cheadle, MD. Electronically Signed: Judithe Modest, ER Scribe. 08/12/2015. 3:28 PM.  Chief Complaint  Patient presents with  . Rash    rash and itching all over body. Per pt rash was going away while on predisone but now has return since done with steriod     HPI HPI Comments: Theresa Perez is a 65 y.o. female who presents to [facility] complaining of itching for the last 15 days. Her itching started initially after she used promethean because her granddaughter had scabies and stayed with her. She then had a reaction to the promethean with steadily worsening itchiness. She waited a week to see her PCP and by the time she went to her PCP the itching was fairly severe. At that point she was prescribed prednisone 2 pills, for six days. She completed prednisone yesterday. She is also takes fluticasone daily, and an OTC allergy medication. She is also taking benadryl orally and topical benadryl.   Past Medical History  Diagnosis Date  . Hypertension   . Hypothyroidism   . Allergy   . Anxiety   . Cyst of breast, right, diffuse fibrocystic   . 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 Complication of anesthesia   . PONV (postoperative nausea and vomiting)    Allergies  Allergen Reactions  . Permethrin Rash   Current Outpatient Prescriptions on File Prior to Visit  Medication Sig Dispense Refill  . Ascorbic Acid (VITAMIN C PO) Take 1 tablet by mouth daily.    [name] atenolol-chlorthalidone (TENORETIC) 50-25 MG tablet Take 0.5 tablets by mouth daily. 45 tablet 3  . Black Cohosh 540 MG CAPS Take 540 mg by mouth 2 (two) times daily.     . butalbital-acetaminophen-caffeine (ESGIC) 50-325-40 MG per tablet Take 1 tablet by mouth 2 (two) times daily as needed for  headache. 30 tablet 1  . CALCIUM PO Take 1 tablet by mouth daily.    [name] COENZYME Q-10 PO Take 1 tablet by mouth daily.    . Cyanocobalamin (VITAMIN B-12 PO) Take 1 tablet by mouth daily.    . DULoxetine (CYMBALTA) 60 MG capsule Take 1 capsule (60 mg total) by mouth 2 (two) times daily. 60 capsule 6  . ECHINACEA PO Take 1 tablet by mouth daily.    . ferrous sulfate 325 (65 FE) MG tablet Take 325 mg by mouth every evening.     . fluticasone (FLONASE) 50 MCG/ACT nasal spray USE TWO SPRAY(S) IN EACH NOSTRIL ONCE DAILY 16 g 2  . Ginkgo Biloba (GNP GINGKO BILOBA EXTRACT PO) Take 1 tablet by mouth daily.    [name] levothyroxine (SYNTHROID, LEVOTHROID) 112 MCG tablet Take 1 tablet (112 mcg total) by mouth daily. 90 tablet 3  . Misc Natural Products (GLUCOSAMINE CHOND COMPLEX/MSM) TABS Take 1 tablet by mouth 2 (two) times daily.    . Multiple Vitamins-Minerals (MULTIVITAMIN WITH MINERALS) tablet Take 1 tablet by mouth daily.    . Nutritional Supplements (DHEA PO) Take 1 tablet by mouth daily.     Current Facility-Administered Medications on File Prior to Visit  Medication Dose Route Frequency Provider Last Rate Last Dose  . methylPREDNISolone acetate (DEPO-MEDROL) injection 40 mg  40 mg Intramuscular Once Burnard Hawthorne, FNP  Review of Systems  Constitutional: Negative for fever and chills.  Respiratory: Negative for chest tightness.   Cardiovascular: Negative for chest pain, palpitations and leg swelling.  Gastrointestinal: Negative for nausea, vomiting and abdominal pain.  Skin: Positive for rash.       Itching  Allergic/Immunologic: Negative for immunocompromised state.  Psychiatric/Behavioral: Positive for sleep disturbance and agitation.       Objective:  BP 116/74 mmHg  Pulse 72  Temp(Src) 98.1 F (36.7 C) (Oral)  Resp 16  Ht 5\' 10"  (1.778 m)  Wt 183 lb (83.008 kg)  BMI 26.26 kg/m2  SpO2 98%  Physical Exam  Constitutional: She is oriented to person, place, and time. She  appears well-developed and well-nourished. No distress.  HENT:  Head: Normocephalic and atraumatic.  Eyes: Pupils are equal, round, and reactive to light.  Neck: Neck supple.  Cardiovascular: Normal rate.   Pulmonary/Chest: Effort normal. No respiratory distress.  Musculoskeletal: Normal range of motion.  Neurological: She is alert and oriented to person, place, and time. Coordination normal.  Skin: Skin is warm and dry. She is not diaphoretic.  Psychiatric: She has a normal mood and affect. Her behavior is normal.  Nursing note and vitals reviewed.     Assessment & Plan:   1. Rash and nonspecific skin eruption   Seems to be having an allergic reaction to the topical permetherin.  Was improving on oral prednisone but felt like course ended to soon so will extend.  Meds ordered this encounter  Medications  . predniSONE (DELTASONE) 20 MG tablet    Sig: Take 3 tabs po qd x 2d, 2 tabs po qd x 2d, 1 tabs po qd x 2d, 1/2 tabs po x4d    Dispense:  14 tablet    Refill:  0  . hydrOXYzine (ATARAX/VISTARIL) 25 MG tablet    Sig: Take 1 tablet (25 mg total) by mouth 3 (three) times daily as needed.    Dispense:  60 tablet    Refill:  1    I personally performed the services described in this documentation, which was scribed in my presence. The recorded information has been reviewed and considered, and addended by me as needed.   Delman Cheadle, M.D.  [facility] [address] [place], [place] [address] [phone] phone [phone] fax  08/18/2015 12:05 PM

## 2015-08-12 NOTE — Patient Instructions (Addendum)
IF you received an x-ray today, you will receive an invoice from [facility]. Please contact [facility] at [phone] with questions or concerns regarding your invoice.   IF you received labwork today, you will receive an invoice from [employer]. Please contact [facility] at [phone] with questions or concerns regarding your invoice.   Our billing staff will not be able to assist you with questions regarding bills from these companies.  You will be contacted with the lab results as soon as they are available. The fastest way to get your results is to activate your My Chart account. Instructions are located on the last page of this paperwork. If you have not heard from [place] regarding the results in 2 weeks, please contact this office.     We recommend that you schedule a mammogram for breast cancer screening. Typically, you do not need a referral to do this. Please contact a local imaging center to schedule your mammogram.  [facility] - [phone]  *ask for the Radiology [place] ([place]) - [phone] or [phone]  [facility] - [phone] [place] [phone] [facility] - [phone]  *ask for the [facility] - [phone]  *ask for the Radiology Department [facility] - [phone]  *ask for the [place] - [phone]   Drug Rash A drug rash is a change in the color or texture of the skin that is caused by a drug. It can develop minutes, hours, or days after the person takes the drug. CAUSES This condition is usually caused by a drug allergy. It can also be caused by exposure to sunlight after taking a drug that makes the skin sensitive to light. Drugs that commonly cause rashes include:  Penicillin.  Antibiotic medicines.  Medicines that treat  seizures.  Medicines that treat cancer (chemotherapy).  Aspirin and other nonsteroidal anti-inflammatory drugs (NSAIDs).  Injectable dyes that contain iodine.  Insulin. SYMPTOMS Symptoms of this condition include:  Redness.  Tiny bumps.  Peeling.  Itching.  Itchy welts (hives).  Swelling. The rash may appear on a small area of skin or all over the body. DIAGNOSIS To diagnose the condition, your health care provider will do a physical exam. He or she may also order tests to find out which drug caused the rash. Tests to find the cause of a rash include:  Skin tests.  Blood tests.  Drug challenge. For this test, you stop taking all of the drugs that you do not need to take, and then you start taking them again by adding back one of the drugs at a time. TREATMENT A drug rash may be treated with medicines, including:  Antihistamines. These may be given to relieve itching.  An NSAID. This may be given to reduce swelling and treat pain.  A steroid drug. This may be given to reduce swelling. The rash usually goes away when the person stops taking the drug that caused it. HOME CARE INSTRUCTIONS  Take medicines only as directed by your health care provider.  Let all of your health care providers know about any drug reactions you have had in the past.  If you have hives, take a cool shower or use a cool compress to relieve itchiness. SEEK MEDICAL CARE IF:  You have a fever.  Your rash is not going away.  Your rash gets worse.  Your  rash comes back.  You have wheezing or coughing. SEEK IMMEDIATE MEDICAL CARE IF:  You start to have breathing problems.  You start to have shortness of breath.  You face or throat starts to swell.  You have severe weakness with dizziness or fainting.  You have chest pain.   This information is not intended to replace advice given to you by your health care provider. Make sure you discuss any questions you have with your health  care provider.   Document Released: 03/14/2004 Document Revised: 02/25/2014 Document Reviewed: 12/01/2013 Elsevier Interactive Patient Education [employer].

## 2015-08-14 ENCOUNTER — Other Ambulatory Visit: Payer: Self-pay | Admitting: Internal Medicine

## 2015-08-14 ENCOUNTER — Encounter: Payer: Self-pay | Admitting: Internal Medicine

## 2015-08-14 DIAGNOSIS — J189 Pneumonia, unspecified organism: Secondary | ICD-10-CM

## 2015-08-14 MED ORDER — DOXYCYCLINE HYCLATE 100 MG PO TABS: 100.0000 mg | ORAL_TABLET | Freq: Two times a day (BID) | ORAL | Status: DC

## 2015-08-21 ENCOUNTER — Other Ambulatory Visit: Payer: Self-pay | Admitting: Internal Medicine

## 2015-11-02 ENCOUNTER — Other Ambulatory Visit: Payer: Self-pay | Admitting: Family

## 2015-11-02 DIAGNOSIS — J069 Acute upper respiratory infection, unspecified: Secondary | ICD-10-CM

## 2015-12-13 ENCOUNTER — Telehealth: Payer: Self-pay | Admitting: Internal Medicine

## 2015-12-13 ENCOUNTER — Other Ambulatory Visit: Payer: Self-pay | Admitting: Geriatric Medicine

## 2015-12-13 ENCOUNTER — Other Ambulatory Visit: Payer: Self-pay | Admitting: Internal Medicine

## 2015-12-13 MED ORDER — LEVOTHYROXINE SODIUM 112 MCG PO TABS: 112.0000 ug | ORAL_TABLET | Freq: Every day | ORAL | 0 refills | Status: DC

## 2015-12-13 NOTE — Telephone Encounter (Signed)
Patient is requesting a refill of levothyroxine (SYNTHROID, LEVOTHROID) 112 MCG tablet [id number]   be called in to [facility] on file. She has an appt in November, and isnt sure if dosage will need to be altered.

## 2015-12-13 NOTE — Telephone Encounter (Signed)
Sent to pharmacy 

## 2015-12-15 ENCOUNTER — Ambulatory Visit: Payer: BC Managed Care – PPO | Admitting: Internal Medicine

## 2015-12-26 ENCOUNTER — Other Ambulatory Visit (INDEPENDENT_AMBULATORY_CARE_PROVIDER_SITE_OTHER): Payer: BC Managed Care – PPO

## 2015-12-26 DIAGNOSIS — E038 Other specified hypothyroidism: Secondary | ICD-10-CM | POA: Diagnosis not present

## 2015-12-26 LAB — T4, FREE: Free T4: 0.84 ng/dL (ref 0.60–1.60)

## 2015-12-26 LAB — TSH: TSH: 0.57 u[IU]/mL (ref 0.35–4.50)

## 2015-12-28 ENCOUNTER — Encounter: Payer: Self-pay | Admitting: Internal Medicine

## 2015-12-28 ENCOUNTER — Ambulatory Visit (INDEPENDENT_AMBULATORY_CARE_PROVIDER_SITE_OTHER): Payer: BC Managed Care – PPO | Admitting: Internal Medicine

## 2015-12-28 VITALS — BP 136/76 | HR 80 | Temp 98.3°F | Resp 16 | Ht 70.5 in | Wt 188.0 lb

## 2015-12-28 DIAGNOSIS — E890 Postprocedural hypothyroidism: Secondary | ICD-10-CM

## 2015-12-28 DIAGNOSIS — Z23 Encounter for immunization: Secondary | ICD-10-CM | POA: Diagnosis not present

## 2015-12-28 DIAGNOSIS — L299 Pruritus, unspecified: Secondary | ICD-10-CM | POA: Insufficient documentation

## 2015-12-28 NOTE — Patient Instructions (Signed)
The thyroid levels are doing better on the new dose so we will keep it the same.   We have given you the tetanus shot today.

## 2015-12-28 NOTE — Assessment & Plan Note (Signed)
Some dry skin on exam, talked to her about vaseline daily to help. She does have hydroxyzine which she can use prn for the itching as needed. Does not need refill today. Using benadryl gel as well.

## 2015-12-28 NOTE — Assessment & Plan Note (Signed)
Recent TSH and free T4 discussed at visit. New dosing of synthroid 112 mcg daily is appropriate and she is now feeling better.

## 2015-12-28 NOTE — Progress Notes (Signed)
Pre visit review using our clinic review tool, if applicable. No additional management support is needed unless otherwise documented below in the visit note. 

## 2015-12-28 NOTE — Progress Notes (Signed)
   Subjective:    Patient ID: GENINE MCILVAIN, female    DOB: 1950-10-06, 65 y.o.   MRN: [id number]  HPI The patient is a 65 YO female coming in for follow up of her thyroid and dry skin. She is still taking her thryoid medicine which we increased at last visit. She is feeling better and no more jittery feeling. She denies constipation or hair change. She does have dry skin which is chronic. She does use lotion daily and tries not to scratch. Some area which are rubbing against clothes are the places where she itches.   Review of Systems  Constitutional: Negative for activity change, appetite change, fatigue, fever and unexpected weight change.  Respiratory: Negative.   Cardiovascular: Negative.   Gastrointestinal: Negative.   Endocrine: Negative.   Musculoskeletal: Negative.   Neurological: Negative.       Objective:   Physical Exam  Constitutional: She is oriented to person, place, and time. She appears well-developed and well-nourished.  HENT:  Head: Normocephalic and atraumatic.  Eyes: EOM are normal.  Cardiovascular: Normal rate and regular rhythm.   Pulmonary/Chest: Effort normal and breath sounds normal. No respiratory distress. She has no wheezes. She has no rales.  Abdominal: Soft. She exhibits no distension. There is no tenderness. There is no rebound.  Neurological: She is alert and oriented to person, place, and time.  Skin: Skin is warm and dry.   Vitals:   12/28/15 0815  BP: 136/76  Pulse: 80  Resp: 16  Temp: 98.3 F (36.8 C)  TempSrc: Oral  SpO2: 97%  Weight: 188 lb (85.3 kg)  Height: 5' 10.5" (1.791 m)      Assessment & Plan:  Tdap given at visit.

## 2016-02-29 ENCOUNTER — Ambulatory Visit (INDEPENDENT_AMBULATORY_CARE_PROVIDER_SITE_OTHER): Payer: BC Managed Care – PPO | Admitting: Internal Medicine

## 2016-02-29 ENCOUNTER — Encounter: Payer: Self-pay | Admitting: Internal Medicine

## 2016-02-29 VITALS — BP 136/80 | HR 77 | Temp 98.7°F | Resp 20 | Wt 189.0 lb

## 2016-02-29 DIAGNOSIS — J020 Streptococcal pharyngitis: Secondary | ICD-10-CM

## 2016-02-29 DIAGNOSIS — J029 Acute pharyngitis, unspecified: Secondary | ICD-10-CM | POA: Insufficient documentation

## 2016-02-29 LAB — POCT RAPID STREP A (OFFICE): RAPID STREP A SCREEN: POSITIVE — AB

## 2016-02-29 MED ORDER — AZITHROMYCIN 250 MG PO TABS: ORAL_TABLET | ORAL | 1 refills | Status: DC

## 2016-02-29 NOTE — Progress Notes (Signed)
Subjective:    Patient ID: Theresa Perez, female    DOB: 05-25-1950, 66 y.o.   MRN: [id number]  HPI  Here with 1 day sudden onset severe ST with feeling weak a few days after being around granddaughter now on antibx for strep throat.  Pt denies chest pain, increased sob or doe, wheezing, orthopnea, PND, increased LE swelling, palpitations, dizziness or syncope. Past Medical History:  Diagnosis Date  . Allergy   . Anxiety   . Cancer ([place])    Thyroid-Papillary  . Complication of anesthesia   . Cyst of breast, right, diffuse fibrocystic   . Hypertension   . Hypothyroidism   . PONV (postoperative nausea and vomiting)    Past Surgical History:  Procedure Laterality Date  . ANTERIOR AND POSTERIOR REPAIR N/A 03/31/2012   Procedure: ANTERIOR (CYSTOCELE) AND POSTERIOR REPAIR (RECTOCELE);  Surgeon: Gus Height, MD;  Location: [facility];  Service: Gynecology;  Laterality: N/A;  . aspiration cyst right breast    . THYROIDECTOMY  1997  . TONSILLECTOMY  1969  . uterine ablation    . VAGINAL HYSTERECTOMY N/A 03/31/2012   Procedure: HYSTERECTOMY VAGINAL;  Surgeon: Gus Height, MD;  Location: [facility];  Service: Gynecology;  Laterality: N/A;    reports that she has been smoking Cigarettes.  She has a 4.50 pack-year smoking history. She has never used smokeless tobacco. She reports that she drinks alcohol. She reports that she does not use drugs. family history includes Cancer in her father; Deep vein thrombosis in her mother; Diabetes in her mother; Heart disease in her father. Allergies  Allergen Reactions  . Permethrin Rash   Current Outpatient Prescriptions on File Prior to Visit  Medication Sig Dispense Refill  . Ascorbic Acid (VITAMIN C PO) Take 1 tablet by mouth daily.    [name] atenolol-chlorthalidone (TENORETIC) 50-25 MG tablet TAKE ONE-HALF TABLET BY MOUTH ONCE DAILY 45 tablet 3  . Black Cohosh 540 MG CAPS Take 540 mg by mouth 2 (two) times daily.     . butalbital-acetaminophen-caffeine (ESGIC)  50-325-40 MG per tablet Take 1 tablet by mouth 2 (two) times daily as needed for headache. 30 tablet 1  . CALCIUM PO Take 1 tablet by mouth daily.    [name] COENZYME Q-10 PO Take 1 tablet by mouth daily.    . Cyanocobalamin (VITAMIN B-12 PO) Take 1 tablet by mouth daily.    . DULoxetine (CYMBALTA) 60 MG capsule Take 1 capsule (60 mg total) by mouth 2 (two) times daily. 60 capsule 6  . ECHINACEA PO Take 1 tablet by mouth daily.    . ferrous sulfate 325 (65 FE) MG tablet Take 325 mg by mouth every evening.     . fluticasone (FLONASE) 50 MCG/ACT nasal spray USE TWO SPRAY(S) IN EACH NOSTRIL ONCE DAILY 16 g 2  . Ginkgo Biloba (GNP GINGKO BILOBA EXTRACT PO) Take 1 tablet by mouth daily.    . hydrOXYzine (ATARAX/VISTARIL) 25 MG tablet Take 1 tablet (25 mg total) by mouth 3 (three) times daily as needed. 60 tablet 1  . levothyroxine (SYNTHROID, LEVOTHROID) 112 MCG tablet Take 1 tablet (112 mcg total) by mouth daily. 90 tablet 0  . Misc Natural Products (GLUCOSAMINE CHOND COMPLEX/MSM) TABS Take 1 tablet by mouth 2 (two) times daily.    . Multiple Vitamins-Minerals (MULTIVITAMIN WITH MINERALS) tablet Take 1 tablet by mouth daily.    . Nutritional Supplements (DHEA PO) Take 1 tablet by mouth daily.     No current facility-administered medications on  file prior to visit.    Review of Systems All otherwise neg per pt    Objective:   Physical Exam BP 136/80   Pulse 77   Temp 98.7 F (37.1 C) (Oral)   Resp 20   Wt 189 lb (85.7 kg)   SpO2 94%   BMI 26.74 kg/m  VS noted,  Constitutional: Pt appears in no apparent distress HENT: Head: NCAT.  Right Ear: External ear normal.  Left Ear: External ear normal.  Eyes: . Pupils are equal, round, and reactive to light. Conjunctivae and EOM are normal Bilat tm's with mild erythema.  Max sinus areas non tender.  Pharynx with mod erythema, + exudate Neck: Normal range of motion. Neck supple.  Cardiovascular: Normal rate and regular rhythm.   Pulmonary/Chest:  Effort normal and breath sounds without rales or wheezing.  Neurological: Pt is alert. Not confused , motor grossly intact Skin: Skin is warm. No rash, no LE edema Psychiatric: Pt behavior is normal. No agitation.  No other exam findings  POCT rapid strep A  Order: [id number]  Status:  Final result Visible to patient:  No (Not Released) Dx:  Streptococcal sore throat   Ref Range & Units 17:59  Rapid Strep A Screen Negative Positive             Assessment & Plan:

## 2016-02-29 NOTE — Progress Notes (Signed)
Pre visit review using our clinic review tool, if applicable. No additional management support is needed unless otherwise documented below in the visit note. 

## 2016-02-29 NOTE — Assessment & Plan Note (Signed)
Mild to mod, for antibx course,  to f/u any worsening symptoms or concerns 

## 2016-02-29 NOTE — Patient Instructions (Signed)
Please take all new medication as prescribed - the antibiotic  Please continue all other medications as before, and refills have been done if requested.  Please have the pharmacy call with any other refills you may need.  Please keep your appointments with your specialists as you may have planned   

## 2016-04-08 ENCOUNTER — Other Ambulatory Visit: Payer: Self-pay | Admitting: Family

## 2016-04-08 ENCOUNTER — Other Ambulatory Visit: Payer: Self-pay | Admitting: Internal Medicine

## 2016-04-08 DIAGNOSIS — J069 Acute upper respiratory infection, unspecified: Secondary | ICD-10-CM

## 2016-05-01 ENCOUNTER — Other Ambulatory Visit: Payer: Self-pay | Admitting: Internal Medicine

## 2016-05-01 DIAGNOSIS — Z1231 Encounter for screening mammogram for malignant neoplasm of breast: Secondary | ICD-10-CM

## 2016-05-10 ENCOUNTER — Ambulatory Visit
Admission: RE | Admit: 2016-05-10 | Discharge: 2016-05-10 | Disposition: A | Discharge status: Home / Self Care | Payer: BC Managed Care – PPO | Source: Ambulatory Visit | Attending: Internal Medicine | Admitting: Internal Medicine

## 2016-05-10 DIAGNOSIS — Z1231 Encounter for screening mammogram for malignant neoplasm of breast: Secondary | ICD-10-CM

## 2016-05-26 IMAGING — CR DG CHEST 2V
2 series · 2 of 2 positions shown · non-contrast
Comparison: None.

CLINICAL DATA: Fatigue and wheezing

EXAM:
CHEST  2 VIEW

[view not recorded (1 of 2)]
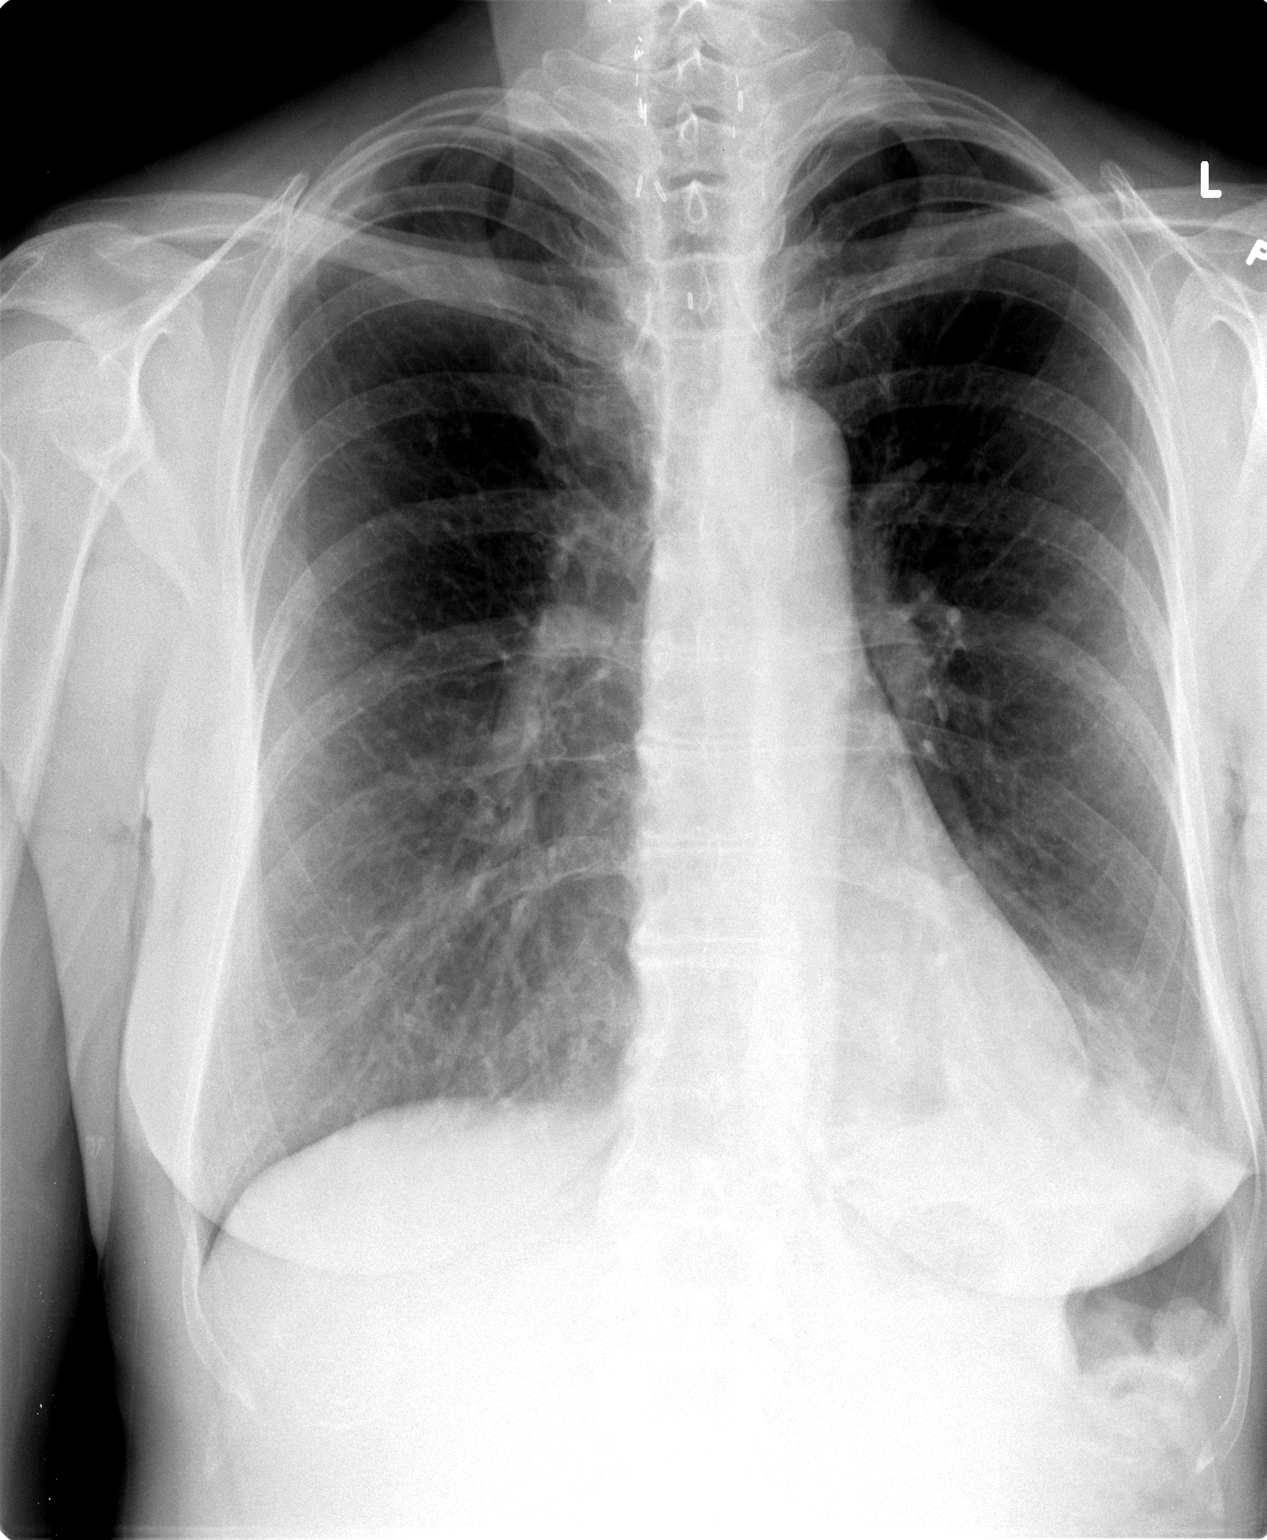

[view not recorded (2 of 2)]
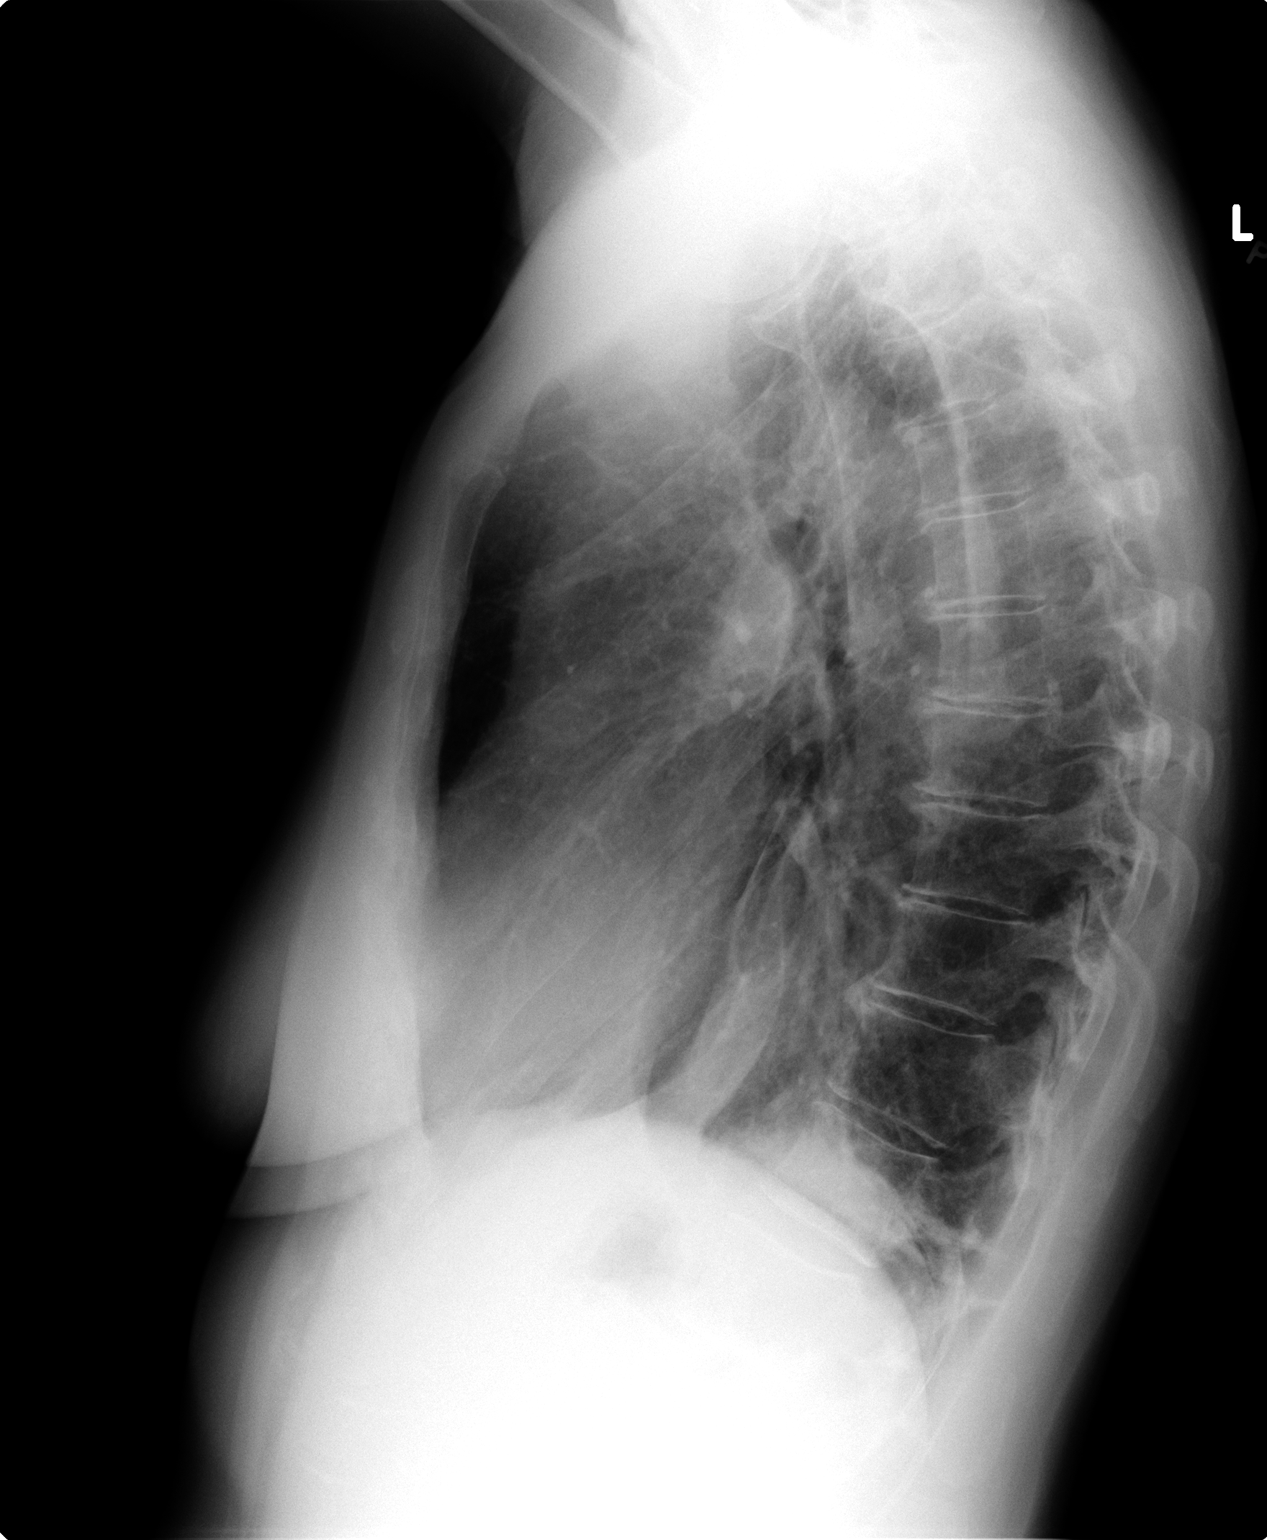

[2 of 2 positions shown; findings below may reference images not displayed]

FINDINGS: There is focal consolidation in the lateral segment of the left
lower lobe. Lungs elsewhere are clear. Heart size and pulmonary
vascularity are normal. No adenopathy. There are surgical clips in
the region of the thyroid.
IMPRESSION: Consolidation in the lateral segment of the left lower lobe.

These results will be called to the ordering clinician or
representative by the Radiologist Assistant, and communication
documented in the PACS or zVision Dashboard.

## 2016-07-03 ENCOUNTER — Other Ambulatory Visit: Payer: Self-pay | Admitting: Family

## 2016-07-03 DIAGNOSIS — J069 Acute upper respiratory infection, unspecified: Secondary | ICD-10-CM

## 2016-08-12 ENCOUNTER — Other Ambulatory Visit: Payer: Self-pay | Admitting: Internal Medicine

## 2016-08-13 NOTE — Telephone Encounter (Signed)
Not sent in since 2016, didn't see anything on website if it shows up there

## 2016-08-13 NOTE — Telephone Encounter (Signed)
Needs office visit for refills.

## 2016-08-14 ENCOUNTER — Other Ambulatory Visit (INDEPENDENT_AMBULATORY_CARE_PROVIDER_SITE_OTHER): Payer: BC Managed Care – PPO

## 2016-08-14 ENCOUNTER — Encounter: Payer: Self-pay | Admitting: Nurse Practitioner

## 2016-08-14 ENCOUNTER — Ambulatory Visit (INDEPENDENT_AMBULATORY_CARE_PROVIDER_SITE_OTHER): Payer: BC Managed Care – PPO | Admitting: Nurse Practitioner

## 2016-08-14 VITALS — BP 126/80 | HR 72 | Temp 97.9°F | Ht 70.5 in | Wt 191.0 lb

## 2016-08-14 DIAGNOSIS — E890 Postprocedural hypothyroidism: Secondary | ICD-10-CM

## 2016-08-14 DIAGNOSIS — F4322 Adjustment disorder with anxiety: Secondary | ICD-10-CM | POA: Diagnosis not present

## 2016-08-14 DIAGNOSIS — C73 Malignant neoplasm of thyroid gland: Secondary | ICD-10-CM | POA: Diagnosis not present

## 2016-08-14 DIAGNOSIS — I10 Essential (primary) hypertension: Secondary | ICD-10-CM

## 2016-08-14 DIAGNOSIS — G44229 Chronic tension-type headache, not intractable: Secondary | ICD-10-CM

## 2016-08-14 DIAGNOSIS — R519 Headache, unspecified: Secondary | ICD-10-CM | POA: Insufficient documentation

## 2016-08-14 DIAGNOSIS — R51 Headache: Secondary | ICD-10-CM

## 2016-08-14 LAB — BASIC METABOLIC PANEL
BUN: 10 mg/dL (ref 6–23)
CALCIUM: 9.5 mg/dL (ref 8.4–10.5)
CO2: 34 mEq/L — ABNORMAL HIGH (ref 19–32)
CREATININE: 0.7 mg/dL (ref 0.40–1.20)
Chloride: 99 mEq/L (ref 96–112)
GFR: 89.3 mL/min (ref >60.00)
Glucose, Bld: 106 mg/dL — ABNORMAL HIGH (ref 70–99)
Potassium: 3.5 mEq/L (ref 3.5–5.1)
Sodium: 138 mEq/L (ref 135–145)

## 2016-08-14 LAB — T4, FREE: Free T4: 0.69 ng/dL (ref 0.60–1.60)

## 2016-08-14 LAB — TSH: TSH: 0.21 u[IU]/mL — AB (ref 0.35–4.50)

## 2016-08-14 MED ORDER — BUTALBITAL-APAP-CAFFEINE 50-325-40 MG PO TABS: 1.0000 | ORAL_TABLET | Freq: Two times a day (BID) | ORAL | 1 refills | Status: DC | PRN

## 2016-08-14 MED ORDER — ATENOLOL-CHLORTHALIDONE 50-25 MG PO TABS: 0.5000 | ORAL_TABLET | Freq: Every day | ORAL | 1 refills | Status: DC

## 2016-08-14 MED ORDER — DULOXETINE HCL 60 MG PO CPEP: 60.0000 mg | ORAL_CAPSULE | Freq: Two times a day (BID) | ORAL | 1 refills | Status: DC

## 2016-08-14 MED ORDER — LEVOTHYROXINE SODIUM 112 MCG PO TABS
112.0000 ug | ORAL_TABLET | Freq: Every day | ORAL | 1 refills | Status: DC
Start: 2016-08-14 — End: 2016-10-22

## 2016-08-14 NOTE — Progress Notes (Signed)
Subjective:  Patient ID: Theresa Perez, female    DOB: 02/09/1951  Age: 66 y.o. MRN: [id number]  CC: Follow-up (6 mo fu/legs swelling--standing alot? constipation/ poison ivy? thyroid lab?)   HPI Edema: With prolong standing and walking. Improves with elevation. No leg pain, no claudication.  Constipation: Chronic. Improvement with miralax. No previous colonoscopy. Wants to want till insurance changes to medicare to minimize cost.  Rash: Contact with poison ivy. Started on oral prednisone  And triamcinolone 08/11/2016.  Outpatient Medications Prior to Visit  Medication Sig Dispense Refill  . Ascorbic Acid (VITAMIN C PO) Take 1 tablet by mouth daily.    . Black Cohosh 540 MG CAPS Take 540 mg by mouth 2 (two) times daily.     [name] CALCIUM PO Take 1 tablet by mouth daily.    [name] COENZYME Q-10 PO Take 1 tablet by mouth daily.    . Cyanocobalamin (VITAMIN B-12 PO) Take 1 tablet by mouth daily.    [name] ECHINACEA PO Take 1 tablet by mouth daily.    . ferrous sulfate 325 (65 FE) MG tablet Take 325 mg by mouth every evening.     . fluticasone (FLONASE) 50 MCG/ACT nasal spray INSTILL 2 SPRAY(S) IN EACH NOSTRIL ONCE DAILY 16 g 1  . Ginkgo Biloba (GNP GINGKO BILOBA EXTRACT PO) Take 1 tablet by mouth daily.    . Misc Natural Products (GLUCOSAMINE CHOND COMPLEX/MSM) TABS Take 1 tablet by mouth 2 (two) times daily.    . Multiple Vitamins-Minerals (MULTIVITAMIN WITH MINERALS) tablet Take 1 tablet by mouth daily.    . Nutritional Supplements (DHEA PO) Take 1 tablet by mouth daily.    [name] atenolol-chlorthalidone (TENORETIC) 50-25 MG tablet TAKE ONE-HALF TABLET BY MOUTH ONCE DAILY 45 tablet 3  . butalbital-acetaminophen-caffeine (ESGIC) 50-325-40 MG per tablet Take 1 tablet by mouth 2 (two) times daily as needed for headache. 30 tablet 1  . DULoxetine (CYMBALTA) 60 MG capsule TAKE ONE CAPSULE BY MOUTH TWICE DAILY 60 capsule 6  . levothyroxine (SYNTHROID, LEVOTHROID) 112 MCG tablet Take 1 tablet (112  mcg total) by mouth daily. 90 tablet 0  . azithromycin (ZITHROMAX Z-PAK) 250 MG tablet 2 tab by mouth day 1, then 1 per day (Patient not taking: Reported on 08/14/2016) 6 tablet 1  . hydrOXYzine (ATARAX/VISTARIL) 25 MG tablet Take 1 tablet (25 mg total) by mouth 3 (three) times daily as needed. (Patient not taking: Reported on 08/14/2016) 60 tablet 1   No facility-administered medications prior to visit.     ROS See HPI  Objective:  BP 126/80   Pulse 72   Temp 97.9 F (36.6 C)   Ht 5' 10.5" (1.791 m)   Wt 191 lb (86.6 kg)   SpO2 99%   BMI 27.02 kg/m   BP Readings from Last 3 Encounters:  08/14/16 126/80  02/29/16 136/80  12/28/15 136/76    Wt Readings from Last 3 Encounters:  08/14/16 191 lb (86.6 kg)  02/29/16 189 lb (85.7 kg)  12/28/15 188 lb (85.3 kg)    Physical Exam  Constitutional: She is oriented to person, place, and time. No distress.  HENT:  Right Ear: External ear normal.  Left Ear: External ear normal.  Nose: Nose normal.  Mouth/Throat: No oropharyngeal exudate.  Eyes: No scleral icterus.  Neck: Normal range of motion. Neck supple. No thyromegaly present.  Cardiovascular: Normal rate, regular rhythm and normal heart sounds.   Pulmonary/Chest: Effort normal and breath sounds normal. No respiratory distress.  Abdominal: Soft.  She exhibits no distension.  Musculoskeletal: Normal range of motion. She exhibits no edema.  Neurological: She is alert and oriented to person, place, and time.  Skin: Skin is warm and dry.  Psychiatric: She has a normal mood and affect. Her behavior is normal.    Lab Results  Component Value Date   WBC 6.0 12/01/2013   HGB 13.5 12/01/2013   HCT 40.5 12/01/2013   PLT 323.0 12/01/2013   GLUCOSE 106 (H) 08/14/2016   CHOL 189 12/16/2014   TRIG 128.0 12/16/2014   HDL 48.10 12/16/2014   LDLCALC 115 (H) 12/16/2014   ALT 23 06/16/2015   AST 23 06/16/2015   NA 138 08/14/2016   K 3.5 08/14/2016   CL 99 08/14/2016   CREATININE  0.70 08/14/2016   BUN 10 08/14/2016   CO2 34 (H) 08/14/2016   TSH 0.21 (L) 08/14/2016    Mm Screening Breast Tomo Bilateral  Result Date: 05/10/2016 CLINICAL DATA:  Screening. EXAM: 2D DIGITAL SCREENING BILATERAL MAMMOGRAM WITH CAD AND ADJUNCT TOMO COMPARISON:  Previous exam(s). ACR Breast Density Category c: The breast tissue is heterogeneously dense, which may obscure small masses. FINDINGS: There are no findings suspicious for malignancy. Images were processed with CAD. IMPRESSION: No mammographic evidence of malignancy. A result letter of this screening mammogram will be mailed directly to the patient. RECOMMENDATION: Screening mammogram in one year. (Code:SM-B-01Y) BI-RADS CATEGORY  1: Negative. Electronically Signed   By: Fidela Salisbury M.D.   On: 05/10/2016 13:11    Assessment & Plan:   La was seen today for follow-up.  Diagnoses and all orders for this visit:  Postoperative hypothyroidism -     TSH; Future -     T4, free; Future -     levothyroxine (SYNTHROID, LEVOTHROID) 112 MCG tablet; Take 1 tablet (112 mcg total) by mouth daily.  Papillary thyroid carcinoma (HCC)  Chronic tension-type headache, not intractable -     butalbital-acetaminophen-caffeine (ESGIC) 50-325-40 MG tablet; Take 1 tablet by mouth 2 (two) times daily as needed for headache.  Essential hypertension -     Basic metabolic panel; Future -     atenolol-chlorthalidone (TENORETIC) 50-25 MG tablet; Take 0.5 tablets by mouth daily.  Adjustment disorder with anxiety -     DULoxetine (CYMBALTA) 60 MG capsule; Take 1 capsule (60 mg total) by mouth 2 (two) times daily.   I have discontinued Ms. Solinger hydrOXYzine and azithromycin. I have also changed her butalbital-acetaminophen-caffeine, DULoxetine, and atenolol-chlorthalidone. Additionally, I am having her maintain her CALCIUM PO, Black Cohosh, GLUCOSAMINE CHOND COMPLEX/MSM, Ginkgo Biloba (GNP GINGKO BILOBA EXTRACT PO), ECHINACEA PO, multivitamin  with minerals, Cyanocobalamin (VITAMIN B-12 PO), Nutritional Supplements (DHEA PO), ferrous sulfate, COENZYME Q-10 PO, Ascorbic Acid (VITAMIN C PO), fluticasone, triamcinolone cream, predniSONE, and levothyroxine.  Meds ordered this encounter  Medications  . triamcinolone cream (KENALOG) 0.1 %  . predniSONE (STERAPRED UNI-PAK 21 TAB) 10 MG (21) TBPK tablet  . butalbital-acetaminophen-caffeine (ESGIC) 50-325-40 MG tablet    Sig: Take 1 tablet by mouth 2 (two) times daily as needed for headache.    Dispense:  30 tablet    Refill:  1    Order Specific Question:   Supervising Provider    Answer:   Cassandria Anger [[id number]]  . DULoxetine (CYMBALTA) 60 MG capsule    Sig: Take 1 capsule (60 mg total) by mouth 2 (two) times daily.    Dispense:  180 capsule    Refill:  1    Please consider 90  day supplies to promote better adherence    Order Specific Question:   Supervising Provider    Answer:   Cassandria Anger [[id number]]  . atenolol-chlorthalidone (TENORETIC) 50-25 MG tablet    Sig: Take 0.5 tablets by mouth daily.    Dispense:  45 tablet    Refill:  1    Order Specific Question:   Supervising Provider    Answer:   Cassandria Anger [[id number]]  . levothyroxine (SYNTHROID, LEVOTHROID) 112 MCG tablet    Sig: Take 1 tablet (112 mcg total) by mouth daily.    Dispense:  90 tablet    Refill:  1    Order Specific Question:   Supervising Provider    Answer:   Cassandria Anger [[id number]]   Follow-up: Return in about 6 months (around 02/13/2017) for with Dr. Sharlet Salina.Wilfred Lacy, NP

## 2016-08-14 NOTE — Patient Instructions (Addendum)
Go to basement for blood draw. Will send prescription after review of lab results.  Maintain low salt diet to minimize LE swelling. Wear compression stocking during the day and off at night.  If no improvement in contact dermatitis in 1week, I will send another prednisone dose pack.

## 2016-08-16 ENCOUNTER — Telehealth: Payer: Self-pay | Admitting: Internal Medicine

## 2016-08-16 DIAGNOSIS — L247 Irritant contact dermatitis due to plants, except food: Secondary | ICD-10-CM

## 2016-08-16 MED ORDER — PREDNISONE 10 MG (21) PO TBPK: ORAL_TABLET | ORAL | 0 refills | Status: DC

## 2016-08-16 MED ORDER — TRIAMCINOLONE ACETONIDE 0.1 % EX CREA: TOPICAL_CREAM | Freq: Two times a day (BID) | CUTANEOUS | 0 refills | Status: DC

## 2016-08-16 NOTE — Telephone Encounter (Signed)
Pt said that there was also a cream that she needed. She said it was called "Triamcinolone".

## 2016-08-16 NOTE — Telephone Encounter (Signed)
LM informing pt.

## 2016-08-16 NOTE — Telephone Encounter (Signed)
Pt was seen by Baldo Ash on 08/14/2016 and had rash after being in contact with poison ivy. She said that [place] told her if it did not get any better to let us know and she could send in another medication. She said that so far it has not gotten better and is still itching. She would like this sent to [facility] on [address]. The pt would like a call back when this has been done.

## 2016-08-23 ENCOUNTER — Encounter: Payer: Self-pay | Admitting: Nurse Practitioner

## 2016-09-09 ENCOUNTER — Encounter: Payer: Self-pay | Admitting: Nurse Practitioner

## 2016-09-11 ENCOUNTER — Encounter: Payer: Self-pay | Admitting: Nurse Practitioner

## 2016-09-18 ENCOUNTER — Telehealth: Payer: Self-pay | Admitting: Internal Medicine

## 2016-09-18 NOTE — Telephone Encounter (Signed)
Patient has not been seen since 2016, please schedule her to be seen. Thank you!

## 2016-09-18 NOTE — Telephone Encounter (Signed)
I agree that the LT4 dose should be lower to target normal TSH, but I would like to see the pt back for dose adjustment as I have not seen her in a long time.

## 2016-09-18 NOTE — Telephone Encounter (Signed)
This Patient has already been scheduled

## 2016-09-18 NOTE — Telephone Encounter (Signed)
Patient is scheduled to see Dr. Cruzita Lederer for a follow up for a second opinion on Oct. 2 which is the earliest appointment. Patient needs something sooner if possible. Call patient to advise.

## 2016-09-18 NOTE — Telephone Encounter (Signed)
Noted. Thanks! Didn't see that, sorry!

## 2016-09-18 NOTE — Telephone Encounter (Signed)
-----   Message from Janeece Fitting sent at 09/18/2016 11:17 AM EDT -----      ----- Message sent from Cody, Charlene Brooke, NP to Genoveva Ill at 1/77/1165 7:90 AM -----   Ms. Amanda Cockayne,  My recommendations are based on Guidelines from the American Thyroid Association. I will be glad to enter a referral to endocrinology for a second opinion.      ----- Message -----   From: Genoveva Ill   Sent: 3/83/3383 2:38 PM EDT    To: Wilfred Lacy, NP  Subject: RE: Visit Follow-Up Question    If you are concerned that my thyroid was removed due to cancer, that is not the case. I had a large goiter, and during the surgery, they found a small amount of papillary cancer. If my test results are lower than the lowest number in the normal range, doesn't that mean that my dosage should be changed? I would feel more comfortable if you consulted with Dr. Cruzita Lederer, the [facility] endocrinologist who got my levels straightened out in early 2016. Is she still at [facility]?   ----- Message -----  From: Wilfred Lacy, NP  Sent: 09/09/2016 12:26 PM EDT  To: Genoveva Ill  Subject: RE: Visit Follow-Up Question  No I do not think your dose should be changed at this time.  Please refer to my explanation in the previous mychart message.    Thank you      ----- Message -----   From: Genoveva Ill   Sent: 2/91/9166 7:32 AM EDT    To: Wilfred Lacy, NP  Subject: Visit [place], Your response to my question about my TSH level means that you think my level is within normal limits. However, the normal range is .35-4.5, and my level is .21. Shouldn't my prescription strength be changed?

## 2016-09-18 NOTE — Telephone Encounter (Signed)
Please see below. Thanks!

## 2016-10-12 ENCOUNTER — Other Ambulatory Visit: Payer: Self-pay | Admitting: Internal Medicine

## 2016-10-12 DIAGNOSIS — J069 Acute upper respiratory infection, unspecified: Secondary | ICD-10-CM

## 2016-10-22 ENCOUNTER — Encounter: Payer: Self-pay | Admitting: Internal Medicine

## 2016-10-22 ENCOUNTER — Ambulatory Visit (INDEPENDENT_AMBULATORY_CARE_PROVIDER_SITE_OTHER): Payer: Medicare Other | Admitting: Internal Medicine

## 2016-10-22 VITALS — BP 140/80 | HR 78 | Ht 70.5 in | Wt 189.0 lb

## 2016-10-22 DIAGNOSIS — E890 Postprocedural hypothyroidism: Secondary | ICD-10-CM | POA: Diagnosis not present

## 2016-10-22 DIAGNOSIS — C73 Malignant neoplasm of thyroid gland: Secondary | ICD-10-CM | POA: Diagnosis not present

## 2016-10-22 LAB — TSH: TSH: 0.27 u[IU]/mL — ABNORMAL LOW (ref 0.35–4.50)

## 2016-10-22 LAB — T4, FREE: FREE T4: 0.86 ng/dL (ref 0.60–1.60)

## 2016-10-22 MED ORDER — LEVOTHYROXINE SODIUM 100 MCG PO TABS: 100.0000 ug | ORAL_TABLET | Freq: Every day | ORAL | 5 refills | Status: DC

## 2016-10-22 NOTE — Patient Instructions (Addendum)
Please continue levothyroxine 112 mcg daily.  Take the thyroid hormone every day, with water, at least 30 minutes before breakfast, separated by at least 4 hours from: - acid reflux medications - calcium - iron - multivitamins  Please stop at the lab.  Please return in 1 year.

## 2016-10-22 NOTE — Progress Notes (Signed)
Patient ID: HENA EWALT, female   DOB: [birth year], 66 y.o.   MRN: [id number]   HPI  Theresa Perez is a 66 y.o.-year-old female, returning for follow-up for postsurgical hypothyroidism and distant history of papillary thyroid cancer. Last visit 2 years and 2 mo ago.  Reviewed history: Pt. has been dx with goiter 1990s >> thyroidectomy in 1997 >> hypothyroidism; She was then found to have a 1/16 inch papillary thyroid cancer focus then. 2 LNs were negative. No RAI Tx needed.  Patient was on high doses of levothyroxine for a very long period of time >> we started to decrease the dose of levothyroxine after our first visit, now back on 112 mcg daily.  She takes LT4: - in am - fasting - coffee with creamer <30 min later - at least 60 min from b'fast - + Ca, Fe, MVI - at night - no PPIs - not on Biotin  I reviewed pt's thyroid tests: Lab Results  Component Value Date   TSH 0.21 (L) 08/14/2016   TSH 0.57 12/26/2015   TSH 1.92 06/16/2015   TSH 4.07 05/15/2015   TSH 4.36 01/27/2015   TSH 0.56 09/13/2014   TSH 0.16 (L) 07/22/2014   TSH 0.17 (L) 06/15/2014   TSH 0.09 (L) 05/06/2014   TSH 0.07 (L) 03/04/2014   FREET4 0.69 08/14/2016   FREET4 0.84 12/26/2015   FREET4 0.68 06/16/2015   FREET4 0.68 05/15/2015   FREET4 0.60 01/27/2015   FREET4 0.81 09/13/2014   FREET4 0.72 07/22/2014   FREET4 0.74 06/15/2014   FREET4 1.05 05/06/2014   FREET4 1.41 11/04/2012   Previously: Lab Results  Component Value Date   TSH 0.04* 11/04/2012   TSH 0.06* 10/29/2012   TSH 0.03* 10/25/2011   TSH 0.04* 10/10/2010   TSH 0.04* 09/15/2009   TSH 0.012 02/25/2009   TSH 0.009  02/24/2009   TSH 0.02* 02/13/2009   FREET4 1.41 11/04/2012   FREET4 1.41 01/05/2010   FREET4 2.09* 02/25/2009   FREET4 2.18* 02/24/2009   FREET4 1.9* 02/13/2009    Pt denies: - feeling nodules in neck - hoarseness - choking - SOB with lying down  She has: - + dysphagia with dry foods  ROS: Constitutional: no  weight gain/no weight loss, no fatigue, + subjective hyperthermia, no subjective hypothermia Eyes: no blurry vision, no xerophthalmia ENT: no sore throat, + see HPI Cardiovascular: no CP/no SOB/no palpitations/no leg swelling Respiratory: no cough/no SOB/no wheezing Gastrointestinal: no N/no V/no D/+ C/+ acid reflux Musculoskeletal: no muscle aches/no joint aches Skin: no rashes, no hair loss Neurological: no tremors/no numbness/no tingling/no dizziness  I reviewed pt's medications, allergies, PMH, social hx, family hx, and changes were documented in the history of present illness. Otherwise, unchanged from my initial visit note.  Past Medical History:  Diagnosis Date  . Allergy   . Anxiety   . Cancer ([place])    Thyroid-Papillary  . Complication of anesthesia   . Cyst of breast, right, diffuse fibrocystic   . Hypertension   . Hypothyroidism   . PONV (postoperative nausea and vomiting)    Past Surgical History:  Procedure Laterality Date  . ANTERIOR AND POSTERIOR REPAIR N/A 03/31/2012   Procedure: ANTERIOR (CYSTOCELE) AND POSTERIOR REPAIR (RECTOCELE);  Surgeon: Gus Height, MD;  Location: [facility];  Service: Gynecology;  Laterality: N/A;  . aspiration cyst right breast    . THYROIDECTOMY  1997  . TONSILLECTOMY  1969  . uterine ablation    . VAGINAL HYSTERECTOMY N/A 03/31/2012  Procedure: HYSTERECTOMY VAGINAL;  Surgeon: Gus Height, MD;  Location: [facility];  Service: Gynecology;  Laterality: N/A;   History   Social History  . Marital Status: Married    Spouse Name: N/A    Number of Children: 1   Occupational History  . retired   Social History Main Topics  . Smoking status: Current Every Day Smoker -- 0.5 packs/day for 15 years    Types: Cigarettes  . Smokeless tobacco: Never Used     Comment: Occassional smoker  . Alcohol Use: Yes     Comment: Rarely  . Drug Use: No   Current Outpatient Prescriptions on File Prior to Visit  Medication Sig Dispense Refill  . Ascorbic Acid  (VITAMIN C PO) Take 1 tablet by mouth daily.    [name] atenolol-chlorthalidone (TENORETIC) 50-25 MG tablet Take 0.5 tablets by mouth daily. 45 tablet 1  . Black Cohosh 540 MG CAPS Take 540 mg by mouth 2 (two) times daily.     . butalbital-acetaminophen-caffeine (ESGIC) 50-325-40 MG tablet Take 1 tablet by mouth 2 (two) times daily as needed for headache. 30 tablet 1  . CALCIUM PO Take 1 tablet by mouth daily.    [name] COENZYME Q-10 PO Take 1 tablet by mouth daily.    . Cyanocobalamin (VITAMIN B-12 PO) Take 1 tablet by mouth daily.    . DULoxetine (CYMBALTA) 60 MG capsule Take 1 capsule (60 mg total) by mouth 2 (two) times daily. 180 capsule 1  . ECHINACEA PO Take 1 tablet by mouth daily.    . ferrous sulfate 325 (65 FE) MG tablet Take 325 mg by mouth every evening.     . fluticasone (FLONASE) 50 MCG/ACT nasal spray USE 2 SPRAY(S) IN EACH NOSTRIL ONCE DAILY 16 g 1  . Ginkgo Biloba (GNP GINGKO BILOBA EXTRACT PO) Take 1 tablet by mouth daily.    [name] levothyroxine (SYNTHROID, LEVOTHROID) 112 MCG tablet Take 1 tablet (112 mcg total) by mouth daily. 90 tablet 1  . Misc Natural Products (GLUCOSAMINE CHOND COMPLEX/MSM) TABS Take 1 tablet by mouth 2 (two) times daily.    . Multiple Vitamins-Minerals (MULTIVITAMIN WITH MINERALS) tablet Take 1 tablet by mouth daily.    . Nutritional Supplements (DHEA PO) Take 1 tablet by mouth daily.    . predniSONE (STERAPRED UNI-PAK 21 TAB) 10 MG (21) TBPK tablet Take as directed on package (Patient not taking: Reported on 10/22/2016) 21 tablet 0  . triamcinolone cream (KENALOG) 0.1 % Apply topically 2 (two) times daily. (Patient not taking: Reported on 10/22/2016) 30 g 0   No current facility-administered medications on file prior to visit.    Allergies  Allergen Reactions  . Permethrin Rash   Family History  Problem Relation Age of Onset  . Diabetes Mother   . Deep vein thrombosis Mother        Varicose Veins  . Cancer Father        Father died from brain tumor.  [name] Heart  disease Father   . Colon cancer Neg Hx   . Stomach cancer Neg Hx    PE: BP 140/80   Pulse 78   Ht 5' 10.5" (1.791 m)   Wt 189 lb (85.7 kg)   SpO2 97%   BMI 26.74 kg/m  Body mass index is 26.74 kg/m. Wt Readings from Last 3 Encounters:  10/22/16 189 lb (85.7 kg)  08/14/16 191 lb (86.6 kg)  02/29/16 189 lb (85.7 kg)   Constitutional: overweight, in NAD Eyes: PERRLA, EOMI,  no exophthalmos ENT: moist mucous membranes, no thyroid masses felt, no cervical lymphadenopathy Cardiovascular: RRR, No MRG Respiratory: CTA B Gastrointestinal: abdomen soft, NT, ND, BS+ Musculoskeletal: no deformities, strength intact in all 4 Skin: moist, warm, no rashes Neurological: no tremor with outstretched hands, DTR normal in all 4  ASSESSMENT: 1. Hypothyroidism  2. Distant history of subcentimeter papillary thyroid cancer - She did not require radioactive iodine treatment, only total thyroidectomy  PLAN:  1. Patient with long-standing hypothyroidism, on levothyroxine therapy. She returns after a long absence during which her hypothyroidism has been managed by her PCP. She decided to return to see me after her latest TSH returned low. She does remember being on a Prednisone taper then, but the TSH has been decreasing in the previous year. - latest thyroid labs reviewed with pt >> TSH 0.21 - she continues on LT4 112 mcg daily - we discussed about taking the thyroid hormone every day, with water, >30 minutes before breakfast, separated by >4 hours from acid reflux medications, calcium, iron, multivitamins. Pt. is taking it correctly, except drinks coffee + creamer <30 min after LT4 >> advised her to separate them more. - will check thyroid tests today: TSH and fT4 >> if TSH low >> will decrease the LT4 dose to 100 mcg daily. We discussed that she does not need a suppressed TSH despite her h/o ThyCa as she is not high risk and she is beyond the 1st year after surgery. - If labs are abnormal, she will  need to return for repeat TFTs in 1.5 months - OTW, RTC in 1 year  2. Distant history of PTC - She has an incidentally found subcentimeter focus of PTC at the time of her total thyroidectomy 1997. She had no recurring masses or any neck compression symptoms, however, she complains about dry foods sticking in her throat in the last few months - lately little better . She also has acid reflux for which he takes Tums occasionally.  -  I would consider her cured after her thyroidectomy but if she continues have dysphagia, we will need to repeat the thyroid ultrasound  Component     Latest Ref Rng & Units 10/22/2016  TSH     0.35 - 4.50 uIU/mL 0.27 (L)  T4,Free(Direct)     0.60 - 1.60 ng/dL 0.86  TSH is slightly better now off the prednisone. Will need to decrease the levothyroxine dose to 100 g daily and have her back for labs in 1.5 months. Philemon Kingdom, MD PhD [facility] Endocr[facility]

## 2016-11-19 ENCOUNTER — Ambulatory Visit: Payer: BC Managed Care – PPO | Admitting: Internal Medicine

## 2016-11-19 ENCOUNTER — Other Ambulatory Visit (INDEPENDENT_AMBULATORY_CARE_PROVIDER_SITE_OTHER): Payer: Medicare Other

## 2016-11-19 DIAGNOSIS — E890 Postprocedural hypothyroidism: Secondary | ICD-10-CM

## 2016-11-19 LAB — TSH: TSH: 1.23 u[IU]/mL (ref 0.35–4.50)

## 2016-11-19 LAB — T4, FREE: Free T4: 0.69 ng/dL (ref 0.60–1.60)

## 2016-12-06 ENCOUNTER — Ambulatory Visit (INDEPENDENT_AMBULATORY_CARE_PROVIDER_SITE_OTHER): Payer: Medicare Other | Admitting: Internal Medicine

## 2016-12-06 ENCOUNTER — Encounter: Payer: Self-pay | Admitting: Internal Medicine

## 2016-12-06 VITALS — BP 130/86 | HR 77 | Temp 98.3°F | Ht 70.5 in | Wt 192.0 lb

## 2016-12-06 DIAGNOSIS — J029 Acute pharyngitis, unspecified: Secondary | ICD-10-CM | POA: Diagnosis not present

## 2016-12-06 DIAGNOSIS — R1310 Dysphagia, unspecified: Secondary | ICD-10-CM | POA: Diagnosis not present

## 2016-12-06 LAB — POCT RAPID STREP A (OFFICE): RAPID STREP A SCREEN: NEGATIVE

## 2016-12-06 MED ORDER — RANITIDINE HCL 300 MG PO TABS: 300.0000 mg | ORAL_TABLET | Freq: Every day | ORAL | 3 refills | Status: DC

## 2016-12-06 NOTE — Progress Notes (Signed)
   Subjective:    Patient ID: Theresa Perez, female    DOB: September 22, 1950, 66 y.o.   MRN: [id number]  HPI The patient is a 66 YO female coming in for sore throat. Going on several days. Her granddaughter had strep throat and she is concerned that this could be as well. She is a current smoker. Denies significant allergy symptoms currently. Denies cough. Overall feeling better. Some nasal drainage.   Review of Systems  Constitutional: Negative.   HENT: Positive for congestion, postnasal drip and sore throat. Negative for dental problem, drooling, nosebleeds, rhinorrhea, sinus pain, sinus pressure, trouble swallowing and voice change.   Eyes: Negative.   Respiratory: Negative for cough, chest tightness and shortness of breath.   Cardiovascular: Negative for chest pain, palpitations and leg swelling.  Gastrointestinal: Negative for abdominal distention, abdominal pain, constipation, diarrhea, nausea and vomiting.      Objective:   Physical Exam  Constitutional: She is oriented to person, place, and time. She appears well-developed and well-nourished.  HENT:  Head: Normocephalic and atraumatic.  Oropharynx with redness and clear drainage. No purulent discharge.   Eyes: EOM are normal.  Neck: Normal range of motion. No thyromegaly present.  Cardiovascular: Normal rate and regular rhythm.   Pulmonary/Chest: Effort normal and breath sounds normal. No respiratory distress. She has no wheezes. She has no rales.  Abdominal: Soft. Bowel sounds are normal. She exhibits no distension. There is no tenderness. There is no rebound.  Musculoskeletal: She exhibits no edema.  Lymphadenopathy:    She has no cervical adenopathy.  Neurological: She is alert and oriented to person, place, and time. Coordination normal.  Skin: Skin is warm and dry.  Psychiatric: She has a normal mood and affect.   Vitals:   12/06/16 0853  BP: 130/86  Pulse: 77  Temp: 98.3 F (36.8 C)  TempSrc: Oral  SpO2: 99%    Weight: 192 lb (87.1 kg)  Height: 5' 10.5" (1.791 m)   Rapid strep test: negative    Assessment & Plan:

## 2016-12-06 NOTE — Assessment & Plan Note (Signed)
Likely from GERD. Rx for zantac qhs.

## 2016-12-06 NOTE — Patient Instructions (Signed)
The strep test is negative so it is okay to use the cold medicine.   We have sent in zantac to take in the evening for the acid reflux. If you are still having symptoms you can take twice a day. Give it 1-2 months after good control to help with the swallowing.

## 2016-12-06 NOTE — Assessment & Plan Note (Signed)
Strep test negative in the office and centor criteria not positive. Likely allergies and no indication for antibiotics or steroids today. Continue otc sinus treatment.

## 2017-01-16 ENCOUNTER — Other Ambulatory Visit: Payer: Self-pay | Admitting: Internal Medicine

## 2017-01-16 DIAGNOSIS — J069 Acute upper respiratory infection, unspecified: Secondary | ICD-10-CM

## 2017-03-26 ENCOUNTER — Telehealth: Payer: Self-pay

## 2017-03-26 DIAGNOSIS — Z0000 Encounter for general adult medical examination without abnormal findings: Secondary | ICD-10-CM

## 2017-03-26 NOTE — Telephone Encounter (Signed)
Copied from [place] [id number]. Topic: General - Other >> Mar 26, 2017 11:37 AM Yvette Rack wrote: Reason for CRM: patient has a physical on 05-26-17 with Sharlet Salina pt need lab orders in for that day

## 2017-03-28 ENCOUNTER — Other Ambulatory Visit: Payer: Self-pay | Admitting: Internal Medicine

## 2017-03-31 NOTE — Telephone Encounter (Signed)
Patient notified states that she may just get them on her appointment date

## 2017-03-31 NOTE — Telephone Encounter (Signed)
Placed.

## 2017-04-01 ENCOUNTER — Encounter: Payer: Self-pay | Admitting: Family Medicine

## 2017-04-01 ENCOUNTER — Ambulatory Visit: Payer: Medicare Other | Admitting: Family Medicine

## 2017-04-01 VITALS — BP 134/78 | HR 73 | Temp 98.6°F | Ht 70.5 in | Wt 190.0 lb

## 2017-04-01 DIAGNOSIS — M654 Radial styloid tenosynovitis [de Quervain]: Secondary | ICD-10-CM | POA: Insufficient documentation

## 2017-04-01 NOTE — Assessment & Plan Note (Addendum)
Has a small amount of inflammation of the first dorsal compartment near the radial styloid. Is not significant throughout the tendon sheath. [place] joint appears to be normal and second dorsal compartment looks normal as well. - penNSAID - Counseled on home exercise therapy - Counseled on icing - If no improvement in 4-6 weeks in follow-up and consider an injection versus physical therapy.

## 2017-04-01 NOTE — Patient Instructions (Addendum)
You can take ibuprofen or Motrin for the pain. Please try the exercises and using ice. There is no improvement in 4-6 weeks then follow-up and we can consider an injection.

## 2017-04-01 NOTE — Progress Notes (Signed)
Theresa Perez - 67 y.o. female MRN [id number]  Date of birth: 1950-11-20  SUBJECTIVE:  Including CC & ROS.  Chief Complaint  Patient presents with  . Right wrist pain     Theresa Perez is a 67 y.o. female that is presenting with right wrist pain. Pain has been ongoing for one month. Pain is acute in nature. Denies injury or trauma. Pain is located on the radial aspect, described as intermittent. admits to swelling and tenderness.  She stays active playing tennis and exercising at the gym daily.  It is localized to this area. She is left-handed. She does take care of her grandchild from time to time.   Review of Systems  Constitutional: Negative for fever.  Respiratory: Negative for cough.   Cardiovascular: Negative for chest pain.  Musculoskeletal: Negative for arthralgias.  Skin: Negative for color change.  Neurological: Negative for weakness.  Hematological: Negative for adenopathy.  Psychiatric/Behavioral: Negative for agitation.    HISTORY: Past Medical, Surgical, Social, and Family History Reviewed & Updated per EMR.   Pertinent Historical Findings include:  Past Medical History:  Diagnosis Date  . Allergy   . Anxiety   . 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 Complication of anesthesia   . Cyst of breast, right, diffuse fibrocystic   . Hypertension   . Hypothyroidism   . PONV (postoperative nausea and vomiting)     Past Surgical History:  Procedure Laterality Date  . ANTERIOR AND POSTERIOR REPAIR N/A 03/31/2012   Procedure: ANTERIOR (CYSTOCELE) AND POSTERIOR REPAIR (RECTOCELE);  Surgeon: Gus Height, MD;  Location: [facility];  Service: Gynecology;  Laterality: N/A;  . aspiration cyst right breast    . THYROIDECTOMY  1997  . TONSILLECTOMY  1969  . uterine ablation    . VAGINAL HYSTERECTOMY N/A 03/31/2012   Procedure: HYSTERECTOMY VAGINAL;  Surgeon: Gus Height, MD;  Location: [facility];  Service: Gynecology;  Laterality: N/A;    Allergies  Allergen Reactions  .  Permethrin Rash    Family History  Problem Relation Age of Onset  . Diabetes Mother   . Deep vein thrombosis Mother        Varicose Veins  . Cancer Father        Father died from brain tumor.  [name] Heart disease Father   . Colon cancer Neg Hx   . Stomach cancer Neg Hx      Social History   Socioeconomic History  . Marital status: Married    Spouse name: Not on file  . Number of children: Not on file  . Years of education: Not on file  . Highest education level: Not on file  Social Needs  . Financial resource strain: Not on file  . Food insecurity - worry: Not on file  . Food insecurity - inability: Not on file  . Transportation needs - medical: Not on file  . Transportation needs - non-medical: Not on file  Occupational History  . Not on file  Tobacco Use  . Smoking status: Current Every Day Smoker    Packs/day: 0.30    Years: 15.00    Pack years: 4.50    Types: Cigarettes  . Smokeless tobacco: Never Used  . Tobacco comment: Occassional smoker  Substance and Sexual Activity  . Alcohol use: Yes    Comment: Rarely  . Drug use: No  . Sexual activity: Yes    Birth control/protection: Post-menopausal  Other Topics Concern  . Not on file  Social History  Narrative  . Not on file     PHYSICAL EXAM:  VS: BP 134/78 (BP Location: Left Arm, Patient Position: Sitting, Cuff Size: Normal)   Pulse 73   Temp 98.6 F (37 C) (Oral)   Ht 5' 10.5" (1.791 m)   Wt 190 lb (86.2 kg)   SpO2 98%   BMI 26.88 kg/m  Physical Exam Gen: NAD, alert, cooperative with exam, well-appearing ENT: normal lips, normal nasal mucosa,  Eye: normal EOM, normal conjunctiva and lids CV:  no edema, +2 pedal pulses   Resp: no accessory muscle use, non-labored,  Skin: no rashes, no areas of induration  Neuro: normal tone, normal sensation to touch Psych:  normal insight, alert and oriented MSK:  Right wrist: Swelling on the radial side of the wrist Tenderness to palpation of the first dorsal  compartment. No tenderness to palpation over the [facility] joint. Normal thumb extension and opposition. Normal pincer grasp. Normal wrist range of motion. Positive Finkelstein's test. Neurovascularly intact  Limited ultrasound: Right wrist:  First dorsal compartment with a small amount of hypoechoic change to suggest a tenosynovitis Normal-appearing CMC joint. Normal appearing second dorsal compartment  Summary: De Quervain's tenosynovitis  Ultrasound and interpretation by Clearance Coots, MD         ASSESSMENT & PLAN:   Harriet Pho disease (radial styloid tenosynovitis) Has a small amount of inflammation of the first dorsal compartment near the radial styloid. Is not significant throughout the tendon sheath. [place] joint appears to be normal and second dorsal compartment looks normal as well. - penNSAID - Counseled on home exercise therapy - Counseled on icing - If no improvement in 4-6 weeks in follow-up and consider an injection versus physical therapy.

## 2017-04-14 ENCOUNTER — Other Ambulatory Visit: Payer: Self-pay | Admitting: Internal Medicine

## 2017-04-14 DIAGNOSIS — Z1231 Encounter for screening mammogram for malignant neoplasm of breast: Secondary | ICD-10-CM

## 2017-05-11 ENCOUNTER — Other Ambulatory Visit: Payer: Self-pay | Admitting: Internal Medicine

## 2017-05-11 ENCOUNTER — Other Ambulatory Visit: Payer: Self-pay | Admitting: Nurse Practitioner

## 2017-05-11 DIAGNOSIS — J069 Acute upper respiratory infection, unspecified: Secondary | ICD-10-CM

## 2017-05-11 DIAGNOSIS — F4322 Adjustment disorder with anxiety: Secondary | ICD-10-CM

## 2017-05-19 ENCOUNTER — Ambulatory Visit
Admission: RE | Admit: 2017-05-19 | Discharge: 2017-05-19 | Disposition: A | Discharge status: Home / Self Care | Payer: Medicare Other | Source: Ambulatory Visit | Attending: Internal Medicine | Admitting: Internal Medicine

## 2017-05-19 DIAGNOSIS — Z1231 Encounter for screening mammogram for malignant neoplasm of breast: Secondary | ICD-10-CM

## 2017-05-20 ENCOUNTER — Other Ambulatory Visit: Payer: Self-pay | Admitting: Internal Medicine

## 2017-05-20 DIAGNOSIS — R928 Other abnormal and inconclusive findings on diagnostic imaging of breast: Secondary | ICD-10-CM

## 2017-05-23 ENCOUNTER — Other Ambulatory Visit: Payer: Self-pay | Admitting: Internal Medicine

## 2017-05-23 ENCOUNTER — Ambulatory Visit
Admission: RE | Admit: 2017-05-23 | Discharge: 2017-05-23 | Disposition: A | Discharge status: Home / Self Care | Payer: Medicare Other | Source: Ambulatory Visit | Attending: Internal Medicine | Admitting: Internal Medicine

## 2017-05-23 DIAGNOSIS — R921 Mammographic calcification found on diagnostic imaging of breast: Secondary | ICD-10-CM

## 2017-05-23 DIAGNOSIS — R928 Other abnormal and inconclusive findings on diagnostic imaging of breast: Secondary | ICD-10-CM

## 2017-05-26 ENCOUNTER — Encounter: Payer: Self-pay | Admitting: Internal Medicine

## 2017-05-26 ENCOUNTER — Other Ambulatory Visit (INDEPENDENT_AMBULATORY_CARE_PROVIDER_SITE_OTHER): Payer: Medicare Other

## 2017-05-26 ENCOUNTER — Ambulatory Visit (INDEPENDENT_AMBULATORY_CARE_PROVIDER_SITE_OTHER): Payer: Medicare Other | Admitting: Internal Medicine

## 2017-05-26 VITALS — BP 110/80 | HR 80 | Temp 97.9°F | Ht 70.5 in | Wt 188.0 lb

## 2017-05-26 DIAGNOSIS — Z1211 Encounter for screening for malignant neoplasm of colon: Secondary | ICD-10-CM | POA: Diagnosis not present

## 2017-05-26 DIAGNOSIS — I10 Essential (primary) hypertension: Secondary | ICD-10-CM | POA: Diagnosis not present

## 2017-05-26 DIAGNOSIS — E2839 Other primary ovarian failure: Secondary | ICD-10-CM

## 2017-05-26 DIAGNOSIS — E890 Postprocedural hypothyroidism: Secondary | ICD-10-CM

## 2017-05-26 DIAGNOSIS — E7800 Pure hypercholesterolemia, unspecified: Secondary | ICD-10-CM | POA: Diagnosis not present

## 2017-05-26 DIAGNOSIS — Z0000 Encounter for general adult medical examination without abnormal findings: Secondary | ICD-10-CM

## 2017-05-26 DIAGNOSIS — Z23 Encounter for immunization: Secondary | ICD-10-CM | POA: Diagnosis not present

## 2017-05-26 LAB — LIPID PANEL
Cholesterol: 140 mg/dL (ref 0–200)
HDL: 36.2 mg/dL — ABNORMAL LOW
LDL Cholesterol: 86 mg/dL (ref 0–99)
NonHDL: 103.38
Total CHOL/HDL Ratio: 4
Triglycerides: 88 mg/dL (ref 0.0–149.0)
VLDL: 17.6 mg/dL (ref 0.0–40.0)

## 2017-05-26 LAB — COMPREHENSIVE METABOLIC PANEL WITH GFR
ALT: 34 U/L (ref 0–35)
AST: 32 U/L (ref 0–37)
Albumin: 4.2 g/dL (ref 3.5–5.2)
Alkaline Phosphatase: 52 U/L (ref 39–117)
BUN: 14 mg/dL (ref 6–23)
CO2: 31 meq/L (ref 19–32)
Calcium: 8.8 mg/dL (ref 8.4–10.5)
Chloride: 101 meq/L (ref 96–112)
Creatinine, Ser: 0.8 mg/dL (ref 0.40–1.20)
GFR: 76.36 mL/min
Glucose, Bld: 85 mg/dL (ref 70–99)
Potassium: 3.7 meq/L (ref 3.5–5.1)
Sodium: 141 meq/L (ref 135–145)
Total Bilirubin: 0.4 mg/dL (ref 0.2–1.2)
Total Protein: 7.3 g/dL (ref 6.0–8.3)

## 2017-05-26 LAB — CBC
HCT: 41.1 % (ref 36.0–46.0)
HEMOGLOBIN: 14.2 g/dL (ref 12.0–15.0)
MCHC: 34.5 g/dL (ref 30.0–36.0)
MCV: 89.6 fl (ref 78.0–100.0)
PLATELETS: 289 10*3/uL (ref 150.0–400.0)
RBC: 4.59 Mil/uL (ref 3.87–5.11)
RDW: 13 % (ref 11.5–15.5)
WBC: 6.2 10*3/uL (ref 4.0–10.5)

## 2017-05-26 LAB — VITAMIN D 25 HYDROXY (VIT D DEFICIENCY, FRACTURES): VITD: 31.06 ng/mL (ref 30.00–100.00)

## 2017-05-26 LAB — TSH: TSH: 8.46 u[IU]/mL — ABNORMAL HIGH (ref 0.35–4.50)

## 2017-05-26 LAB — T4, FREE: Free T4: 0.63 ng/dL (ref 0.60–1.60)

## 2017-05-26 MED ORDER — ZOSTER VAC RECOMB ADJUVANTED 50 MCG/0.5ML IM SUSR: 0.5000 mL | Freq: Once | INTRAMUSCULAR | 1 refills | Status: AC

## 2017-05-26 NOTE — Assessment & Plan Note (Signed)
BP at goal on her atenolol/chlorthalidone. Checking CMP and adjust as needed.

## 2017-05-26 NOTE — Assessment & Plan Note (Signed)
Checking TSH and free T4 and adjust as needed. Seen endo in the meantime and dose decreased to 100 mcg daily.

## 2017-05-26 NOTE — Assessment & Plan Note (Signed)
Cologuard ordered, mammogram needs biopsy which she is getting within the week, dexa ordered. Pneumonia 23 given. Rx for shingrix given today. Declines hiv screening. Counseled about sun safety and mole surveillance. Given 10 year screening recommendations.

## 2017-05-26 NOTE — Progress Notes (Signed)
   Subjective:    Patient ID: Theresa Perez, female    DOB: 1950-09-19, 67 y.o.   MRN: [id number]  HPI Here for welcome to medicare visit, no new complaints. Please see A/P for status and treatment of chronic medical problems.   Diet: heart healthy Physical activity: sedentary, some exercise Depression/mood screen: negative Hearing: intact to whispered voice Visual acuity: grossly normal, performs annual eye exam, see vision screening  ADLs: capable Fall risk: none Home safety: good Cognitive evaluation: intact to orientation, naming, recall and repetition EOL planning: adv directives discussed  I have personally reviewed and have noted 1. The patient's medical and social history - reviewed today no changes 2. Their use of alcohol, tobacco or illicit drugs 3. Their current medications and supplements 4. The patient's functional ability including ADL's, fall risks, home safety risks and hearing or visual impairment. 5. Diet and physical activities 6. Evidence for depression or mood disorders 7. Care team reviewed and updated (available in snapshot)  Review of Systems  Constitutional: Negative.   HENT: Negative.   Eyes: Negative.   Respiratory: Negative for cough, chest tightness and shortness of breath.   Cardiovascular: Negative for chest pain, palpitations and leg swelling.  Gastrointestinal: Negative for abdominal distention, abdominal pain, constipation, diarrhea, nausea and vomiting.  Musculoskeletal: Negative.   Skin: Negative.   Neurological: Negative.   Psychiatric/Behavioral: Negative.       Objective:   Physical Exam  Constitutional: She is oriented to person, place, and time. She appears well-developed and well-nourished.  HENT:  Head: Normocephalic and atraumatic.  Eyes: EOM are normal.  Neck: Normal range of motion.  Cardiovascular: Normal rate and regular rhythm.  Pulmonary/Chest: Effort normal and breath sounds normal. No respiratory distress. She has  no wheezes. She has no rales.  Abdominal: Soft. Bowel sounds are normal. She exhibits no distension. There is no tenderness. There is no rebound.  Musculoskeletal: She exhibits no edema.  Neurological: She is alert and oriented to person, place, and time. Coordination normal.  Skin: Skin is warm and dry.  Psychiatric: She has a normal mood and affect.   Vitals:   05/26/17 0832  BP: 110/80  Pulse: 80  Temp: 97.9 F (36.6 C)  TempSrc: Oral  SpO2: 97%  Weight: 188 lb (85.3 kg)  Height: 5' 10.5" (1.791 m)      Assessment & Plan:  Pneumonia 23 given at visit

## 2017-05-26 NOTE — Assessment & Plan Note (Signed)
Checking lipid panel and adjust as needed.  

## 2017-05-26 NOTE — Patient Instructions (Signed)
We have given the pneumonia shot to you. The shingles we have given you to take to the pharmacy.   We have ordered the cologuard.   Health Maintenance, Female Adopting a healthy lifestyle and getting preventive care can go a long way to promote health and wellness. Talk with your health care provider about what schedule of regular examinations is right for you. This is a good chance for you to check in with your provider about disease prevention and staying healthy. In between checkups, there are plenty of things you can do on your own. Experts have done a lot of research about which lifestyle changes and preventive measures are most likely to keep you healthy. Ask your health care provider for more information. Weight and diet Eat a healthy diet  Be sure to include plenty of vegetables, fruits, low-fat dairy products, and lean protein.  Do not eat a lot of foods high in solid fats, added sugars, or salt.  Get regular exercise. This is one of the most important things you can do for your health. ? Most adults should exercise for at least 150 minutes each week. The exercise should increase your heart rate and make you sweat (moderate-intensity exercise). ? Most adults should also do strengthening exercises at least twice a week. This is in addition to the moderate-intensity exercise.  Maintain a healthy weight  Body mass index (BMI) is a measurement that can be used to identify possible weight problems. It estimates body fat based on height and weight. Your health care provider can help determine your BMI and help you achieve or maintain a healthy weight.  For females 34 years of age and older: ? A BMI below 18.5 is considered underweight. ? A BMI of 18.5 to 24.9 is normal. ? A BMI of 25 to 29.9 is considered overweight. ? A BMI of 30 and above is considered obese.  Watch levels of cholesterol and blood lipids  You should start having your blood tested for lipids and cholesterol at 65  years of age, then have this test every 5 years.  You may need to have your cholesterol levels checked more often if: ? Your lipid or cholesterol levels are high. ? You are older than 66 years of age. ? You are at high risk for heart disease.  Cancer screening Lung Cancer  Lung cancer screening is recommended for adults 57-25 years old who are at high risk for lung cancer because of a history of smoking.  A yearly low-dose CT scan of the lungs is recommended for people who: ? Currently smoke. ? Have quit within the past 15 years. ? Have at least a 30-pack-year history of smoking. A pack year is smoking an average of one pack of cigarettes a day for 1 year.  Yearly screening should continue until it has been 15 years since you quit.  Yearly screening should stop if you develop a health problem that would prevent you from having lung cancer treatment.  Breast Cancer  Practice breast self-awareness. This means understanding how your breasts normally appear and feel.  It also means doing regular breast self-exams. Let your health care provider know about any changes, no matter how small.  If you are in your 20s or 30s, you should have a clinical breast exam (CBE) by a health care provider every 1-3 years as part of a regular health exam.  If you are 77 or older, have a CBE every year. Also consider having a breast X-ray (mammogram)  every year.  If you have a family history of breast cancer, talk to your health care provider about genetic screening.  If you are at high risk for breast cancer, talk to your health care provider about having an MRI and a mammogram every year.  Breast cancer gene (BRCA) assessment is recommended for women who have family members with BRCA-related cancers. BRCA-related cancers include: ? Breast. ? Ovarian. ? Tubal. ? Peritoneal cancers.  Results of the assessment will determine the need for genetic counseling and BRCA1 and BRCA2 testing.  Cervical  Cancer Your health care provider may recommend that you be screened regularly for cancer of the pelvic organs (ovaries, uterus, and vagina). This screening involves a pelvic examination, including checking for microscopic changes to the surface of your cervix (Pap test). You may be encouraged to have this screening done every 3 years, beginning at age 45.  For women ages 53-65, health care providers may recommend pelvic exams and Pap testing every 3 years, or they may recommend the Pap and pelvic exam, combined with testing for human papilloma virus (HPV), every 5 years. Some types of HPV increase your risk of cervical cancer. Testing for HPV may also be done on women of any age with unclear Pap test results.  Other health care providers may not recommend any screening for nonpregnant women who are considered low risk for pelvic cancer and who do not have symptoms. Ask your health care provider if a screening pelvic exam is right for you.  If you have had past treatment for cervical cancer or a condition that could lead to cancer, you need Pap tests and screening for cancer for at least 20 years after your treatment. If Pap tests have been discontinued, your risk factors (such as having a new sexual partner) need to be reassessed to determine if screening should resume. Some women have medical problems that increase the chance of getting cervical cancer. In these cases, your health care provider may recommend more frequent screening and Pap tests.  Colorectal Cancer  This type of cancer can be detected and often prevented.  Routine colorectal cancer screening usually begins at 66 years of age and continues through 66 years of age.  Your health care provider may recommend screening at an earlier age if you have risk factors for colon cancer.  Your health care provider may also recommend using home test kits to check for hidden blood in the stool.  A small camera at the end of a tube can be used to  examine your colon directly (sigmoidoscopy or colonoscopy). This is done to check for the earliest forms of colorectal cancer.  Routine screening usually begins at age 74.  Direct examination of the colon should be repeated every 5-10 years through 66 years of age. However, you may need to be screened more often if early forms of precancerous polyps or small growths are found.  Skin Cancer  Check your skin from head to toe regularly.  Tell your health care provider about any new moles or changes in moles, especially if there is a change in a mole's shape or color.  Also tell your health care provider if you have a mole that is larger than the size of a pencil eraser.  Always use sunscreen. Apply sunscreen liberally and repeatedly throughout the day.  Protect yourself by wearing long sleeves, pants, a wide-brimmed hat, and sunglasses whenever you are outside.  Heart disease, diabetes, and high blood pressure  High blood pressure causes  heart disease and increases the risk of stroke. High blood pressure is more likely to develop in: ? People who have blood pressure in the high end of the normal range (130-139/85-89 mm Hg). ? People who are overweight or obese. ? People who are African American.  If you are 51-35 years of age, have your blood pressure checked every 3-5 years. If you are 45 years of age or older, have your blood pressure checked every year. You should have your blood pressure measured twice-once when you are at a hospital or clinic, and once when you are not at a hospital or clinic. Record the average of the two measurements. To check your blood pressure when you are not at a hospital or clinic, you can use: ? An automated blood pressure machine at a pharmacy. ? A home blood pressure monitor.  If you are between 108 years and 69 years old, ask your health care provider if you should take aspirin to prevent strokes.  Have regular diabetes screenings. This involves taking a  blood sample to check your fasting blood sugar level. ? If you are at a normal weight and have a low risk for diabetes, have this test once every three years after 66 years of age. ? If you are overweight and have a high risk for diabetes, consider being tested at a younger age or more often. Preventing infection Hepatitis B  If you have a higher risk for hepatitis B, you should be screened for this virus. You are considered at high risk for hepatitis B if: ? You were born in a country where hepatitis B is common. Ask your health care provider which countries are considered high risk. ? Your parents were born in a high-risk country, and you have not been immunized against hepatitis B (hepatitis B vaccine). ? You have HIV or AIDS. ? You use needles to inject street drugs. ? You live with someone who has hepatitis B. ? You have had sex with someone who has hepatitis B. ? You get hemodialysis treatment. ? You take certain medicines for conditions, including cancer, organ transplantation, and autoimmune conditions.  Hepatitis C  Blood testing is recommended for: ? Everyone born from [birth year] through 1964. ? Anyone with known risk factors for hepatitis C.  Sexually transmitted infections (STIs)  You should be screened for sexually transmitted infections (STIs) including gonorrhea and chlamydia if: ? You are sexually active and are younger than 66 years of age. ? You are older than 66 years of age and your health care provider tells you that you are at risk for this type of infection. ? Your sexual activity has changed since you were last screened and you are at an increased risk for chlamydia or gonorrhea. Ask your health care provider if you are at risk.  If you do not have HIV, but are at risk, it may be recommended that you take a prescription medicine daily to prevent HIV infection. This is called pre-exposure prophylaxis (PrEP). You are considered at risk if: ? You are sexually active and  do not regularly use condoms or know the HIV status of your partner(s). ? You take drugs by injection. ? You are sexually active with a partner who has HIV.  Talk with your health care provider about whether you are at high risk of being infected with HIV. If you choose to begin PrEP, you should first be tested for HIV. You should then be tested every 3 months for as long as  you are taking PrEP. Pregnancy  If you are premenopausal and you may become pregnant, ask your health care provider about preconception counseling.  If you may become pregnant, take 400 to 800 micrograms (mcg) of folic acid every day.  If you want to prevent pregnancy, talk to your health care provider about birth control (contraception). Osteoporosis and menopause  Osteoporosis is a disease in which the bones lose minerals and strength with aging. This can result in serious bone fractures. Your risk for osteoporosis can be identified using a bone density scan.  If you are 77 years of age or older, or if you are at risk for osteoporosis and fractures, ask your health care provider if you should be screened.  Ask your health care provider whether you should take a calcium or vitamin D supplement to lower your risk for osteoporosis.  Menopause may have certain physical symptoms and risks.  Hormone replacement therapy may reduce some of these symptoms and risks. Talk to your health care provider about whether hormone replacement therapy is right for you. Follow these instructions at home:  Schedule regular health, dental, and eye exams.  Stay current with your immunizations.  Do not use any tobacco products including cigarettes, chewing tobacco, or electronic cigarettes.  If you are pregnant, do not drink alcohol.  If you are breastfeeding, limit how much and how often you drink alcohol.  Limit alcohol intake to no more than 1 drink per day for nonpregnant women. One drink equals 12 ounces of beer, 5 ounces of  wine, or 1 ounces of hard liquor.  Do not use street drugs.  Do not share needles.  Ask your health care provider for help if you need support or information about quitting drugs.  Tell your health care provider if you often feel depressed.  Tell your health care provider if you have ever been abused or do not feel safe at home. This information is not intended to replace advice given to you by your health care provider. Make sure you discuss any questions you have with your health care provider. Document Released: 08/20/2010 Document Revised: 07/13/2015 Document Reviewed: 11/08/2014 Elsevier Interactive Patient Education  [employer].

## 2017-05-27 ENCOUNTER — Ambulatory Visit
Admission: RE | Admit: 2017-05-27 | Discharge: 2017-05-27 | Disposition: A | Discharge status: Home / Self Care | Payer: Medicare Other | Source: Ambulatory Visit | Attending: Internal Medicine | Admitting: Internal Medicine

## 2017-05-27 DIAGNOSIS — R921 Mammographic calcification found on diagnostic imaging of breast: Secondary | ICD-10-CM

## 2017-06-03 ENCOUNTER — Ambulatory Visit (INDEPENDENT_AMBULATORY_CARE_PROVIDER_SITE_OTHER)
Admission: RE | Admit: 2017-06-03 | Discharge: 2017-06-03 | Disposition: A | Discharge status: Home / Self Care | Payer: Medicare Other | Source: Ambulatory Visit | Attending: Internal Medicine | Admitting: Internal Medicine

## 2017-06-03 DIAGNOSIS — E2839 Other primary ovarian failure: Secondary | ICD-10-CM

## 2017-06-09 ENCOUNTER — Encounter: Payer: Self-pay | Admitting: Internal Medicine

## 2017-06-10 ENCOUNTER — Other Ambulatory Visit: Payer: Self-pay | Admitting: *Deleted

## 2017-06-10 DIAGNOSIS — E890 Postprocedural hypothyroidism: Secondary | ICD-10-CM

## 2017-06-10 MED ORDER — LEVOTHYROXINE SODIUM 100 MCG PO TABS: 100.0000 ug | ORAL_TABLET | Freq: Every day | ORAL | 5 refills | Status: DC

## 2017-06-24 ENCOUNTER — Other Ambulatory Visit: Payer: Self-pay | Admitting: Nurse Practitioner

## 2017-06-24 ENCOUNTER — Ambulatory Visit: Payer: Medicare Other | Admitting: Family

## 2017-06-24 ENCOUNTER — Encounter: Payer: Self-pay | Admitting: Family

## 2017-06-24 VITALS — BP 122/82 | HR 85 | Temp 98.4°F | Ht 70.5 in | Wt 190.1 lb

## 2017-06-24 DIAGNOSIS — J019 Acute sinusitis, unspecified: Secondary | ICD-10-CM | POA: Diagnosis not present

## 2017-06-24 DIAGNOSIS — I10 Essential (primary) hypertension: Secondary | ICD-10-CM

## 2017-06-24 MED ORDER — AMOXICILLIN-POT CLAVULANATE 875-125 MG PO TABS: 1.0000 | ORAL_TABLET | Freq: Two times a day (BID) | ORAL | 0 refills | Status: DC

## 2017-06-24 NOTE — Progress Notes (Signed)
Theresa Perez is a 67 y.o. female with the following history as recorded in Epic[employer]:  Patient Active Problem List   Diagnosis Date Noted  . De Quervain's disease (radial styloid tenosynovitis) 04/01/2017  . Dysphagia 12/06/2016  . Headache 08/14/2016  . Arthritis of left knee 02/22/2015  . Routine general medical examination at a health care facility 12/16/2014  . Constipation 07/13/2014  . Papillary thyroid carcinoma ([place]) 03/23/2014  . Pure hypercholesterolemia 12/08/2013  . Varicose veins of lower extremities with other complications 42/35/3614  . ADJUSTMENT DISORDER WITH ANXIETY 03/03/2009  . Hypothyroidism 05/19/2007  . Essential hypertension 05/19/2007    Current Outpatient Medications  Medication Sig Dispense Refill  . Ascorbic Acid (VITAMIN C PO) Take 1 tablet by mouth daily.    [name] atenolol-chlorthalidone (TENORETIC) 50-25 MG tablet TAKE 1/2 (ONE-HALF) TABLET BY MOUTH ONCE DAILY 45 tablet 1  . Black Cohosh 540 MG CAPS Take 540 mg by mouth 2 (two) times daily.     . butalbital-acetaminophen-caffeine (ESGIC) 50-325-40 MG tablet Take 1 tablet by mouth 2 (two) times daily as needed for headache. 30 tablet 1  . CALCIUM PO Take 1 tablet by mouth daily.    [name] COENZYME Q-10 PO Take 1 tablet by mouth daily.    . Cyanocobalamin (VITAMIN B-12 PO) Take 1 tablet by mouth daily.    . DULoxetine (CYMBALTA) 60 MG capsule TAKE 1 CAPSULE BY MOUTH TWICE DAILY 180 capsule 1  . ECHINACEA EXTRACT PO Take by mouth.    . ECHINACEA PO Take 1 tablet by mouth daily.    . ferrous sulfate 325 (65 FE) MG tablet Take 325 mg by mouth every evening.     . fluticasone (FLONASE) 50 MCG/ACT nasal spray USE 2 SPRAY(S) IN EACH NOSTRIL ONCE DAILY 16 g 1  . Ginkgo Biloba (GNP GINGKO BILOBA EXTRACT PO) Take 1 tablet by mouth daily.    [name] levothyroxine (SYNTHROID, LEVOTHROID) 100 MCG tablet Take 1 tablet (100 mcg total) by mouth daily. 45 tablet 5  . Misc Natural Products (GLUCOSAMINE CHOND COMPLEX/MSM) TABS Take 1  tablet by mouth 2 (two) times daily.    . Multiple Vitamins-Minerals (MULTIVITAMIN WITH MINERALS) tablet Take 1 tablet by mouth daily.    . Nutritional Supplements (DHEA PO) Take 1 tablet by mouth daily.    . ranitidine (ZANTAC) 300 MG tablet Take 1 tablet (300 mg total) by mouth at bedtime. 90 tablet 3  . amoxicillin-clavulanate (AUGMENTIN) 875-125 MG tablet Take 1 tablet by mouth 2 (two) times daily. 20 tablet 0   No current facility-administered medications for this visit.     Allergies: Permethrin  Past Medical History:  Diagnosis Date  . Allergy   . Anxiety   . Cancer ([place])    Thyroid-Papillary  . Complication of anesthesia   . Cyst of breast, right, diffuse fibrocystic   . Hypertension   . Hypothyroidism   . PONV (postoperative nausea and vomiting)     Past Surgical History:  Procedure Laterality Date  . ANTERIOR AND POSTERIOR REPAIR N/A 03/31/2012   Procedure: ANTERIOR (CYSTOCELE) AND POSTERIOR REPAIR (RECTOCELE);  Surgeon: Gus Height, MD;  Location: [facility];  Service: Gynecology;  Laterality: N/A;  . aspiration cyst right breast    . THYROIDECTOMY  1997  . TONSILLECTOMY  1969  . uterine ablation    . VAGINAL HYSTERECTOMY N/A 03/31/2012   Procedure: HYSTERECTOMY VAGINAL;  Surgeon: Gus Height, MD;  Location: [facility];  Service: Gynecology;  Laterality: N/A;    Family History  Problem Relation Age of Onset  . Diabetes Mother   . Deep vein thrombosis Mother        Varicose Veins  . Cancer Father        Father died from brain tumor.  [name] Heart disease Father   . Colon cancer Neg Hx   . Stomach cancer Neg Hx     Social History   Tobacco Use  . Smoking status: Current Every Day Smoker    Packs/day: 0.30    Years: 15.00    Pack years: 4.50    Types: Cigarettes  . Smokeless tobacco: Never Used  . Tobacco comment: Occassional smoker  Substance Use Topics  . Alcohol use: Yes    Comment: Rarely    Subjective:  Patient presents with concerns for sinus infection; notes  she has been using her Flonase and OTC allergy medication to help with symptoms; seemed to worsen in the past few days; + sore throat; + "green mucus." No fever, no chest pain; + smoker;   Objective:  Vitals:   06/24/17 0929  BP: 122/82  Pulse: 85  Temp: 98.4 F (36.9 C)  TempSrc: Oral  SpO2: 96%  Weight: 190 lb 1.3 oz (86.2 kg)  Height: 5' 10.5" (1.791 m)    General: Well developed, well nourished, in no acute distress  Skin : Warm and dry.  Head: Normocephalic and atraumatic  Eyes: Sclera and conjunctiva clear; pupils round and reactive to light; extraocular movements intact  Ears: External normal; canals clear; tympanic membranes normal  Oropharynx: Pink, supple. No suspicious lesions  Neck: Supple without thyromegaly, adenopathy  Lungs: Respirations unlabored; clear to auscultation bilaterally without wheeze, rales, rhonchi  CVS exam: normal rate and regular rhythm.  Neurologic: Alert and oriented; speech intact; face symmetrical; moves all extremities well; CNII-XII intact without focal deficit   Assessment:  1. Acute sinusitis, recurrence not specified, unspecified location     Plan:  Suspect allergic component; Rx for Augmentin 875 mg bid x 10 days; encouraged to quit smoking; increase fluids, rest and follow-up worse, no better.   No follow-ups on file.  No orders of the defined types were placed in this encounter.   Requested Prescriptions   Signed Prescriptions Disp Refills  . amoxicillin-clavulanate (AUGMENTIN) 875-125 MG tablet 20 tablet 0    Sig: Take 1 tablet by mouth 2 (two) times daily.

## 2017-08-10 ENCOUNTER — Other Ambulatory Visit: Payer: Self-pay | Admitting: Internal Medicine

## 2017-08-10 DIAGNOSIS — J069 Acute upper respiratory infection, unspecified: Secondary | ICD-10-CM

## 2017-10-16 ENCOUNTER — Other Ambulatory Visit: Payer: Self-pay | Admitting: Internal Medicine

## 2017-10-16 DIAGNOSIS — J069 Acute upper respiratory infection, unspecified: Secondary | ICD-10-CM

## 2017-10-20 ENCOUNTER — Other Ambulatory Visit: Payer: Self-pay | Admitting: Internal Medicine

## 2017-10-20 DIAGNOSIS — F4322 Adjustment disorder with anxiety: Secondary | ICD-10-CM

## 2017-10-23 ENCOUNTER — Encounter: Payer: Self-pay | Admitting: Internal Medicine

## 2017-10-23 ENCOUNTER — Encounter: Payer: Self-pay | Admitting: Family Medicine

## 2017-10-23 ENCOUNTER — Ambulatory Visit: Payer: Medicare Other | Admitting: Family Medicine

## 2017-10-23 ENCOUNTER — Ambulatory Visit: Payer: Medicare Other | Admitting: Internal Medicine

## 2017-10-23 ENCOUNTER — Other Ambulatory Visit: Payer: Self-pay | Admitting: Internal Medicine

## 2017-10-23 VITALS — BP 128/86 | HR 87 | Temp 98.1°F | Ht 70.5 in | Wt 189.0 lb

## 2017-10-23 VITALS — BP 128/86 | HR 87 | Temp 98.1°F | Resp 18 | Wt 189.0 lb

## 2017-10-23 DIAGNOSIS — E890 Postprocedural hypothyroidism: Secondary | ICD-10-CM | POA: Diagnosis not present

## 2017-10-23 DIAGNOSIS — M25561 Pain in right knee: Secondary | ICD-10-CM

## 2017-10-23 DIAGNOSIS — R1310 Dysphagia, unspecified: Secondary | ICD-10-CM

## 2017-10-23 DIAGNOSIS — C73 Malignant neoplasm of thyroid gland: Secondary | ICD-10-CM

## 2017-10-23 LAB — T4, FREE: FREE T4: 0.67 ng/dL (ref 0.60–1.60)

## 2017-10-23 LAB — TSH: TSH: 5.89 u[IU]/mL — ABNORMAL HIGH (ref 0.35–4.50)

## 2017-10-23 MED ORDER — DICLOFENAC SODIUM 75 MG PO TBEC: 75.0000 mg | DELAYED_RELEASE_TABLET | Freq: Two times a day (BID) | ORAL | 1 refills | Status: DC | PRN

## 2017-10-23 NOTE — Patient Instructions (Addendum)
Please continue levothyroxine 100 mcg daily.  Take the thyroid hormone every day, with water, at least 30 minutes before breakfast, separated by at least 4 hours from: - acid reflux medications - calcium - iron - multivitamins  Please stop at the lab.  Please return in 1 year.  

## 2017-10-23 NOTE — Patient Instructions (Addendum)
Good to see you again.  Please try compression on the knee  Please try to ice the knee after playing tennis  Take tylenol 650 mg three times a day is the best evidence based medicine we have for arthritis.  Glucosamine sulfate 750mg  twice a day is a supplement that has been shown to help moderate to severe arthritis. Vitamin D 2000 IU daily Fish oil 2 grams daily.  Tumeric 500mg  twice daily.  Capsaicin topically up to four times a day may also help with pain. Cortisone injections are an option if these interventions do not seem to make a difference or need more relief.  Please see [place] back if you have pain that worsens.

## 2017-10-23 NOTE — Progress Notes (Signed)
Theresa Perez - 67 y.o. female MRN [id number]  Date of birth: 07/04/1950  SUBJECTIVE:  Including CC & ROS.  Chief Complaint  Patient presents with  . Cyst    Theresa Perez is a 67 y.o. female that is here today for right knee pain. Has pain in the back of the knee. Pain is worse with playing tennis. Pain is moderate and localized to the knee. Has been increasing over the past three months.   Review of Systems  Constitutional: Negative for fever.  HENT: Negative for congestion.   Respiratory: Negative for cough.   Cardiovascular: Negative for chest pain.  Gastrointestinal: Negative for abdominal pain.  Musculoskeletal: Negative for gait problem.  Skin: Negative for color change.  Neurological: Negative for weakness.  Hematological: Negative for adenopathy.  Psychiatric/Behavioral: Negative for agitation.    HISTORY: Past Medical, Surgical, Social, and Family History Reviewed & Updated per EMR.   Pertinent Historical Findings include:  Past Medical History:  Diagnosis Date  . Allergy   . Anxiety   . Cancer ([place])    Thyroid-Papillary  . Complication of anesthesia   . Cyst of breast, right, diffuse fibrocystic   . Hypertension   . Hypothyroidism   . PONV (postoperative nausea and vomiting)     Past Surgical History:  Procedure Laterality Date  . ANTERIOR AND POSTERIOR REPAIR N/A 03/31/2012   Procedure: ANTERIOR (CYSTOCELE) AND POSTERIOR REPAIR (RECTOCELE);  Surgeon: Gus Height, MD;  Location: [facility];  Service: Gynecology;  Laterality: N/A;  . aspiration cyst right breast    . THYROIDECTOMY  1997  . TONSILLECTOMY  1969  . uterine ablation    . VAGINAL HYSTERECTOMY N/A 03/31/2012   Procedure: HYSTERECTOMY VAGINAL;  Surgeon: Gus Height, MD;  Location: [facility];  Service: Gynecology;  Laterality: N/A;    Allergies  Allergen Reactions  . Permethrin Rash    Family History  Problem Relation Age of Onset  . Diabetes Mother   . Deep vein thrombosis Mother    Varicose Veins  . Cancer Father        Father died from brain tumor.  [name] Heart disease Father   . Colon cancer Neg Hx   . Stomach cancer Neg Hx      Social History   Socioeconomic History  . Marital status: Married    Spouse name: Not on file  . Number of children: Not on file  . Years of education: Not on file  . Highest education level: Not on file  Occupational History  . Not on file  Social Needs  . Financial resource strain: Not on file  . Food insecurity:    Worry: Not on file    Inability: Not on file  . Transportation needs:    Medical: Not on file    Non-medical: Not on file  Tobacco Use  . Smoking status: Current Every Day Smoker    Packs/day: 0.30    Years: 15.00    Pack years: 4.50    Types: Cigarettes  . Smokeless tobacco: Never Used  . Tobacco comment: Occassional smoker  Substance and Sexual Activity  . Alcohol use: Yes    Comment: Rarely  . Drug use: No  . Sexual activity: Yes    Birth control/protection: Post-menopausal  Lifestyle  . Physical activity:    Days per week: Not on file    Minutes per session: Not on file  . Stress: Not on file  Relationships  . Social connections:    Talks  on phone: Not on file    Gets together: Not on file    Attends religious service: Not on file    Active member of club or organization: Not on file    Attends meetings of clubs or organizations: Not on file    Relationship status: Not on file  . Intimate partner violence:    Fear of current or ex partner: Not on file    Emotionally abused: Not on file    Physically abused: Not on file    Forced sexual activity: Not on file  Other Topics Concern  . Not on file  Social History Narrative  . Not on file     PHYSICAL EXAM:  VS: BP 128/86   Pulse 87   Temp 98.1 F (36.7 C) (Oral)   Ht 5' 10.5" (1.791 m)   Wt 189 lb (85.7 kg)   SpO2 98%   BMI 26.74 kg/m  Physical Exam Gen: NAD, alert, cooperative with exam, well-appearing ENT: normal lips, normal  nasal mucosa,  Eye: normal EOM, normal conjunctiva and lids CV:  no edema, +2 pedal pulses   Resp: no accessory muscle use, non-labored,  Skin: no rashes, no areas of induration  Neuro: normal tone, normal sensation to touch Psych:  normal insight, alert and oriented MSK:  Right knee:  Mild effusion  No TTP of the medial or lateral joint line  Pain in the posterior aspect overlying the distal biceps femoris  Normal ROM  Normal strength to resistance with flexion and extension  No instability  Normal patellar grind  Negative McMurray's test  Neurovascularly intact   Limited ultrasound: right knee:  No baker's cyst  Mild effusion  Medial joint space narrowing moderate and outpouching of the medial meniscus   Summary: moderate to severe degenerative changes in the medial joint line.   Ultrasound and interpretation by Clearance Coots, MD       ASSESSMENT & PLAN:   Acute pain of right knee Possible for hamstring issue with pain posteriorly but no loss of strength. Does have degenerative changes seen on US of the medial compartment  - counseled on supportive care - diclofenac  - counseled on HEP  - if no improvement consider PT vs injection and imaging.

## 2017-10-23 NOTE — Progress Notes (Addendum)
Patient ID: Theresa Perez, female   DOB: [birth year], 67 y.o.   MRN: [id number]   HPI  Theresa Perez is a 67 y.o.-year-old female, returning for follow-up for postsurgical hypothyroidism and distant history of papillary thyroid cancer.  Last visit a year ago.  Reviewed and addended history: Pt. has been dx with goiter 1990s >> thyroidectomy in 1997 >> hypothyroidism; She was then found to have a 1/16 inch papillary thyroid cancer focus then. 2 LNs were negative. No RAI Tx needed.  Patient was initially on high doses of levothyroxine for very long period of time.  We started to decrease the doses of levothyroxine after our first visit, last dose decreased 10/2016.  A subsequent TSH was normal after the decreasing dose.  However, latest TSH from 5 months ago was high.  Pt is on levothyroxine 100 Mcg daily, taken: - in the middle of the night  - moved since last visit - fasting - coffee and creamer in am - at least 60 min from b'fast - + Ca, Fe, MVI, since last visit  - started Zantac at night  - not on Biotin  Reviewed patient's TFTs: Lab Results  Component Value Date   TSH 8.46 (H) 05/26/2017   TSH 1.23 11/19/2016   TSH 0.27 (L) 10/22/2016   TSH 0.21 (L) 08/14/2016   TSH 0.57 12/26/2015   TSH 1.92 06/16/2015   TSH 4.07 05/15/2015   TSH 4.36 01/27/2015   TSH 0.56 09/13/2014   TSH 0.16 (L) 07/22/2014   FREET4 0.63 05/26/2017   FREET4 0.69 11/19/2016   FREET4 0.86 10/22/2016   FREET4 0.69 08/14/2016   FREET4 0.84 12/26/2015   FREET4 0.68 06/16/2015   FREET4 0.68 05/15/2015   FREET4 0.60 01/27/2015   FREET4 0.81 09/13/2014   FREET4 0.72 07/22/2014   Previously: Lab Results  Component Value Date   TSH 0.04* 11/04/2012   TSH 0.06* 10/29/2012   TSH 0.03* 10/25/2011   TSH 0.04* 10/10/2010   TSH 0.04* 09/15/2009   TSH 0.012 02/25/2009   TSH 0.009  02/24/2009   TSH 0.02* 02/13/2009   FREET4 1.41 11/04/2012   FREET4 1.41 01/05/2010   FREET4 2.09* 02/25/2009   FREET4  2.18* 02/24/2009   FREET4 1.9* 02/13/2009    Pt denies: - feeling nodules in neck - hoarseness - choking - SOB with lying down But she does have continued to have mild dysphagia with dry foods.  ROS: Constitutional: no weight gain/no weight loss, no fatigue, no subjective hyperthermia, no subjective hypothermia Eyes: no blurry vision, no xerophthalmia ENT: no sore throat, + see HPI Cardiovascular: no CP/no SOB/no palpitations/no leg swelling Respiratory: no cough/no SOB/no wheezing Gastrointestinal: no N/no V/no D/no C/+ acid reflux Musculoskeletal: no muscle aches/no joint aches Skin: no rashes, no hair loss Neurological: no tremors/no numbness/no tingling/no dizziness  I reviewed pt's medications, allergies, PMH, social hx, family hx, and changes were documented in the history of present illness. Otherwise, unchanged from my initial visit note.  Past Medical History:  Diagnosis Date  . Allergy   . Anxiety   . Cancer ([employer])    Thyroid-Papillary  . Complication of anesthesia   . Cyst of breast, right, diffuse fibrocystic   . Hypertension   . Hypothyroidism   . PONV (postoperative nausea and vomiting)    Past Surgical History:  Procedure Laterality Date  . ANTERIOR AND POSTERIOR REPAIR N/A 03/31/2012   Procedure: ANTERIOR (CYSTOCELE) AND POSTERIOR REPAIR (RECTOCELE);  Surgeon: Gus Height, MD;  Location: [facility];  Service: Gynecology;  Laterality: N/A;  . aspiration cyst right breast    . THYROIDECTOMY  1997  . TONSILLECTOMY  1969  . uterine ablation    . VAGINAL HYSTERECTOMY N/A 03/31/2012   Procedure: HYSTERECTOMY VAGINAL;  Surgeon: Gus Height, MD;  Location: [facility];  Service: Gynecology;  Laterality: N/A;   History   Social History  . Marital Status: Married    Spouse Name: N/A    Number of Children: 1   Occupational History  . retired   Social History Main Topics  . Smoking status: Current Every Day Smoker -- 0.5 packs/day for 15 years    Types: Cigarettes  .  Smokeless tobacco: Never Used     Comment: Occassional smoker  . Alcohol Use: Yes     Comment: Rarely  . Drug Use: No   Current Outpatient Medications on File Prior to Visit  Medication Sig Dispense Refill  . amoxicillin-clavulanate (AUGMENTIN) 875-125 MG tablet Take 1 tablet by mouth 2 (two) times daily. 20 tablet 0  . Ascorbic Acid (VITAMIN C PO) Take 1 tablet by mouth daily.    [name] atenolol-chlorthalidone (TENORETIC) 50-25 MG tablet TAKE 1/2 (ONE-HALF) TABLET BY MOUTH ONCE DAILY 45 tablet 1  . Black Cohosh 540 MG CAPS Take 540 mg by mouth 2 (two) times daily.     . butalbital-acetaminophen-caffeine (ESGIC) 50-325-40 MG tablet Take 1 tablet by mouth 2 (two) times daily as needed for headache. 30 tablet 1  . CALCIUM PO Take 1 tablet by mouth daily.    [name] COENZYME Q-10 PO Take 1 tablet by mouth daily.    . Cyanocobalamin (VITAMIN B-12 PO) Take 1 tablet by mouth daily.    . DULoxetine (CYMBALTA) 60 MG capsule TAKE 1 CAPSULE BY MOUTH TWICE DAILY 180 capsule 1  . ECHINACEA EXTRACT PO Take by mouth.    . ECHINACEA PO Take 1 tablet by mouth daily.    . ferrous sulfate 325 (65 FE) MG tablet Take 325 mg by mouth every evening.     . fluticasone (FLONASE) 50 MCG/ACT nasal spray USE 2 SPRAY(S) IN EACH NOSTRIL ONCE DAILY 16 g 1  . Ginkgo Biloba (GNP GINGKO BILOBA EXTRACT PO) Take 1 tablet by mouth daily.    [name] levothyroxine (SYNTHROID, LEVOTHROID) 100 MCG tablet Take 1 tablet (100 mcg total) by mouth daily. 45 tablet 5  . Misc Natural Products (GLUCOSAMINE CHOND COMPLEX/MSM) TABS Take 1 tablet by mouth 2 (two) times daily.    . Multiple Vitamins-Minerals (MULTIVITAMIN WITH MINERALS) tablet Take 1 tablet by mouth daily.    . Nutritional Supplements (DHEA PO) Take 1 tablet by mouth daily.    . ranitidine (ZANTAC) 300 MG tablet Take 1 tablet (300 mg total) by mouth at bedtime. 90 tablet 3   No current facility-administered medications on file prior to visit.    Allergies  Allergen Reactions  .  Permethrin Rash   Family History  Problem Relation Age of Onset  . Diabetes Mother   . Deep vein thrombosis Mother        Varicose Veins  . Cancer Father        Father died from brain tumor.  [name] Heart disease Father   . Colon cancer Neg Hx   . Stomach cancer Neg Hx    PE: BP 128/86   Pulse 87   Temp 98.1 F (36.7 C) (Oral)   Resp 18   Wt 189 lb (85.7 kg)   SpO2 98%   BMI 26.74 kg/m  Body mass index is 26.74 kg/m. Wt Readings from Last 3 Encounters:  10/23/17 189 lb (85.7 kg)  06/24/17 190 lb 1.3 oz (86.2 kg)  05/26/17 188 lb (85.3 kg)   Constitutional: overweight, in NAD Eyes: PERRLA, EOMI, no exophthalmos ENT: moist mucous membranes, no thyroid masses felt, No cervical lymphadenopathy Cardiovascular: RRR, No MRG Respiratory: CTA B Gastrointestinal: abdomen soft, NT, ND, BS+ Musculoskeletal: no deformities, strength intact in all 4 Skin: moist, warm, no rashes Neurological: no tremor with outstretched hands, DTR normal in all 4  ASSESSMENT: 1. Hypothyroidism  2. Distant history of subcentimeter papillary thyroid cancer - She did not require radioactive iodine treatment  3.  Dysphagia  PLAN:  1. Patient with long-standing hypothyroidism, on levothyroxine therapy. - latest thyroid labs reviewed with pt >> TSH slightly high, at 8.46 on 05/2017, as checked by her PCP.  At that time, her dose of levothyroxine was not changed.  We discussed that the increase may have been due to adding Zantac at night and then taking levothyroxine in the middle of the night rather than in the morning.  I advised her that if the labs are still abnormal, she may need to go back to taking the levothyroxine in the morning. - she continues on LT4 100 mcg daily - pt feels good on this dose. - we discussed about taking the thyroid hormone every day, with water, >30 minutes before breakfast, separated by >4 hours from acid reflux medications, calcium, iron, multivitamins. Pt. is taking it  correctly except as mentioned above. - will check thyroid tests today: TSH and fT4 - If labs are abnormal, she will need to return for repeat TFTs in 1.5 months  2. Distant history of PTC -She has an incidentally found subcentimeter focus of papillary thyroid cancer at the time of her total thyroidectomy 1997.  She had no recurring masses or any neck compression symptoms, however, she occasionally has dry food sticking in her throat.  She does have acid reflux for which she takes Tums occasionally. -We can consider her cured after her thyroidectomy, but since she continues to have dysphagia, will need to repeat a neck ultrasound.  3.  Dysphagia -Mostly to dry foods, still bothersome -I doubt that she has a recurrence of her papillary thyroid cancer, but will check a neck ultrasound now -If this is normal, we will go ahead with a barium swallow -She agrees with the plan  Office Visit on 10/23/2017  Component Date Value Ref Range Status  . TSH 10/23/2017 5.89* 0.35 - 4.50 uIU/mL Final  . Free T4 10/23/2017 0.67  0.60 - 1.60 ng/dL Final   Comment: Specimens from patients who are undergoing biotin therapy and /or ingesting biotin supplements may contain high levels of biotin.  The higher biotin concentration in these specimens interferes with this Free T4 assay.  Specimens that contain high levels  of biotin may cause false high results for this Free T4 assay.  Please interpret results in light of the total clinical presentation of the patient.     TSH is still elevated.  I advised the patient to move levothyroxine in the morning and will recheck her TFTs in 1.5 months.  Thyroid ultrasound (11/14/2017): Post total thyroidectomy without evidence of residual or locally recurrent disease.   Philemon Kingdom, MD PhD [facility] Endocrinology

## 2017-10-24 DIAGNOSIS — M25561 Pain in right knee: Secondary | ICD-10-CM | POA: Insufficient documentation

## 2017-10-24 NOTE — Assessment & Plan Note (Signed)
Possible for hamstring issue with pain posteriorly but no loss of strength. Does have degenerative changes seen on US of the medial compartment  - counseled on supportive care - diclofenac  - counseled on HEP  - if no improvement consider PT vs injection and imaging.

## 2017-10-29 ENCOUNTER — Encounter: Payer: Self-pay | Admitting: Internal Medicine

## 2017-10-29 ENCOUNTER — Ambulatory Visit: Payer: Medicare Other | Admitting: Internal Medicine

## 2017-10-29 DIAGNOSIS — H00015 Hordeolum externum left lower eyelid: Secondary | ICD-10-CM | POA: Diagnosis not present

## 2017-10-29 DIAGNOSIS — H00019 Hordeolum externum unspecified eye, unspecified eyelid: Secondary | ICD-10-CM | POA: Insufficient documentation

## 2017-10-29 NOTE — Patient Instructions (Signed)
Do a warm compress a couple times of day.    Nothing you can will hurt this or keep it from healing.

## 2017-10-29 NOTE — Assessment & Plan Note (Signed)
Advised conservative measures for the stye. Talked to her about complications of cellulitis and how to watch for this and call back for red rash or worsening symptoms and seek care for vision changes.

## 2017-10-29 NOTE — Progress Notes (Signed)
   Subjective:    Patient ID: Theresa Perez, female    DOB: 1951/01/03, 67 y.o.   MRN: [id number]  HPI The patient is a 67 YO female coming in for eye swelling. Started 1-2 days ago. In the left eye. Some redness near the middle and swelling. She is having some pain in the area especially with blinking. She denies fevers or chills. She is having mild worsening since onset. Did not know what to try so did not try anything. She denies blurred vision or double vision. Denies vision loss.   Review of Systems  Constitutional: Negative.   HENT: Negative.   Eyes: Positive for pain.       Swelling left eyelid  Respiratory: Negative for cough, chest tightness and shortness of breath.   Cardiovascular: Negative for chest pain, palpitations and leg swelling.  Gastrointestinal: Negative for abdominal distention, abdominal pain, constipation, diarrhea, nausea and vomiting.  Musculoskeletal: Negative.   Skin: Negative.   Neurological: Negative.       Objective:   Physical Exam  Constitutional: She is oriented to person, place, and time. She appears well-developed and well-nourished.  HENT:  Head: Normocephalic and atraumatic.  Eyes: EOM are normal.  Stye on the left lower eyelid, no signs of cellulitis or surrounding edema.   Neck: Normal range of motion.  Cardiovascular: Normal rate and regular rhythm.  Pulmonary/Chest: Effort normal and breath sounds normal. No respiratory distress. She has no wheezes. She has no rales.  Musculoskeletal: She exhibits no edema.  Neurological: She is alert and oriented to person, place, and time. Coordination normal.  Skin: Skin is warm and dry.   Vitals:   10/29/17 1057  BP: 140/84  Pulse: 76  Temp: 98.6 F (37 C)  TempSrc: Oral  SpO2: 95%  Weight: 190 lb (86.2 kg)  Height: 5' 10.5" (1.791 m)      Assessment & Plan:

## 2017-11-07 ENCOUNTER — Ambulatory Visit: Payer: Medicare Other | Admitting: Family

## 2017-11-07 ENCOUNTER — Encounter: Payer: Self-pay | Admitting: Family

## 2017-11-07 VITALS — BP 122/80 | HR 92 | Temp 97.8°F | Ht 70.5 in | Wt 189.6 lb

## 2017-11-07 DIAGNOSIS — L03115 Cellulitis of right lower limb: Secondary | ICD-10-CM | POA: Diagnosis not present

## 2017-11-07 MED ORDER — DOXYCYCLINE HYCLATE 100 MG PO TABS: 100.0000 mg | ORAL_TABLET | Freq: Two times a day (BID) | ORAL | 0 refills | Status: DC

## 2017-11-07 NOTE — Progress Notes (Signed)
Theresa Perez is a 67 y.o. female with the following history as recorded in [employer]:  Patient Active Problem List   Diagnosis Date Noted  . Stye external 10/29/2017  . Acute pain of right knee 10/24/2017  . De Quervain's disease (radial styloid tenosynovitis) 04/01/2017  . Dysphagia 12/06/2016  . Headache 08/14/2016  . Arthritis of left knee 02/22/2015  . Routine general medical examination at a health care facility 12/16/2014  . Constipation 07/13/2014  . Papillary thyroid carcinoma ([place]) 03/23/2014  . Pure hypercholesterolemia 12/08/2013  . Varicose veins of lower extremities with other complications 96/29/5284  . ADJUSTMENT DISORDER WITH ANXIETY 03/03/2009  . Hypothyroidism 05/19/2007  . Essential hypertension 05/19/2007    Current Outpatient Medications  Medication Sig Dispense Refill  . Ascorbic Acid (VITAMIN C PO) Take 1 tablet by mouth daily.    [name] atenolol-chlorthalidone (TENORETIC) 50-25 MG tablet TAKE 1/2 (ONE-HALF) TABLET BY MOUTH ONCE DAILY 45 tablet 1  . Black Cohosh 540 MG CAPS Take 540 mg by mouth 2 (two) times daily.     . butalbital-acetaminophen-caffeine (ESGIC) 50-325-40 MG tablet Take 1 tablet by mouth 2 (two) times daily as needed for headache. 30 tablet 1  . CALCIUM PO Take 1 tablet by mouth daily.    [name] COENZYME Q-10 PO Take 1 tablet by mouth daily.    . Cyanocobalamin (VITAMIN B-12 PO) Take 1 tablet by mouth daily.    . diclofenac (VOLTAREN) 75 MG EC tablet Take 1 tablet (75 mg total) by mouth 2 (two) times daily as needed. 30 tablet 1  . DULoxetine (CYMBALTA) 60 MG capsule TAKE 1 CAPSULE BY MOUTH TWICE DAILY 180 capsule 1  . ECHINACEA EXTRACT PO Take by mouth.    . ECHINACEA PO Take 1 tablet by mouth daily.    . ferrous sulfate 325 (65 FE) MG tablet Take 325 mg by mouth every evening.     . fluticasone (FLONASE) 50 MCG/ACT nasal spray USE 2 SPRAY(S) IN EACH NOSTRIL ONCE DAILY 16 g 1  . Ginkgo Biloba (GNP GINGKO BILOBA EXTRACT PO) Take 1 tablet by mouth  daily.    [name] levothyroxine (SYNTHROID, LEVOTHROID) 100 MCG tablet Take 1 tablet (100 mcg total) by mouth daily. 45 tablet 5  . Misc Natural Products (GLUCOSAMINE CHOND COMPLEX/MSM) TABS Take 1 tablet by mouth 2 (two) times daily.    . Multiple Vitamins-Minerals (MULTIVITAMIN WITH MINERALS) tablet Take 1 tablet by mouth daily.    . Nutritional Supplements (DHEA PO) Take 1 tablet by mouth daily.    . ranitidine (ZANTAC) 300 MG tablet Take 1 tablet (300 mg total) by mouth at bedtime. 90 tablet 3  . doxycycline (VIBRA-TABS) 100 MG tablet Take 1 tablet (100 mg total) by mouth 2 (two) times daily. 14 tablet 0   No current facility-administered medications for this visit.     Allergies: Permethrin  Past Medical History:  Diagnosis Date  . Allergy   . Anxiety   . Cancer ([place])    Thyroid-Papillary  . Complication of anesthesia   . Cyst of breast, right, diffuse fibrocystic   . Hypertension   . Hypothyroidism   . PONV (postoperative nausea and vomiting)     Past Surgical History:  Procedure Laterality Date  . ANTERIOR AND POSTERIOR REPAIR N/A 03/31/2012   Procedure: ANTERIOR (CYSTOCELE) AND POSTERIOR REPAIR (RECTOCELE);  Surgeon: Gus Height, MD;  Location: [facility];  Service: Gynecology;  Laterality: N/A;  . aspiration cyst right breast    . THYROIDECTOMY  1997  .  TONSILLECTOMY  1969  . uterine ablation    . VAGINAL HYSTERECTOMY N/A 03/31/2012   Procedure: HYSTERECTOMY VAGINAL;  Surgeon: Gus Height, MD;  Location: [facility];  Service: Gynecology;  Laterality: N/A;    Family History  Problem Relation Age of Onset  . Diabetes Mother   . Deep vein thrombosis Mother        Varicose Veins  . Cancer Father        Father died from brain tumor.  [name] Heart disease Father   . Colon cancer Neg Hx   . Stomach cancer Neg Hx     Social History   Tobacco Use  . Smoking status: Current Every Day Smoker    Packs/day: 0.30    Years: 15.00    Pack years: 4.50    Types: Cigarettes  . Smokeless tobacco:  Never Used  . Tobacco comment: Occassional smoker  Substance Use Topics  . Alcohol use: Yes    Comment: Rarely    Subjective:  2 week history of wound on lower right leg; injured on steps outside of home; has been treating with Neosporin- "just not healing." No fever; does feel some pain with walking;   Objective:  Vitals:   11/07/17 1311  BP: 122/80  Pulse: 92  Temp: 97.8 F (36.6 C)  TempSrc: Oral  SpO2: 97%  Weight: 189 lb 9.6 oz (86 kg)  Height: 5' 10.5" (1.791 m)    General: Well developed, well nourished, in no acute distress  Skin : Warm and dry. cellulitis lower right leg Head: Normocephalic and atraumatic  Lungs: Respirations unlabored; Vessels: Symmetric bilaterally  Neurologic: Alert and oriented; speech intact; face symmetrical; moves all extremities well; CNII-XII intact without focal deficit   Assessment:  1. Cellulitis of right lower extremity     Plan:  Tdap up to date; Rx for Doxycycline 100 mg bid x 7 days; continue Neosporin at night; follow-up worse, no better.   No follow-ups on file.  No orders of the defined types were placed in this encounter.   Requested Prescriptions   Signed Prescriptions Disp Refills  . doxycycline (VIBRA-TABS) 100 MG tablet 14 tablet 0    Sig: Take 1 tablet (100 mg total) by mouth 2 (two) times daily.

## 2017-11-14 ENCOUNTER — Ambulatory Visit
Admission: RE | Admit: 2017-11-14 | Discharge: 2017-11-14 | Disposition: A | Discharge status: Home / Self Care | Payer: Medicare Other | Source: Ambulatory Visit | Attending: Internal Medicine | Admitting: Internal Medicine

## 2017-11-14 ENCOUNTER — Telehealth: Payer: Self-pay | Admitting: Family

## 2017-11-14 DIAGNOSIS — C73 Malignant neoplasm of thyroid gland: Secondary | ICD-10-CM

## 2017-11-14 DIAGNOSIS — R1310 Dysphagia, unspecified: Secondary | ICD-10-CM

## 2017-11-14 MED ORDER — DOXYCYCLINE HYCLATE 100 MG PO TABS: 100.0000 mg | ORAL_TABLET | Freq: Two times a day (BID) | ORAL | 0 refills | Status: AC

## 2017-11-14 NOTE — Telephone Encounter (Signed)
I am going to extend for 3 more days;

## 2017-11-14 NOTE — Telephone Encounter (Signed)
Copied from [place] [id number]. Topic: Inquiry >> Nov 14, 2017  1:37 PM Margot Ables wrote: Pt states there is still a bit of pinkness in her lower right leg and is requesting a 2nd round of doxycycline (VIBRA-TABS) 100 MG tablet from Jodi Mourning, NP. Please advise pt  [facility] ([address]  - [place] [phone] (Phone) [phone] (Fax)

## 2017-11-14 NOTE — Telephone Encounter (Signed)
Called and left message that script had been extended for 3 more days.

## 2017-12-02 ENCOUNTER — Encounter: Payer: Self-pay | Admitting: Internal Medicine

## 2017-12-03 ENCOUNTER — Other Ambulatory Visit: Payer: Self-pay | Admitting: Internal Medicine

## 2017-12-03 DIAGNOSIS — R1310 Dysphagia, unspecified: Secondary | ICD-10-CM

## 2017-12-04 ENCOUNTER — Other Ambulatory Visit (INDEPENDENT_AMBULATORY_CARE_PROVIDER_SITE_OTHER): Payer: Medicare Other

## 2017-12-04 DIAGNOSIS — E890 Postprocedural hypothyroidism: Secondary | ICD-10-CM

## 2017-12-04 LAB — T4, FREE: Free T4: 0.8 ng/dL (ref 0.60–1.60)

## 2017-12-04 LAB — TSH: TSH: 1.67 u[IU]/mL (ref 0.35–4.50)

## 2017-12-08 ENCOUNTER — Other Ambulatory Visit: Payer: Self-pay | Admitting: Internal Medicine

## 2017-12-11 ENCOUNTER — Ambulatory Visit: Payer: Medicare Other | Admitting: Internal Medicine

## 2017-12-11 ENCOUNTER — Encounter: Payer: Self-pay | Admitting: Internal Medicine

## 2017-12-11 VITALS — BP 134/84 | HR 81 | Temp 99.3°F | Resp 16 | Ht 70.5 in | Wt 190.0 lb

## 2017-12-11 DIAGNOSIS — J410 Simple chronic bronchitis: Secondary | ICD-10-CM | POA: Insufficient documentation

## 2017-12-11 DIAGNOSIS — B349 Viral infection, unspecified: Secondary | ICD-10-CM

## 2017-12-11 DIAGNOSIS — R05 Cough: Secondary | ICD-10-CM

## 2017-12-11 HISTORY — DX: Simple chronic bronchitis: J41.0

## 2017-12-11 LAB — POCT INFLUENZA A/B
Influenza A, POC: NEGATIVE
Influenza B, POC: NEGATIVE

## 2017-12-11 NOTE — Progress Notes (Signed)
Subjective:    Patient ID: Theresa Perez, female    DOB: 1950/11/22, 67 y.o.   MRN: [id number]  HPI She is here for an acute visit for cold symptoms.  Her symptoms started yesterday  She is experiencing chills, nasal congestion, mild ear pain, runny nose, mild sinus pressure, sneezing, sore throat, dry cough and body aches.  She has not noticed any fevers, shortness of breath, wheezing, nausea, diarrhea, headaches or lightheadedness.  She has taken nighttime severe cold and flu medication and daytime version this morning.    Medications and allergies reviewed with patient and updated if appropriate.  Patient Active Problem List   Diagnosis Date Noted  . Stye external 10/29/2017  . Acute pain of right knee 10/24/2017  . De Quervain's disease (radial styloid tenosynovitis) 04/01/2017  . Dysphagia 12/06/2016  . Headache 08/14/2016  . Arthritis of left knee 02/22/2015  . Routine general medical examination at a health care facility 12/16/2014  . Constipation 07/13/2014  . Papillary thyroid carcinoma ([place]) 03/23/2014  . Pure hypercholesterolemia 12/08/2013  . Varicose veins of lower extremities with other complications 07/37/1062  . ADJUSTMENT DISORDER WITH ANXIETY 03/03/2009  . Hypothyroidism 05/19/2007  . Essential hypertension 05/19/2007    Current Outpatient Medications on File Prior to Visit  Medication Sig Dispense Refill  . Ascorbic Acid (VITAMIN C PO) Take 1 tablet by mouth daily.    [name] atenolol-chlorthalidone (TENORETIC) 50-25 MG tablet TAKE 1/2 (ONE-HALF) TABLET BY MOUTH ONCE DAILY 45 tablet 1  . Black Cohosh 540 MG CAPS Take 540 mg by mouth 2 (two) times daily.     . butalbital-acetaminophen-caffeine (ESGIC) 50-325-40 MG tablet Take 1 tablet by mouth 2 (two) times daily as needed for headache. 30 tablet 1  . CALCIUM PO Take 1 tablet by mouth daily.    [name] COENZYME Q-10 PO Take 1 tablet by mouth daily.    . Cyanocobalamin (VITAMIN B-12 PO) Take 1 tablet by mouth  daily.    . diclofenac (VOLTAREN) 75 MG EC tablet Take 1 tablet (75 mg total) by mouth 2 (two) times daily as needed. 30 tablet 1  . DULoxetine (CYMBALTA) 60 MG capsule TAKE 1 CAPSULE BY MOUTH TWICE DAILY 180 capsule 1  . ECHINACEA EXTRACT PO Take by mouth.    . ECHINACEA PO Take 1 tablet by mouth daily.    . ferrous sulfate 325 (65 FE) MG tablet Take 325 mg by mouth every evening.     . fluticasone (FLONASE) 50 MCG/ACT nasal spray USE 2 SPRAY(S) IN EACH NOSTRIL ONCE DAILY 16 g 1  . Ginkgo Biloba (GNP GINGKO BILOBA EXTRACT PO) Take 1 tablet by mouth daily.    [name] levothyroxine (SYNTHROID, LEVOTHROID) 100 MCG tablet Take 1 tablet (100 mcg total) by mouth daily. 45 tablet 5  . Misc Natural Products (GLUCOSAMINE CHOND COMPLEX/MSM) TABS Take 1 tablet by mouth 2 (two) times daily.    . Multiple Vitamins-Minerals (MULTIVITAMIN WITH MINERALS) tablet Take 1 tablet by mouth daily.    . Nutritional Supplements (DHEA PO) Take 1 tablet by mouth daily.    . ranitidine (ZANTAC) 300 MG tablet TAKE 1 TABLET BY MOUTH AT BEDTIME 90 tablet 3   No current facility-administered medications on file prior to visit.     Past Medical History:  Diagnosis Date  . Allergy   . Anxiety   . Cancer ([place])    Thyroid-Papillary  . Complication of anesthesia   . Cyst of breast, right, diffuse fibrocystic   .  Hypertension   . Hypothyroidism   . PONV (postoperative nausea and vomiting)     Past Surgical History:  Procedure Laterality Date  . ANTERIOR AND POSTERIOR REPAIR N/A 03/31/2012   Procedure: ANTERIOR (CYSTOCELE) AND POSTERIOR REPAIR (RECTOCELE);  Surgeon: Gus Height, MD;  Location: [facility];  Service: Gynecology;  Laterality: N/A;  . aspiration cyst right breast    . THYROIDECTOMY  1997  . TONSILLECTOMY  1969  . uterine ablation    . VAGINAL HYSTERECTOMY N/A 03/31/2012   Procedure: HYSTERECTOMY VAGINAL;  Surgeon: Gus Height, MD;  Location: [facility];  Service: Gynecology;  Laterality: N/A;    Social History    Socioeconomic History  . Marital status: Married    Spouse name: Not on file  . Number of children: Not on file  . Years of education: Not on file  . Highest education level: Not on file  Occupational History  . Not on file  Social Needs  . Financial resource strain: Not on file  . Food insecurity:    Worry: Not on file    Inability: Not on file  . Transportation needs:    Medical: Not on file    Non-medical: Not on file  Tobacco Use  . Smoking status: Current Every Day Smoker    Packs/day: 0.30    Years: 15.00    Pack years: 4.50    Types: Cigarettes  . Smokeless tobacco: Never Used  . Tobacco comment: Occassional smoker  Substance and Sexual Activity  . Alcohol use: Yes    Comment: Rarely  . Drug use: No  . Sexual activity: Yes    Birth control/protection: Post-menopausal  Lifestyle  . Physical activity:    Days per week: Not on file    Minutes per session: Not on file  . Stress: Not on file  Relationships  . Social connections:    Talks on phone: Not on file    Gets together: Not on file    Attends religious service: Not on file    Active member of club or organization: Not on file    Attends meetings of clubs or organizations: Not on file    Relationship status: Not on file  Other Topics Concern  . Not on file  Social History Narrative  . Not on file    Family History  Problem Relation Age of Onset  . Diabetes Mother   . Deep vein thrombosis Mother        Varicose Veins  . Cancer Father        Father died from brain tumor.  [name] Heart disease Father   . Colon cancer Neg Hx   . Stomach cancer Neg Hx     Review of Systems  Constitutional: Positive for chills. Negative for fever.  HENT: Positive for congestion, ear pain, rhinorrhea, sinus pressure (mild), sneezing and sore throat.   Respiratory: Positive for cough (dry). Negative for shortness of breath and wheezing.   Gastrointestinal: Negative for diarrhea and nausea.  Musculoskeletal: Positive  for myalgias.  Neurological: Negative for dizziness, light-headedness and headaches.       Objective:   Vitals:   12/11/17 1117  BP: 134/84  Pulse: 81  Resp: 16  Temp: 99.3 F (37.4 C)  SpO2: 98%   Filed Weights   12/11/17 1117  Weight: 190 lb (86.2 kg)   Body mass index is 26.88 kg/m.  Wt Readings from Last 3 Encounters:  12/11/17 190 lb (86.2 kg)  11/07/17 189 lb 9.6 oz (  86 kg)  10/29/17 190 lb (86.2 kg)     Physical Exam GENERAL APPEARANCE: Appears stated age, well appearing, NAD EYES: conjunctiva clear, no icterus HEENT: bilateral tympanic membranes and ear canals normal, oropharynx with no erythema, no thyromegaly, trachea midline, no cervical or supraclavicular lymphadenopathy LUNGS: Clear to auscultation without wheeze or crackles, unlabored breathing, good air entry bilaterally CARDIOVASCULAR: Normal S1,S2 without murmurs, no edema SKIN: warm, dry        Assessment & Plan:   See Problem List for Assessment and Plan of chronic medical problems.

## 2017-12-11 NOTE — Assessment & Plan Note (Signed)
Symptoms consistent with a viral infection She did want to be tested for the flu, which should come back negative Discussed supportive treatment with over-the-counter cold medications Increase rest and fluids Call if no improvement or if symptoms change

## 2017-12-11 NOTE — Patient Instructions (Addendum)
Your flu test today is negative.   Your symptoms are consistent with a viral illness.   You are contagious.    Treat your symptoms with over the counter cold medications.  Increase your rest and fluids.   Call if no improvement       Viral Illness, Adult Viruses are tiny germs that can get into a person's body and cause illness. There are many different types of viruses, and they cause many types of illness. Viral illnesses can range from mild to severe. They can affect various parts of the body. Common illnesses that are caused by a virus include colds and the flu. Viral illnesses also include serious conditions such as HIV/AIDS (human immunodeficiency virus/acquired immunodeficiency syndrome). A few viruses have been linked to certain cancers. What are the causes? Many types of viruses can cause illness. Viruses invade cells in your body, multiply, and cause the infected cells to malfunction or die. When the cell dies, it releases more of the virus. When this happens, you develop symptoms of the illness, and the virus continues to spread to other cells. If the virus takes over the function of the cell, it can cause the cell to divide and grow out of control, as is the case when a virus causes cancer. Different viruses get into the body in different ways. You can get a virus by:  Swallowing food or water that is contaminated with the virus.  Breathing in droplets that have been coughed or sneezed into the air by an infected person.  Touching a surface that has been contaminated with the virus and then touching your eyes, nose, or mouth.  Being bitten by an insect or animal that carries the virus.  Having sexual contact with a person who is infected with the virus.  Being exposed to blood or fluids that contain the virus, either through an open cut or during a transfusion.  If a virus enters your body, your body's defense system (immune system) will try to fight the virus. You may be at  higher risk for a viral illness if your immune system is weak. What are the signs or symptoms? Symptoms vary depending on the type of virus and the location of the cells that it invades. Common symptoms of the main types of viral illnesses include: Cold and flu viruses  Fever.  Headache.  Sore throat.  Muscle aches.  Nasal congestion.  Cough. Digestive system (gastrointestinal) viruses  Fever.  Abdominal pain.  Nausea.  Diarrhea. Liver viruses (hepatitis)  Loss of appetite.  Tiredness.  Yellowing of the skin (jaundice). Brain and spinal cord viruses  Fever.  Headache.  Stiff neck.  Nausea and vomiting.  Confusion or sleepiness. Skin viruses  Warts.  Itching.  Rash. Sexually transmitted viruses  Discharge.  Swelling.  Redness.  Rash. How is this treated? Viruses can be difficult to treat because they live within cells. Antibiotic medicines do not treat viruses because these drugs do not get inside cells. Treatment for a viral illness may include:  Resting and drinking plenty of fluids.  Medicines to relieve symptoms. These can include over-the-counter medicine for pain and fever, medicines for cough or congestion, and medicines to relieve diarrhea.  Antiviral medicines. These drugs are available only for certain types of viruses. They may help reduce flu symptoms if taken early. There are also many antiviral medicines for hepatitis and HIV/AIDS.  Some viral illnesses can be prevented with vaccinations. A common example is the flu shot. Follow these instructions  at home: Medicines   Take over-the-counter and prescription medicines only as told by your health care provider.  If you were prescribed an antiviral medicine, take it as told by your health care provider. Do not stop taking the medicine even if you start to feel better.  Be aware of when antibiotics are needed and when they are not needed. Antibiotics do not treat viruses. If your  health care provider thinks that you may have a bacterial infection as well as a viral infection, you may get an antibiotic. ? Do not ask for an antibiotic prescription if you have been diagnosed with a viral illness. That will not make your illness go away faster. ? Frequently taking antibiotics when they are not needed can lead to antibiotic resistance. When this develops, the medicine no longer works against the bacteria that it normally fights. General instructions  Drink enough fluids to keep your urine clear or pale yellow.  Rest as much as possible.  Return to your normal activities as told by your health care provider. Ask your health care provider what activities are safe for you.  Keep all follow-up visits as told by your health care provider. This is important. How is this prevented? Take these actions to reduce your risk of viral infection:  Eat a healthy diet and get enough rest.  Wash your hands often with soap and water. This is especially important when you are in public places. If soap and water are not available, use hand sanitizer.  Avoid close contact with friends and family who have a viral illness.  If you travel to areas where viral gastrointestinal infection is common, avoid drinking water or eating raw food.  Keep your immunizations up to date. Get a flu shot every year as told by your health care provider.  Do not share toothbrushes, nail clippers, razors, or needles with other people.  Always practice safe sex.  Contact a health care provider if:  You have symptoms of a viral illness that do not go away.  Your symptoms come back after going away.  Your symptoms get worse. Get help right away if:  You have trouble breathing.  You have a severe headache or a stiff neck.  You have severe vomiting or abdominal pain. This information is not intended to replace advice given to you by your health care provider. Make sure you discuss any questions you  have with your health care provider. Document Released: 06/16/2015 Document Revised: 07/19/2015 Document Reviewed: 06/16/2015 Elsevier Interactive Patient Education  [employer].

## 2017-12-19 ENCOUNTER — Ambulatory Visit
Admission: RE | Admit: 2017-12-19 | Discharge: 2017-12-19 | Disposition: A | Discharge status: Home / Self Care | Payer: Medicare Other | Source: Ambulatory Visit | Attending: Internal Medicine | Admitting: Internal Medicine

## 2017-12-23 ENCOUNTER — Encounter

## 2017-12-23 ENCOUNTER — Other Ambulatory Visit: Payer: Self-pay | Admitting: Internal Medicine

## 2017-12-23 ENCOUNTER — Ambulatory Visit: Payer: Medicare Other | Admitting: Family

## 2017-12-23 ENCOUNTER — Encounter: Payer: Self-pay | Admitting: Family

## 2017-12-23 VITALS — BP 132/76 | HR 112 | Temp 98.1°F | Ht 70.5 in | Wt 188.1 lb

## 2017-12-23 DIAGNOSIS — I10 Essential (primary) hypertension: Secondary | ICD-10-CM

## 2017-12-23 DIAGNOSIS — J209 Acute bronchitis, unspecified: Secondary | ICD-10-CM | POA: Diagnosis not present

## 2017-12-23 MED ORDER — ATENOLOL-CHLORTHALIDONE 50-25 MG PO TABS: ORAL_TABLET | ORAL | 1 refills | Status: DC

## 2017-12-23 MED ORDER — HYDROCOD POLST-CPM POLST ER 10-8 MG/5ML PO SUER: 5.0000 mL | Freq: Every evening | ORAL | 0 refills | Status: DC | PRN

## 2017-12-23 MED ORDER — AMOXICILLIN-POT CLAVULANATE 875-125 MG PO TABS: 1.0000 | ORAL_TABLET | Freq: Two times a day (BID) | ORAL | 0 refills | Status: DC

## 2017-12-23 NOTE — Progress Notes (Signed)
[name] is a 67 y.o. female with the following history as recorded in EpicCare:  Patient Active Problem List   Diagnosis Date Noted  . Viral illness 12/11/2017  . Stye external 10/29/2017  . Acute pain of right knee 10/24/2017  . De Quervain's disease (radial styloid tenosynovitis) 04/01/2017  . Dysphagia 12/06/2016  . Headache 08/14/2016  . Arthritis of left knee 02/22/2015  . Routine general medical examination at a health care facility 12/16/2014  . Constipation 07/13/2014  . Papillary thyroid carcinoma ([place]) 03/23/2014  . Pure hypercholesterolemia 12/08/2013  . Varicose veins of lower extremities with other complications 62/13/0865  . ADJUSTMENT DISORDER WITH ANXIETY 03/03/2009  . Hypothyroidism 05/19/2007  . Essential hypertension 05/19/2007    Current Outpatient Medications  Medication Sig Dispense Refill  . Ascorbic Acid (VITAMIN C PO) Take 1 tablet by mouth daily.    [name] atenolol-chlorthalidone (TENORETIC) 50-25 MG tablet 1/2 tablet by mouth once daily 45 tablet 1  . Black Cohosh 540 MG CAPS Take 540 mg by mouth 2 (two) times daily.     . butalbital-acetaminophen-caffeine (ESGIC) 50-325-40 MG tablet Take 1 tablet by mouth 2 (two) times daily as needed for headache. 30 tablet 1  . CALCIUM PO Take 1 tablet by mouth daily.    [name] COENZYME Q-10 PO Take 1 tablet by mouth daily.    . Cyanocobalamin (VITAMIN B-12 PO) Take 1 tablet by mouth daily.    . diclofenac (VOLTAREN) 75 MG EC tablet Take 1 tablet (75 mg total) by mouth 2 (two) times daily as needed. 30 tablet 1  . DULoxetine (CYMBALTA) 60 MG capsule TAKE 1 CAPSULE BY MOUTH TWICE DAILY 180 capsule 1  . ECHINACEA EXTRACT PO Take by mouth.    . ECHINACEA PO Take 1 tablet by mouth daily.    . ferrous sulfate 325 (65 FE) MG tablet Take 325 mg by mouth every evening.     . fluticasone (FLONASE) 50 MCG/ACT nasal spray USE 2 SPRAY(S) IN EACH NOSTRIL ONCE DAILY 16 g 1  . Ginkgo Biloba (GNP GINGKO BILOBA EXTRACT PO) Take 1  tablet by mouth daily.    [name] levothyroxine (SYNTHROID, LEVOTHROID) 100 MCG tablet Take 1 tablet (100 mcg total) by mouth daily. 45 tablet 5  . Misc Natural Products (GLUCOSAMINE CHOND COMPLEX/MSM) TABS Take 1 tablet by mouth 2 (two) times daily.    . Multiple Vitamins-Minerals (MULTIVITAMIN WITH MINERALS) tablet Take 1 tablet by mouth daily.    . Nutritional Supplements (DHEA PO) Take 1 tablet by mouth daily.    . ranitidine (ZANTAC) 300 MG tablet TAKE 1 TABLET BY MOUTH AT BEDTIME 90 tablet 3  . amoxicillin-clavulanate (AUGMENTIN) 875-125 MG tablet Take 1 tablet by mouth 2 (two) times daily. 20 tablet 0  . chlorpheniramine-HYDROcodone (TUSSIONEX PENNKINETIC ER) 10-8 MG/5ML SUER Take 5 mLs by mouth at bedtime as needed for cough. 75 mL 0   No current facility-administered medications for this visit.     Allergies: Permethrin  Past Medical History:  Diagnosis Date  . Allergy   . Anxiety   . Cancer ([place])    Thyroid-Papillary  . Complication of anesthesia   . Cyst of breast, right, diffuse fibrocystic   . Hypertension   . Hypothyroidism   . PONV (postoperative nausea and vomiting)     Past Surgical History:  Procedure Laterality Date  . ANTERIOR AND POSTERIOR REPAIR N/A 03/31/2012   Procedure: ANTERIOR (CYSTOCELE) AND POSTERIOR REPAIR (RECTOCELE);  Surgeon: Gus Height, MD;  Location: [facility];  Service: Gynecology;  Laterality: N/A;  . aspiration cyst right breast    . THYROIDECTOMY  1997  . TONSILLECTOMY  1969  . uterine ablation    . VAGINAL HYSTERECTOMY N/A 03/31/2012   Procedure: HYSTERECTOMY VAGINAL;  Surgeon: Gus Height, MD;  Location: [facility];  Service: Gynecology;  Laterality: N/A;    Family History  Problem Relation Age of Onset  . Diabetes Mother   . Deep vein thrombosis Mother        Varicose Veins  . Cancer Father        Father died from brain tumor.  [name] Heart disease Father   . Colon cancer Neg Hx   . Stomach cancer Neg Hx     Social History   Tobacco Use  . Smoking  status: Current Every Day Smoker    Packs/day: 0.30    Years: 15.00    Pack years: 4.50    Types: Cigarettes  . Smokeless tobacco: Never Used  . Tobacco comment: Occassional smoker  Substance Use Topics  . Alcohol use: Yes    Comment: Rarely    Subjective:  Patient was seen on 10/24 with suspected viral illness; told to call back if symptoms progressed; now in the office almost 2 weeks later with worsening symptoms; + chest congestion, fatigue; history of pneumonia; + occasional wheezing; increased coughing at night- has to sleep propped up on pillows.  Objective:  Vitals:   12/23/17 1115  BP: 132/76  Pulse: (!) 112  Temp: 98.1 F (36.7 C)  TempSrc: Oral  SpO2: 92%  Weight: 188 lb 1.3 oz (85.3 kg)  Height: 5' 10.5" (1.791 m)    General: Well developed, well nourished, in no acute distress  Skin : Warm and dry.  Head: Normocephalic and atraumatic  Eyes: Sclera and conjunctiva clear; pupils round and reactive to light; extraocular movements intact  Ears: External normal; canals clear; tympanic membranes normal  Oropharynx: Pink, supple. No suspicious lesions  Neck: Supple without thyromegaly, adenopathy  Lungs: Respirations unlabored; wheezing noted in upper lobes CVS exam: normal rate and regular rhythm.  Neurologic: Alert and oriented; speech intact; face symmetrical; moves all extremities well; CNII-XII intact without focal deficit   Assessment:  1. Acute bronchitis, unspecified organism   2. Essential hypertension     Plan:  1. Rx for Augmentin 875 mg bid x 10 days, Symbicort 160/4.5 2 puffs bid, Tussionex 1 tsp po qhs; increase fluids, rest and follow-up worse, no better. 2. Stable; refill updated; keep planned follow-up with her PCP.   No follow-ups on file.  No orders of the defined types were placed in this encounter.   Requested Prescriptions   Signed Prescriptions Disp Refills  . atenolol-chlorthalidone (TENORETIC) 50-25 MG tablet 45 tablet 1    Sig: 1/2  tablet by mouth once daily  . amoxicillin-clavulanate (AUGMENTIN) 875-125 MG tablet 20 tablet 0    Sig: Take 1 tablet by mouth 2 (two) times daily.  . chlorpheniramine-HYDROcodone (TUSSIONEX PENNKINETIC ER) 10-8 MG/5ML SUER 75 mL 0    Sig: Take 5 mLs by mouth at bedtime as needed for cough.

## 2018-01-09 ENCOUNTER — Ambulatory Visit: Payer: Medicare Other | Admitting: Internal Medicine

## 2018-01-09 ENCOUNTER — Encounter: Payer: Self-pay | Admitting: Internal Medicine

## 2018-01-09 DIAGNOSIS — J029 Acute pharyngitis, unspecified: Secondary | ICD-10-CM

## 2018-01-09 LAB — POCT RAPID STREP A (OFFICE): RAPID STREP A SCREEN: NEGATIVE

## 2018-01-09 NOTE — Patient Instructions (Signed)
Your strep test is negative and we will have you keep using cough drops and warm liquids to help.

## 2018-01-09 NOTE — Addendum Note (Signed)
Addended by: Raford Pitcher R on: 01/09/2018 08:37 AM   Modules accepted: Orders

## 2018-01-09 NOTE — Assessment & Plan Note (Signed)
Strep test done as exposure recently which is negative in the office. Reassurance given and advised to use otc remedies.

## 2018-01-09 NOTE — Progress Notes (Signed)
   Subjective:    Patient ID: Theresa Perez, female    DOB: 01-Jan-1951, 67 y.o.   MRN: [id number]  HPI The patient is a 67 YO female coming in for possible strep throat. Her grandchildren have it and she has been exposed to them. Her throat is just itchy but not sore. She denies cough or SOB. She does have some nasal drainage. Started 2 days ago. Overall it is improving. Has tried some cough drops.Devnies cough or SOB. Denies fevers or chills. Denies ear pain or pressure.   Review of Systems  Constitutional: Negative for activity change, appetite change, chills, fatigue, fever and unexpected weight change.  HENT: Positive for congestion and sore throat. Negative for ear discharge, ear pain, postnasal drip, rhinorrhea, sinus pressure, sinus pain, sneezing, tinnitus, trouble swallowing and voice change.   Eyes: Negative.   Respiratory: Negative for cough, chest tightness, shortness of breath and wheezing.   Cardiovascular: Negative.   Gastrointestinal: Negative.   Neurological: Negative.       Objective:   Physical Exam  Constitutional: She is oriented to person, place, and time. She appears well-developed and well-nourished.  HENT:  Head: Normocephalic and atraumatic.  Oropharynx slight redness, no drainage or purulent discharge  Eyes: EOM are normal.  Neck: Normal range of motion.  Cardiovascular: Normal rate and regular rhythm.  Pulmonary/Chest: Effort normal and breath sounds normal. No respiratory distress. She has no wheezes. She has no rales.  Musculoskeletal: She exhibits no edema.  Lymphadenopathy:    She has no cervical adenopathy.  Neurological: She is alert and oriented to person, place, and time. Coordination normal.  Skin: Skin is warm and dry.   Vitals:   01/09/18 0758  BP: 130/88  Pulse: 77  Temp: 98.2 F (36.8 C)  TempSrc: Oral  SpO2: 98%  Weight: 188 lb (85.3 kg)  Height: 5' 10.5" (1.791 m)      Assessment & Plan:

## 2018-01-14 ENCOUNTER — Ambulatory Visit: Payer: Self-pay | Admitting: *Deleted

## 2018-01-14 ENCOUNTER — Encounter: Payer: Self-pay | Admitting: Internal Medicine

## 2018-01-14 ENCOUNTER — Ambulatory Visit: Payer: Medicare Other | Admitting: Internal Medicine

## 2018-01-14 VITALS — BP 118/78 | HR 70 | Temp 98.4°F | Ht 70.5 in | Wt 185.0 lb

## 2018-01-14 DIAGNOSIS — R059 Cough, unspecified: Secondary | ICD-10-CM

## 2018-01-14 DIAGNOSIS — R05 Cough: Secondary | ICD-10-CM

## 2018-01-14 MED ORDER — DOXYCYCLINE HYCLATE 100 MG PO TABS: 100.0000 mg | ORAL_TABLET | Freq: Two times a day (BID) | ORAL | 0 refills | Status: DC

## 2018-01-14 NOTE — Patient Instructions (Signed)
We have sent in doxycycline to start taking today 1 pill twice a day for 1 week.

## 2018-01-14 NOTE — Telephone Encounter (Signed)
Pt called with cough and wheezing. She stated the productive cough started about 2 and half days ago. she had a dry cough before this. She has started to wheeze now. She had been treated for bronchitis. She has a runny nose and is coughing up greenish yellow sputum. No fever or dizziness. She is drinking lots of fluids.  She is requesting an appointment for today. Appointment scheduled for this morning. Routing to flow at [facility].  Reason for Disposition . Wheezing is present  Answer Assessment - Initial Assessment Questions 1. ONSET: "When did the cough begin?"      About 2 and half days now 2. SEVERITY: "How bad is the cough today?"      Not too bad 3. RESPIRATORY DISTRESS: "Describe your breathing."      Can hear wheezing 4. FEVER: "Do you have a fever?" If so, ask: "What is your temperature, how was it measured, and when did it start?"     no 5. SPUTUM: "Describe the color of your sputum" (clear, white, yellow, green)     Greenish yellow 6. HEMOPTYSIS: "Are you coughing up any blood?" If so ask: "How much?" (flecks, streaks, tablespoons, etc.)     no 7. CARDIAC HISTORY: "Do you have any history of heart disease?" (e.g., heart attack, congestive heart failure)      no 8. LUNG HISTORY: "Do you have any history of lung disease?"  (e.g., pulmonary embolus, asthma, emphysema)     no 9. PE RISK FACTORS: "Do you have a history of blood clots?" (or: recent major surgery, recent prolonged travel, bedridden)     no 10. OTHER SYMPTOMS: "Do you have any other symptoms?" (e.g., runny nose, wheezing, chest pain)       Wheezing, runny nose 12. TRAVEL: "Have you traveled out of the country in the last month?" (e.g., travel history, exposures)       no  Protocols used: [place]

## 2018-01-14 NOTE — Assessment & Plan Note (Addendum)
Treat with doxycyline due to history and severity. Continue flonase and allergy medication.

## 2018-01-14 NOTE — Telephone Encounter (Signed)
This patient has an appointment at [facility] not our office.

## 2018-01-14 NOTE — Progress Notes (Signed)
   Subjective:    Patient ID: Theresa Perez, female    DOB: [birth year], 67 y.o.   MRN: [id number]  HPI The patient is a 67 YO female coming in for cough. Was recently seen about 5 days ago for 1 day of sore throat. Was not having these symptoms then. Taking otc medications like theraful and dayquil/nyquil. She is having cough with green sputum currently. Started about 3-4 days ago. Is having some wheezing but no SOB. Does have nose drainage. She is having ear pressure, sinus congestion. Denies headaches. Denies fevers or chills. She is exposed to several sick grandchildren. Overall worsening.  Review of Systems  Constitutional: Positive for activity change and appetite change. Negative for chills, fatigue, fever and unexpected weight change.  HENT: Positive for congestion, ear pain, postnasal drip, rhinorrhea, sinus pressure and sneezing. Negative for ear discharge, sinus pain, sore throat, tinnitus, trouble swallowing and voice change.   Eyes: Negative.   Respiratory: Positive for cough and wheezing. Negative for chest tightness and shortness of breath.   Cardiovascular: Negative.   Gastrointestinal: Negative.   Musculoskeletal: Positive for myalgias.  Neurological: Negative.       Objective:   Physical Exam  Constitutional: She is oriented to person, place, and time. She appears well-developed and well-nourished.  HENT:  Head: Normocephalic and atraumatic.  Oropharynx with redness and clear drainage, nose with swollen turbinates, TMs normal bilaterally  Eyes: EOM are normal.  Neck: Normal range of motion. No thyromegaly present.  Cardiovascular: Normal rate and regular rhythm.  Pulmonary/Chest: Effort normal. No respiratory distress. She has no wheezes. She has no rales.  Rhonchi at bases which partially clears with coughing  Abdominal: Soft.  Lymphadenopathy:    She has no cervical adenopathy.  Neurological: She is alert and oriented to person, place, and time.  Skin: Skin is  warm and dry.   Vitals:   01/14/18 0914  BP: 118/78  Pulse: 70  Temp: 98.4 F (36.9 C)  TempSrc: Oral  SpO2: 96%  Weight: 185 lb (83.9 kg)  Height: 5' 10.5" (1.791 m)      Assessment & Plan:

## 2018-02-05 ENCOUNTER — Other Ambulatory Visit: Payer: Self-pay | Admitting: Internal Medicine

## 2018-02-05 DIAGNOSIS — J069 Acute upper respiratory infection, unspecified: Secondary | ICD-10-CM

## 2018-02-27 ENCOUNTER — Encounter: Payer: Self-pay | Admitting: Internal Medicine

## 2018-02-27 MED ORDER — FAMOTIDINE 40 MG PO TABS: 40.0000 mg | ORAL_TABLET | Freq: Every day | ORAL | 3 refills | Status: DC

## 2018-05-09 ENCOUNTER — Other Ambulatory Visit: Payer: Self-pay | Admitting: Internal Medicine

## 2018-05-09 DIAGNOSIS — J069 Acute upper respiratory infection, unspecified: Secondary | ICD-10-CM

## 2018-05-20 ENCOUNTER — Other Ambulatory Visit: Payer: Self-pay | Admitting: Internal Medicine

## 2018-05-20 DIAGNOSIS — F4322 Adjustment disorder with anxiety: Secondary | ICD-10-CM

## 2018-05-25 ENCOUNTER — Telehealth: Payer: Self-pay

## 2018-05-25 NOTE — Telephone Encounter (Signed)
Called patient and pushed CPE back a couple months

## 2018-05-25 NOTE — Telephone Encounter (Signed)
Copied from [place] [id number]. Topic: Appointment Scheduling - Scheduling Inquiry for Clinic >> May 25, 2018 12:34 PM Margot Ables wrote: Reason for CRM: pt called to confirm appt 05/28/2018 - she can do virtual on smartphone [phone] & correct email - she asked if she should come in for yearly labs and thyroid check prior to appt. Please call to advise. >> May 25, 2018 12:38 PM Peace, Tammy L wrote: Please advise - CPE

## 2018-05-28 ENCOUNTER — Encounter: Payer: Medicare Other | Admitting: Internal Medicine

## 2018-06-04 ENCOUNTER — Other Ambulatory Visit: Payer: Self-pay | Admitting: Internal Medicine

## 2018-06-04 DIAGNOSIS — Z1231 Encounter for screening mammogram for malignant neoplasm of breast: Secondary | ICD-10-CM

## 2018-06-20 ENCOUNTER — Other Ambulatory Visit: Payer: Self-pay | Admitting: Internal Medicine

## 2018-06-20 DIAGNOSIS — J069 Acute upper respiratory infection, unspecified: Secondary | ICD-10-CM

## 2018-06-24 ENCOUNTER — Other Ambulatory Visit: Payer: Self-pay | Admitting: Family

## 2018-06-24 DIAGNOSIS — I10 Essential (primary) hypertension: Secondary | ICD-10-CM

## 2018-07-03 ENCOUNTER — Other Ambulatory Visit: Payer: Self-pay | Admitting: Internal Medicine

## 2018-07-03 DIAGNOSIS — E890 Postprocedural hypothyroidism: Secondary | ICD-10-CM

## 2018-07-29 ENCOUNTER — Ambulatory Visit
Admission: RE | Admit: 2018-07-29 | Discharge: 2018-07-29 | Disposition: A | Discharge status: Home / Self Care | Payer: Medicare Other | Source: Ambulatory Visit | Attending: Internal Medicine | Admitting: Internal Medicine

## 2018-07-29 ENCOUNTER — Other Ambulatory Visit: Payer: Self-pay

## 2018-07-29 DIAGNOSIS — Z1231 Encounter for screening mammogram for malignant neoplasm of breast: Secondary | ICD-10-CM

## 2018-08-04 ENCOUNTER — Ambulatory Visit (INDEPENDENT_AMBULATORY_CARE_PROVIDER_SITE_OTHER): Payer: Medicare Other | Admitting: Internal Medicine

## 2018-08-04 ENCOUNTER — Encounter: Payer: Self-pay | Admitting: Internal Medicine

## 2018-08-04 DIAGNOSIS — J069 Acute upper respiratory infection, unspecified: Secondary | ICD-10-CM

## 2018-08-04 DIAGNOSIS — F4322 Adjustment disorder with anxiety: Secondary | ICD-10-CM

## 2018-08-04 MED ORDER — FLUTICASONE PROPIONATE 50 MCG/ACT NA SUSP: NASAL | 2 refills | Status: DC

## 2018-08-04 MED ORDER — LORAZEPAM 0.5 MG PO TABS: 0.2500 mg | ORAL_TABLET | Freq: Every day | ORAL | 1 refills | Status: DC | PRN

## 2018-08-04 NOTE — Assessment & Plan Note (Signed)
Rx for small supply of lorazepam to be used sparingly. Continue cymbalta 120 mg daily. Adjust as needed next week at visit.

## 2018-08-04 NOTE — Progress Notes (Signed)
Virtual Visit via Audio Note  I connected with Genoveva Ill on 02/33/43 at 56:86 AM EDT by an audio-only enabled telemedicine application and verified that I am speaking with the correct person using two identifiers.  The patient and the provider were at separate locations throughout the entire encounter.   I discussed the limitations of evaluation and management by telemedicine and the availability of in person appointments. The patient expressed understanding and agreed to proceed.  History of Present Illness: The patient is a 68 y.o. female with visit for new anxiety. A lot of family stressors going on now as well as pandemic stressors. Likes playing tennis and only recently got to do this again. Started in the last several months. Has tried gardening which helps some. Is worrying a lot. Has tried 1/2 lorazepam which she had at home rarely which did help. Denies SI/HI. Denies chronic depression symptoms. Still taking cymbalta and feels that this is helping well but she just has too many stressors at this time. Denies sleeping problems much. Overall it is worsening.   Observations/Objective: voice strong, no dyspnea, A and O times 3  Assessment and Plan: See problem oriented charting  Follow Up Instructions: rx lorazepam small supply and see in 1-2 weeks for physical with labs  Visit time 13 minutes: that time was spent in face to face counseling and coordination of care with the patient: counseled about as above  I discussed the assessment and treatment plan with the patient. The patient was provided an opportunity to ask questions and all were answered. The patient agreed with the plan and demonstrated an understanding of the instructions.   The patient was advised to call back or seek an in-person evaluation if the symptoms worsen or if the condition fails to improve as anticipated.  Hoyt Koch, MD

## 2018-08-05 ENCOUNTER — Other Ambulatory Visit: Payer: Self-pay | Admitting: Internal Medicine

## 2018-08-05 DIAGNOSIS — F4322 Adjustment disorder with anxiety: Secondary | ICD-10-CM

## 2018-08-13 ENCOUNTER — Ambulatory Visit (INDEPENDENT_AMBULATORY_CARE_PROVIDER_SITE_OTHER): Payer: Medicare Other | Admitting: Internal Medicine

## 2018-08-13 ENCOUNTER — Encounter: Payer: Self-pay | Admitting: Internal Medicine

## 2018-08-13 ENCOUNTER — Other Ambulatory Visit: Payer: Self-pay

## 2018-08-13 ENCOUNTER — Other Ambulatory Visit (INDEPENDENT_AMBULATORY_CARE_PROVIDER_SITE_OTHER): Payer: Medicare Other

## 2018-08-13 ENCOUNTER — Encounter: Payer: Medicare Other | Admitting: Internal Medicine

## 2018-08-13 VITALS — BP 118/82 | HR 72 | Temp 98.2°F | Ht 70.5 in | Wt 178.1 lb

## 2018-08-13 DIAGNOSIS — Z0000 Encounter for general adult medical examination without abnormal findings: Secondary | ICD-10-CM

## 2018-08-13 DIAGNOSIS — J410 Simple chronic bronchitis: Secondary | ICD-10-CM | POA: Diagnosis not present

## 2018-08-13 DIAGNOSIS — E7800 Pure hypercholesterolemia, unspecified: Secondary | ICD-10-CM

## 2018-08-13 DIAGNOSIS — I10 Essential (primary) hypertension: Secondary | ICD-10-CM | POA: Diagnosis not present

## 2018-08-13 DIAGNOSIS — R1310 Dysphagia, unspecified: Secondary | ICD-10-CM

## 2018-08-13 DIAGNOSIS — C73 Malignant neoplasm of thyroid gland: Secondary | ICD-10-CM

## 2018-08-13 DIAGNOSIS — F4322 Adjustment disorder with anxiety: Secondary | ICD-10-CM

## 2018-08-13 LAB — COMPREHENSIVE METABOLIC PANEL
ALT: 27 U/L (ref 0–35)
AST: 38 U/L — ABNORMAL HIGH (ref 0–37)
Albumin: 4.5 g/dL (ref 3.5–5.2)
Alkaline Phosphatase: 41 U/L (ref 39–117)
BUN: 12 mg/dL (ref 6–23)
CO2: 30 mEq/L (ref 19–32)
Calcium: 9.2 mg/dL (ref 8.4–10.5)
Chloride: 99 mEq/L (ref 96–112)
Creatinine, Ser: 0.65 mg/dL (ref 0.40–1.20)
GFR: 90.96 mL/min (ref >60.00)
Glucose, Bld: 116 mg/dL — ABNORMAL HIGH (ref 70–99)
Potassium: 3.3 mEq/L — ABNORMAL LOW (ref 3.5–5.1)
Sodium: 136 mEq/L (ref 135–145)
Total Bilirubin: 0.6 mg/dL (ref 0.2–1.2)
Total Protein: 7.2 g/dL (ref 6.0–8.3)

## 2018-08-13 LAB — CBC
HCT: 43.3 % (ref 36.0–46.0)
Hemoglobin: 14.6 g/dL (ref 12.0–15.0)
MCHC: 33.8 g/dL (ref 30.0–36.0)
MCV: 89.4 fl (ref 78.0–100.0)
Platelets: 320 10*3/uL (ref 150.0–400.0)
RBC: 4.84 Mil/uL (ref 3.87–5.11)
RDW: 13 % (ref 11.5–15.5)
WBC: 7.4 10*3/uL (ref 4.0–10.5)

## 2018-08-13 LAB — T4, FREE: Free T4: 0.74 ng/dL (ref 0.60–1.60)

## 2018-08-13 LAB — TSH: TSH: 4.37 u[IU]/mL (ref 0.35–4.50)

## 2018-08-13 LAB — LIPID PANEL
Cholesterol: 183 mg/dL (ref 0–200)
HDL: 44.4 mg/dL (ref >39.00)
LDL Cholesterol: 109 mg/dL — ABNORMAL HIGH (ref 0–99)
NonHDL: 138.44
Total CHOL/HDL Ratio: 4
Triglycerides: 148 mg/dL (ref 0.0–149.0)
VLDL: 29.6 mg/dL (ref 0.0–40.0)

## 2018-08-13 NOTE — Patient Instructions (Signed)

## 2018-08-13 NOTE — Progress Notes (Signed)
Subjective:   Patient ID: Theresa Perez, female    DOB: [birth year], 68 y.o.   MRN: [id number]  HPI Here for medicare wellness and physical, no new complaints. Please see A/P for status and treatment of chronic medical problems.   Diet: heart healthy Physical activity: sedentary, walks dog once a day 10 minutes or so Depression/mood screen: negative Hearing: intact to whispered voice, mild loss bilateral without hearing impairment Visual acuity: grossly normal, performs annual eye exam  ADLs: capable Fall risk: none Home safety: good Cognitive evaluation: intact to orientation, naming, recall and repetition EOL planning: adv directives discussed    Office Visit from 08/13/2018 in [place]  PHQ-2 Total Score  0      I have personally reviewed and have noted 1. The patient's medical and social history - reviewed today no changes 2. Their use of alcohol, tobacco or illicit drugs 3. Their current medications and supplements 4. The patient's functional ability including ADL's, fall risks, home safety risks and hearing or visual impairment. 5. Diet and physical activities 6. Evidence for depression or mood disorders 7. Care team reviewed and updated  Patient Care Team: Hoyt Koch, MD as PCP - General (Internal Medicine) Past Medical History:  Diagnosis Date  . Allergy   . Anxiety   . Cancer ([place])    Thyroid-Papillary  . Complication of anesthesia   . Cyst of breast, right, diffuse fibrocystic   . Hypertension   . Hypothyroidism   . PONV (postoperative nausea and vomiting)    Past Surgical History:  Procedure Laterality Date  . ANTERIOR AND POSTERIOR REPAIR N/A 03/31/2012   Procedure: ANTERIOR (CYSTOCELE) AND POSTERIOR REPAIR (RECTOCELE);  Surgeon: Gus Height, MD;  Location: [facility];  Service: Gynecology;  Laterality: N/A;  . aspiration cyst right breast    . THYROIDECTOMY  1997  . TONSILLECTOMY  1969  . uterine ablation    .  VAGINAL HYSTERECTOMY N/A 03/31/2012   Procedure: HYSTERECTOMY VAGINAL;  Surgeon: Gus Height, MD;  Location: [facility];  Service: Gynecology;  Laterality: N/A;   Family History  Problem Relation Age of Onset  . Diabetes Mother   . Deep vein thrombosis Mother        Varicose Veins  . Cancer Father        Father died from brain tumor.  [name] Heart disease Father   . Colon cancer Neg Hx   . Stomach cancer Neg Hx     Review of Systems  Constitutional: Negative.   HENT: Negative.   Eyes: Negative.   Respiratory: Negative for cough, chest tightness and shortness of breath.   Cardiovascular: Negative for chest pain, palpitations and leg swelling.  Gastrointestinal: Negative for abdominal distention, abdominal pain, constipation, diarrhea, nausea and vomiting.  Musculoskeletal: Negative.   Skin: Negative.   Neurological: Negative.   Psychiatric/Behavioral: Negative.     Objective:  Physical Exam Constitutional:      Appearance: She is well-developed.  HENT:     Head: Normocephalic and atraumatic.  Neck:     Musculoskeletal: Normal range of motion.  Cardiovascular:     Rate and Rhythm: Normal rate and regular rhythm.  Pulmonary:     Effort: Pulmonary effort is normal. No respiratory distress.     Breath sounds: Normal breath sounds. No wheezing or rales.  Abdominal:     General: Bowel sounds are normal. There is no distension.     Palpations: Abdomen is soft.     Tenderness: There  is no abdominal tenderness. There is no rebound.  Skin:    General: Skin is warm and dry.  Neurological:     Mental Status: She is alert and oriented to person, place, and time.     Coordination: Coordination normal.     Vitals:   08/13/18 1353  BP: 118/82  Pulse: 72  Temp: 98.2 F (36.8 C)  TempSrc: Oral  SpO2: 97%  Weight: 178 lb 1.9 oz (80.8 kg)  Height: 5' 10.5" (1.791 m)    Assessment & Plan:

## 2018-08-14 NOTE — Assessment & Plan Note (Signed)
Checking lipid panel and adjust as needed. Not on meds currently.  

## 2018-08-14 NOTE — Assessment & Plan Note (Signed)
Counseled to quit smoking but she is unable to make attempt at this time. She is willing to cut back so advised to do this. Reminded about the risk and harm to her health from continuing to smoke.

## 2018-08-14 NOTE — Assessment & Plan Note (Signed)
Checking TSH and free T4 and adjust as needed synthroid 1000 mcg daily.

## 2018-08-14 NOTE — Assessment & Plan Note (Signed)
This has resolved with time and no longer troubles her.

## 2018-08-14 NOTE — Assessment & Plan Note (Signed)
Flu shot counseled and yearly. Pneumonia complete. Shingrix counseled. Tetanus due 2027. Colonoscopy due now and advised she would like cologuard and still has kit at home will check if expired. Mammogram due 2022, pap smear aged out and dexa due 2022. Counseled about sun safety and mole surveillance. Counseled about the dangers of distracted driving. Given 10 year screening recommendations.

## 2018-08-14 NOTE — Assessment & Plan Note (Signed)
BP at goal on atenolol/chlorthalidone. Checking CMP and adjust as needed. HR fine.

## 2018-08-14 NOTE — Assessment & Plan Note (Signed)
Taking cymbalta and this is still helping well. Coping okay with the pandemic.

## 2018-08-19 ENCOUNTER — Other Ambulatory Visit: Payer: Self-pay | Admitting: Internal Medicine

## 2018-08-19 DIAGNOSIS — E890 Postprocedural hypothyroidism: Secondary | ICD-10-CM

## 2018-09-07 ENCOUNTER — Encounter: Payer: Self-pay | Admitting: Internal Medicine

## 2018-10-01 ENCOUNTER — Other Ambulatory Visit: Payer: Self-pay | Admitting: Internal Medicine

## 2018-10-01 DIAGNOSIS — E890 Postprocedural hypothyroidism: Secondary | ICD-10-CM

## 2018-10-29 ENCOUNTER — Ambulatory Visit: Payer: Medicare Other | Admitting: Internal Medicine

## 2018-11-20 ENCOUNTER — Other Ambulatory Visit: Payer: Self-pay | Admitting: Internal Medicine

## 2018-11-20 DIAGNOSIS — E890 Postprocedural hypothyroidism: Secondary | ICD-10-CM

## 2018-11-20 NOTE — Telephone Encounter (Signed)
Okay to refill for the next 6 months, but I will need to see her within this interval for further refills.

## 2018-11-20 NOTE — Telephone Encounter (Signed)
Done

## 2018-11-20 NOTE — Telephone Encounter (Signed)
Last OV 10/23/17 refill or refuse please advise

## 2018-12-12 ENCOUNTER — Other Ambulatory Visit: Payer: Self-pay | Admitting: Internal Medicine

## 2018-12-12 DIAGNOSIS — I10 Essential (primary) hypertension: Secondary | ICD-10-CM

## 2019-01-07 ENCOUNTER — Other Ambulatory Visit: Payer: Self-pay | Admitting: Internal Medicine

## 2019-01-07 DIAGNOSIS — E890 Postprocedural hypothyroidism: Secondary | ICD-10-CM

## 2019-01-12 ENCOUNTER — Other Ambulatory Visit: Payer: Self-pay | Admitting: Internal Medicine

## 2019-01-12 DIAGNOSIS — E890 Postprocedural hypothyroidism: Secondary | ICD-10-CM

## 2019-01-28 ENCOUNTER — Other Ambulatory Visit: Payer: Self-pay | Admitting: Internal Medicine

## 2019-01-28 NOTE — Telephone Encounter (Signed)
Control database checked last refill: 10/10/2018 20 tabs LOV:08/13/2018 cpe NOV:08/16/2019

## 2019-02-10 ENCOUNTER — Other Ambulatory Visit: Payer: Self-pay | Admitting: Internal Medicine

## 2019-02-10 DIAGNOSIS — F4322 Adjustment disorder with anxiety: Secondary | ICD-10-CM

## 2019-03-10 ENCOUNTER — Telehealth: Payer: Self-pay

## 2019-03-10 DIAGNOSIS — N6310 Unspecified lump in the right breast, unspecified quadrant: Secondary | ICD-10-CM

## 2019-03-10 NOTE — Telephone Encounter (Signed)
Copied from [place] [id number]. Topic: General - Other >> Mar 05, 2019 11:49 AM Leward Quan A wrote: Reason for CRM: Patient called to inform Dr Sharlet Salina that she found a lump in right breast. Per patient breast center need a referral for a diagnostic mammogram. Asking for that to be done asap please. Ph# [phone]

## 2019-03-11 NOTE — Telephone Encounter (Signed)
Left detailed message informing pt of below.  

## 2019-03-11 NOTE — Addendum Note (Signed)
Addended by: Pricilla Holm A on: 03/11/2019 08:55 AM   Modules accepted: Orders

## 2019-03-11 NOTE — Telephone Encounter (Signed)
Order placed

## 2019-03-12 ENCOUNTER — Other Ambulatory Visit: Payer: Self-pay | Admitting: Internal Medicine

## 2019-03-12 DIAGNOSIS — N6310 Unspecified lump in the right breast, unspecified quadrant: Secondary | ICD-10-CM

## 2019-03-13 ENCOUNTER — Other Ambulatory Visit: Payer: Self-pay | Admitting: Internal Medicine

## 2019-03-13 DIAGNOSIS — I10 Essential (primary) hypertension: Secondary | ICD-10-CM

## 2019-03-18 ENCOUNTER — Ambulatory Visit
Admission: RE | Admit: 2019-03-18 | Discharge: 2019-03-18 | Disposition: A | Discharge status: Home / Self Care | Payer: Medicare PPO | Source: Ambulatory Visit | Attending: Internal Medicine | Admitting: Internal Medicine

## 2019-03-18 ENCOUNTER — Ambulatory Visit
Admission: RE | Admit: 2019-03-18 | Discharge: 2019-03-18 | Disposition: A | Discharge status: Home / Self Care | Payer: Medicare Other | Source: Ambulatory Visit | Attending: Internal Medicine | Admitting: Internal Medicine

## 2019-03-18 ENCOUNTER — Other Ambulatory Visit: Payer: Self-pay

## 2019-03-18 DIAGNOSIS — N6310 Unspecified lump in the right breast, unspecified quadrant: Secondary | ICD-10-CM

## 2019-03-18 DIAGNOSIS — R922 Inconclusive mammogram: Secondary | ICD-10-CM | POA: Diagnosis not present

## 2019-03-18 DIAGNOSIS — N6011 Diffuse cystic mastopathy of right breast: Secondary | ICD-10-CM | POA: Diagnosis not present

## 2019-05-17 ENCOUNTER — Other Ambulatory Visit: Payer: Self-pay | Admitting: Internal Medicine

## 2019-05-18 NOTE — Telephone Encounter (Signed)
Done erx 

## 2019-05-18 NOTE — Telephone Encounter (Signed)
Check [facility] registry last filled 01/28/2019. MD is out of the office this week pls advise on refill.Marland KitchenJohny Perez

## 2019-06-18 ENCOUNTER — Telehealth: Payer: Self-pay

## 2019-06-18 NOTE — Telephone Encounter (Signed)
Please advise. Thanks.  

## 2019-06-18 NOTE — Telephone Encounter (Signed)
New message   The patient calling with a couple of questions   Upcoming COVID vaccine appt & Dentist appt on the same day next week.   Has questions regarding the COVID vaccine the patient Grand-daughter advise the patient she has to quartine for two week , I asked the patient was the grand-daughter a Location manager, the patient advise no.   The patient is asking how many days should she quartine after to get the COVID vaccine.

## 2019-06-21 NOTE — Telephone Encounter (Signed)
F/u  The patient calling back regarding previous messages, have not heard anything.    Dental appt on 06/22/19 @ 8am   COVID vaccine appt on  06/22/19 @ 12:20pm

## 2019-06-21 NOTE — Telephone Encounter (Addendum)
Called patient 2x's  lvm for return call to office

## 2019-06-21 NOTE — Telephone Encounter (Signed)
Patient called spoke with her. Informed her that it's okay to go to dentist and have vaccine on same day.   Also advised her to speak with her dentist to have the okay by them also.

## 2019-06-22 ENCOUNTER — Encounter: Payer: Self-pay | Admitting: Internal Medicine

## 2019-06-24 NOTE — Telephone Encounter (Signed)
I do not understand this question. Is she asking if she needs to quarantine after going to the dentist?

## 2019-06-25 ENCOUNTER — Encounter: Payer: Self-pay | Admitting: Internal Medicine

## 2019-06-25 ENCOUNTER — Encounter: Payer: Self-pay | Admitting: Counselor

## 2019-06-25 ENCOUNTER — Other Ambulatory Visit: Payer: Self-pay

## 2019-06-25 ENCOUNTER — Ambulatory Visit: Payer: Medicare PPO | Admitting: Internal Medicine

## 2019-06-25 VITALS — BP 122/80 | HR 82 | Temp 98.2°F | Wt 171.6 lb

## 2019-06-25 DIAGNOSIS — F4322 Adjustment disorder with anxiety: Secondary | ICD-10-CM | POA: Diagnosis not present

## 2019-06-25 DIAGNOSIS — R413 Other amnesia: Secondary | ICD-10-CM

## 2019-06-25 HISTORY — DX: Other amnesia: R41.3

## 2019-06-25 NOTE — Assessment & Plan Note (Signed)
Strongly advised her to start counseling to help with her stress and to encourage her husband to consider this as well or potentially couples counseling but definitely to get herself help even if he is not willing to consider.

## 2019-06-25 NOTE — Progress Notes (Signed)
   Subjective:   Patient ID: Theresa Perez, female    DOB: [birth year], 69 y.o.   MRN: [id number]  HPI The patient is a 69 YO female coming in for several concerns including adjustment disorder (having a lot of problems with her husband and taking cymbalta, denies SI/HI, is thinking about separation depending on if her husband is willing to work with her, she has a lot of stress with family as well, forces herself to do self care and this does help some) and memory concerns (multiple family members with alzheimer's, she feels this is just normal but her grand daughter is concerned and wanted her to be evaluated, no problems with getting lost but typically uses gps standard with driving).   Review of Systems  Constitutional: Negative.   HENT: Negative.   Eyes: Negative.   Respiratory: Negative for cough, chest tightness and shortness of breath.   Cardiovascular: Negative for chest pain, palpitations and leg swelling.  Gastrointestinal: Negative for abdominal distention, abdominal pain, constipation, diarrhea, nausea and vomiting.  Musculoskeletal: Negative.   Skin: Negative.   Neurological: Negative.   Psychiatric/Behavioral: Positive for decreased concentration and dysphoric mood.    Objective:  Physical Exam Constitutional:      Appearance: She is well-developed.  HENT:     Head: Normocephalic and atraumatic.  Cardiovascular:     Rate and Rhythm: Normal rate and regular rhythm.  Pulmonary:     Effort: Pulmonary effort is normal. No respiratory distress.     Breath sounds: Normal breath sounds. No wheezing or rales.  Abdominal:     General: Bowel sounds are normal. There is no distension.     Palpations: Abdomen is soft.     Tenderness: There is no abdominal tenderness. There is no rebound.  Musculoskeletal:     Cervical back: Normal range of motion.  Skin:    General: Skin is warm and dry.  Neurological:     Mental Status: She is alert and oriented to person, place, and  time.     Coordination: Coordination normal.     Vitals:   06/25/19 1019  BP: 122/80  Pulse: 82  Temp: 98.2 F (36.8 C)  SpO2: 98%  Weight: 171 lb 9.6 oz (77.8 kg)    This visit occurred during the SARS-CoV-2 public health emergency.  Safety protocols were in place, including screening questions prior to the visit, additional usage of staff PPE, and extensive cleaning of exam room while observing appropriate contact time as indicated for disinfecting solutions.   Assessment & Plan:  Visit time 30 minutes in face to face communication with patient and coordination of care, additional 5 minutes spent in record review, coordination or care, ordering tests, communicating/referring to other healthcare professionals, documenting in medical records all on the same day of the visit for total time 35 minutes spent on the visit.

## 2019-06-25 NOTE — Assessment & Plan Note (Signed)
Refer for neuropsych evaluation and MRI brain to assess. There is strong family history of alzheimer's and she would likely benefit from early intervention.

## 2019-06-25 NOTE — Patient Instructions (Addendum)
You can call for counseling and you do not need a referral for this.  We can do the memory test and the MRI.

## 2019-07-06 ENCOUNTER — Other Ambulatory Visit: Payer: Self-pay | Admitting: Internal Medicine

## 2019-07-06 DIAGNOSIS — Z1231 Encounter for screening mammogram for malignant neoplasm of breast: Secondary | ICD-10-CM

## 2019-07-08 ENCOUNTER — Other Ambulatory Visit: Payer: Self-pay | Admitting: Internal Medicine

## 2019-07-08 DIAGNOSIS — E890 Postprocedural hypothyroidism: Secondary | ICD-10-CM

## 2019-07-09 ENCOUNTER — Telehealth: Payer: Self-pay

## 2019-07-09 ENCOUNTER — Other Ambulatory Visit: Payer: Self-pay | Admitting: Internal Medicine

## 2019-07-09 DIAGNOSIS — E890 Postprocedural hypothyroidism: Secondary | ICD-10-CM

## 2019-07-09 NOTE — Telephone Encounter (Signed)
New message   Last seen  5.7.2021  The patient voiced wanted to see if Dr.Crawford would write her a prescription for levothyroxine (SYNTHROID) 100 MCG tablet, patient voiced she only has a few pills left, the medication was prescribed by Dr. Annye Asa   Dr. Arman Filter office advises the patient she would need to make an appt to see MD get a medication refill, patient declined the appt stated she only wanted to see Dr. Sharlet Salina.   Advise patient Dr. Sharlet Salina would need to see her regarding her request for medication that was ordered by another MD, Patient voiced she also wanted to know if Dr. Sharlet Salina would order her labs work.   No future appt is made at this time.

## 2019-07-12 MED ORDER — LEVOTHYROXINE SODIUM 100 MCG PO TABS: 100.0000 ug | ORAL_TABLET | Freq: Every day | ORAL | 1 refills | Status: DC

## 2019-07-12 NOTE — Telephone Encounter (Signed)
Physical scheduled for 08-16-2019 patient is aware

## 2019-07-12 NOTE — Telephone Encounter (Signed)
Sent in supply until physical due (end of June) so should schedule for that to continue.

## 2019-07-23 ENCOUNTER — Encounter: Payer: Medicare PPO | Admitting: Psychology

## 2019-07-24 ENCOUNTER — Other Ambulatory Visit: Payer: Self-pay

## 2019-07-24 ENCOUNTER — Ambulatory Visit
Admission: RE | Admit: 2019-07-24 | Discharge: 2019-07-24 | Disposition: A | Discharge status: Home / Self Care | Payer: Medicare PPO | Source: Ambulatory Visit | Attending: Internal Medicine | Admitting: Internal Medicine

## 2019-07-24 DIAGNOSIS — R413 Other amnesia: Secondary | ICD-10-CM | POA: Diagnosis not present

## 2019-07-26 ENCOUNTER — Ambulatory Visit: Payer: Medicare PPO

## 2019-07-26 ENCOUNTER — Other Ambulatory Visit: Payer: Self-pay

## 2019-07-26 ENCOUNTER — Encounter: Payer: Self-pay | Admitting: Counselor

## 2019-07-26 ENCOUNTER — Ambulatory Visit (INDEPENDENT_AMBULATORY_CARE_PROVIDER_SITE_OTHER): Payer: Medicare PPO | Admitting: Counselor

## 2019-07-26 DIAGNOSIS — F09 Unspecified mental disorder due to known physiological condition: Secondary | ICD-10-CM

## 2019-07-26 DIAGNOSIS — F4323 Adjustment disorder with mixed anxiety and depressed mood: Secondary | ICD-10-CM | POA: Diagnosis not present

## 2019-07-26 DIAGNOSIS — Z630 Problems in relationship with spouse or partner: Secondary | ICD-10-CM

## 2019-07-26 NOTE — Progress Notes (Signed)
   Psychometrist Note   Cognitive testing was administered to Theresa Perez by Lamar Benes, B.S. (Technician) under the supervision of Alphonzo Severance, Psy.D., ABN. [name] was able to tolerate all test procedures. Dr. Nicole Kindred met with the patient as needed to manage any emotional reactions to the testing procedures (if applicable). Rest breaks were offered.    The battery of tests administered was selected by Dr. Nicole Kindred with consideration to the patient's current level of functioning, the nature of her symptoms, emotional and behavioral responses during the interview, level of literacy, observed level of motivation/effort, and the nature of the referral question. This battery was communicated to the psychometrist. Communication between Dr. Nicole Kindred and the psychometrist was ongoing throughout the evaluation and Dr. Nicole Kindred was immediately accessible at all times. Dr. Nicole Kindred provided supervision to the technician on the date of this service, to the extent necessary to assure the quality of all services provided.    Theresa Perez will return in approximately one week for an interactive feedback session with Dr. Nicole Kindred, at which time female test performance, clinical impressions, and treatment recommendations will be reviewed in detail. The patient understands she can contact our office should she require our assistance before this time.   A total of 115 minutes of billable time were spent with Theresa Perez by the technician, including test administration and scoring time. Billing for these services is reflected in Dr. Les Pou note.   This note reflects time spent with the psychometrician and does not include test scores, clinical history, or any interpretations made by Dr. Nicole Kindred. The full report will follow in a separate note.

## 2019-07-26 NOTE — Progress Notes (Signed)
[facility]  Patient Name: Theresa Perez MRN: [id number] Date of Birth: 04/12/1950 Age: 69 y.o. Education: 14 years  Referral Circumstances and Background Information  Theresa Perez is a 69 y.o., left-hand dominant, married woman with a history of adjustment issues/depression (having a lot of problems with her husband), a strong family history of dementia, and concerns about her memory on the part of her granddaughter. She was referred by her PCP, Dr. Sharlet Salina for diagnosis and potentially also early intervention.    On interview, the patient stated that her granddaughter is the main reason why she is here, and she has been concerned about the patient's memory over about the past year. The patient feels like "for the most part I am good," although she does acknowledge that she forgets things at times. She thinks it is mainly because she is not always paying attention. It sounds like her main symptom, as reported by her graddaughter, involves forgetting conversations that they have had. The patient thinks a lot of this is related to COVID, because she went from being very active (playing Tennis, going to church, etc.) to sitting at home with her husband who is depressed. The patient stated that she mainly forgets details of things, as per her granddaughter, but not entire events. With respect to mood, the patient attributed most of her issues to her relationship with her husband. She likes to be around people, to do things, and to go places and her husband sounds like he is more of a home body. It sounds like she feels good when she is out doing things but then feels sad when she is at home. She does admit to some anxiety, she feels worked up sometimes and just doesn't know why. She stated that she is usually getting about 8 hours of sleep. She stated that her energy is less than it was in the past but is adequate. She stated that her appetite is less than it was  but she is eating at least two meals a day.   I was able to talk with the patient's granddaughter Theresa Perez, who largely corroborated the concerns noted above. She has noticed minor memory loss over perhaps the past 6 months. This is mainly the patient forgetting conversations they have had and then arguing with her over whether they talked about it or not. She also perceives the patient to be more depressed, than in the past, starting during [place]. She hasn't noticed any dramatic changes although she has a lot of family experience with dementia and therefore, wanted to get the patient's issues checked out.   With respect to functioning, the patient largely denied any problems. She said she is a detail oriented person and tends to write things down. She denied that she is forgetting medications, forgetting medical appointments, or having difficulties driving. She and her husband both manage their own finances, and she stated that she isn't having any problems with that. She is still able to do things around the house such as doing the laundry, cleaning, and she cooks some. She denied any problems using her smart phone or using her computer. She denied any problems with grocery shopping or community utilization. She also denied any problems with orientation.   Past Medical History and Review of Relevant Studies   Patient Active Problem List   Diagnosis Date Noted  . Memory change 06/25/2019  . Smokers' cough ([place]) 12/11/2017  . De Quervain's disease (radial styloid tenosynovitis) 04/01/2017  .  Arthritis of left knee 02/22/2015  . Routine general medical examination at a health care facility 12/16/2014  . Constipation 07/13/2014  . Papillary thyroid carcinoma ([place]) 03/23/2014  . Pure hypercholesterolemia 12/08/2013  . Varicose veins of lower extremities with other complications 55/73/2202  . ADJUSTMENT DISORDER WITH ANXIETY 03/03/2009  . Essential hypertension 05/19/2007    Review of Neuroimaging  and Relevant Medical History: :  The patient has an MRI brain from 07/24/2019 that shows mild involutional changes and mild leukoaraiosis. On some cuts appears to be a small amount of pulling in the frontal horns (R>L), although I think it is of dubious clinical significance.   Current Outpatient Medications  Medication Sig Dispense Refill  . Ascorbic Acid (VITAMIN C PO) Take 1 tablet by mouth daily.    [name] atenolol-chlorthalidone (TENORETIC) 50-25 MG tablet Take 1/2 (one-half) tablet by mouth once daily 45 tablet 1  . Black Cohosh 540 MG CAPS Take 540 mg by mouth 2 (two) times daily.     . butalbital-acetaminophen-caffeine (ESGIC) 50-325-40 MG tablet Take 1 tablet by mouth 2 (two) times daily as needed for headache. 30 tablet 1  . CALCIUM PO Take 1 tablet by mouth daily.    [name] COENZYME Q-10 PO Take 1 tablet by mouth daily.    . Cyanocobalamin (VITAMIN B-12 PO) Take 1 tablet by mouth daily.    . DULoxetine (CYMBALTA) 60 MG capsule Take 1 capsule by mouth twice daily 180 capsule 1  . ECHINACEA EXTRACT PO Take by mouth.    . EVENING PRIMROSE OIL PO Take by mouth.    . ferrous sulfate 325 (65 FE) MG tablet Take 325 mg by mouth every evening.     . Flaxseed, Linseed, (FLAX SEED OIL PO) Take by mouth.    . fluticasone (FLONASE) 50 MCG/ACT nasal spray Use 2 spray(s) in each nostril once daily 48 g 2  . Ginkgo Biloba (GNP GINGKO BILOBA EXTRACT PO) Take 1 tablet by mouth daily.    [name] levothyroxine (SYNTHROID) 100 MCG tablet Take 1 tablet (100 mcg total) by mouth daily. 30 tablet 1  . LORazepam (ATIVAN) 0.5 MG tablet TAKE 1/2 (ONE-HALF) TABLET BY MOUTH ONCE DAILY AS NEEDED FOR ANXIETY 20 tablet 0  . Misc Natural Products (GLUCOSAMINE CHOND COMPLEX/MSM) TABS Take 1 tablet by mouth 2 (two) times daily.    . Multiple Vitamins-Minerals (MULTIVITAMIN WITH MINERALS) tablet Take 1 tablet by mouth daily.    . Nutritional Supplements (DHEA PO) Take 1 tablet by mouth daily.    . Turmeric (QC TUMERIC COMPLEX PO)  Take by mouth.     No current facility-administered medications for this visit.   The patient is on the potentially brain impairing medications: Lorazepam and Fioricet. She stated that she takes 1/4 a Lorazepam once in a while but not frequently. She estimated that she takes it every few months. She no longer takes Fioricet.   Family History  Problem Relation Age of Onset  . Diabetes Mother   . Deep vein thrombosis Mother        Varicose Veins  . Cancer Father        Father died from brain tumor.  [name] Heart disease Father   . Colon cancer Neg Hx   . Stomach cancer Neg Hx    There is a family history of dementia. The patient's mother developed the condition in her 29s, it was felt to be Alzheimer's. In total, 5/13 siblings on that side of the family developed the condition, also  later in life (she wasn't sure of their exact ages). A great uncle on that side of the family also developed dementia. The patient has one brother who passed away in his 97s. There is a family history of psychiatric illness, her father had PTSD and apparently required ECT. Her brother also had some "mental illness," but she was vague about it.   Psychosocial History  Developmental, Educational and Employment History: The patient is a native of [place]. The patient stated that she was a decent student who did well, was never held back and had no learning problems. She was 13th in a class of 400. She went on to get an Associate's Degree from [employer] in [place] reporting. 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 She retired in 2014.   Psychiatric History:  The patient has a history of mental health treatment, she was having some issues with depression related to problems with her children (son was into drugs) in 2011. She stated that she was a psychiatric inpatient for about 3 days. After discharge, she got on Cymbalta at that  time and was involved in counseling for 5 or 6 months. She stated that she wasn't sleeping well at all at that time but denied other symptoms of hypomania, it sounds like she was more very depressed and felt tired all the time.    Substance Use History: The patient doesn't drink regularly, she does smoke, less than half a pack a day. She is trying to quit. She has been smoking for about 10 years. She denied using any illicit drugs.  5 Relationship History and Living Cimcumstances: The patient has been married for nearly 44 years, it sounds like it's not the happiest marriage. She has one child, a son, who is 26. She stated that "he says something every once in a while" regarding her memory.   Mental Status and Behavioral Observations  Sensorium/Arousal: The patient's level of arousal was awake and alert. Hearing and vision were adequate for testing purposes. Orientation: The patient was alert and fully oriented to person, place, time, and situation.  Appearance: Dressed appropriately in casual clothing with good grooming and hygiene  Behavior: The patient presented as a bit pressured in her history giving but was otherwise pleasant and appropriate.  Speech/language: Speech was fast in rate, normal in rhythm, volume, and prosody.  Gait/Posture: Gait was not well observed, no obvious abnormalities on ambulation within the cliunic. Movement: No overt signs/symptoms of movement disorder Social Comportment: The patient was pleasant, appropriate, and presented as a fairly reliable historian.  Mood: "I know I have some issues with depression."  Affect: Anxious Thought process/content: The patient's thought process was expansive at times although with some redirection she was able to provide a reasonable detailed clinical history and timeline.  Safety: No thoughts of harming self or others on direct questioning.  Insight: [facility]ide Cognitive Assessment  07/26/2019  Visuospatial/ Executive (0/5) 5    Naming (0/3) 3  Attention: Read list of digits (0/2) 2  Attention: Read list of letters (0/1) 1  Attention: Serial 7 subtraction starting at 100 (0/3) 3  Language: Repeat phrase (0/2) 2  Language : Fluency (0/1) 1  Abstraction (0/2) 1  Delayed Recall (0/5) 5  Orientation (0/6) 6  Total 29   Test Procedures  Wide Range Achievement Test - 4             Word  Reading Neuropsychological Assessment Battery  List Learning  Story Learning  Daily Living Memory  Naming  Digit Span Repeatable Battery for the Assessment of Neuropsychological Status (Form A)  Figure Copy  Judgment of Line Orientation  Coding  Figure Recall The Dot Counting Test A Random Letter Test Controlled Oral Word Association (F-A-S) Semantic Fluency (Animals) Trail Making Test A & B Complex Ideational Material Modified [place] Card Sorting Test Geriatric Depression Scale - Short Form  Plan  JEROLINE WOLBERT was seen for a psychiatric diagnostic evaluation and neuropsychological testing. She has a history of mood difficulties with increasing issues since COVID, some relational issues, and memory problems noticed mainly by her daughter. She is screening in the normal range on the MoCA and presented as having no more than very mild issues (if that) on preliminary review of the test data. Full and complete note with impressions, recommendations, and interpretation of test data to follow.   [name] Nicole Kindred, PsyD, [place] Clinical Neuropsychologist  Informed Consent and Coding/Compliance  Risks and benefits of the evaluation were discussed with the patient prior to all testing procedures. I conducted a clinical interview and neuropsychological testing (at least two tests) with Genoveva Ill and Lamar Benes, B.S. (Technician) assisted me in administering additional test procedures. The patient was able to tolerate the testing procedures and the patient (and/or family if applicable) is likely to benefit from  further follow up to receive the diagnosis and treatment recommendations, which will be rendered at the next encounter. Billing below reflects technician time, my direct face-to-face time with the patient, time spent in test administration, and time spent in professional activities including but not limited to: neuropsychological test interpretation, integration of neuropsychological test data with clinical history, report preparation, treatment planning, care coordination, and review of diagnostically pertinent medical history or studies.   Services associated with this encounter: Clinical Interview [phone]) plus 60 minutes (09735; Neuropsychological Evaluation by Professional)  120 minutes (32992; Neuropsychological Evaluation by Professional, Adl.) 21 minutes (42683; Test Administration by Professional) 30 minutes (41962; Neuropsychological Testing by Technician) 85 minutes (22979; Neuropsychological Testing by Technician, Adl.)

## 2019-07-27 NOTE — Progress Notes (Signed)
[facility]  Patient Name: Theresa Perez MRN: [id number] Date of Birth: 1950-04-22 Age: 69 y.o. Education: 14 years  Measurement properties of test scores: IQ, Index, and Standard Scores (SS): Mean = 100; Standard Deviation = 15 Scaled Scores (Ss): Mean = 10; Standard Deviation = 3 Z scores (Z): Mean = 0; Standard Deviation = 1 T scores (T); Mean = 50; Standard Deviation = 10  TEST SCORES:    Note: This summary of test scores accompanies the interpretive report and should not be interpreted by unqualified individuals or in isolation without reference to the report. Test scores are relative to age, gender, and educational history as available and appropriate.   Performance Validity        "A" Random Letter Test Raw  Descriptor      Errors 0 Within Expectation  The Dot Counting Test: 9 Within Expectation      Mental Status Screening     Total Score Descriptor  MoCA 29 Normal      Expected Functioning        Wide Range Achievement Test: Standard/Scaled Score Percentile      Word Reading 104 61      Attention/Processing Speed        Neuropsychological Assessment Battery (Attention Module, Form 1): T-score Percentile      Digits Forward 53 62      Digits Backwards 49 46      Repeatable Battery for the Assessment of Neuropsychological Status (Form A): Scaled Score Percentile      Coding 10 50      Language        Neuropsychological Assessment Battery (Language Module, Form 1): T-score Percentile      Naming   (30) 56 73      Verbal Fluency: T-score Percentile      Controlled Oral Word Association (F-A-S) 45 31      Semantic Fluency (Animals) 46 34      Memory:        Neuropsychological Assessment Battery (Memory Module, Form 1): T-score Percentile      List Learning           List A Immediate Recall   (5, 10, 9) 52 58         List B Immediate Recall   (6) 59 82         List A Short Delayed Recall   (7) 48 42         List  A Long Delayed Recall   (7) 49 46         List A Long Delayed Yes/No Recognition Hits   (12) --- 79         List A Long Delayed Yes/No Recognition False Alarms   (5) --- 31         List A Recognition Discriminability Index --- 46     Story Learning           Immediate Recall   (19, 31) 38 12         Delayed Recall   (25) 38 12      Daily Living Memory            Immediate Recall   (22, 21) 51 54          Delayed Recall   (7, 7) 49 46          Recognition Hits    (9) --- 58  Repeatable Battery for the Assessment of Neuropsychological Status (Form A): Scaled Score Percentile         Figure Recall   (7) 5 5      Visuospatial/Constructional Functioning        Repeatable Battery for the Assessment of Neuropsychological Status (Form A): Standard/Scaled Score Percentile     Visuospatial/Constructional Index 112 79         Figure Copy   (20) 14 91         Judgment of Line Orientation   (17) --- 51-75      Executive Functioning        Modified [employer] Test (MWCST): Standard/T-Score Percentile      Number of Categories Correct 57 76      Number of Perseverative Errors 53 62      Number of Total Errors 52 58      Percent Perseverative Errors 52 58  Executive Function Composite 108 71      Trail Making Test: T-Score Percentile      Part A 55 69      Part B 53 62      Boston Diagnostic Aphasia Exam: Raw Score Scaled Score      Complex Ideational Material 11 9      Clock Drawing Raw Score Descriptor      Command 10 WNL      Rating Scales         Raw Score Descriptor  Patient Health Questionnaire - 9 5 Mild Depression  GAD-7 5 Mild Anxiety  Geriatric Depression Scale - Short Form 4 WNL   Sacora Hawbaker V. Nicole Kindred PsyD, [place] Clinical Neuropsychologist

## 2019-08-02 ENCOUNTER — Other Ambulatory Visit: Payer: Self-pay

## 2019-08-02 ENCOUNTER — Encounter: Payer: Self-pay | Admitting: Counselor

## 2019-08-02 ENCOUNTER — Ambulatory Visit (INDEPENDENT_AMBULATORY_CARE_PROVIDER_SITE_OTHER): Payer: Medicare PPO | Admitting: Counselor

## 2019-08-02 DIAGNOSIS — F32A Depression, unspecified: Secondary | ICD-10-CM | POA: Insufficient documentation

## 2019-08-02 DIAGNOSIS — F4323 Adjustment disorder with mixed anxiety and depressed mood: Secondary | ICD-10-CM

## 2019-08-02 NOTE — Progress Notes (Signed)
[facility]  I met with Theresa Perez to review the findings resulting from her neuropsychological evaluation. Since the last appointment, things have been about the same. She went to [place] to visit her family and had a good time. Time was spent reviewing the impressions and recommendations that are detailed in the evaluation report. We discussed impression of essentially normal cognitive function on neuropsych testing. We discussed that family history of Alzheimer's can cause individuals to overattribute normal cognitive errors to decline when none has occurred. I also explained that it is possible there are very subtle changes that are not detectable by neuropsychological means. We discussed counseling, they weren't interested in couples counseling but Theresa Perez accepted a referral for individual psychotherapy. I took time to explain the findings and answer all the patient's questions. I encouraged Theresa Perez to contact me should she have any further questions or if further follow up is desired.   Current Medications and Medical History   Current Outpatient Medications  Medication Sig Dispense Refill  . Ascorbic Acid (VITAMIN C PO) Take 1 tablet by mouth daily.    [name] atenolol-chlorthalidone (TENORETIC) 50-25 MG tablet Take 1/2 (one-half) tablet by mouth once daily 45 tablet 1  . Black Cohosh 540 MG CAPS Take 540 mg by mouth 2 (two) times daily.     . butalbital-acetaminophen-caffeine (ESGIC) 50-325-40 MG tablet Take 1 tablet by mouth 2 (two) times daily as needed for headache. 30 tablet 1  . CALCIUM PO Take 1 tablet by mouth daily.    [name] COENZYME Q-10 PO Take 1 tablet by mouth daily.    . Cyanocobalamin (VITAMIN B-12 PO) Take 1 tablet by mouth daily.    . DULoxetine (CYMBALTA) 60 MG capsule Take 1 capsule by mouth twice daily 180 capsule 1  . ECHINACEA EXTRACT PO Take by mouth.    . EVENING PRIMROSE OIL PO Take by mouth.    . ferrous sulfate  325 (65 FE) MG tablet Take 325 mg by mouth every evening.     . Flaxseed, Linseed, (FLAX SEED OIL PO) Take by mouth.    . fluticasone (FLONASE) 50 MCG/ACT nasal spray Use 2 spray(s) in each nostril once daily 48 g 2  . Ginkgo Biloba (GNP GINGKO BILOBA EXTRACT PO) Take 1 tablet by mouth daily.    [name] levothyroxine (SYNTHROID) 100 MCG tablet Take 1 tablet (100 mcg total) by mouth daily. 30 tablet 1  . LORazepam (ATIVAN) 0.5 MG tablet TAKE 1/2 (ONE-HALF) TABLET BY MOUTH ONCE DAILY AS NEEDED FOR ANXIETY 20 tablet 0  . Misc Natural Products (GLUCOSAMINE CHOND COMPLEX/MSM) TABS Take 1 tablet by mouth 2 (two) times daily.    . Multiple Vitamins-Minerals (MULTIVITAMIN WITH MINERALS) tablet Take 1 tablet by mouth daily.    . Nutritional Supplements (DHEA PO) Take 1 tablet by mouth daily.    . Turmeric (QC TUMERIC COMPLEX PO) Take by mouth.     No current facility-administered medications for this visit.    Patient Active Problem List   Diagnosis Date Noted  . Memory change 06/25/2019  . Smokers' cough ([place]) 12/11/2017  . De Quervain's disease (radial styloid tenosynovitis) 04/01/2017  . Arthritis of left knee 02/22/2015  . Routine general medical examination at a health care facility 12/16/2014  . Constipation 07/13/2014  . Papillary thyroid carcinoma ([place]) 03/23/2014  . Pure hypercholesterolemia 12/08/2013  . Varicose veins of lower extremities with other complications 01/00/7121  . ADJUSTMENT DISORDER WITH ANXIETY 03/03/2009  . Essential  hypertension 05/19/2007    Mental Status and Behavioral Observations  [name] was available at the prespecified time for this telephonic appointment and was alert and generally oriented (orientation not formally assessed). Speech was normal in rate, rhythm, volume, and prosody. Self-reported mood was "good" and affect as assessed by vocal quality was neutral. Thought process was logical, linear, and goal directed and thought content was appropriate to  the topics discussed. There were no safety concerns identified at today's encounter, such as thoughts of harming self or others.   Plan  Feedback provided regarding the patient's neuropsychological evaluation. She was offered and accepted a referral for psychotherapy. Would likely benefit from couples counseling but her husband isn't interested. We discussed healthy lifestyle changes including increasing activity level, staying mentally active. SHAKARA Perez was encouraged to contact me if any questions arise or if further follow up is desired.   [name] Nicole Kindred, PsyD, ABN Clinical Neuropsychologist  This service was rendered telephonically. The patient consented to the visit modality: yes The patient location was: home The provider location was: office  Service(s) Provided at This Encounter: 40 minutes (27062; Conjoint therapy with patient present)

## 2019-08-02 NOTE — Progress Notes (Signed)
[facility]  Patient Name: Theresa Perez MRN: [id number] Date of Birth: 1950-05-10 Age: 69 y.o. Education: 14 years  Clinical Impressions  Theresa Perez is a 69 y.o., left-hand dominant, married woman with a history of adjustment issues/depression that she attributes to her husband, a strong family history of dementia, and concerns about dementia on the part of her granddaughter. The patient herself does not feel as though she has significant memory problems. I was able to speak with her granddaughter by phone, who has noticed fairly subtle symptoms, mainly involving forgettign. She has family experience with Alzheimer's and wants to make sure that the patient isn't going down the same path. Since her evaluation, the patient has had an MRI of the brain (07/20/2019) that was unremarkable. There were mild areas of leukoaraiosis and there is mild volume loss. On my review, hippocampal volume appears fairly well preserved.   This is a normal neuropsychological study. Specifically, Theresa Perez performed within normal limits in all areas assessed. She demonstrated adequate acquisition and retention of information across time and there are no areas of performance that are concerning for significant decline. She reported mild levels of symptoms associated with depression and anxiety and admits that she has had a hard time keeping up her spirits since COVID. It is possible that she has very mild cognitive decline that is not detectable by neuropsychological methods but it is also possible that her granddaughter is oversensitized to cognitive problems related to her family history and is misattributing normal cognitive errors.  Diagnostic Impressions: Cognitive complaints with normal neuropsychological exam Adjustment disorder with mixed anxiety and depressed mood Partner relational problem  Recommendations to be discussed with patient  Your performance and  presentation on assessment today were consistent with normal performance. That is, you did not have any cognitive domains clearly falling below expectations for someone of your presumed ability level. This is good, and means there is no objective evidence of decline. One possibility is that your granddaughter is oversensitive to cognitive problems because of her experience with dementia and she is misattributing normal cognitive problems to cognitive decline. The possibility that there is a very subtle issue that is not neuropsychologically detectable yet cannot be entirely ruled out but at least for now, there is no objective evidence of decline.   You reported mild yet clinically significant levels of affective symptoms and it sounds like your marriage is a significant source of stress in your life. You are already on medication, which can be helpful. You may also consider couples counseling to work on those issues.   There is a significant research base and evidence of effectiveness for something called "behavioral activation," which is a fancy way of saying that you should increase your activity level. In general, people do not feel as happy or do as well when they are not doing much. This can include things like getting out for walks, re-engaging in hobbies, spending time with family or friends, or learning a new hobby. It's not so important what you do as that you enjoy it and stick with it. Depression can start a vicious cycle where you are not doing a lot because you don't feel well, which leads to less things to be excited and happy about, and thus more depression and behavioral avoidance. It can be hard to change this pattern once it has started but most people find that they feel better when they start doing more even if they don't enjoy  it at first.   Some cognitive abilities (such as processing speed) naturally decline with age, but there are many things you can do to contribute to healthy  cognitive aging. There is evidence from at least one study that a modified low carbohydrate mediterranean diet (the MIND-DASH) diet can contribute to healthy cognitive aging. There is also some evidence to suggest a beneficial effect of coffee (black coffee without sugar or other additives such as cream) for healthy aging. One glass of red wine a day may also contribute to healthy aging though at two drinks you lose all benefit and at three drinks it may be doing more harm than good. Staying active, mentally and physically, are also crucially important. One of the best ways to do this is simply to stay active and engaged in your life, particularly with social activities. Challenging the mind and other cognitively stimulating activities are encouraged, consider learning a new hobby, reconnecting with old friends, reading an interesting and thought provoking book. It is not so much what you do that is important as it is that you enjoy it and stay at it.   Test Findings  Test scores are summarized in additional documentation associated with this encounter. Test scores are relative to age, gender, and educational history as available and appropriate. There were no concerns about performance validity as all findings fell within normal expectations.   General Intellectual Functioning/Achievement:  Performance on single word reading was average, which presents as a reasonable standard of comparison for this patient's cognitive test data.  Attention and Processing Efficiency: Performance was within normal limits on measures of attention and working memory with average digit repetition forward and digit repetition backward.   With respect to processing efficiency, Theresa Perez demonstrated average timed number-symbol coding and simple numeric sequencing.   Language: Language findings suggested adequate abilities with normal visual object confrontation naming and average verbal fluency.   Visuospatial  Function: Performance on visuospatial and constructional measures was good, with a high average score on the overall index. Figure copy was superior and judgment of angular line orientations was average to high average.   Learning and Memory: Performance on measures of learning and memory suggested good acquisition and retention of information across time. She did generate two weak scores on short story learning and recall but retention of information was reasonable across time and the number of low scores is not unusual for a person of average ability.   In the verbal realm, she learned 5, 10, and 9 words of a 12-item list across three repetitions, which is average. Her short and long delayed recall were both average at 7 words recalled. Short story memory was a bit weaker with low average immediate and delayed recall. Memory for brief daily-living type information was average on immediate recall and delayed recall. Recognition discriminability for the information contained amongst distractor alternate items was also average.   Executive Functions: Performance on executive measures was within reasonable expectations with average scores in all areas, including on the [employer], alternating sequencing of numbers and letters of the alphabet, on generation of words in response to given letters, and when reasoning with verbal information.   Rating Scale(s): Ms. Galiano reported mild yet potentially clinically significant levels of symptoms associated with depression and anxiety.   [name] Nicole Kindred PsyD, [place] Clinical Neuropsychologist

## 2019-08-02 NOTE — Patient Instructions (Signed)
Your performance and presentation on assessment today were consistent with normal performance. That is, you did not have any cognitive domains clearly falling below expectations for someone of your presumed ability level. This is good, and means there is no objective evidence of decline. One possibility is that your granddaughter is oversensitive to cognitive problems because of her experience with dementia and she is misattributing normal cognitive problems to cognitive decline. The possibility that there is a very subtle issue that is not neuropsychologically detectable yet cannot be entirely ruled out but at least for now, there is no objective evidence of decline.   You reported mild yet clinically significant levels of affective symptoms and it sounds like your marriage is a significant source of stress in your life. You are already on medication, which can be helpful. We discussed a referral to [facility] for counseling, they should be in touch about an appointment.   There is a significant research base and evidence of effectiveness for something called "behavioral activation," which is a fancy way of saying that you should increase your activity level. In general, people do not feel as happy or do as well when they are not doing much. This can include things like getting out for walks, re-engaging in hobbies, spending time with family or friends, or learning a new hobby. It's not so important what you do as that you enjoy it and stick with it. Depression can start a vicious cycle where you are not doing a lot because you don't feel well, which leads to less things to be excited and happy about, and thus more depression and behavioral avoidance. It can be hard to change this pattern once it has started but most people find that they feel better when they start doing more even if they don't enjoy it at first.   Some cognitive abilities (such as processing speed) naturally decline with  age, but there are many things you can do to contribute to healthy cognitive aging. There is evidence from at least one study that a modified low carbohydrate mediterranean diet (the MIND-DASH) diet can contribute to healthy cognitive aging. There is also some evidence to suggest a beneficial effect of coffee (black coffee without sugar or other additives such as cream) for healthy aging. One glass of red wine a day may also contribute to healthy aging though at two drinks you lose all benefit and at three drinks it may be doing more harm than good. Staying active, mentally and physically, are also crucially important. One of the best ways to do this is simply to stay active and engaged in your life, particularly with social activities. Challenging the mind and other cognitively stimulating activities are encouraged, consider learning a new hobby, reconnecting with old friends, reading an interesting and thought provoking book. It is not so much what you do that is important as it is that you enjoy it and stay at it.

## 2019-08-04 ENCOUNTER — Other Ambulatory Visit: Payer: Self-pay

## 2019-08-04 ENCOUNTER — Ambulatory Visit
Admission: RE | Admit: 2019-08-04 | Discharge: 2019-08-04 | Disposition: A | Discharge status: Home / Self Care | Payer: Medicare PPO | Source: Ambulatory Visit | Attending: Internal Medicine | Admitting: Internal Medicine

## 2019-08-04 DIAGNOSIS — Z1231 Encounter for screening mammogram for malignant neoplasm of breast: Secondary | ICD-10-CM | POA: Diagnosis not present

## 2019-08-13 ENCOUNTER — Other Ambulatory Visit: Payer: Self-pay | Admitting: Internal Medicine

## 2019-08-13 DIAGNOSIS — F4322 Adjustment disorder with anxiety: Secondary | ICD-10-CM

## 2019-08-16 ENCOUNTER — Ambulatory Visit (INDEPENDENT_AMBULATORY_CARE_PROVIDER_SITE_OTHER): Payer: Medicare PPO | Admitting: Internal Medicine

## 2019-08-16 ENCOUNTER — Other Ambulatory Visit: Payer: Self-pay

## 2019-08-16 ENCOUNTER — Ambulatory Visit (INDEPENDENT_AMBULATORY_CARE_PROVIDER_SITE_OTHER): Payer: Medicare PPO

## 2019-08-16 ENCOUNTER — Encounter: Payer: Self-pay | Admitting: Internal Medicine

## 2019-08-16 VITALS — BP 116/70 | HR 70 | Temp 98.0°F | Resp 16 | Ht 71.0 in | Wt 171.2 lb

## 2019-08-16 VITALS — BP 116/70 | HR 70 | Temp 98.0°F | Ht 71.0 in | Wt 171.0 lb

## 2019-08-16 DIAGNOSIS — I10 Essential (primary) hypertension: Secondary | ICD-10-CM | POA: Diagnosis not present

## 2019-08-16 DIAGNOSIS — F4323 Adjustment disorder with mixed anxiety and depressed mood: Secondary | ICD-10-CM

## 2019-08-16 DIAGNOSIS — Z0000 Encounter for general adult medical examination without abnormal findings: Secondary | ICD-10-CM

## 2019-08-16 DIAGNOSIS — C73 Malignant neoplasm of thyroid gland: Secondary | ICD-10-CM | POA: Diagnosis not present

## 2019-08-16 DIAGNOSIS — Z1211 Encounter for screening for malignant neoplasm of colon: Secondary | ICD-10-CM

## 2019-08-16 DIAGNOSIS — J410 Simple chronic bronchitis: Secondary | ICD-10-CM

## 2019-08-16 LAB — CBC
HCT: 39.2 % (ref 36.0–46.0)
Hemoglobin: 13.3 g/dL (ref 12.0–15.0)
MCHC: 34 g/dL (ref 30.0–36.0)
MCV: 90.1 fl (ref 78.0–100.0)
Platelets: 315 10*3/uL (ref 150.0–400.0)
RBC: 4.35 Mil/uL (ref 3.87–5.11)
RDW: 13.2 % (ref 11.5–15.5)
WBC: 7.8 10*3/uL (ref 4.0–10.5)

## 2019-08-16 LAB — COMPREHENSIVE METABOLIC PANEL
ALT: 29 U/L (ref 0–35)
AST: 35 U/L (ref 0–37)
Albumin: 4.4 g/dL (ref 3.5–5.2)
Alkaline Phosphatase: 44 U/L (ref 39–117)
BUN: 11 mg/dL (ref 6–23)
CO2: 30 mEq/L (ref 19–32)
Calcium: 9.3 mg/dL (ref 8.4–10.5)
Chloride: 101 mEq/L (ref 96–112)
Creatinine, Ser: 0.68 mg/dL (ref 0.40–1.20)
GFR: 86.09 mL/min (ref >60.00)
Glucose, Bld: 93 mg/dL (ref 70–99)
Potassium: 3.7 mEq/L (ref 3.5–5.1)
Sodium: 139 mEq/L (ref 135–145)
Total Bilirubin: 0.5 mg/dL (ref 0.2–1.2)
Total Protein: 6.8 g/dL (ref 6.0–8.3)

## 2019-08-16 LAB — LIPID PANEL
Cholesterol: 186 mg/dL (ref 0–200)
HDL: 49.5 mg/dL (ref >39.00)
LDL Cholesterol: 113 mg/dL — ABNORMAL HIGH (ref 0–99)
NonHDL: 136.29
Total CHOL/HDL Ratio: 4
Triglycerides: 115 mg/dL (ref 0.0–149.0)
VLDL: 23 mg/dL (ref 0.0–40.0)

## 2019-08-16 LAB — TSH: TSH: 4.82 u[IU]/mL — ABNORMAL HIGH (ref 0.35–4.50)

## 2019-08-16 LAB — T4, FREE: Free T4: 0.9 ng/dL (ref 0.60–1.60)

## 2019-08-16 MED ORDER — LORAZEPAM 0.5 MG PO TABS: ORAL_TABLET | ORAL | 2 refills | Status: DC

## 2019-08-16 NOTE — Assessment & Plan Note (Signed)
Flu shot yearly. Pneumonia complete. Shingrix counseled. Tetanus up to date. Colonoscopy referral placed. Mammogram up to date, pap smear aged out and dexa up to date. Counseled about sun safety and mole surveillance. Counseled about the dangers of distracted driving. Given 10 year screening recommendations.

## 2019-08-16 NOTE — Assessment & Plan Note (Signed)
BP at goal on atenolol/chlorthalidone. Checking CMP and adjust as needed.

## 2019-08-16 NOTE — Assessment & Plan Note (Signed)
Stable with rare sputum production in the morning. Reminded about the risks/harms of smoking and asked her to consider quitting but she does not feel able at this time.

## 2019-08-16 NOTE — Assessment & Plan Note (Addendum)
Checking TSH and free T4. Adjust synthroid 100 mcg daily as needed.  

## 2019-08-16 NOTE — Progress Notes (Signed)
Subjective:   DARCUS EDDS is a 69 y.o. female who presents for Medicare Annual (Subsequent) preventive examination.  Review of Systems    No ROS. Medicare Wellness Visit. Additional risk factors are reflected in social history. Cardiac Risk Factors include: advanced age (>34men, >66 women);family history of premature cardiovascular disease;hypertension;smoking/ tobacco exposure     Objective:    Today's Vitals   08/16/19 1041  BP: 116/70  Pulse: 70  Resp: 16  Temp: 98 F (36.7 C)  SpO2: 94%  Weight: 171 lb 3.2 oz (77.7 kg)  Height: 5\' 11"  (1.803 m)  PainSc: 0-No pain   Body mass index is 23.88 kg/m.  Advanced Directives 08/16/2019 11/17/2013 03/31/2012 03/31/2012 03/27/2012  Does Patient Have a Medical Advance Directive? No No Patient does not have advance directive Patient does not have advance directive Patient does not have advance directive  Would patient like information on creating a medical advance directive? Yes (MAU/Ambulatory/Procedural Areas - Information given) - - - -  Pre-existing out of facility DNR order (yellow form or pink MOST form) - - No No -    Current Medications (verified) Outpatient Encounter Medications as of 08/16/2019  Medication Sig  . Ascorbic Acid (VITAMIN C PO) Take 1 tablet by mouth daily.  [name] atenolol-chlorthalidone (TENORETIC) 50-25 MG tablet Take 1/2 (one-half) tablet by mouth once daily  . Black Cohosh 540 MG CAPS Take 540 mg by mouth 2 (two) times daily.   . butalbital-acetaminophen-caffeine (ESGIC) 50-325-40 MG tablet Take 1 tablet by mouth 2 (two) times daily as needed for headache.  [name] CALCIUM PO Take 1 tablet by mouth daily.  [name] COENZYME Q-10 PO Take 1 tablet by mouth daily.  . Cyanocobalamin (VITAMIN B-12 PO) Take 1 tablet by mouth daily.  . DULoxetine (CYMBALTA) 60 MG capsule Take 1 capsule by mouth twice daily  . ECHINACEA EXTRACT PO Take by mouth.  . EVENING PRIMROSE OIL PO Take by mouth.  . ferrous sulfate 325 (65 FE) MG  tablet Take 325 mg by mouth every evening.   . Flaxseed, Linseed, (FLAX SEED OIL PO) Take by mouth.  . fluticasone (FLONASE) 50 MCG/ACT nasal spray Use 2 spray(s) in each nostril once daily  . Ginkgo Biloba (GNP GINGKO BILOBA EXTRACT PO) Take 1 tablet by mouth daily.  [name] levothyroxine (SYNTHROID) 100 MCG tablet Take 1 tablet (100 mcg total) by mouth daily.  [name] LORazepam (ATIVAN) 0.5 MG tablet TAKE 1/2 (ONE-HALF) TABLET BY MOUTH ONCE DAILY AS NEEDED FOR ANXIETY  . Misc Natural Products (GLUCOSAMINE CHOND COMPLEX/MSM) TABS Take 1 tablet by mouth 2 (two) times daily.  . Multiple Vitamins-Minerals (MULTIVITAMIN WITH MINERALS) tablet Take 1 tablet by mouth daily.  . Nutritional Supplements (DHEA PO) Take 1 tablet by mouth daily.  . Turmeric (QC TUMERIC COMPLEX PO) Take by mouth.   No facility-administered encounter medications on file as of 08/16/2019.    Allergies (verified) Permethrin   History: Past Medical History:  Diagnosis Date  . Allergy   . Anxiety   . 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 Complication of anesthesia   . Cyst of breast, right, diffuse fibrocystic   . Hypertension   . Hypothyroidism   . PONV (postoperative nausea and vomiting)    Past Surgical History:  Procedure Laterality Date  . ANTERIOR AND POSTERIOR REPAIR N/A 03/31/2012   Procedure: ANTERIOR (CYSTOCELE) AND POSTERIOR REPAIR (RECTOCELE);  Surgeon: Gus Height, MD;  Location: [facility];  Service: Gynecology;  Laterality: N/A;  . aspiration  cyst right breast    . THYROIDECTOMY  1997  . TONSILLECTOMY  1969  . uterine ablation    . VAGINAL HYSTERECTOMY N/A 03/31/2012   Procedure: HYSTERECTOMY VAGINAL;  Surgeon: Gus Height, MD;  Location: [facility];  Service: Gynecology;  Laterality: N/A;   Family History  Problem Relation Age of Onset  . Diabetes Mother   . Deep vein thrombosis Mother        Varicose Veins  . Cancer Father        Father died from brain tumor.  [name] Heart disease Father   . Colon cancer Neg Hx   .  Stomach cancer Neg Hx    Social History   Socioeconomic History  . Marital status: Married    Spouse name: Not on file  . Number of children: Not on file  . Years of education: Not on file  . Highest education level: Not on file  Occupational History  . Not on file  Tobacco Use  . Smoking status: Current Every Day Smoker    Packs/day: 0.30    Years: 15.00    Pack years: 4.50    Types: Cigarettes  . Smokeless tobacco: Never Used  . Tobacco comment: Occassional smoker  Substance and Sexual Activity  . Alcohol use: Yes    Comment: Rarely  . Drug use: No  . Sexual activity: Yes    Birth control/protection: Post-menopausal  Other Topics Concern  . Not on file  Social History Narrative  . Not on file   Social Determinants of Health   Financial Resource Strain:   . Difficulty of Paying Living Expenses:   Food Insecurity:   . Worried About Charity fundraiser in the Last Year:   . Arboriculturist in the Last Year:   Transportation Needs:   . Film/video editor (Medical):   [name] Lack of Transportation (Non-Medical):   Physical Activity:   . Days of Exercise per Week:   . Minutes of Exercise per Session:   Stress:   . Feeling of Stress :   Social Connections:   . Frequency of Communication with Friends and Family:   . Frequency of Social Gatherings with Friends and Family:   . Attends Religious Services:   . Active Member of Clubs or Organizations:   . Attends Archivist Meetings:   [name] Marital Status:     Tobacco Counseling Ready to quit: Not Answered Counseling given: No Comment: Occassional smoker   Clinical Intake:  Pre-visit preparation completed: Yes  Pain : No/denies pain Pain Score: 0-No pain     Nutritional Risks: None Diabetes: No  How often do you need to have someone help you when you read instructions, pamphlets, or other written materials from your doctor or pharmacy?: 1 - Never What is the last grade level you completed in  school?: 14 years  Diabetic? no  Interpreter Needed?: No  Information entered by :: Lisette Abu, LPN   Activities of Daily Living In your present state of health, do you have any difficulty performing the following activities: 08/16/2019  Hearing? N  Vision? N  Difficulty concentrating or making decisions? N  Walking or climbing stairs? N  Dressing or bathing? N  Doing errands, shopping? N  Preparing Food and eating ? N  Using the Toilet? N  In the past six months, have you accidently leaked urine? N  Do you have problems with loss of bowel control? N  Managing your Medications? N  Managing your Finances? N  Housekeeping or managing your Housekeeping? N  Some recent data might be hidden    Patient Care Team: Hoyt Koch, MD as PCP - General (Internal Medicine)  Indicate any recent Medical Services you may have received from other than [facility] providers in the past year (date may be approximate).     Assessment:   This is a routine wellness examination for [facility].  Hearing/Vision screen No exam data present  Dietary issues and exercise activities discussed: Current Exercise Habits: Home exercise routine, Type of exercise: walking (swimming, playing tennis, walk dog), Time (Minutes): 30, Frequency (Times/Week): 5, Weekly Exercise (Minutes/Week): 150, Intensity: Moderate, Exercise limited by: None identified  Goals   None    Depression Screen PHQ 2/9 Scores 08/16/2019 06/25/2019 08/14/2018 01/09/2018 12/06/2016 08/12/2015 08/29/2014  PHQ - 2 Score 2 2 0 0 0 0 0  PHQ- 9 Score - 3 - - - - -    Fall Risk Fall Risk  08/16/2019 06/25/2019 06/24/2017 08/12/2015  Falls in the past year? 0 0 No No  Number falls in past yr: 0 0 - -  Injury with Fall? 0 0 - -  Risk for fall due to : No Fall Risks - - -  Follow up Falls evaluation completed;Education provided - - -    Any stairs in or around the home? Yes  If so, are there any without handrails? No  Home free of loose  throw rugs in walkways, pet beds, electrical cords, etc? Yes  Adequate lighting in your home to reduce risk of falls? Yes   ASSISTIVE DEVICES UTILIZED TO PREVENT FALLS:  Life alert? No  Use of a cane, walker or w/c? No  Grab bars in the bathroom? No  Shower chair or bench in shower? No  Elevated toilet seat or a handicapped toilet? No   TIMED UP AND GO:  Was the test performed? No .  Length of time to ambulate 10 feet: 0 sec.   Gait steady and fast without use of assistive device  Cognitive Function: MMSE - Mini Mental State Exam 08/16/2019  Not completed: Refused   Montreal Cognitive Assessment  07/26/2019  Visuospatial/ Executive (0/5) 5  Naming (0/3) 3  Attention: Read list of digits (0/2) 2  Attention: Read list of letters (0/1) 1  Attention: Serial 7 subtraction starting at 100 (0/3) 3  Language: Repeat phrase (0/2) 2  Language : Fluency (0/1) 1  Abstraction (0/2) 1  Delayed Recall (0/5) 5  Orientation (0/6) 6  Total 29      Immunizations Immunization History  Administered Date(s) Administered  . Hepatitis A, Adult 01/27/2015  . Influenza Split 04/01/2012  . Influenza,inj,Quad PF,6+ Mos 12/08/2013, 12/20/2017  . Pneumococcal Conjugate-13 12/08/2013  . Pneumococcal Polysaccharide-23 05/26/2017  . Td 05/19/2005  . Tdap 12/28/2015  . Zoster 07/13/2014  . Zoster Recombinat (Shingrix) 09/08/2017, 12/24/2017, 01/12/2018    TDAP status: Up to date Flu Vaccine status: Up to date Pneumococcal vaccine status: Up to date Covid-19 vaccine status: Completed vaccines  Qualifies for Shingles Vaccine? Yes   Zostavax completed Yes   Shingrix Completed?: Yes  Screening Tests Health Maintenance  Topic Date Due  . COVID-19 Vaccine (1) Never done  . COLONOSCOPY  Never done  . INFLUENZA VACCINE  09/19/2019  . MAMMOGRAM  08/03/2021  . TETANUS/TDAP  12/27/2025  . DEXA SCAN  Completed  . Hepatitis C Screening  Completed  . PNA vac Low Risk Adult  Completed    Health  Maintenance  Health Maintenance Due  Topic Date Due  . COVID-19 Vaccine (1) Never done  . COLONOSCOPY  Never done    Colorectal cancer screening: Completed (never done). Repeat every 0 years Mammogram status: Completed 08/04/2019. Repeat every year Bone Density status: Completed 06/03/2017. Results reflect: Bone density results: NORMAL. Repeat every 3 years.  Lung Cancer Screening: (Low Dose CT Chest recommended if Age 62-80 years, 30 pack-year currently smoking OR have quit w/in 15years.) does qualify.   Lung Cancer Screening Referral: no  Additional Screening:  Hepatitis C Screening: does qualify; Completed yes  Vision Screening: Recommended annual ophthalmology exams for early detection of glaucoma and other disorders of the eye. Is the patient up to date with their annual eye exam?  Yes  Who is the provider or what is the name of the office in which the patient attends annual eye exams? [facility] If pt is not established with a provider, would they like to be referred to a provider to establish care? No .   Dental Screening: Recommended annual dental exams for proper oral hygiene  Community Resource Referral / Chronic Care Management: CRR required this visit?  No   CCM required this visit?  No      Plan:     I have personally reviewed and noted the following in the patient's chart:   . Medical and social history . Use of alcohol, tobacco or illicit drugs  . Current medications and supplements . Functional ability and status . Nutritional status . Physical activity . Advanced directives . List of other physicians . Hospitalizations, surgeries, and ER visits in previous 12 months . Vitals . Screenings to include cognitive, depression, and falls . Referrals and appointments  In addition, I have reviewed and discussed with patient certain preventive protocols, quality metrics, and best practice recommendations. A written personalized care plan for preventive  services as well as general preventive health recommendations were provided to patient.     Sheral Flow, LPN   [id number]   Nurse Notes:  Cognitive testing not performed during visit.  Patient stated that she underwent neurological testing and a MRI to rule out Alzheimer's disease/memory issues; patient stated results were normal.

## 2019-08-16 NOTE — Patient Instructions (Signed)
Theresa Perez , Thank you for taking time to come for your Medicare Wellness Visit. I appreciate your ongoing commitment to your health goals. Please review the following plan we discussed and let me know if I can assist you in the future.   Screening recommendations/referrals: Colonoscopy: never done; will speak with primary care Mammogram: 08/04/2019 Bone Density: 06/03/2017 Recommended yearly ophthalmology/optometry visit for glaucoma screening and checkup Recommended yearly dental visit for hygiene and checkup  Vaccinations: Influenza vaccine: 12/20/2017 Pneumococcal vaccine: completed Tdap vaccine: 12/28/2015 Shingles vaccine: completed   Covid-19: completed   Advanced directives: Advance directive discussed with you today. I have provided a copy for you to complete at home and have notarized. Once this is complete please bring a copy in to our office so we can scan it into your chart.  Conditions/risks identified: Please continue to do your personal lifestyle choices by: daily care of teeth and gums, regular physical activity (goal should be 5 days a week for 30 minutes), eat a healthy diet, avoid tobacco and drug use, limiting any alcohol intake, taking a low-dose aspirin (if not allergic or have been advised by your provider otherwise) and taking vitamins and minerals as recommended by your provider. Continue doing brain stimulating activities (puzzles, reading, adult coloring books, staying active) to keep memory sharp. Continue to eat heart healthy diet (full of fruits, vegetables, whole grains, lean protein, water--limit salt, fat, and sugar intake) and increase physical activity as tolerated.  Next appointment: Please schedule your next Medicare Wellness Visit with your Nurse Health Advisor in 1 year.  Preventive Care 29 Years and Older, Female Preventive care refers to lifestyle choices and visits with your health care provider that can promote health and wellness. What does  preventive care include?  A yearly physical exam. This is also called an annual well check.  Dental exams once or twice a year.  Routine eye exams. Ask your health care provider how often you should have your eyes checked.  Personal lifestyle choices, including:  Daily care of your teeth and gums.  Regular physical activity.  Eating a healthy diet.  Avoiding tobacco and drug use.  Limiting alcohol use.  Practicing safe sex.  Taking low-dose aspirin every day.  Taking vitamin and mineral supplements as recommended by your health care provider. What happens during an annual well check? The services and screenings done by your health care provider during your annual well check will depend on your age, overall health, lifestyle risk factors, and family history of disease. Counseling  Your health care provider may ask you questions about your:  Alcohol use.  Tobacco use.  Drug use.  Emotional well-being.  Home and relationship well-being.  Sexual activity.  Eating habits.  History of falls.  Memory and ability to understand (cognition).  Work and work Statistician.  Reproductive health. Screening  You may have the following tests or measurements:  Height, weight, and BMI.  Blood pressure.  Lipid and cholesterol levels. These may be checked every 5 years, or more frequently if you are over 67 years old.  Skin check.  Lung cancer screening. You may have this screening every year starting at age 77 if you have a 30-pack-year history of smoking and currently smoke or have quit within the past 15 years.  Fecal occult blood test (FOBT) of the stool. You may have this test every year starting at age 36.  Flexible sigmoidoscopy or colonoscopy. You may have a sigmoidoscopy every 5 years or a colonoscopy every 10  years starting at age 78.  Hepatitis C blood test.  Hepatitis B blood test.  Sexually transmitted disease (STD) testing.  Diabetes screening. This  is done by checking your blood sugar (glucose) after you have not eaten for a while (fasting). You may have this done every 1-3 years.  Bone density scan. This is done to screen for osteoporosis. You may have this done starting at age 27.  Mammogram. This may be done every 1-2 years. Talk to your health care provider about how often you should have regular mammograms. Talk with your health care provider about your test results, treatment options, and if necessary, the need for more tests. Vaccines  Your health care provider may recommend certain vaccines, such as:  Influenza vaccine. This is recommended every year.  Tetanus, diphtheria, and acellular pertussis (Tdap, Td) vaccine. You may need a Td booster every 10 years.  Zoster vaccine. You may need this after age 25.  Pneumococcal 13-valent conjugate (PCV13) vaccine. One dose is recommended after age 56.  Pneumococcal polysaccharide (PPSV23) vaccine. One dose is recommended after age 73. Talk to your health care provider about which screenings and vaccines you need and how often you need them. This information is not intended to replace advice given to you by your health care provider. Make sure you discuss any questions you have with your health care provider. Document Released: 03/03/2015 Document Revised: 10/25/2015 Document Reviewed: 12/06/2014 Elsevier Interactive Patient Education  2017 [place] Prevention in the Home Falls can cause injuries. They can happen to people of all ages. There are many things you can do to make your home safe and to help prevent falls. What can I do on the outside of my home?  Regularly fix the edges of walkways and driveways and fix any cracks.  Remove anything that might make you trip as you walk through a door, such as a raised step or threshold.  Trim any bushes or trees on the path to your home.  Use bright outdoor lighting.  Clear any walking paths of anything that might make  someone trip, such as rocks or tools.  Regularly check to see if handrails are loose or broken. Make sure that both sides of any steps have handrails.  Any raised decks and porches should have guardrails on the edges.  Have any leaves, snow, or ice cleared regularly.  Use sand or salt on walking paths during winter.  Clean up any spills in your garage right away. This includes oil or grease spills. What can I do in the bathroom?  Use night lights.  Install grab bars by the toilet and in the tub and shower. Do not use towel bars as grab bars.  Use non-skid mats or decals in the tub or shower.  If you need to sit down in the shower, use a plastic, non-slip stool.  Keep the floor dry. Clean up any water that spills on the floor as soon as it happens.  Remove soap buildup in the tub or shower regularly.  Attach bath mats securely with double-sided non-slip rug tape.  Do not have throw rugs and other things on the floor that can make you trip. What can I do in the bedroom?  Use night lights.  Make sure that you have a light by your bed that is easy to reach.  Do not use any sheets or blankets that are too big for your bed. They should not hang down onto the floor.  Have  a firm chair that has side arms. You can use this for support while you get dressed.  Do not have throw rugs and other things on the floor that can make you trip. What can I do in the kitchen?  Clean up any spills right away.  Avoid walking on wet floors.  Keep items that you use a lot in easy-to-reach places.  If you need to reach something above you, use a strong step stool that has a grab bar.  Keep electrical cords out of the way.  Do not use floor polish or wax that makes floors slippery. If you must use wax, use non-skid floor wax.  Do not have throw rugs and other things on the floor that can make you trip. What can I do with my stairs?  Do not leave any items on the stairs.  Make sure that  there are handrails on both sides of the stairs and use them. Fix handrails that are broken or loose. Make sure that handrails are as long as the stairways.  Check any carpeting to make sure that it is firmly attached to the stairs. Fix any carpet that is loose or worn.  Avoid having throw rugs at the top or bottom of the stairs. If you do have throw rugs, attach them to the floor with carpet tape.  Make sure that you have a light switch at the top of the stairs and the bottom of the stairs. If you do not have them, ask someone to add them for you. What else can I do to help prevent falls?  Wear shoes that:  Do not have high heels.  Have rubber bottoms.  Are comfortable and fit you well.  Are closed at the toe. Do not wear sandals.  If you use a stepladder:  Make sure that it is fully opened. Do not climb a closed stepladder.  Make sure that both sides of the stepladder are locked into place.  Ask someone to hold it for you, if possible.  Clearly mark and make sure that you can see:  Any grab bars or handrails.  First and last steps.  Where the edge of each step is.  Use tools that help you move around (mobility aids) if they are needed. These include:  Canes.  Walkers.  Scooters.  Crutches.  Turn on the lights when you go into a dark area. Replace any light bulbs as soon as they burn out.  Set up your furniture so you have a clear path. Avoid moving your furniture around.  If any of your floors are uneven, fix them.  If there are any pets around you, be aware of where they are.  Review your medicines with your doctor. Some medicines can make you feel dizzy. This can increase your chance of falling. Ask your doctor what other things that you can do to help prevent falls. This information is not intended to replace advice given to you by your health care provider. Make sure you discuss any questions you have with your health care provider. Document Released:  12/01/2008 Document Revised: 07/13/2015 Document Reviewed: 03/11/2014 Elsevier Interactive Patient Education  2017 [employer].

## 2019-08-16 NOTE — Progress Notes (Signed)
   Subjective:   Patient ID: Theresa Perez, female    DOB: 1950-10-21, 69 y.o.   MRN: [id number]  HPI The patient is a 69 YO female coming in for physical.  PMH, [place], social history reviewed and updated  Review of Systems  Constitutional: Negative.   HENT: Negative.   Eyes: Negative.   Respiratory: Negative for cough, chest tightness and shortness of breath.   Cardiovascular: Negative for chest pain, palpitations and leg swelling.  Gastrointestinal: Negative for abdominal distention, abdominal pain, constipation, diarrhea, nausea and vomiting.  Musculoskeletal: Negative.   Skin: Negative.   Neurological: Negative.   Psychiatric/Behavioral: Negative.     Objective:  Physical Exam Constitutional:      Appearance: She is well-developed.  HENT:     Head: Normocephalic and atraumatic.  Cardiovascular:     Rate and Rhythm: Normal rate and regular rhythm.  Pulmonary:     Effort: Pulmonary effort is normal. No respiratory distress.     Breath sounds: Normal breath sounds. No wheezing or rales.  Abdominal:     General: Bowel sounds are normal. There is no distension.     Palpations: Abdomen is soft.     Tenderness: There is no abdominal tenderness. There is no rebound.  Musculoskeletal:     Cervical back: Normal range of motion.  Skin:    General: Skin is warm and dry.  Neurological:     Mental Status: She is alert and oriented to person, place, and time.     Coordination: Coordination normal.     Vitals:   08/16/19 1059  BP: 116/70  Pulse: 70  Temp: 98 F (36.7 C)  TempSrc: Oral  SpO2: 94%  Weight: 171 lb (77.6 kg)  Height: 5\' 11"  (1.803 m)    This visit occurred during the SARS-CoV-2 public health emergency.  Safety protocols were in place, including screening questions prior to the visit, additional usage of staff PPE, and extensive cleaning of exam room while observing appropriate contact time as indicated for disinfecting solutions.   Assessment & Plan:

## 2019-08-16 NOTE — Assessment & Plan Note (Signed)
Worse recently and she is pursuing counseling but cannot get in until August.

## 2019-08-17 ENCOUNTER — Other Ambulatory Visit: Payer: Self-pay | Admitting: Internal Medicine

## 2019-08-17 DIAGNOSIS — I10 Essential (primary) hypertension: Secondary | ICD-10-CM

## 2019-08-19 ENCOUNTER — Ambulatory Visit: Payer: Medicare PPO | Admitting: Internal Medicine

## 2019-09-06 ENCOUNTER — Other Ambulatory Visit: Payer: Self-pay | Admitting: Internal Medicine

## 2019-09-06 DIAGNOSIS — E890 Postprocedural hypothyroidism: Secondary | ICD-10-CM

## 2019-09-13 ENCOUNTER — Other Ambulatory Visit: Payer: Self-pay | Admitting: Internal Medicine

## 2019-09-13 DIAGNOSIS — J069 Acute upper respiratory infection, unspecified: Secondary | ICD-10-CM

## 2019-09-16 ENCOUNTER — Ambulatory Visit: Payer: Medicare PPO | Admitting: Internal Medicine

## 2019-09-22 ENCOUNTER — Ambulatory Visit (INDEPENDENT_AMBULATORY_CARE_PROVIDER_SITE_OTHER): Payer: Medicare PPO | Admitting: Psychiatry

## 2019-09-22 ENCOUNTER — Other Ambulatory Visit: Payer: Self-pay

## 2019-09-22 DIAGNOSIS — F411 Generalized anxiety disorder: Secondary | ICD-10-CM

## 2019-09-22 NOTE — Progress Notes (Signed)
[facility] Counselor Initial Adult Exam  Name: Theresa Perez Date: 02/24/5100 MRN: [id number] DOB: 1950/08/18 PCP: Hoyt Koch, MD  Time spent: 60 minutes 8:00am to 9:00am  Guardian/Payee:  patient  Paperwork requested:  No   Reason for Visit /Presenting Problem: anxiety, depression  Mental Status Exam:   Appearance:   Casual     Behavior:  Appropriate, Sharing and Motivated  Motor:  Normal  Speech/Language:   Clear and Coherent  Affect:  anxious, depressed  Mood:  anxious and depressed  Thought process:  rambles and hard to stay on topic, some nervousness today for first session  Thought content:    some rumination and obsessiveness  Sensory/Perceptual disturbances:    WNL  Orientation:  oriented to person, place, time/date, situation, day of week, month of year and year  Attention:  Good  Concentration:  Good  Memory:  WNL  Fund of knowledge:   Good  Insight:    Good  Judgment:   Good  Impulse Control:  Good   Reported Symptoms:  See symptoms above  Risk Assessment: Danger to Self:  No Self-injurious Behavior: No Danger to Others: No Duty to Warn:no Physical Aggression / Violence:No  Access to Firearms a concern: No  Gang Involvement:No  Patient / guardian was educated about steps to take if suicide or homicide risk level increases between visits: Patient denies any SI or HI. While future psychiatric events cannot be accurately predicted, the patient does not currently require acute inpatient psychiatric care and does not currently meet [facility] involuntary commitment criteria.  Substance Abuse History: Current substance abuse: No     Past Psychiatric History:   Previous psychological history is significant for anxiety and depression Outpatient Providers: through [facility] History of Psych Hospitalization: 1 time for 3 days for depression Psychological Testing: none   Abuse History: Victim of No., none   Report needed: No. Victim of  Neglect:No. Perpetrator of n/a  Witness / Exposure to Domestic Violence: No   Protective Services Involvement: No  Witness to [employer] Violence:  No   Family History:  Family History  Problem Relation Age of Onset  . Diabetes Mother   . Deep vein thrombosis Mother        Varicose Veins  . Cancer Father        Father died from brain tumor.  [name] Heart disease Father   . Colon cancer Neg Hx   . Stomach cancer Neg Hx     Living situation: the patient lives with their spouse  Sexual Orientation:  Straight  Relationship Status: married for 43 yrs. 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 History of legal issue / charges:  n/a  Medical History/Surgical History: Reviewed with patient and she confirms info below. Past Medical History:  Diagnosis Date  . Allergy   . Anxiety   . 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 Complication of anesthesia   . Cyst of breast, right, diffuse fibrocystic   . Hypertension   . Hypothyroidism   . PONV (postoperative nausea and vomiting)     Past Surgical History:  Procedure Laterality Date  . ANTERIOR AND POSTERIOR  REPAIR N/A 03/31/2012   Procedure: ANTERIOR (CYSTOCELE) AND POSTERIOR REPAIR (RECTOCELE);  Surgeon: Gus Height, MD;  Location: [facility];  Service: Gynecology;  Laterality: N/A;  . aspiration cyst right breast    . THYROIDECTOMY  1997  . TONSILLECTOMY  1969  . uterine ablation    . VAGINAL HYSTERECTOMY N/A 03/31/2012   Procedure: HYSTERECTOMY VAGINAL;  Surgeon: Gus Height, MD;  Location: [facility];  Service: Gynecology;  Laterality: N/A;    Medications: Current Outpatient Medications  Medication Sig Dispense Refill  . Ascorbic Acid (VITAMIN C PO) Take 1 tablet by mouth daily.    [name] atenolol-chlorthalidone (TENORETIC) 50-25 MG tablet Take 1/2 (one-half) tablet by mouth once daily 45 tablet 2  . Black Cohosh 540 MG CAPS Take 540 mg by mouth 2 (two) times daily.     . butalbital-acetaminophen-caffeine (ESGIC) 50-325-40 MG tablet Take 1 tablet by mouth 2 (two) times daily as needed for headache. 30 tablet 1  . CALCIUM PO Take 1 tablet by mouth daily.    [name] COENZYME Q-10 PO Take 1 tablet by mouth daily.    . Cyanocobalamin (VITAMIN B-12 PO) Take 1 tablet by mouth daily.    . DULoxetine (CYMBALTA) 60 MG capsule Take 1 capsule by mouth twice daily 180 capsule 0  . ECHINACEA EXTRACT PO Take by mouth.    . EUTHYROX 100 MCG tablet Take 1 tablet by mouth once daily 30 tablet 11  . EVENING PRIMROSE OIL PO Take by mouth.    . ferrous sulfate 325 (65 FE) MG tablet Take 325 mg by mouth every evening.     . Flaxseed, Linseed, (FLAX SEED OIL PO) Take by mouth.    . fluticasone (FLONASE) 50 MCG/ACT nasal spray Use 2 spray(s) in each nostril once daily 48 g 2  . Ginkgo Biloba (GNP GINGKO BILOBA EXTRACT PO) Take 1 tablet by mouth daily.    [name] LORazepam (ATIVAN) 0.5 MG tablet TAKE 1/2 (ONE-HALF) TABLET BY MOUTH ONCE DAILY AS NEEDED FOR ANXIETY 20 tablet 2  . Misc Natural Products (GLUCOSAMINE CHOND COMPLEX/MSM) TABS Take 1 tablet by mouth 2 (two) times daily.    . Multiple Vitamins-Minerals (MULTIVITAMIN WITH  MINERALS) tablet Take 1 tablet by mouth daily.    . Nutritional Supplements (DHEA PO) Take 1 tablet by mouth daily.    . Turmeric (QC TUMERIC COMPLEX PO) Take by mouth.     No current facility-administered medications for this visit.    Allergies  Allergen Reactions  . Permethrin Rash    Diagnoses:    ICD-10-CM   1. Generalized anxiety disorder  F41.1     Plan of Care:  Patient not signing treatment plan on computer screen due to Covid.  Treatment Goals: Goals remain on treatment plan as patient works on strategies to achieve her goals.  Progress will be noted each week in "Progress" section of treatment plan.  Long term goal: Reduce overall level, frequency, and intensity of the anxiety so that daily functioning is not impaired.  Short term  goal: Identify, challenge, and replace anxious/fearful/depressive thoughts and self-talk with more positive, realistic, and empowering thoughts and self-talk that do not support anxiety nor depression.  Strategy: Review, repeat, and reinforce success.  Progress: Patient is a 69 yr old mariied female, married 45 years.  States husband is not very supportive and she doesn't feel he is as caring as what she needs in supporting her. Has 1 adult son who is 62 yrs old an lives in [place], [place]. Has 1 grand-daughter and 2 great grand-daughters. Patient is retired from being a Systems developer for 28 years. Wants to change husband and struggles with that often which doesn't help their relationship.  Mentions wanting to change him several times in our talking today. Per patient, she would like to change and stick to her goals, and change her negative thoughts of herself, count our blessings, and be less anxious and less depressed overall. Reviewed issues and goals with patient and she is to return within approx. 3 weeks.  Next appt within 3 weeks.   Shanon Ace, LCSW

## 2019-09-28 ENCOUNTER — Ambulatory Visit: Payer: Medicare PPO | Admitting: Psychology

## 2019-10-05 ENCOUNTER — Ambulatory Visit: Payer: Medicare PPO | Admitting: Psychology

## 2019-10-12 ENCOUNTER — Ambulatory Visit: Payer: Medicare PPO | Admitting: Psychology

## 2019-10-13 ENCOUNTER — Other Ambulatory Visit: Payer: Self-pay

## 2019-10-13 ENCOUNTER — Ambulatory Visit (INDEPENDENT_AMBULATORY_CARE_PROVIDER_SITE_OTHER): Payer: Medicare PPO | Admitting: Psychiatry

## 2019-10-13 DIAGNOSIS — F411 Generalized anxiety disorder: Secondary | ICD-10-CM | POA: Diagnosis not present

## 2019-10-13 NOTE — Progress Notes (Signed)
[facility] Counselor/Therapist Progress Note  Patient ID: Theresa Perez, MRN: [id number],    Date: 10/13/2019  Time Spent: 60 minutes  11:00am to 12:00noon  Treatment Type: Individual Therapy  Reported Symptoms: anxiety, "saddened when I think about how I want things to be and how things really are.", some depression  Mental Status Exam:  Appearance:   Neat     Behavior:  Appropriate, Sharing and Motivated  Motor:  Normal  Speech/Language:   Clear and Coherent  Affect:  anxiety, some depression  Mood:  anxious and some depression  Thought process:  normal  Thought content:    WNL  Sensory/Perceptual disturbances:    WNL  Orientation:  oriented to person, place, time/date, situation, day of week, month of year and year  Attention:  Good  Concentration:  Good  Memory:  WNL  Fund of knowledge:   Good  Insight:    Good  Judgment:   Good  Impulse Control:  Good   Risk Assessment: Danger to Self:  No Self-injurious Behavior: No Danger to Others: No Duty to Warn:no Physical Aggression / Violence:No  Access to Firearms a concern: No  Gang Involvement:No   Subjective: Patient today reports some increased sense of hope and has stayed busier and "not moped around" since intial appt.  Interventions: Solution-Oriented/Positive Psychology and Ego-Supportive  Diagnosis:   ICD-10-CM   1. Generalized anxiety disorder  F41.1      Plan of Care:  Patient not signing treatment plan on computer screen due to Covid.  Treatment Goals: Goals remain on treatment plan as patient works on strategies to achieve her goals.  Progress will be noted each week in "Progress" section of treatment plan.  Long term goal: Reduce overall level, frequency, and intensity of the anxiety so that daily functioning is not impaired.  Short term goal: Identify, challenge, and replace anxious/fearful/depressive thoughts and self-talk with more positive, realistic, and empowering thoughts  and self-talk that do not support anxiety nor depression.  Strategy: Review, repeat, and reinforce success.  Progress: Patient in today reporting anxiety and some "sadness in wanting things in life to be a certain way but they are very different from what she wants."  Husband has depression issues and on "same meds" but with no benefit in a long time. Did work on communicating with husband in more positive/encouraging ways which is a challenge and she was successful on at least a couple occasions, and able to process today some of her difficulties in following through. States she is bowling some and playing tennis some and may get involved in pickelball at her church, all of which would be good for her emotionally and physically. Patient brought in some written homework of her "priorities" in making changes and we discussed these in more detail today.  She is to continue with some recommended communication skill changes in order to be more positive in her speaking rather than using a  Lot of "you should's" or "you ought to", etc. Adjusting some to the fact "I can't change my husband," but looking at better ways of encouraging him and expressing appreciation as appropriate. Working to lessen the negative thoughts of herself, and working on better skillset to manage anxiety and some depression. Wants to follow up on a "gratitude list."  Goal review and progress/challenges noted with patient  Next appt within 3 weeks.   Shanon Ace, LCSW

## 2019-11-03 ENCOUNTER — Other Ambulatory Visit: Payer: Medicare PPO

## 2019-11-03 ENCOUNTER — Other Ambulatory Visit: Payer: Self-pay

## 2019-11-03 DIAGNOSIS — Z20822 Contact with and (suspected) exposure to covid-19: Secondary | ICD-10-CM | POA: Diagnosis not present

## 2019-11-05 ENCOUNTER — Encounter: Payer: Self-pay | Admitting: Internal Medicine

## 2019-11-06 LAB — NOVEL CORONAVIRUS, NAA: SARS-CoV-2, NAA: NOT DETECTED

## 2019-11-10 ENCOUNTER — Ambulatory Visit: Payer: Medicare PPO | Admitting: Psychiatry

## 2019-11-10 DIAGNOSIS — Z20822 Contact with and (suspected) exposure to covid-19: Secondary | ICD-10-CM | POA: Diagnosis not present

## 2019-11-13 ENCOUNTER — Other Ambulatory Visit: Payer: Self-pay | Admitting: Internal Medicine

## 2019-11-13 DIAGNOSIS — F4322 Adjustment disorder with anxiety: Secondary | ICD-10-CM

## 2019-12-01 ENCOUNTER — Ambulatory Visit (INDEPENDENT_AMBULATORY_CARE_PROVIDER_SITE_OTHER): Payer: Medicare PPO | Admitting: Psychiatry

## 2019-12-01 ENCOUNTER — Other Ambulatory Visit: Payer: Self-pay

## 2019-12-01 DIAGNOSIS — F411 Generalized anxiety disorder: Secondary | ICD-10-CM

## 2019-12-01 NOTE — Progress Notes (Signed)
[facility] Counselor/Therapist Progress Note  Patient ID: Theresa Perez, MRN: [id number],    Date: 12/01/2019  Time Spent: 60 minutes  3:00pm to 4:00pm  Treatment Type: Individual Therapy  Reported Symptoms: anxiety, frustration, sadness   Mental Status Exam:  Appearance:   Casual     Behavior:  Appropriate, Sharing and Motivated  Motor:  Normal  Speech/Language:   Clear and Coherent  Affect:  anxiety  Mood:  anxious  Thought process:  normal  Thought content:    WNL  Sensory/Perceptual disturbances:    WNL  Orientation:  oriented to person, place, time/date, situation, day of week, month of year and year  Attention:  Good  Concentration:  Good  Memory:  WNL  Fund of knowledge:   Good  Insight:    Good  Judgment:   Good  Impulse Control:  Good and Fair   Risk Assessment: Danger to Self:  No Self-injurious Behavior: No Danger to Others: No Duty to Warn:no Physical Aggression / Violence:No  Access to Firearms a concern: No  Gang Involvement:No   Subjective:  Patient reports today that she is getting some better and staying busy helps. Still bothered with issues [id number]. Reports sad sometimes, and anxiety, and frustration.   Interventions: Cognitive Behavioral Therapy and Solution-Oriented/Positive Psychology  Diagnosis:   ICD-10-CM   1. Generalized anxiety disorder  F41.1      Plan of Care: Patient not signing treatment plan on computer screen due to Covid.  Treatment Goals: Goals remain on treatment plan as patient works on strategies to achieve her goals. Progress will be noted each week in "Progress" section of treatment plan.  Long term goal: Reduce overall level, frequency, and intensity of the anxiety so that daily functioning is not impaired.  Short term goal: Identify, challenge, and replace anxious/fearful/depressive thoughts and self-talk with more positive, realistic, and empowering thoughts and self-talk that do not support  anxiety nor depression.  Strategy: Review, repeat, and reinforce success.  Progress:  Patient in today with anxiety an frustration, some with husband. Wants to change husband and has difficulty accepting she can't. Some sadness and frustration.  Feels she is getting some better and "staying busy helps."  Talking almost non-stop and a lot about husband and her frustrations with him. States" I have really good days when I go to church a lot and enjoys their [employer].   Anxiety not much better, "a general anxiety". Getting up in the mornings is my "worst time", feeling more anxious. States her medication helps some with her anxiety.States that she goes to the past a lot instead of staying in the present and "that makes me anxious." "Life is just very different from what I wanted." Discussed this more and ways of coping effectively with disappointments and uncertainties. States Covid has made her feelings worse.  Involved in tennis, pickelball, church, bowling, choir. Husband involved in bowling with her but doesn't really enjoy it. Encouraged positive self-care for patient including looking for more positives and negatives, focusing on what she can change her impact versus cannot, letting go of "shoulds", accepting she cannot change her husband, decreasing the negative thoughts about herself, practice trying to manage her anxiety and some depression better through some of the strategies we have talked about in session, continuing her physical activity of bowling and pickle ball, continue her activities at church that she enjoys and letting her faith be a source of emotional support to her.  She is also to follow-up  on a gratitude list which she forgot to do this past time but plans to do it before next session.  Goal review and progress/challenges noted with patient.  Next appointment within 3 weeks.    Shanon Ace, LCSW

## 2019-12-21 DIAGNOSIS — L600 Ingrowing nail: Secondary | ICD-10-CM | POA: Diagnosis not present

## 2019-12-21 DIAGNOSIS — M25774 Osteophyte, right foot: Secondary | ICD-10-CM | POA: Diagnosis not present

## 2019-12-22 ENCOUNTER — Ambulatory Visit (INDEPENDENT_AMBULATORY_CARE_PROVIDER_SITE_OTHER): Payer: Medicare PPO | Admitting: Psychiatry

## 2019-12-22 ENCOUNTER — Other Ambulatory Visit: Payer: Self-pay

## 2019-12-22 DIAGNOSIS — F411 Generalized anxiety disorder: Secondary | ICD-10-CM

## 2019-12-22 NOTE — Progress Notes (Addendum)
[facility] Counselor/Therapist Progress Note  Patient ID: REIS PIENTA, MRN: [id number],    Date: 12/22/2019  Time Spent: 50 minutes   12:00noon to 12:50pm  Treatment Type: Individual Therapy  Reported Symptoms: anxiety    Mental Status Exam:  Appearance:   Casual     Behavior:  Appropriate, Sharing and Motivated  Motor:  Normal  Speech/Language:   Clear and Coherent  Affect:  anxious  Mood:  anxious  Thought process:  some tangentiality  Thought content:    some obsessiveness  Sensory/Perceptual disturbances:    WNL  Orientation:  oriented to person, place, time/date, situation, day of week, month of year and year  Attention:  Good  Concentration:  Fair  Memory:  WNL, later acknowledges "some forgetfulness"  Fund of knowledge:   Good  Insight:    Good and Fair  Judgment:   Good and Fair  Impulse Control:  Good   Risk Assessment: Danger to Self:  No Self-injurious Behavior: No Danger to Others: No Duty to Warn:no Physical Aggression / Violence:No  Access to Firearms a concern: No  Gang Involvement:No   Subjective: Patient today reports being encouraged more about the future after having better couple weeks together with husband.  Anxiety remains her main issue and is working more consistently on reducing her anxiety.  Interventions: Cognitive Behavioral Therapy and Solution-Oriented/Positive Psychology  Diagnosis:   ICD-10-CM   1. Generalized anxiety disorder  F41.1      Plan of Care: Patient not signing treatment plan on computer screen due to Covid.  Treatment Goals: Goals remain on treatment plan as patient works on strategies to achieve her goals. Progress will be noted each week in "Progress" section of treatment plan.  Long term goal: Reduce overall level, frequency, and intensity of the anxiety so that daily functioning is not impaired.  Short term goal: Identify, challenge, and replace anxious/fearful/depressive thoughts and  self-talk with more positive, realistic, and empowering thoughts and self-talk that do not support anxiety nor depression.  Strategy: Review, repeat, and reinforce success.  Progress:  Patient in today reporting some decrease in her anxiety.  Things some better with her husband which was encouraging. Some improvements in communication, and in participating in some activities together. Patient has difficulty staying on topic in session but responds to re-direction in order to share as much as possible during our session time.  Talks almost non-stop (not with escalated speed, but just constant talking). This likely happens at home also and may be part of communication issues with husband. Patient states her" next steps are to work more on her anxiety that is not as related to her husband and their relationship.  Working with her short term goal in [place]x Plan above, we worked some today on identifying/challenging/and replacing her anxious/fearful/depressive thoughts and self-talk that do not support anxiety nor depression. Also shares some activities outside, inside her home, that she hopes to accomplish and feels this would help her anxiety. Shares that taking notes on goal-related behaviors/activities is helpful to her and will continue this between sessions.  Encouraged patient to complete her gratitude list between sessions and also work on some communication tips with her husband including intentionally listening, staying in the present versus the past or future, focusing on what she can control versus not control, not automatically jumping to negative conclusions, letting go of "shoulds", continuing her good physical care that includes healthy nutrition and exercise, using her faith as a source of emotional support, and  1 very important point is to realize and accept she cannot change her husband.   Goal review and progress/challenges noted with patient.  Next appointment within 3 weeks.   Theresa Ace, LCSW

## 2020-01-07 ENCOUNTER — Other Ambulatory Visit: Payer: Self-pay

## 2020-01-07 ENCOUNTER — Encounter: Payer: Self-pay | Admitting: Family

## 2020-01-07 ENCOUNTER — Ambulatory Visit (INDEPENDENT_AMBULATORY_CARE_PROVIDER_SITE_OTHER): Payer: Medicare PPO | Admitting: Family

## 2020-01-07 VITALS — BP 136/80 | HR 80 | Temp 98.5°F | Ht 71.0 in | Wt 167.0 lb

## 2020-01-07 DIAGNOSIS — T148XXA Other injury of unspecified body region, initial encounter: Secondary | ICD-10-CM | POA: Diagnosis not present

## 2020-01-07 DIAGNOSIS — G44229 Chronic tension-type headache, not intractable: Secondary | ICD-10-CM | POA: Diagnosis not present

## 2020-01-07 MED ORDER — METHOCARBAMOL 500 MG PO TABS: 500.0000 mg | ORAL_TABLET | Freq: Three times a day (TID) | ORAL | 0 refills | Status: DC | PRN

## 2020-01-07 MED ORDER — BUTALBITAL-APAP-CAFFEINE 50-325-40 MG PO TABS: 1.0000 | ORAL_TABLET | Freq: Two times a day (BID) | ORAL | 0 refills | Status: DC | PRN

## 2020-01-07 MED ORDER — MELOXICAM 15 MG PO TABS
15.0000 mg | ORAL_TABLET | Freq: Every day | ORAL | 0 refills | Status: DC
Start: 2020-01-07 — End: 2020-02-08

## 2020-01-07 NOTE — Progress Notes (Signed)
Theresa Perez is a 69 y.o. female with the following history as recorded in EpicCare:  Patient Active Problem List   Diagnosis Date Noted  . Adjustment disorder with mixed anxiety and depressed mood 08/02/2019  . Memory change 06/25/2019  . Smokers' cough ([place]) 12/11/2017  . De Quervain's disease (radial styloid tenosynovitis) 04/01/2017  . Arthritis of left knee 02/22/2015  . Routine general medical examination at a health care facility 12/16/2014  . Constipation 07/13/2014  . Papillary thyroid carcinoma ([place]) 03/23/2014  . Pure hypercholesterolemia 12/08/2013  . Varicose veins of lower extremities with other complications 81/19/1478  . Essential hypertension 05/19/2007    Current Outpatient Medications  Medication Sig Dispense Refill  . Ascorbic Acid (VITAMIN C PO) Take 1 tablet by mouth daily.    [name] atenolol-chlorthalidone (TENORETIC) 50-25 MG tablet Take 1/2 (one-half) tablet by mouth once daily 45 tablet 2  . Black Cohosh 540 MG CAPS Take 540 mg by mouth 2 (two) times daily.     . butalbital-acetaminophen-caffeine (ESGIC) 50-325-40 MG tablet Take 1 tablet by mouth 2 (two) times daily as needed for headache. 30 tablet 0  . CALCIUM PO Take 1 tablet by mouth daily.    [name] COENZYME Q-10 PO Take 1 tablet by mouth daily.    . Cyanocobalamin (VITAMIN B-12 PO) Take 1 tablet by mouth daily.    . DULoxetine (CYMBALTA) 60 MG capsule Take 1 capsule by mouth twice daily 180 capsule 1  . ECHINACEA EXTRACT PO Take by mouth.    . EUTHYROX 100 MCG tablet Take 1 tablet by mouth once daily 30 tablet 11  . EVENING PRIMROSE OIL PO Take by mouth.    . ferrous sulfate 325 (65 FE) MG tablet Take 325 mg by mouth every evening.     . Flaxseed, Linseed, (FLAX SEED OIL PO) Take by mouth.    . fluticasone (FLONASE) 50 MCG/ACT nasal spray Use 2 spray(s) in each nostril once daily 48 g 2  . Ginkgo Biloba (GNP GINGKO BILOBA EXTRACT PO) Take 1 tablet by mouth daily.    [name] LORazepam (ATIVAN) 0.5 MG tablet TAKE  1/2 (ONE-HALF) TABLET BY MOUTH ONCE DAILY AS NEEDED FOR ANXIETY 20 tablet 2  . Misc Natural Products (GLUCOSAMINE CHOND COMPLEX/MSM) TABS Take 1 tablet by mouth 2 (two) times daily.    . Multiple Vitamins-Minerals (MULTIVITAMIN WITH MINERALS) tablet Take 1 tablet by mouth daily.    . Nutritional Supplements (DHEA PO) Take 1 tablet by mouth daily.    . Turmeric (QC TUMERIC COMPLEX PO) Take by mouth.    . meloxicam (MOBIC) 15 MG tablet Take 1 tablet (15 mg total) by mouth daily. 30 tablet 0  . methocarbamol (ROBAXIN) 500 MG tablet Take 1 tablet (500 mg total) by mouth every 8 (eight) hours as needed. 30 tablet 0   No current facility-administered medications for this visit.    Allergies: Permethrin  Past Medical History:  Diagnosis Date  . Allergy   . Anxiety   . Cancer ([place])    Thyroid-Papillary  . Complication of anesthesia   . Cyst of breast, right, diffuse fibrocystic   . Hypertension   . Hypothyroidism   . PONV (postoperative nausea and vomiting)     Past Surgical History:  Procedure Laterality Date  . ANTERIOR AND POSTERIOR REPAIR N/A 03/31/2012   Procedure: ANTERIOR (CYSTOCELE) AND POSTERIOR REPAIR (RECTOCELE);  Surgeon: Gus Height, MD;  Location: [facility];  Service: Gynecology;  Laterality: N/A;  . aspiration cyst right breast    .  THYROIDECTOMY  1997  . TONSILLECTOMY  1969  . uterine ablation    . VAGINAL HYSTERECTOMY N/A 03/31/2012   Procedure: HYSTERECTOMY VAGINAL;  Surgeon: Gus Height, MD;  Location: [facility];  Service: Gynecology;  Laterality: N/A;    Family History  Problem Relation Age of Onset  . Diabetes Mother   . Deep vein thrombosis Mother        Varicose Veins  . Cancer Father        Father died from brain tumor.  [name] Heart disease Father   . Colon cancer Neg Hx   . Stomach cancer Neg Hx     Social History   Tobacco Use  . Smoking status: Current Every Day Smoker    Packs/day: 0.30    Years: 15.00    Pack years: 4.50    Types: Cigarettes  . Smokeless  tobacco: Never Used  . Tobacco comment: Occassional smoker  Substance Use Topics  . Alcohol use: Yes    Comment: Rarely    Subjective:  Patient slipped while walking down the stairs yesterday- used her right arm to "keep herself from falling"- did not fall on the arm/ essentially hyperextended the arm; notes that pain today is better; has had some swelling over her upper chest; used Ibuprofen and ice with some benefit;   Also requesting refill on the Fioricet that she uses as needed for migraine headaches; last refill was actually in 2018;    Objective:  Vitals:   01/07/20 1427  BP: 136/80  Pulse: 80  Temp: 98.5 F (36.9 C)  TempSrc: Oral  SpO2: 97%  Weight: 167 lb (75.8 kg)  Height: 5\' 11"  (1.803 m)    General: Well developed, well nourished, in no acute distress  Skin : Warm and dry.  Head: Normocephalic and atraumatic  Eyes: Sclera and conjunctiva clear; pupils round and reactive to light; extraocular movements intact  Lungs: Respirations unlabored;  Musculoskeletal: No deformities; no active joint inflammation  Extremities: No edema, cyanosis, clubbing  Vessels: Symmetric bilaterally  Neurologic: Alert and oriented; speech intact; face symmetrical; moves all extremities well; CNII-XII intact without focal deficit   Assessment:  1. Muscle strain   2. Chronic tension-type headache, not intractable     Plan:  1. Reassurance; no concern for fracture at this time as patient did not actually fall on the arm; sounds more consistent with a hyperextension; encouraged to rest and apply ice; trial of Mobic and Robaxin; will plan to have her follow up with sports medicine if symptoms persist; 2. Refill given on Fioricet; PDMP was review;   This visit occurred during the SARS-CoV-2 public health emergency.  Safety protocols were in place, including screening questions prior to the visit, additional usage of staff PPE, and extensive cleaning of exam room while observing appropriate  contact time as indicated for disinfecting solutions.     No follow-ups on file.  No orders of the defined types were placed in this encounter.   Requested Prescriptions   Signed Prescriptions Disp Refills  . meloxicam (MOBIC) 15 MG tablet 30 tablet 0    Sig: Take 1 tablet (15 mg total) by mouth daily.  . methocarbamol (ROBAXIN) 500 MG tablet 30 tablet 0    Sig: Take 1 tablet (500 mg total) by mouth every 8 (eight) hours as needed.  . butalbital-acetaminophen-caffeine (ESGIC) 50-325-40 MG tablet 30 tablet 0    Sig: Take 1 tablet by mouth 2 (two) times daily as needed for headache.

## 2020-01-17 ENCOUNTER — Encounter: Payer: Self-pay | Admitting: Family

## 2020-01-17 ENCOUNTER — Ambulatory Visit (INDEPENDENT_AMBULATORY_CARE_PROVIDER_SITE_OTHER): Payer: Medicare PPO | Admitting: Psychiatry

## 2020-01-17 ENCOUNTER — Other Ambulatory Visit: Payer: Self-pay

## 2020-01-17 DIAGNOSIS — F411 Generalized anxiety disorder: Secondary | ICD-10-CM

## 2020-01-17 NOTE — Progress Notes (Signed)
[facility] Counselor/Therapist Progress Note  Patient ID: Theresa Perez, MRN: [id number],    Date: 01/17/2020  Time Spent: 60 minutes   8:00am to 9:00am  Treatment Type: Individual Therapy  Reported Symptoms: anxiety, frustration  Mental Status Exam:  Appearance:   Casual     Behavior:  Appropriate, Sharing and Motivated  Motor:  Normal  Speech/Language:   Clear and Coherent  Affect:  anxious  Mood:  anxious  Thought process:  goal directed  Thought content:    some obsessiveness  Sensory/Perceptual disturbances:    WNL  Orientation:  oriented to person, place, time/date, situation, day of week, month of year and year  Attention:  Fair  Concentration:  Good  Memory:  WNL  Fund of knowledge:   Good  Insight:    Good and Fair  Judgment:   Good and Fair  Impulse Control:  Good   Risk Assessment: Danger to Self:  No Self-injurious Behavior: No Danger to Others: No Duty to Warn:no Physical Aggression / Violence:No  Access to Firearms a concern: No  Gang Involvement:No   Subjective: Patient today reports primarily some continued anxiety and difficulties at home with husband. Needs to work further on overthinking and "wants to change the direction of my life, be more consistent in getting tasks done."   Interventions: Solution-Oriented/Positive Psychology  Diagnosis:   ICD-10-CM   1. Generalized anxiety disorder  F41.1      Plan of Care: Patient not signing treatment plan on computer screen due to Covid.  Treatment Goals: Goals remain on treatment plan as patient works on strategies to achieve her goals. Progress will be noted each week in "Progress" section of treatment plan.  Long term goal: Reduce overall level, frequency, and intensity of the anxiety so that daily functioning is not impaired.  Short term goal: Identify, challenge, and replace anxious/fearful/depressive thoughts and self-talk with more positive, realistic, and empowering  thoughts and self-talk that do not support anxiety nor depression.  Strategy: Review, repeat, and reinforce success.  Progress: Patient in today reporting anxiety and frustration and focused on the following:   Still overthinking and trying to change husband. Work on being able to redirect and "shift gears".  Encouraged more focus on self and her goals versus what she feels husband needs to change. Focusing on the negatives versus positives--"what I can do versus what I can't do".  Communication issues addressed in learning to "talk with versus talk to" husband, others. Working on quitting smoking and making some progress per her report.  Attitude is improving some as she focuses on what changes she need to make.  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 Try to stay in the present versus the past or future. Focus on what she can control or impact versus what she cannot. Letting go of "shoulds". Continuing good physical care that includes healthy nutrition and exercise. Letting her faith be a source of emotional support.    Goal review and progress/challenges noted with patient.  Next appt within 2-3 weeks.   Shanon Ace, LCSW

## 2020-01-17 NOTE — Progress Notes (Signed)
Subjective:    CC: R shoulder pain  I, Molly Weber, LAT, ATC, am serving as scribe for Dr. Lynne Leader.  HPI: Pt is a 68 y/o female presenting w/ c/o R shoulder pain after bracing herself to prevent from falling down some stairs on 01/06/20.  She did not actually fall but states that she was holding onto the handrail to prevent a fall and her R arm got pulled into hyperextension.  She recalls hearing/feeling a pop in her R shoulder/chest area at the time of injury.  She saw her PCP initially on 01/07/20.  She locates her current pain to her R superior shoulder and across her R ACJ and R clavicle.  Radiating pain: yes across her R ant chest/clavicle area R shoulder mechanical symptoms: no Aggravating factors: R shoulder overhead AROM; R shoulder extension; cold Treatments tried: Meloxicam; Methocarbamol; Aspercreme w/ lidocaine; CBD cream/gel  Pertinent review of Systems: No fevers or chills  Relevant historical information: Papillary thyroid carcinoma history.  Normal bone density test last checked April 2019.   Objective:    Vitals:   01/18/20 1017  BP: 118/80  Pulse: 77  SpO2: 97%   General: Well Developed, well nourished, and in no acute distress.   MSK: Right chest wall swollen at [facility] joint with bruising inferior to [place] joint on chest wall. Tender palpation at [facility] joint and across chest wall. Right shoulder normal-appearing otherwise. Shoulder motion intact abduction and external rotation and internal rotation however pain with abduction and functional internal rotation. Strength intact abduction external and internal rotation. Pain present with resisted arm abduction chest pectoralis test however drink is intact. Pulses cap refill and sensation are intact distally. Cervical spine normal-appearing nontender normal cervical motion.  Lab and Radiology Results  Diagnostic Limited MSK Ultrasound of: Right shoulder and [place] joint Biceps tendon intact  normal-appearing Subscapularis tendon is intact normal 3 Supraspinatus tendon is intact without obvious tear or retraction. Infraspinatus tendon is intact. AC joint mild degenerative with effusion. Ong joint: Step-off along the bone cortex at distal clavicle and [place] joint with large effusion/hematoma consistent with nondisplaced fracture visible on ultrasound. Impression: Nondisplaced distal clavicle fracture at [facility] joint  X-ray images right shoulder, clavicle, sternum obtained today personally and independently interpreted  Right shoulder: No severe DJD.  No fractures present glenohumeral joint or distal clavicle visible.  Right clavicle: Proximal clavicle [place] joint difficult to visualize.  No large displaced fracture visible.  Surgical clips present in chest.  Sternum: Fracture through distal clavicle [place] joint nondisplaced.  Await formal radiology review    Impression and Recommendations:    Assessment and Plan: 69 y.o. female with right chest wall and shoulder pain after grabbing a hold of the sling while falling.  Functionally her arm was forced into extension.  She felt a pop and developed chest wall pain following the injury.  Ultrasound she has a looks like a nondisplaced fracture at the distal clavicle and [place] joint.  Plain x-rays were obtained and fracture is not very visible on these x-rays however at the sternum lateral x-ray series I believe I can confirm fracture.  However radiology overread is still pending.  If needed further imaging could consider CT scan.  We may proceed to CT scan given her cancer history.  In the interim we will treat as though fracture with relative rest.  Check back in 3 weeks.  PDMP not reviewed this encounter. Orders Placed This Encounter  Procedures  . [place] LIMITED JOINT SPACE STRUCTURES  UP RIGHT(NO LINKED CHARGES)    Order Specific Question:   Reason for Exam (SYMPTOM  OR DIAGNOSIS REQUIRED)    Answer:   R shoulder pain    Order Specific Question:    Preferred imaging location?    Answer:   [place]  . DG Shoulder Right    Standing Status:   Future    Number of Occurrences:   1    Standing Expiration Date:   01/17/2021    Order Specific Question:   Reason for Exam (SYMPTOM  OR DIAGNOSIS REQUIRED)    Answer:   eval shoulder pain poss prox clav frax    Order Specific Question:   Preferred imaging location?    Answer:   [facility]  . DG Clavicle Right    Standing Status:   Future    Number of Occurrences:   1    Standing Expiration Date:   01/17/2021    Order Specific Question:   Reason for Exam (SYMPTOM  OR DIAGNOSIS REQUIRED)    Answer:   eval poss prox clav frac    Order Specific Question:   Preferred imaging location?    Answer:   [facility]  . DG Sternum    Standing Status:   Future    Number of Occurrences:   1    Standing Expiration Date:   01/17/2021    Order Specific Question:   Reason for Exam (SYMPTOM  OR DIAGNOSIS REQUIRED)    Answer:   eval poss clav fracture at [place] joint    Order Specific Question:   Preferred imaging location?    Answer:   [facility]   No orders of the defined types were placed in this encounter.   Discussed warning signs or symptoms. Please see discharge instructions. Patient expresses understanding.   The above documentation has been reviewed and is accurate and complete Lynne Leader, M.D.

## 2020-01-18 ENCOUNTER — Other Ambulatory Visit: Payer: Self-pay

## 2020-01-18 ENCOUNTER — Encounter: Payer: Self-pay | Admitting: Family Medicine

## 2020-01-18 ENCOUNTER — Ambulatory Visit (INDEPENDENT_AMBULATORY_CARE_PROVIDER_SITE_OTHER): Payer: Medicare PPO

## 2020-01-18 ENCOUNTER — Ambulatory Visit: Payer: Medicare PPO | Admitting: Family Medicine

## 2020-01-18 ENCOUNTER — Ambulatory Visit: Payer: Self-pay

## 2020-01-18 VITALS — BP 118/80 | HR 77 | Ht 71.0 in | Wt 175.6 lb

## 2020-01-18 DIAGNOSIS — S4991XA Unspecified injury of right shoulder and upper arm, initial encounter: Secondary | ICD-10-CM | POA: Diagnosis not present

## 2020-01-18 DIAGNOSIS — S59911A Unspecified injury of right forearm, initial encounter: Secondary | ICD-10-CM | POA: Diagnosis not present

## 2020-01-18 DIAGNOSIS — M25511 Pain in right shoulder: Secondary | ICD-10-CM

## 2020-01-18 DIAGNOSIS — Z043 Encounter for examination and observation following other accident: Secondary | ICD-10-CM | POA: Diagnosis not present

## 2020-01-18 DIAGNOSIS — S42017A Nondisplaced fracture of sternal end of right clavicle, initial encounter for closed fracture: Secondary | ICD-10-CM

## 2020-01-18 NOTE — Progress Notes (Signed)
No fracture visible on shoulder x-ray

## 2020-01-18 NOTE — Progress Notes (Signed)
X-ray sternum concerning for fracture of the collarbone where it meets the chest.

## 2020-01-18 NOTE — Patient Instructions (Signed)
Thank you for coming in today.  I think you may have broken the collar bone at the chest joint ([place] joint).   Please get an Xray today before you leave  Please use voltaren gel up to 4x daily for pain as needed.   Recheck in 3 weeks.   Likely will start PT in 3 weeks or so.

## 2020-01-18 NOTE — Progress Notes (Signed)
Clavicle fracture not visible on this x-ray series however was visible on sternum x-ray

## 2020-02-07 ENCOUNTER — Ambulatory Visit (INDEPENDENT_AMBULATORY_CARE_PROVIDER_SITE_OTHER): Payer: Medicare PPO | Admitting: Psychiatry

## 2020-02-07 ENCOUNTER — Other Ambulatory Visit: Payer: Self-pay

## 2020-02-07 DIAGNOSIS — F411 Generalized anxiety disorder: Secondary | ICD-10-CM | POA: Diagnosis not present

## 2020-02-07 NOTE — Progress Notes (Signed)
   I, Wendy Poet, LAT, ATC, am serving as scribe for Dr. Lynne Leader.  Theresa Perez is a 69 y.o. female who presents to [place] [facility] today for f/u of R clavicular fx that she sustained on 01/06/20 when she braced herself on a handrail to prevent herself from falling down the stairs.  She was last seen by Dr. Georgina Snell on 01/18/20 and was advised to use Voltaren gel and use pain as her guide in terms of activity.  Since her last visit, pt reports that her R clavicle and shoulder are feeling better but notes that she'll con't to have intermittent pain and soreness depending on activity.  Diagnostic testing: Sternum, R clavicle and R shoulder XR- 01/18/20  Pertinent review of systems: No fevers or chills  Relevant historical information: Hypertension   Exam:  BP 120/84 (BP Location: Right Arm, Patient Position: Sitting, Cuff Size: Normal)   Pulse 80   Ht 5\' 11"  (1.803 m)   Wt 173 lb 6.4 oz (78.7 kg)   SpO2 96%   BMI 24.18 kg/m  General: Well Developed, well nourished, and in no acute distress.   MSK: Right chest slightly swollen at [facility] joint.  Minimally tender. Right shoulder normal-appearing otherwise normal motion normal strength.     Assessment and Plan: 69 y.o. female with right proximal clavicle fracture at [facility] joint.  Clinically doing exceptionally well.  She is almost completely returned to normal activity.  The fracture was barely visible on prior x-rays.  I do not think that serial x-rays are going to be very helpful especially given her incredible clinical improvement.  Plan for watchful waiting and continued advancing activity as tolerated.  Recheck back with me as needed.   PDMP not reviewed this encounter. No orders of the defined types were placed in this encounter.  No orders of the defined types were placed in this encounter.    Discussed warning signs or symptoms. Please see discharge instructions. Patient expresses  understanding.   The above documentation has been reviewed and is accurate and complete Lynne Leader, M.D.
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
## 2020-02-08 ENCOUNTER — Encounter: Payer: Self-pay | Admitting: Family Medicine

## 2020-02-08 ENCOUNTER — Ambulatory Visit: Payer: Medicare PPO | Admitting: Family Medicine

## 2020-02-08 VITALS — BP 120/84 | HR 80 | Ht 71.0 in | Wt 173.4 lb

## 2020-02-08 DIAGNOSIS — S42017A Nondisplaced fracture of sternal end of right clavicle, initial encounter for closed fracture: Secondary | ICD-10-CM

## 2020-02-08 NOTE — Patient Instructions (Signed)
Thank you for coming in today.  OK to use the voltaren.   Ok to advance your activity as tolerated. Listen to your body and be ready to quit if it hurts a lot.   If you are not thriving let me know and then PT is better option.

## 2020-02-14 ENCOUNTER — Telehealth (INDEPENDENT_AMBULATORY_CARE_PROVIDER_SITE_OTHER): Payer: Medicare PPO | Admitting: Family

## 2020-02-14 ENCOUNTER — Encounter: Payer: Self-pay | Admitting: Family

## 2020-02-14 VITALS — Ht 71.0 in | Wt 173.0 lb

## 2020-02-14 DIAGNOSIS — F4322 Adjustment disorder with anxiety: Secondary | ICD-10-CM

## 2020-02-14 DIAGNOSIS — I10 Essential (primary) hypertension: Secondary | ICD-10-CM

## 2020-02-14 DIAGNOSIS — R509 Fever, unspecified: Secondary | ICD-10-CM

## 2020-02-14 DIAGNOSIS — M791 Myalgia, unspecified site: Secondary | ICD-10-CM

## 2020-02-14 MED ORDER — ATENOLOL-CHLORTHALIDONE 50-25 MG PO TABS: ORAL_TABLET | ORAL | 2 refills | Status: DC

## 2020-02-14 MED ORDER — PREDNISONE 10 MG (21) PO TBPK: ORAL_TABLET | ORAL | 0 refills | Status: DC

## 2020-02-14 MED ORDER — BENZONATATE 200 MG PO CAPS: 200.0000 mg | ORAL_CAPSULE | Freq: Two times a day (BID) | ORAL | 0 refills | Status: DC | PRN

## 2020-02-14 MED ORDER — DULOXETINE HCL 60 MG PO CPEP: 60.0000 mg | ORAL_CAPSULE | Freq: Two times a day (BID) | ORAL | 1 refills | Status: DC

## 2020-02-14 NOTE — Progress Notes (Signed)
Virtual Visit via Video   I connected with patient on 02/14/20 at  3:00 PM EST by a video enabled telemedicine application and verified that I am speaking with the correct person using two identifiers.  Location patient: Home Location provider: [facility], Office Persons participating in the virtual visit: Patient, Provider, CMA   I discussed the limitations of evaluation and management by telemedicine and the availability of in person appointments. The patient expressed understanding and agreed to proceed.  Subjective:   HPI:  69 year old female requests a video visit today with concerns of cough, chest congestion, green phlegm that is thick x 3 days. Vaccinated but not tested for COVID.   ROS:   See pertinent positives and negatives per HPI.  Patient Active Problem List   Diagnosis Date Noted   Adjustment disorder with mixed anxiety and depressed mood 08/02/2019   Memory change 06/25/2019   Smokers' cough ([place]) 12/11/2017   De Quervain's disease (radial styloid tenosynovitis) 04/01/2017   Arthritis of left knee 02/22/2015   Routine general medical examination at a health care facility 12/16/2014   Constipation 07/13/2014   Papillary thyroid carcinoma ([place]) 03/23/2014   Pure hypercholesterolemia 12/08/2013   Varicose veins of lower extremities with other complications 54/10/8117   Essential hypertension 05/19/2007    Social History   Tobacco Use   Smoking status: Current Every Day Smoker    Packs/day: 0.30    Years: 15.00    Pack years: 4.50    Types: Cigarettes   Smokeless tobacco: Never Used   Tobacco comment: Occassional smoker  Substance Use Topics   Alcohol use: Yes    Comment: Rarely    Current Outpatient Medications:    Ascorbic Acid (VITAMIN C PO), Take 1 tablet by mouth daily., Disp: , Rfl:    benzonatate (TESSALON) 200 MG capsule, Take 1 capsule (200 mg total) by mouth 2 (two) times daily as needed for cough., Disp: 20  capsule, Rfl: 0   Black Cohosh 540 MG CAPS, Take 540 mg by mouth 2 (two) times daily. , Disp: , Rfl:    CALCIUM PO, Take 1 tablet by mouth daily., Disp: , Rfl:    COENZYME Q-10 PO, Take 1 tablet by mouth daily., Disp: , Rfl:    Cyanocobalamin (VITAMIN B-12 PO), Take 1 tablet by mouth daily., Disp: , Rfl:    ECHINACEA EXTRACT PO, Take by mouth., Disp: , Rfl:    EUTHYROX 100 MCG tablet, Take 1 tablet by mouth once daily, Disp: 30 tablet, Rfl: 11   EVENING PRIMROSE OIL PO, Take by mouth., Disp: , Rfl:    ferrous sulfate 325 (65 FE) MG tablet, Take 325 mg by mouth every evening. , Disp: , Rfl:    Flaxseed, Linseed, (FLAX SEED OIL PO), Take by mouth., Disp: , Rfl:    fluticasone (FLONASE) 50 MCG/ACT nasal spray, Use 2 spray(s) in each nostril once daily, Disp: 48 g, Rfl: 2   Ginkgo Biloba (GNP GINGKO BILOBA EXTRACT PO), Take 1 tablet by mouth daily., Disp: , Rfl:    LORazepam (ATIVAN) 0.5 MG tablet, TAKE 1/2 (ONE-HALF) TABLET BY MOUTH ONCE DAILY AS NEEDED FOR ANXIETY, Disp: 20 tablet, Rfl: 2   Misc Natural Products (GLUCOSAMINE CHOND COMPLEX/MSM) TABS, Take 1 tablet by mouth 2 (two) times daily., Disp: , Rfl:    Multiple Vitamins-Minerals (MULTIVITAMIN WITH MINERALS) tablet, Take 1 tablet by mouth daily., Disp: , Rfl:    Nutritional Supplements (DHEA PO), Take 1 tablet by mouth daily.,  Disp: , Rfl:    predniSONE (STERAPRED UNI-PAK 21 TAB) 10 MG (21) TBPK tablet, As directed, Disp: 21 tablet, Rfl: 0   Turmeric (QC TUMERIC COMPLEX PO), Take by mouth., Disp: , Rfl:    atenolol-chlorthalidone (TENORETIC) 50-25 MG tablet, 1/2  Tablet daily, Disp: 45 tablet, Rfl: 2   butalbital-acetaminophen-caffeine (ESGIC) 50-325-40 MG tablet, Take 1 tablet by mouth 2 (two) times daily as needed for headache. (Patient not taking: Reported on 02/14/2020), Disp: 30 tablet, Rfl: 0   DULoxetine (CYMBALTA) 60 MG capsule, Take 1 capsule (60 mg total) by mouth 2 (two) times daily., Disp: 180 capsule, Rfl:  1  Allergies  Allergen Reactions   Permethrin Rash    Objective:   Ht 5\' 11"  (1.803 m)    Wt 173 lb (78.5 kg)    BMI 24.13 kg/m   Patient is well-developed, well-nourished in no acute distress.  Resting comfortably at home.  Head is normocephalic, atraumatic.  No labored breathing.  Speech is clear and coherent with logical content.  Patient is alert and oriented at baseline.   Assessment and Plan:    Gwenna was seen today for sore throat.  Diagnoses and all orders for this visit:  Myalgia  Fever and chills  Adjustment disorder with anxiety -     DULoxetine (CYMBALTA) 60 MG capsule; Take 1 capsule (60 mg total) by mouth 2 (two) times daily.  Essential hypertension -     atenolol-chlorthalidone (TENORETIC) 50-25 MG tablet; 1/2  Tablet daily  Other orders -     predniSONE (STERAPRED UNI-PAK 21 TAB) 10 MG (21) TBPK tablet; As directed -     benzonatate (TESSALON) 200 MG capsule; Take 1 capsule (200 mg total) by mouth 2 (two) times daily as needed for cough.  Advised COVID-19 testing. Check the [place] site or get a rapid antigen test from the pharmacy  Refilled meds as requested. Follow-up with PCP as scheduled.   Kennyth Arnold, FNP 02/14/2020

## 2020-02-15 ENCOUNTER — Telehealth: Payer: Self-pay | Admitting: Internal Medicine

## 2020-02-15 NOTE — Telephone Encounter (Signed)
Patient calling making [place] aware that she tested positive for covid today with a rapid test and they are also sending out a PCR. She wants to talk to someone about what she needs to do next.  [phone]

## 2020-02-16 NOTE — Telephone Encounter (Addendum)
Pt informed of below.  She is planning to take her 2nd at home test on 12/31 per the directions on the box. She wants to know if it is negative, does she still quarantine x 10 days or can she wait 5 days since the CDC just changed the guidelines. Please advise

## 2020-02-16 NOTE — Telephone Encounter (Signed)
Can make virtual with provider to discuss in detail if desired. Quarantine for 10 days, alert any contacts in last 10 days to monitor for symptoms and test if any symptoms. For SOB go to urgent care or ER.

## 2020-02-17 NOTE — Telephone Encounter (Signed)
Pt informed of below.  

## 2020-02-17 NOTE — Telephone Encounter (Signed)
More likely to be false negative than positive so she does have covid-19. Should still quarantine.

## 2020-02-28 ENCOUNTER — Encounter: Payer: Self-pay | Admitting: Internal Medicine

## 2020-02-28 ENCOUNTER — Ambulatory Visit: Payer: Medicare PPO | Admitting: Psychiatry

## 2020-02-28 NOTE — Progress Notes (Unsigned)
      [facility] Counselor/Therapist Progress Note  Patient ID: SHEMIA BEVEL, MRN: [id number],    Date: 02/28/2020  Time Spent: ***   Treatment Type: {CHL AMB THERAPY TYPES:[id number]}  Reported Symptoms: ***  Mental Status Exam:  Appearance:   {PSY:22683}     Behavior:  {PSY:21022743}  Motor:  {PSY:22302}  Speech/Language:   {PSY:22685}  Affect:  {PSY:22687}  Mood:  {PSY:31886}  Thought process:  {PSY:31888}  Thought content:    {PSY:[phone]}  Sensory/Perceptual disturbances:    {[phone]}  Orientation:  {PSY:30297}  Attention:  {PSY:22877}  Concentration:  {PSY:504-319-8495}  Memory:  {PSY:916-879-7077}  Fund of knowledge:   {PSY:504-319-8495}  Insight:    {PSY:504-319-8495}  Judgment:   {PSY:504-319-8495}  Impulse Control:  {PSY:504-319-8495}   Risk Assessment: Danger to Self:  {PSY:22692} Self-injurious Behavior: {PSY:22692} Danger to Others: {PSY:22692} Duty to Warn:{PSY:311194} Physical Aggression / Violence:{PSY:21197} Access to Firearms a concern: {PSY:21197} Gang Involvement:{PSY:21197}  Subjective: ***   Interventions: {PSY:248-058-4905}  Diagnosis:No diagnosis found.  Plan: ***  Shanon Ace, LCSW

## 2020-03-03 ENCOUNTER — Other Ambulatory Visit: Payer: Self-pay | Admitting: Internal Medicine

## 2020-03-03 NOTE — Telephone Encounter (Signed)
Refill request for pending Rx Larazepam 0.5mg   last OV and refill with Dr. Sharlet Salina 08/16/19, OV with Dr. Jasmine December 01/07/20. Please advise.

## 2020-03-20 ENCOUNTER — Other Ambulatory Visit: Payer: Self-pay

## 2020-03-20 ENCOUNTER — Ambulatory Visit (INDEPENDENT_AMBULATORY_CARE_PROVIDER_SITE_OTHER): Payer: Medicare PPO | Admitting: Psychiatry

## 2020-03-20 DIAGNOSIS — F411 Generalized anxiety disorder: Secondary | ICD-10-CM

## 2020-03-20 NOTE — Progress Notes (Signed)
[facility] Counselor/Therapist Progress Note  Patient ID: Theresa Perez, MRN: [id number],    Date: 03/20/2020  Time Spent:  50 minutes    1:00pm to 1:50pm  Treatment Type: Individual Therapy  Reported Symptoms: anxiety  Mental Status Exam:  Appearance:   Neat     Behavior:  Appropriate, Sharing and Motivated  Motor:  Normal  Speech/Language:   Clear and Coherent  Affect:  anxious  Mood:  anxious  Thought process:  goal directed  Thought content:    some rumination  Sensory/Perceptual disturbances:    WNL  Orientation:  oriented to person, place, time/date, situation, day of week, month of year and year  Attention:  Good  Concentration:  Good and Fair  Memory:  WNL  Fund of knowledge:   Good  Insight:    Good  Judgment:   Good  Impulse Control:  Good   Risk Assessment: Danger to Self:  No Self-injurious Behavior: No Danger to Others: No Duty to Warn:no Physical Aggression / Violence:No  Access to Firearms a concern: No  Gang Involvement:No   Subjective: Patient today reports anxiety, death of father-in-law, and husband is to have surgery on his back.    Interventions: Cognitive Behavioral Therapy and Solution-Oriented/Positive Psychology  Diagnosis:   ICD-10-CM   1. Generalized anxiety disorder  F41.1     Plan of Care: Patient not signing treatment plan on computer screen due to Covid.  Treatment Goals: Goals remain on treatment plan as patient works on strategies to achieve her goals. Progress will be noted each week in "Progress" section of treatment plan.  Long term goal: Reduce overall level, frequency, and intensity of the anxiety so that daily functioning is not impaired.  Short term goal: Identify, challenge, and replace anxious/fearful/depressive thoughts and self-talk with more positive, realistic, and empowering thoughts and self-talk that do not support anxiety nor depression.  Strategy: Review, repeat, and reinforce  success.  Progress: Patient in today reporting anxiety re: recent death of father-in-law, husband's upcoming surgery, and personal issues. Does feel that "I'm doing some better as evidenced in being less anxious overall at times and in getting more tasks completed at home. Had Covid and had to quarantine. Is physically well now and is back involved in bowling and some tennis. Anxiety still an issue in communicating with husband and husband's health issues (back, bladder).  Is showing some strength in dealing with multiple concerns with her and her husband. Still using her gratitude list often "and that helps." Involved in a church circle group which is supportive. Motivation is improving some.  Main symptom reported is anxiety and today's focus is: Concern about husband's upcoming back surgery.  "Things that feed my anxiety" (overthinkg, lack of motivation, focus on husband rather than me). Needing to talk WITH husband rather than AT him. Needing to quit smoking.  Has had some success. Becoming better able to identify the changes she wants to make.. To focus more on herself than what "husband needs to change."  Patient goal-directed behaviors to continue working on: Has healed physically and can now do bowling, hopefully pickleball soon. Practicing to be a better active listener. Trying to let go of "shoulds" Working to stay in present versus past or future. Continue interrupting anxious thoughts and replace with more positive thoughts. Using meditation, mindfulness exercises, Gratitude list, and her faith. Focusing on what I can control or change.  Goal review and progress/challenges noted with patient.  Next appointment within 2 to 3  weeks.   Shanon Ace, LCSW

## 2020-04-10 ENCOUNTER — Ambulatory Visit (INDEPENDENT_AMBULATORY_CARE_PROVIDER_SITE_OTHER): Payer: Medicare PPO | Admitting: Psychiatry

## 2020-04-10 ENCOUNTER — Other Ambulatory Visit: Payer: Self-pay

## 2020-04-10 DIAGNOSIS — F411 Generalized anxiety disorder: Secondary | ICD-10-CM

## 2020-04-10 NOTE — Progress Notes (Signed)
[facility] Counselor/Therapist Progress Note  Patient ID: Theresa Perez, MRN: [id number],    Date: 04/10/2020  Time Spent: 60 minutes   11:00am to 12:00noon   Treatment Type: Individual Therapy  Reported Symptoms: anxiety, "sometimes I'll have a good day and other days Not!"  Mental Status Exam:  Appearance:   Neat     Behavior:  Appropriate, Sharing and Motivated  Motor:  Normal  Speech/Language:   Clear and Coherent  Affect:  anxious  Mood:  anxious  Thought process:  goal directed  Thought content:    some tangentiality  Sensory/Perceptual disturbances:    WNL  Orientation:  oriented to person, place, time/date, situation, day of week, month of year and year  Attention:  Good  Concentration:  Good and Fair  Memory:  WNL  Fund of knowledge:   Good  Insight:    Good and Fair  Judgment:   Good  Impulse Control:  Good   Risk Assessment: Danger to Self:  No Self-injurious Behavior: No Danger to Others: No Duty to Warn:no Physical Aggression / Violence:No  Access to Firearms a concern: No  Gang Involvement:No   Subjective: Patient in today reporting anxiety, "some days are worse than others".  Interventions: Cognitive Behavioral Therapy and Solution-Oriented/Positive Psychology  Diagnosis:   ICD-10-CM   1. Generalized anxiety disorder  F41.1      Plan of Care: Patient not signing treatment plan on computer screen due to Covid.  Treatment Goals: Goals remain on treatment plan as patient works on strategies to achieve her goals. Progress will be noted each week in "Progress" section of treatment plan.  Long term goal: Reduce overall level, frequency, and intensity of the anxiety so that daily functioning is not impaired.  Short term goal: Identify, challenge, and replace anxious/fearful/depressive thoughts and self-talk with more positive, realistic, and empowering thoughts and self-talk that do not support anxiety nor  depression.  Strategy: Review, repeat, and reinforce success.  Progress: Patient today more anxious but seemed to be somewhat caused by a mixup in another appt time this morning with someone at their home to get some work done.  Sad about 80 yr old cat whose health is getting worse and patient struggling with decision about what to do going forward.  Is to speak with her vet soon and see what he feels would be best for cat. Anxiety more related to her personally and within marriage, and regarding her husband's surgery that has been postponed due to Covid. "I need to figure out when I wake up in an anxious/depressed mood." Discussed this more at length with patient and concluded the following would be helpful : use her book of prayers, work on gratitude list, make sure I get out of bed and get busy on specific tasks, spending time with her dog, doing tasks at home, and pay attention to what I can and cannot control and focus on what I can control. Worked more on some communication issues with husband today. Overall, patient is showing some increase in motivation, although at times struggles with lack of motivation. Encouraged to use her gratitude list more often, which has helped her in past. Involvement in her church groups is supportive and helpful.  Focus and goal-directed behaviors patient is to work on between sessions: Look more at what she needs to change rather that overly focus on husband's need to change. Work to stop smoking as that is a goal of hers. Stop mind from wandering into  the past on things I can' change. Things that feed her anxiety, overthinking. Speak WITH husband rather that AT him. Getting and staying involved with others doing crafts, choir, exercise. Let go of "shoulds" in regards to herself and others. Practice being a better active listener. Stay in the present versus the past or future. Focus on what she can control or change versus cannot change or control. Work on  "doing more to create change" rather than and just writing about it or thinking about it.   Goal review and progress/challenges noted with patient.  Next appointment within 3 weeks.   Shanon Ace, LCSW

## 2020-04-20 DIAGNOSIS — H524 Presbyopia: Secondary | ICD-10-CM | POA: Diagnosis not present

## 2020-04-24 ENCOUNTER — Ambulatory Visit (INDEPENDENT_AMBULATORY_CARE_PROVIDER_SITE_OTHER): Payer: Medicare PPO | Admitting: Psychiatry

## 2020-04-24 ENCOUNTER — Other Ambulatory Visit: Payer: Self-pay

## 2020-04-24 DIAGNOSIS — F411 Generalized anxiety disorder: Secondary | ICD-10-CM | POA: Diagnosis not present

## 2020-04-24 NOTE — Progress Notes (Signed)
[facility] Counselor/Therapist Progress Note  Patient ID: Theresa Perez, MRN: [id number],    Date: 04/24/2020  Time Spent: 50 minutes   3:00pm to 3:50pm  Treatment Type: Individual Therapy  Reported Symptoms: anxiety  Mental Status Exam:  Appearance:   Neat     Behavior:  Sharing and Motivated  Motor:  Normal  Speech/Language:   Clear and Coherent  Affect:  anxious  Mood:  anxious  Thought process:  some tangentiality  Thought content:    some obsessiveness  Sensory/Perceptual disturbances:    WNL  Orientation:  oriented to person, place, time/date, situation, day of week, month of year and year  Attention:  Good  Concentration:  Good  Memory:  WNL  Fund of knowledge:   Good  Insight:    Good and Fair  Judgment:   Good and Fair  Impulse Control:  Good   Risk Assessment: Danger to Self:  No Self-injurious Behavior: NoDanger to Others: No Duty to Warn:no Physical Aggression / Violence:No  Access to Firearms a concern: No  Gang Involvement:No   Subjective: Patient in today reporting anxiety and is making progress.   Interventions: Cognitive Behavioral Therapy and Solution-Oriented/Positive Psychology  Diagnosis:   ICD-10-CM   1. Generalized anxiety disorder  F41.1      Plan of Care: Patient not signing treatment plan on computer screen due to Covid.  Treatment Goals: Goals remain on treatment plan as patient works on strategies to achieve her goals. Progress will be noted each week in "Progress" section of treatment plan.  Long term goal: Reduce overall level, frequency, and intensity of the anxiety so that daily functioning is not impaired.  Short term goal: Identify, challenge, and replace anxious/fearful/depressive thoughts and self-talk with more positive, realistic, and empowering thoughts and self-talk that do not support anxiety nor depression.  Strategy: Review, repeat, and reinforce success.  Progress: Patient in today  reporting anxiety (mostly personal and marital related) and that she is making progress. Staying more positive. Stopped making endless list of what I'm to do, and instead taking one thing at the time. Trying to stay more motivated. Exercising at least a couple times weekly is helping. Husband "just hasn't been doing as much since his motorcycle accident and has upcoming back surgery." Surgery currently postponed due to Covid.  Pet cat of 17 yrs died since last appt and patient working through her grief on this.  Overall mood currently is not as anxious as previous appts.  Is able to recognize how she still is trying to change other people, but has become more aware of that tendency, just hard to change, and still putting forth effort encouraged by therapist. Mood is not as depressed nor as anxious. On 1-10 scale for depression she reports being a "2".  On 1-10 scale for anxiety, reports she is now a "5" which is progress.  Worked with her anxious thoughts using short term goal and strategy in Tx Plan in helping her interrupt the anxious thoughts and replacing them with more reality-based and positive thoughts that do not feed her anxiety. Reviewed some today from prior session, some of the communication skills to help her better in communicating with husband. Starting a Bible Study this week at her church and she feels that will be good for her.   Goal-related behaviors to work on between sessions: Focus more on being an active listener. Concentrate on what she needs to change versus what changes husband needs to make. Try to  follow through on quitting smoking cigarettes. Stop overthinking. Speaking WITH husband rather than AT him. Stay involved in group activities with other people including choir, crafting, exercise. Stay in the present focusing on what she can control versus cannot.   Goal review and progress/challenges noted with patient.  Next appointment within 3 weeks.   Shanon Ace,  LCSW

## 2020-05-01 ENCOUNTER — Ambulatory Visit: Payer: Medicare PPO | Admitting: Psychiatry

## 2020-05-08 ENCOUNTER — Ambulatory Visit (INDEPENDENT_AMBULATORY_CARE_PROVIDER_SITE_OTHER): Payer: Medicare PPO | Admitting: Internal Medicine

## 2020-05-08 ENCOUNTER — Other Ambulatory Visit: Payer: Self-pay

## 2020-05-08 ENCOUNTER — Encounter: Payer: Self-pay | Admitting: Internal Medicine

## 2020-05-08 VITALS — BP 128/78 | HR 67 | Temp 98.6°F | Resp 18 | Ht 71.0 in | Wt 168.2 lb

## 2020-05-08 DIAGNOSIS — C73 Malignant neoplasm of thyroid gland: Secondary | ICD-10-CM

## 2020-05-08 DIAGNOSIS — R202 Paresthesia of skin: Secondary | ICD-10-CM

## 2020-05-08 DIAGNOSIS — F4323 Adjustment disorder with mixed anxiety and depressed mood: Secondary | ICD-10-CM | POA: Diagnosis not present

## 2020-05-08 DIAGNOSIS — J3089 Other allergic rhinitis: Secondary | ICD-10-CM

## 2020-05-08 DIAGNOSIS — R413 Other amnesia: Secondary | ICD-10-CM | POA: Diagnosis not present

## 2020-05-08 DIAGNOSIS — R200 Anesthesia of skin: Secondary | ICD-10-CM

## 2020-05-08 LAB — COMPREHENSIVE METABOLIC PANEL
ALT: 22 U/L (ref 0–35)
AST: 26 U/L (ref 0–37)
Albumin: 4.4 g/dL (ref 3.5–5.2)
Alkaline Phosphatase: 42 U/L (ref 39–117)
BUN: 11 mg/dL (ref 6–23)
CO2: 31 mEq/L (ref 19–32)
Calcium: 9 mg/dL (ref 8.4–10.5)
Chloride: 103 mEq/L (ref 96–112)
Creatinine, Ser: 0.68 mg/dL (ref 0.40–1.20)
GFR: 89.39 mL/min (ref >60.00)
Glucose, Bld: 71 mg/dL (ref 70–99)
Potassium: 3.5 mEq/L (ref 3.5–5.1)
Sodium: 142 mEq/L (ref 135–145)
Total Bilirubin: 0.4 mg/dL (ref 0.2–1.2)
Total Protein: 6.9 g/dL (ref 6.0–8.3)

## 2020-05-08 LAB — CBC
HCT: 38.9 % (ref 36.0–46.0)
Hemoglobin: 13.3 g/dL (ref 12.0–15.0)
MCHC: 34.2 g/dL (ref 30.0–36.0)
MCV: 89.6 fl (ref 78.0–100.0)
Platelets: 341 10*3/uL (ref 150.0–400.0)
RBC: 4.35 Mil/uL (ref 3.87–5.11)
RDW: 13.3 % (ref 11.5–15.5)
WBC: 7.1 10*3/uL (ref 4.0–10.5)

## 2020-05-08 LAB — T4, FREE: Free T4: 0.77 ng/dL (ref 0.60–1.60)

## 2020-05-08 LAB — VITAMIN B12: Vitamin B-12: 820 pg/mL (ref 211–911)

## 2020-05-08 LAB — TSH: TSH: 5.08 u[IU]/mL — ABNORMAL HIGH (ref 0.35–4.50)

## 2020-05-08 LAB — VITAMIN D 25 HYDROXY (VIT D DEFICIENCY, FRACTURES): VITD: 25.82 ng/mL — ABNORMAL LOW (ref 30.00–100.00)

## 2020-05-08 LAB — HEMOGLOBIN A1C: Hgb A1c MFr Bld: 5.6 % (ref 4.6–6.5)

## 2020-05-08 MED ORDER — BUPROPION HCL 75 MG PO TABS: 75.0000 mg | ORAL_TABLET | Freq: Every day | ORAL | 1 refills | Status: DC

## 2020-05-08 NOTE — Patient Instructions (Signed)
We have added wellbutrin to take 1/2 pill daily to see if this helps more.  We are checking the labs today to check on the numbness/tingling in the hands.

## 2020-05-08 NOTE — Progress Notes (Signed)
° °  Subjective:   Patient ID: Theresa Perez, female    DOB: 15-May-1950, 70 y.o.   MRN: [id number]  HPI The patient is a 70 YO female coming in for concerns about ongoing anxiety (taking cymbalta 120 mg daily, feels that this is not working so well, having marital problems and her husband is not willing to work on this, she is in counseling and feels that this is helping some, would like a booster to her mood as she feels she is not making enough progress) and memory (her grandchild is concerned, there is some alzheimer's in the family, she does not feel this is progressive, she does use lists, no problems with using appliances or getting lost going places), and sinuses (some pressure at the forehead region, denies taking otc for this currently, denies fevers or chills, denies nose drainage, sometimes ears pop).   Review of Systems  Constitutional: Negative.   HENT: Positive for sinus pressure.   Eyes: Negative.   Respiratory: Negative for cough, chest tightness and shortness of breath.   Cardiovascular: Negative for chest pain, palpitations and leg swelling.  Gastrointestinal: Negative for abdominal distention, abdominal pain, constipation, diarrhea, nausea and vomiting.  Musculoskeletal: Negative.   Skin: Negative.   Neurological: Negative.        Memory change   Psychiatric/Behavioral: Positive for decreased concentration and dysphoric mood. The patient is nervous/anxious.     Objective:  Physical Exam Constitutional:      Appearance: She is well-developed.  HENT:     Head: Normocephalic and atraumatic.  Cardiovascular:     Rate and Rhythm: Normal rate and regular rhythm.  Pulmonary:     Effort: Pulmonary effort is normal. No respiratory distress.     Breath sounds: Normal breath sounds. No wheezing or rales.  Abdominal:     General: Bowel sounds are normal. There is no distension.     Palpations: Abdomen is soft.     Tenderness: There is no abdominal tenderness. There is no  rebound.  Musculoskeletal:     Cervical back: Normal range of motion.  Skin:    General: Skin is warm and dry.  Neurological:     Mental Status: She is alert and oriented to person, place, and time.     Coordination: Coordination normal.     Vitals:   05/08/20 1110  BP: 128/78  Pulse: 67  Resp: 18  Temp: 98.6 F (37 C)  TempSrc: Oral  SpO2: 97%  Weight: 168 lb 3.2 oz (76.3 kg)  Height: 5\' 11"  (1.803 m)    This visit occurred during the SARS-CoV-2 public health emergency.  Safety protocols were in place, including screening questions prior to the visit, additional usage of staff PPE, and extensive cleaning of exam room while observing appropriate contact time as indicated for disinfecting solutions.   Assessment & Plan:

## 2020-05-10 NOTE — Assessment & Plan Note (Signed)
Add 1/2 pill wellbutrin to her cymbalta 120 mg daily to see if this helps. We can continue this for a period of a few months to help her with this flare up of her mood. Encouraged her to continue with counseling.

## 2020-05-10 NOTE — Assessment & Plan Note (Signed)
Overall sounds stable, we will more aggressively treat her mood problems to see if this helps with memory.

## 2020-05-10 NOTE — Assessment & Plan Note (Signed)
Advised to start otc zyrtec to help with pressure like sensation.

## 2020-05-10 NOTE — Assessment & Plan Note (Signed)
Given the tingling sensation in her head will check TSH and free T4 levels.

## 2020-05-13 ENCOUNTER — Other Ambulatory Visit: Payer: Self-pay | Admitting: Internal Medicine

## 2020-05-13 DIAGNOSIS — F4322 Adjustment disorder with anxiety: Secondary | ICD-10-CM

## 2020-05-13 DIAGNOSIS — I10 Essential (primary) hypertension: Secondary | ICD-10-CM

## 2020-05-15 ENCOUNTER — Encounter: Payer: Medicare PPO | Admitting: Psychiatry

## 2020-05-15 ENCOUNTER — Ambulatory Visit (INDEPENDENT_AMBULATORY_CARE_PROVIDER_SITE_OTHER): Payer: Medicare PPO | Admitting: Psychiatry

## 2020-05-15 ENCOUNTER — Other Ambulatory Visit: Payer: Self-pay

## 2020-05-15 DIAGNOSIS — F411 Generalized anxiety disorder: Secondary | ICD-10-CM | POA: Diagnosis not present

## 2020-05-15 NOTE — Progress Notes (Signed)
This encounter was created in error - please disregard.
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
## 2020-05-15 NOTE — Progress Notes (Signed)
[facility] Counselor/Therapist Progress Note  Patient ID: Theresa Perez, MRN: [id number],    Date: 05/15/2020  Time Spent: 50 minutes   12:10pm to 1:00pm  Treatment Type: Individual Therapy  Reported Symptoms: anxiety, some irritability  Mental Status Exam:  Appearance:   Casual     Behavior:  Appropriate, Sharing and Motivated  Motor:  Normal  Speech/Language:   Clear and Coherent  Affect:  anxious, some irritability  Mood:  anxious  Thought process:  goal directed  Thought content:    some ruminating  Sensory/Perceptual disturbances:    WNL  Orientation:  oriented to person, place, time/date, situation, day of week, month of year and year  Attention:  Good  Concentration:  Good  Memory:  WNL  Fund of knowledge:   Good  Insight:    Good and Fair  Judgment:   Good  Impulse Control:  Good   Risk Assessment: Danger to Self:  No Self-injurious Behavior: No Danger to Others: No Duty to Warn:no Physical Aggression / Violence:No  Access to Firearms a concern: No  Gang Involvement:No   Subjective: Patient today reporting anxiety and irritability. Feels she has handled things as well as possible more recently.  Some ruminating. Most anxiety is related to marital relationship.   Interventions: Cognitive Behavioral Therapy and Solution-Oriented/Positive Psychology  Diagnosis:   ICD-10-CM   1. Generalized anxiety disorder  F41.1      Plan of Care: Patient not signing treatment plan on computer screen due to Covid.  Treatment Goals: Goals remain on treatment plan as patient works on strategies to achieve her goals. Progress will be noted each week in "Progress" section of treatment plan.  Long term goal: Reduce overall level, frequency, and intensity of the anxiety so that daily functioning is not impaired.  Short term goal: Identify, challenge, and replace anxious/fearful/depressive thoughts and self-talk with more positive, realistic, and empowering  thoughts and self-talk that do not support anxiety nor depression.   Strategy: Review, repeat, and reinforce success.  Progress: Patient today reporting anxiety and irritability.  Most anxiety is related to marital relationship and to patient's stress and not being reasonable in what she expects to accomplish. Worked today on her anxious thoughts and feelings, per her short-term goal and treatment plan above.  States she needs to be more positive and stop making so many lists "of what I need to do". Wants to use her faith in feeling more peaceful and hopeful. Talks almost non-stop at times "thinking things through" and obsessing on what all she needs to get done.  Worked today on interrupting that pattern and limiting her list and having time to actually "live life" rather than filling every hour with some type of task.Talked further about things that get her off track and aggravate her anxiety.  Decided she needs to: "Limit online games, cut down on smoking, instead of thinking and talking I need to focus on being, praying more and let my faith help to lessen some of my anxiety."  Encouraged patient to follow-up on some of these changes in addition to exercising, staying in touch with people who are encouraging and supportive of her, being involved with her extended family, work on the personal changes that she wants to accomplish versus trying to change other people, staying in the present focusing on what she can control, interrupt anxious thoughts and replace with more positive and encouraging thoughts, and become more self-aware.  Goal related behaviors patient is to work on  between sessions: Active listening. Follow through on quitting smoking. Stop overthinking and overanalyzing. Stay involved in groups including choir, crafting, exercise.   Goal review and progress/challenges noted with patient.  Next appointment within 2 to 3 weeks.   Shanon Ace,  LCSW

## 2020-05-17 DIAGNOSIS — L600 Ingrowing nail: Secondary | ICD-10-CM | POA: Diagnosis not present

## 2020-05-17 DIAGNOSIS — M25774 Osteophyte, right foot: Secondary | ICD-10-CM | POA: Diagnosis not present

## 2020-05-22 ENCOUNTER — Ambulatory Visit: Payer: Medicare PPO | Admitting: Psychiatry

## 2020-05-29 ENCOUNTER — Ambulatory Visit: Payer: Medicare PPO | Admitting: Psychiatry

## 2020-06-01 DIAGNOSIS — L600 Ingrowing nail: Secondary | ICD-10-CM | POA: Diagnosis not present

## 2020-06-06 ENCOUNTER — Telehealth: Payer: Self-pay | Admitting: Internal Medicine

## 2020-06-06 NOTE — Telephone Encounter (Signed)
See below

## 2020-06-06 NOTE — Telephone Encounter (Signed)
   Patient requesting refill for LORazepam (ATIVAN) 0.5 MG tablet   [facility] ([facility]), [place] [facility] [place]

## 2020-06-07 DIAGNOSIS — L600 Ingrowing nail: Secondary | ICD-10-CM | POA: Diagnosis not present

## 2020-06-07 MED ORDER — LORAZEPAM 0.5 MG PO TABS: 0.5000 mg | ORAL_TABLET | Freq: Every day | ORAL | 1 refills | Status: DC | PRN

## 2020-06-12 ENCOUNTER — Other Ambulatory Visit: Payer: Self-pay

## 2020-06-12 ENCOUNTER — Ambulatory Visit (INDEPENDENT_AMBULATORY_CARE_PROVIDER_SITE_OTHER): Payer: Medicare PPO | Admitting: Psychiatry

## 2020-06-12 DIAGNOSIS — F411 Generalized anxiety disorder: Secondary | ICD-10-CM | POA: Diagnosis not present

## 2020-06-12 NOTE — Progress Notes (Signed)
[facility] Counselor/Therapist Progress Note  Patient ID: Theresa Perez, MRN: [id number],    Date: 06/12/2020  Time Spent: 60 minutes    11:00am to 12:00noon   Treatment Type: Individual Therapy  Reported Symptoms: anxiety (some improvement)   Mental Status Exam:  Appearance:   Casual     Behavior:  Appropriate, Sharing and Motivated  Motor:  Normal  Speech/Language:   Clear and Coherent  Affect:  anxious  Mood:  anxious  Thought process:  normal  Thought content:    some ruminating and some obsessing  Sensory/Perceptual disturbances:    WNL  Orientation:  oriented to person, place, time/date, situation, day of week, month of year and year  Attention:  Good  Concentration:  Good and Fair  Memory:  WNL  Fund of knowledge:   Good  Insight:    Good and Fair  Judgment:   Good  Impulse Control:  Good   Risk Assessment: Danger to Self:  No Self-injurious Behavior: No Danger to Others: No Duty to Warn:no Physical Aggression / Violence:No  Access to Firearms a concern: No  Gang Involvement:No   Subjective: Patient today reports anxiety, and has experienced some improvement. States she used to feel so anxious when she would go into stores and other places during Covid. See Progress Note below.  Interventions: Solution-Oriented/Positive Psychology and Ego-Supportive  Diagnosis:   ICD-10-CM   1. Generalized anxiety disorder  F41.1      Plan of Care: Patient not signing treatment plan on computer screen due to Covid.  Treatment Goals: Goals remain on treatment plan as patient works on strategies to achieve her goals. Progress will be noted each week in "Progress" section of treatment plan.  Long term goal: Reduce overall level, frequency, and intensity of the anxiety so that daily functioning is not impaired.  Short term goal: Identify, challenge, and replace anxious/fearful/depressive thoughts and self-talk with more positive, realistic, and  empowering thoughts and self-talk that do not support anxiety nor depression.   Strategy: Review, repeat, and reinforce success.  Progress: She is in today reporting anxiety and is noticing progress.  Doing better with using shorter lists and that is helping her anxiety especially at home. Progressing in getting out more with less anxiety "but I still struggle with anxiety when I wake up in the mornings but not every morning, when I'm trying to feel better about myself, when involved with various family situations. Previous irritability has gone. Stress in marital relationship showing some improvement in communication, and the ability "on some occasions" to problem-solve together. She is encouraged to practice some of the listening skills discussed in sessions, to focus on listening to really hear the other person versus listening only to respond. Used her long and short term goals in tx plan above to work more today on her anxious thoughts and feelings, interrupting them and replacing them with more positive and empowering thoughts. Is noticing she is becoming more positive in some ways and sometimes with husband. Is working to reduce her rigid list-making which tended to stress patient more. Trying to have a better balance with being involved in various activities and being able to enjoy it without becoming more rigid with lots of expectations, versus micro-managing everything and it having to "turn out a certain way".  Still talking almost non-stop at times but not quite as bad today and it does seem that happens some as she doesn't seem to feel heard by others much. Feels her faith  helps her in decreasing time spent with online games, decreasing smoking, "praying more and trying to use my faith to help with anxiety."  Encouraged patient to continue working with her treatment plan goals above as she is showing progress more this session than previously and I think it has been cumulative.  Also  encouraged her to stay in touch with supportive people and extended family, staying in the present and focus on what she can control, interrupting anxious thoughts and replacing them with more reality-based thoughts, continuing work to become more self-aware, working on decreasing her overthinking and overanalyzing, practice more active listening where she is listening to really hear the other person and not just to respond, staying involved in groups such as crafting, exercise, and her church choir, and following through on quitting smoking where she is definitely making some progress.   Goal review and progress/challenges noted with patient.  Next appointment within 2 to 3 weeks.   Shanon Ace, LCSW

## 2020-06-15 DIAGNOSIS — L600 Ingrowing nail: Secondary | ICD-10-CM | POA: Diagnosis not present

## 2020-06-26 ENCOUNTER — Other Ambulatory Visit: Payer: Self-pay

## 2020-06-26 ENCOUNTER — Ambulatory Visit (INDEPENDENT_AMBULATORY_CARE_PROVIDER_SITE_OTHER): Payer: Medicare PPO | Admitting: Psychiatry

## 2020-06-26 DIAGNOSIS — F411 Generalized anxiety disorder: Secondary | ICD-10-CM | POA: Diagnosis not present

## 2020-06-26 NOTE — Progress Notes (Signed)
[facility] Counselor/Therapist Progress Note  Patient ID: CECIL BIXBY, MRN: [id number],    Date: 06/26/2020  Time Spent: 50 minutes   12:10pm to 1:00pm  Treatment Type: Individual Therapy  Reported Symptoms: anxiety, family tensions increased recently  Mental Status Exam:  Appearance:   Casual     Behavior:  Appropriate, Sharing and Motivated  Motor:  Normal  Speech/Language:   anxious  Affect:  anxious   Mood:  anxious  Thought process:  some tangentiality  Thought content:    some obsessiveness  Sensory/Perceptual disturbances:    WNL  Orientation:  oriented to person, place, time/date, situation, day of week, month of year and year  Attention:  Fair  Concentration:  Fair  Memory:  [place] of knowledge:   Good  Insight:    Good and Fair  Judgment:   Good and Fair  Impulse Control:  Fair   Risk Assessment: Danger to Self:  No Self-injurious Behavior: No Danger to Others: No Duty to Warn:no Physical Aggression / Violence:No  Access to Firearms a concern: No  Gang Involvement:No   Subjective: Patient today reports anxiety and has managed "some things better since last appt, with more good days than bad days".  See Progress Note below.   Interventions: Solution-Oriented/Positive Psychology and Ego-Supportive  Diagnosis:   ICD-10-CM   1. Generalized anxiety disorder  F41.1      Plan of Care: Patient not signing treatment plan on computer screen due to Covid.  Treatment Goals: Goals remain on treatment plan as patient works on strategies to achieve her goals. Progress will be noted each week in "Progress" section of treatment plan.  Long term goal: Reduce overall level, frequency, and intensity of the anxiety so that daily functioning is not impaired.  Short term goal: Identify, challenge, and replace anxious/fearful/depressive thoughts and self-talk with more positive, realistic, and empowering thoughts and self-talk that do not support  anxiety nor depression.   Strategy: Review, repeat, and reinforce success.  Progress: Patient in today reporting anxiety "but am progressing". Quite upset today re: personal and family conflicts. Issues of anger, frustration, ineffective communication skills, manipulation, disappointment, hurt, and regret.' "Didn't write a much in my journal with so much family stuff going on." Needed session today to process a lot of frustration, hurt, and worries about family situations and tensions that increase patient's worrying. Worked with patient on seeing disadvantages of excessive worrying, and how she can choose to be "concerned" but not worried, and the difference between the two.  Also worked on self talk to make it more positive.  Using her short-term goal in treatment plan above we were able to work with some specific examples and helping patient interrupt anxious thoughts in order to replace them with more reality-based and empowering thoughts that can help her in situations at home and also in her extended family especially when she needs to set better limits and boundaries with others.  Patient continues to work on being a better listener and being able to pause when she is talking.  Has had a rough for a couple of weeks due to family troubles and seems to be reenergized some after session today, and has some more defined ways of managing and reacting to negativity within the family.  Is improving some and not having such rigid expectations of others and working to have more flexibility.  Encouraged patient to continue staying in touch with supportive people, practicing more active listening where she is  listening to really hear the other person and not just respond, continue working to be more self aware, stay in the present and focus on what she can control, work on decreasing her overthinking and overanalyzing, remaining involved in groups she enjoys such as [employer] and exercising and her church  choir, continue her efforts to cut back on smoking as she is making some progress, and feeling good and encouraging herself as she continues to work with her treatment goal plan on moving forward in a more positive direction.  Goal review and progress/challenges noted with patient.  Next appt within 3 weeks.   Shanon Ace, LCSW

## 2020-07-10 ENCOUNTER — Other Ambulatory Visit: Payer: Self-pay

## 2020-07-10 ENCOUNTER — Ambulatory Visit (INDEPENDENT_AMBULATORY_CARE_PROVIDER_SITE_OTHER): Payer: Medicare PPO | Admitting: Psychiatry

## 2020-07-10 DIAGNOSIS — F411 Generalized anxiety disorder: Secondary | ICD-10-CM | POA: Diagnosis not present

## 2020-07-10 NOTE — Progress Notes (Signed)
[facility] Counselor/Therapist Progress Note  Patient ID: Theresa Perez, MRN: [id number],    Date: 07/10/2020  Time Spent: 60 minutes  Treatment Type: Individual Therapy  Reported Symptoms: Anxiety, "worries"   Mental Status Exam:  Appearance:   Casual     Behavior:  Appropriate, Sharing and Motivated  Motor:  Normal  Speech/Language:   Clear and Coherent  Affect:  anxious  Mood:  anxious and depressed  Thought process:  goal directed  Thought content:    some ruminating  Sensory/Perceptual disturbances:    WNL  Orientation:  oriented to person, place, time/date, situation, day of week, month of year and year  Attention:  Good  Concentration:  Good and Fair  Memory:  [place] of knowledge:   Good  Insight:    Good  Judgment:   Good and Fair  Impulse Control:  Good and Fair   Risk Assessment: Danger to Self:  No Self-injurious Behavior: No Danger to Others: No Duty to Warn:no Physical Aggression / Violence:No  Access to Firearms a concern: No  Gang Involvement:No   Subjective:  Patient in today reporting anxiety and feels she is having "some better days, maybe 65% better days."Worries about family issues" and tends to sometimes assume the worst in situations.  Family conflicts discussed last session have decreased some since that time. Husband did get positive news re: his back situation and surgery has been postponed.  Issues with husband especially in their communication, where there's been efforts to make changes but "not consistently". Wants husband to seek her out and that doesn't always happen yet admits she can be irritable with him. Encouraged her to be more inviting with husband but it sounds like it's difficult for patient to approach him in positive ways, without resenting that he doesn't.  Stated earlier that husband feels she is negative towards him and patient adamently denies this. Worked with patient more on some communication skills re: listening  and talking, and feedback she gives husband. Discussed helpful verus unhelpful communication, emphasizing active listening as well as talking.  Also focused with patient on her excessive worrying and the disadvantages of that continuing behavior.  Discussed how she can be "concerned" rather than worried and how being concerned does not automatically assume the worst like her worrying does assume the worst, and supported this through her long and short-term goals and treatment plan below.  Also discussed how changing some of the problematic thought patterns can help patient be more empowered in her thinking and managing situations within relationships and within her home environment. Does feel she is progressing in her being more patient with husband "and maybe with others sometimes." Going to the gym is a positive for her, trying to decrease her worrying thoughts by interrupting them and replacing with more realistic thoughts, to practice listening more in conversations with people, along with reading helpful books and involvement in church groups.  Does tend to seem more upbeat and reenergized after session today and can hopefully benefit from some specific ways of communicating and interacting with her husband in less controlling ways and being more proactive and inviting in their relationship as well as other relationships within the family.  Interventions: Solution-Oriented/Positive Psychology, Ego-Supportive and Insight-Oriented  Diagnosis:   ICD-10-CM   1. Generalized anxiety disorder  F41.1       Treatment Goal Plan of Care: Patient not signing treatment plan on computer screen due to Covid. Treatment Goals: Goals remain on treatment plan  as patient works on strategies to achieve her goals. Progress will be noted each week in "Progress" section of treatment plan. Long term goal: Reduce overall level, frequency, and intensity of the anxiety so that daily functioning is not impaired. Short  term goal: Identify, challenge, and replace anxious/fearful/depressive thoughts and self-talk with more positive, realistic, and empowering thoughts and self-talk that do not support anxiety nor depression.  Strategy: Review, repeat, and reinforce success.   Progress / Plan:  Patient continues to show some improvement and encouraged to continue her work on being a better listener including pausing at times when she is talking.  Also working on being less controlling in relationship with husband.  Emphasized the communication piece again today with patient as this is one of the primary areas that can help her within her marriage and family.  Continues working on not having such rigid expectations of others and allowing more flexibility.  Regarding her own self-care, patient encouraged to stay in touch with supportive people, remain in the present and focus on what she can control or change, work on decreasing her overthinking and overanalyzing, practicing more active listening where she is listening to really hear the other person and not just to respond, working to be more self aware, remain involved in groups that she enjoys such as [employer] and exercise and church choir, cut back on smoking as she has made some progress, and encouraging herself and recognizing the strengths she shows and working with goal-directed behaviors and her efforts to move forward in a more positive direction and maintain her gains.  Goal review and progress/challenges noted with patient.  Next appointment within 2 to 3 weeks.   Shanon Ace, LCSW

## 2020-07-24 ENCOUNTER — Ambulatory Visit (INDEPENDENT_AMBULATORY_CARE_PROVIDER_SITE_OTHER): Payer: Medicare PPO | Admitting: Psychiatry

## 2020-07-24 ENCOUNTER — Other Ambulatory Visit: Payer: Self-pay

## 2020-07-24 DIAGNOSIS — F411 Generalized anxiety disorder: Secondary | ICD-10-CM

## 2020-07-24 NOTE — Progress Notes (Signed)
[facility] Counselor/Therapist Progress Note  Patient ID: MAALLE STARRETT, MRN: [id number],    Date: 07/24/2020  Time Spent: 60 minutes  Treatment Type: Individual Therapy  Reported Symptoms: anxiety, some "occasional depression"  Mental Status Exam:  Appearance:   Casual     Behavior:  Appropriate, Sharing and Motivated  Motor:  Normal  Speech/Language:   Clear and Coherent  Affect:  anxiety  Mood:  anxious  Thought process:  goal directed  Thought content:    some obsessiveness  Sensory/Perceptual disturbances:    WNL  Orientation:  oriented to person, place, time/date, situation, day of week, month of year and year  Attention:  Good  Concentration:  Good  Memory:  WNL  Fund of knowledge:   Good  Insight:    Good and Fair  Judgment:   Good  Impulse Control:  Fair   Risk Assessment: Danger to Self:  No Self-injurious Behavior: No Danger to Others: No Duty to Warn:no Physical Aggression / Violence:No  Access to Firearms a concern: No  Gang Involvement:No   Subjective:  Patient in today reporting she is having some ups and downs, some good days and some bad days and still having 65% better days.  Continued difficulties in relationship with adult grand-daughter "B", who continues to be very critical/judging of patient which tends to lead to further hurt and conflicts. Still sometimes assuming the worst case scenarios and we discussed how this tends to create more anxiety/fears.Still significant marital issues and she freely expresses her disatisfaction with husband not giving her much attention.  Also looked at "how she says what she says" and the criticalness can definitely be a factor. Patient shares that she complains to husband about how he doesn't spend enough time with her.  Husband has ordered a bowling ball as bowling was an activity she had felt and talked about being good for their relationship. Worked today more with her short and long term goals in tx plan  below, in efforts to decrease the intensity of her anxiety which affect patient individually and in relationships.  Working to try and help patient look for more positives as I feel like that is hurting her also in relationships when the negatives are highlighted.  Continue to work on Armed forces logistics/support/administrative officer including listening and talking and the way she provides feedback to her husband, per her report.  Discouraged excessive worrying and how that robs her of any peace in the present.  Also discussed how she can be "concerned" rather than worried which does not have the same negative result as worrying.  Going to the gym as a positive for patient.  See progress/plan below.   Interventions: Solution-Oriented/Positive Psychology, Ego-Supportive and Insight-Oriented  Diagnosis:   ICD-10-CM   1. Generalized anxiety disorder  F41.1     Treatment / Plan:  Patient not signing treatment plan on computer screen due to Covid. Treatment Goals: Goals remain on treatment plan as patient works on strategies to achieve her goals. Progress will be noted each week in "Progress" section of treatment plan. Long term goal: Reduce overall level, frequency, and intensity of the anxiety so that daily functioning is not impaired. Short term goal: Identify, challenge, and replace anxious/fearful/depressive thoughts and self-talk with more positive, realistic, and empowering thoughts and self-talk that do not support anxiety nor depression.  Strategy: Review, repeat, and reinforce success.    Progress / Plan: Patient today showing good participation in session especially in confronting some obstacles in her  relationship with her husband and her communication with him and other family.  Still working to interrupt anxious/worrisome thoughts and replace with more realistic and empowering thoughts.  Also encouraging her to work with her communication patterns within her more important relationships within her family and  with husband.  Key issues are "how she says what she says", and also improving listening skills, listening to really hear the other person before responding rather than listening only to respond quickly.  Encouraged patient to pay attention more to her communication with others in between sessions including homework assignment relating to that and other aspects of family relationships, along with reading books that she has found helpful.  Continues working to try and interact more with husband in less controlling ways and in being more proactive and inviting in her relationship with others within the family.  Working on being less rigid in relationships with some progress reported.  Encouraged patient to get outside daily and continue her physical activity, work on decreasing her rigid overthinking and overanalyzing, work on her Armed forces logistics/support/administrative officer as discussed today, consistently practice positive self-care and self talk, stay in touch with supportive people, stay in the present focusing on what she can control or change, practice active listening and the skill of listening to really hear the other person and not just to respond, remain involved in groups that brings her joy such as crafting and exercise and church groups, cut back further on smoking, focus on being more self aware, and to feel good about the effort and strength she shows in working on goal-directed behaviors in trying to move forward in a more positive direction in the midst of difficult circumstances.  Goal review and progress/challenges noted with patient.  Next appt within 3 weeks.  Shanon Ace, LCSW

## 2020-08-09 ENCOUNTER — Other Ambulatory Visit: Payer: Self-pay

## 2020-08-09 ENCOUNTER — Ambulatory Visit: Payer: Medicare PPO | Admitting: Psychiatry

## 2020-08-09 DIAGNOSIS — F411 Generalized anxiety disorder: Secondary | ICD-10-CM

## 2020-08-09 NOTE — Progress Notes (Signed)
[facility] Counselor/Therapist Progress Note  Patient ID: Theresa Perez, MRN: [id number],    Date: 08/09/2020  Time Spent: 50 minutes  Treatment Type: Individual Therapy  Reported Symptoms: Anxiety, frustrations  Mental Status Exam:  Appearance:   Casual and Neat     Behavior:  Appropriate, Sharing, and Motivated  Motor:  Normal  Speech/Language:   Clear and Coherent  Affect:  anxious  Mood:  anxious  Thought process:  goal directed  Thought content:    Some obsessiveness  Sensory/Perceptual disturbances:    WNL  Orientation:  oriented to person, place, time/date, situation, day of week, month of year, and year  Attention:  Good  Concentration:  Good and Fair  Memory:  WNL  Fund of knowledge:   Good and Fair  Insight:    Good and Fair  Judgment:   Good  Impulse Control:  Good and Fair   Risk Assessment: Danger to Self:  No Self-injurious Behavior: No Danger to Others: No Duty to Warn:no Physical Aggression / Violence:No  Access to Firearms a concern: No  Gang Involvement:No   Subjective:  She is in today reporting anxiety and  frustration as main symptoms and mostly related to personal and family situations. Progressing some as she reports almost having more good days than bad days. Finds yard work to be therapeutic and has been planting in her yard more than usual because she notices it helps her mood sometimes. Still having some difficult times with adult grand-daughter (B) who can be controlling and critical of patient. Trying to not assume worst case scenarios. Continue today in working  with some CBT and solution-focused strategies aimed at alleviating some of her anxiety and stress, and also help patient focus on some different communication strategies with husband and family. Patient tends to [facility] and has been told by husband that "she talks so much that he doesn't get a chance." She plans to intentionally not talk quite as much when they are together  over the next few days and see if husband speaks up more.  Also worked with patient more on "how she says what she says" and improving active listening skills whether it is in conversation with husband or others.  Patient does feel encouraged that he is showing more interest in their relationship and actually mentioned a couple of different activities for them to be involved in now that his back pain has subsided a good bit.  They are planning to be in a bowling league together in the fall and husband asked her about the pickle ball games that she is involved and as he may be interested in that also.  As far as her marital relationship, she obviously was feeling a little more up today and a little more optimistic.  Patient to continue working on better communication including listening and talking as we reviewed again today.  Encouraged patient to refrain from excessive worrying, understanding that it robs her of any peace in the present and does not do anything for her benefit.  Continues to go to the gym regularly which is a positive for her both physically, socially, and emotionally.   Interventions: Cognitive Behavioral Therapy and Solution-Oriented/Positive Psychology  Diagnosis:   ICD-10-CM   1. Generalized anxiety disorder  F41.1       Plan:  Patient today showing good motivation in working on goal-directed behaviors within her treatment goal plan as noted above.  She seemed to have a little more insight today and  worked well throughout the session especially focusing on some of the obstacles in her relationship with husband that have concerned her.  Feeling that she can try some things differently such as limiting her talk some so that husband has a chance to talk, and also encouraging follow-up in the things that husband has mentioned to her recently about them having some fun doing things together including bowling and pickle ball.  To continue working on her communication especially listening  and paying attention to how she says what she says".  Also to work on being less rigid in relationships.  Encouraged patient in some behaviors that have proven to be helpful to her before including decreasing her rigid overthinking and overanalyzing, get outside daily and walk or other physical activity, participate at the gym, work on Armed forces logistics/support/administrative officer as noted above, practice more consistently positive self-care and positive self talk, stay in the present focusing on what she can control, active listening to really hear the other person and what they are saying, remain involved in groups that bring her joy such as crafting and exercise and church groups, stay in touch with supportive people, cut back further on her smoking, focus on being more self aware especially in interactions with others, and to recognize the strength that she shows in working with goal-directed behaviors in the midst of difficult and challenging circumstances as she tries to move forward in a positive direction for better emotional health.  Goal review and progress/challenges noted with patient.  Next appointment within 2 to 3 weeks.   Shanon Ace, LCSW

## 2020-08-11 ENCOUNTER — Other Ambulatory Visit: Payer: Self-pay | Admitting: Internal Medicine

## 2020-08-11 DIAGNOSIS — Z1231 Encounter for screening mammogram for malignant neoplasm of breast: Secondary | ICD-10-CM

## 2020-08-23 ENCOUNTER — Ambulatory Visit: Payer: Medicare PPO | Admitting: Psychiatry

## 2020-08-23 ENCOUNTER — Other Ambulatory Visit: Payer: Self-pay

## 2020-08-23 DIAGNOSIS — F411 Generalized anxiety disorder: Secondary | ICD-10-CM | POA: Diagnosis not present

## 2020-08-23 NOTE — Progress Notes (Signed)
[facility] Counselor/Therapist Progress Note  Patient ID: Theresa Perez, MRN: [id number],    Date: 08/23/2020  Time Spent: 60 minutes   Treatment Type: Individual Therapy  Reported Symptoms:  anxiety ("some improvement"), frustration  Mental Status Exam:  Appearance:   Casual     Behavior:  Appropriate, Sharing, and Motivated  Motor:  Normal  Speech/Language:   Clear and Coherent  Affect:  anxious  Mood:  anxious  Thought process:  Some tangentiality  Thought content:    Obsessions  Sensory/Perceptual disturbances:    WNL  Orientation:  oriented to person, place, time/date, situation, day of week, month of year, year, and stated date of August 23, 2020  Attention:  Good  Concentration:  Fair  Memory:  [place] of knowledge:   Good  Insight:    Good and Fair  Judgment:   Good  Impulse Control:  Good and Fair   Risk Assessment: Danger to Self:  No Self-injurious Behavior: No Danger to Others: No Duty to Warn:no Physical Aggression / Violence:No  Access to Firearms a concern: No  Gang Involvement:No   Subjective: Patient in today reporting anxiety and frustration. Anxiety has reduced "a little bit".  Insight is increasing which is helping. Still working with obstacles in her marital relationship and recognizing some improvement, spending more time together, husband more sensitive to patient, husband interacting more verbally with patient and others when they stop by their home. Patient reports she is trying to not be so verbal and "give husband a chance to talk".  Participating with husband in a bowling league. States she is trying to look for the positives more, to be less rigid with other especially husband, to be a better listener, giving over less power to others "who try to tell me what to do". Is having "more good days" and remains committed to her goals. Not as negatively impacted by controlling adult grand-daughter. Getting better at not assuming the worst case  scenarios.  Going to the gym continues to be good for her physical and emotional health.  Continued work with Insight-Oriented and CBT strategies to alleviate some of her anxiety and help patient develop more helpful communication strategies with husband/family and others. Continued work on communication strategies especially active listening to really hear the other person and paying attention to "how I say what I say."  He is trying to refrain from second-guessing and excessive worrying as it robs her of any peace in the present and there is no real benefit.   Interventions: Cognitive Behavioral Therapy and Insight-Oriented  Diagnosis:   ICD-10-CM   1. Generalized anxiety disorder  F41.1        Treatment goal plan:  Patient not signing treatment plan on computer screen due to Covid. Treatment Goals: Goals remain on treatment plan as patient works on strategies to achieve her goals.  Progress will be noted each week in "Progress" section of treatment plan. Long term goal: Reduce overall level, frequency, and intensity of the anxiety so that daily functioning is not impaired. Short term goal: Identify, challenge, and replace anxious/fearful/depressive thoughts and self-talk with more positive, realistic, and empowering thoughts and self-talk that do not support anxiety nor depression. Strategy: Review, repeat, and reinforce success    Plan:   Patient today showing good motivation and some increased insight in working with goal-directed behaviors within her treatment plan as noted above.  Encouraged patient to continue with some behaviors that have proven to be helpful between  sessions previously including: Getting outside daily and walking, continue activity at the gym, work on communication skills as noted above, decrease her rigid overthinking and overanalyzing, practice more consistent positive self-care and self talk, active listening to really hear the other person and what they are  saying, remain involved in groups that bring her joy such as crafting and church groups, stay in touch with supportive people, cut back further on her smoking, stay in the present focusing on what she can control, focus on being more self aware especially in interactions with others, and to feel good about the strength she shows in working with goal-directed behaviors in the midst of difficult and challenging circumstances as she tries to move forward in a positive direction.   Goal review and progress/challenges noted with patient.  Next appointment within 2-3 weeks.   Shanon Ace, LCSW

## 2020-08-24 ENCOUNTER — Other Ambulatory Visit: Payer: Self-pay | Admitting: Internal Medicine

## 2020-08-27 ENCOUNTER — Other Ambulatory Visit: Payer: Self-pay | Admitting: Internal Medicine

## 2020-08-27 DIAGNOSIS — J069 Acute upper respiratory infection, unspecified: Secondary | ICD-10-CM

## 2020-09-04 ENCOUNTER — Other Ambulatory Visit: Payer: Self-pay | Admitting: Internal Medicine

## 2020-09-04 DIAGNOSIS — E890 Postprocedural hypothyroidism: Secondary | ICD-10-CM

## 2020-09-05 ENCOUNTER — Ambulatory Visit: Payer: Medicare PPO | Admitting: Psychiatry

## 2020-09-05 ENCOUNTER — Other Ambulatory Visit: Payer: Self-pay

## 2020-09-05 DIAGNOSIS — F411 Generalized anxiety disorder: Secondary | ICD-10-CM

## 2020-09-05 NOTE — Progress Notes (Signed)
[facility] Counselor/Therapist Progress Note  Patient ID: Theresa Perez, MRN: [id number],    Date: 09/05/2020  Time Spent: 58 minutes   Treatment Type: Individual Therapy  Reported Symptoms: anxiety, some stressors within family  Mental Status Exam:  Appearance:   Casual and Neat     Behavior:  Appropriate, Sharing, and Motivated  Motor:  Normal  Speech/Language:   Clear and Coherent and Normal Rate  Affect:  anxious  Mood:  anxious  Thought process:  goal directed  Thought content:    Some obsessiveness  Sensory/Perceptual disturbances:    WNL  Orientation:  oriented to person, place, time/date, situation, day of week, month of year, year, and stated date of July 19, 20222  Attention:  Good  Concentration:  Good  Memory:  WNL  Fund of knowledge:   Good  Insight:    Good and Fair  Judgment:   Good  Impulse Control:  Good   Risk Assessment: Danger to Self:  No Self-injurious Behavior: No Danger to Others: No Duty to Warn:no Physical Aggression / Violence:No  Access to Firearms a concern: No  Gang Involvement:No   Subjective:  Patient today reporting anxiety as her main symptom and is mostly related to personal and family.  Continued work with CBT strategies and targeting her anxiety.  Anxious/fearful/depressive thoughts and self-talk have persisted but not as strong. Worked more today on specific, more recent anxious thoughts, intercepting them and changing them to be more reality-based and encouraging/empowering, per treatment goal plan. This is getting some easier for patient, which is encouraging for her. Insight improving. Outlook improving gradually.  Feeling good that husband is becoming more actively involved in some activities they can participate in together and help strengthen their relationship.  Patient would like for that to be happening much quicker and I encouraged her to use patience and let things evolve gradually as they have been, even with small  bits of progress as that is a change for them.  Encouraging her to keep practicing some of the newer behaviors that she has begun and seems to be having a positive affect on both her and their marital relationship including: Her allowing time for husband to talk also and really listen to him, considering more of "how I say what I say", being less rigid in relationships, look for more positives and negatives in herself and others, reduce "letting others tell me what to do" and figuring out for herself what is best, and continue attending the gym as this is good for her physically and emotionally.  Notes that she is having more better days.  Encouraged her also to refrain from second-guessing so much and excessive worrying as it keeps her from feeling peace in the present moment and there is no positive payoff for the worrying nor second-guessing.    Interventions: Cognitive Behavioral Therapy and Insight-Oriented  Diagnosis:   ICD-10-CM   1. Generalized anxiety disorder  F41.1        Treatment goal plan:  Patient not signing treatment plan on computer screen due to Covid. Treatment Goals: Goals remain on treatment plan as patient works on strategies to achieve her goals.  Progress will be noted each week in "Progress" section of treatment plan. Long term goal: Reduce overall level, frequency, and intensity of the anxiety so that daily functioning is not impaired. Short term goal: Identify, challenge, and replace anxious/fearful/depressive thoughts and self-talk with more positive, realistic, and empowering thoughts and self-talk that do not  support anxiety nor depression. Strategy: Review, repeat, and reinforce success.    Plan:  Patient today showing motivation and continues to show gradual increased insight as she focuses on goal-directed behaviors in her treatment plan. Mood seemed more upbeat today.  Still struggling with anxiety but the intensity, she reports,  is some less. Noticing  some progress which is encouraging for her. Feels that husband is noticing improvements also. Some morning anxiety but it is lessening some as she works with her tx goals.  Encouraged patient to use some behaviors that have been helpful in the past between sessions including: Working on Armed forces logistics/support/administrative officer as noted above in "subject" section, continue her exercise at the gym, decrease her rigid overthinking and overanalyzing, practice active listening to really hear the other person and what they are saying before responding, getting outside daily and walking, practice consistent positive self-care and self talk, remain involved in groups that are healthy for her including crafting and church groups, stay in touch with supportive people, cut back more on her smoking, stay in the present focusing on what she can control, focus on being more self aware especially in interactions with others, and to recognize the strength she shows in working with goal-directed behaviors in the midst of challenging circumstances as she tries to move forward in a more positive direction of improved emotional health.  Goal review and progress/challenges noted with patient.  Next appointment within 2 to 3 weeks.   Shanon Ace, LCSW

## 2020-09-20 ENCOUNTER — Other Ambulatory Visit: Payer: Self-pay

## 2020-09-20 ENCOUNTER — Ambulatory Visit: Payer: Medicare PPO | Admitting: Psychiatry

## 2020-09-20 DIAGNOSIS — F411 Generalized anxiety disorder: Secondary | ICD-10-CM | POA: Diagnosis not present

## 2020-09-20 NOTE — Progress Notes (Signed)
[facility] Counselor/Therapist Progress Note  Patient ID: Theresa Perez, MRN: [id number],    Date: 09/20/2020  Time Spent: 52 minutes   Treatment Type: Individual Therapy  Reported Symptoms: anxiety, some "mild depression"  Mental Status Exam:  Appearance:   Well Groomed     Behavior:  Appropriate, Sharing, and Motivated  Motor:  Normal  Speech/Language:   Clear and Coherent  Affect:  anxious  Mood:  anxious  Thought process:  goal directed  Thought content:    Some obsessiveness  Sensory/Perceptual disturbances:    WNL  Orientation:  oriented to person, place, time/date, situation, day of week, month of year, year, and stated date of Aug. 3, 2022  Attention:  Fair  Concentration:  Fair  Memory:  [place] of knowledge:   Good  Insight:    Good and Fair  Judgment:   Good and Fair  Impulse Control:  Good and Fair   Risk Assessment: Danger to Self:  No Self-injurious Behavior: No Danger to Others: No Duty to Warn:no Physical Aggression / Violence:No  Access to Firearms a concern: No  Gang Involvement:No   Subjective:  Patient in today reporting anxiety and some "mild depression" related to personal and family situations. Followed up from last session and reports she has been working further with CBT strategies re: her anxious thoughts/mood. Focused on this in session today also. Reviewed treatment goal plan today and mutually agreed to work more today also her anxious self-talk and how to re-frame certain situations and change her self talk to be more self-encouraging and positive. Looked at her need to be more self-motivated to actually make changes in some of her behavior and follow through. Outlook improving. Some improvement in insight. Noticing some improvement in her relationship with husband which is into patient.  Strongly encouraged her to also keep working on "active listening skills" that we have worked on in previous sessions and also "how I say what I  say".  Emphasized the habit of looking for positives versus negatives and avoiding worst case scenario thinking.  Doing better in her follow through between sessions.   Interventions: Cognitive Behavioral Therapy and Insight-Oriented  Diagnosis:   ICD-10-CM   1. Generalized anxiety disorder  F41.1        Treatment goal plan:  Patient not signing treatment plan on computer screen due to Covid. Treatment Goals: Goals remain on treatment plan as patient works on strategies to achieve her goals.  Progress will be noted each week in "Progress" section of treatment plan. Long term goal: Reduce overall level, frequency, and intensity of the anxiety so that daily functioning is not impaired. Short term goal: Identify, challenge, and replace anxious/fearful/depressive thoughts and self-talk with more positive, realistic, and empowering thoughts and self-talk that do not support anxiety nor depression. Strategy: Review, repeat, and reinforce success.    Plan:  Patient today showing good motivation and engagement in session.  Showing more motivation and determination.  To work more intently on her follow through and in actually taking action on her goals more consistently.  Some improved insight is helping.  Anxiety is still an issue but the intensity is lowering.  States her husband's behavior has changed some for the positive and that he is noticing positives within her.  Patient encouraged to use some behaviors that have been helpful to her in the past including: Decreasing her rigid overthinking and overanalyzing, practicing active listening to really hear the other person and what they  are saying before she responds, working on Armed forces logistics/support/administrative officer, continue her exercise at the gym, practice positive self-care and self talk, getting outside daily, remain involved in groups that are healthy for her including church groups and crafting, stay in touch with supportive people, cut back more on her  smoking, stay in the present focusing on what she can control, become more self aware especially in interactions with others, and to feel good about the strength she is showing in working with goal-directed behaviors in the midst of challenging circumstances as she tries to move forward in a more positive direction of improved emotional health.  Goal review and progress/challenges noted with patient.  Next appt within 2-3 weeks.    Shanon Ace, LCSW

## 2020-10-04 ENCOUNTER — Ambulatory Visit
Admission: RE | Admit: 2020-10-04 | Discharge: 2020-10-04 | Disposition: A | Discharge status: Home / Self Care | Payer: Medicare PPO | Source: Ambulatory Visit | Attending: Internal Medicine | Admitting: Internal Medicine

## 2020-10-04 ENCOUNTER — Other Ambulatory Visit: Payer: Self-pay

## 2020-10-04 DIAGNOSIS — Z1231 Encounter for screening mammogram for malignant neoplasm of breast: Secondary | ICD-10-CM | POA: Diagnosis not present

## 2020-10-07 ENCOUNTER — Other Ambulatory Visit: Payer: Self-pay | Admitting: Internal Medicine

## 2020-10-11 ENCOUNTER — Other Ambulatory Visit: Payer: Self-pay | Admitting: Internal Medicine

## 2020-10-11 ENCOUNTER — Other Ambulatory Visit: Payer: Self-pay

## 2020-10-11 ENCOUNTER — Ambulatory Visit: Payer: Medicare PPO | Admitting: Psychiatry

## 2020-10-11 DIAGNOSIS — E890 Postprocedural hypothyroidism: Secondary | ICD-10-CM

## 2020-10-11 DIAGNOSIS — F411 Generalized anxiety disorder: Secondary | ICD-10-CM | POA: Diagnosis not present

## 2020-10-11 NOTE — Progress Notes (Signed)
[facility] Counselor/Therapist Progress Note  Patient ID: Theresa Perez, MRN: [id number],    Date: 10/11/2020  Time Spent: 57 minutes  Treatment Type: Individual Therapy  Reported Symptoms: anxiety "and making progress"  Mental Status Exam:  Appearance:   Casual and Neat     Behavior:  Appropriate, Sharing, and Motivated  Motor:  Normal  Speech/Language:   Clear and Coherent  Affect:  anxious  Mood:  anxious  Thought process:  goal directed  Thought content:    Some obsessiveness  Sensory/Perceptual disturbances:    WNL  Orientation:  oriented to person, place, time/date, situation, day of week, month of year, year, and stated date of Aug. 24, 2022  Attention:  Good  Concentration:  Good and Fair  Memory:  WNL  Fund of knowledge:   Good  Insight:    Good and Fair  Judgment:   Good  Impulse Control:  Good   Risk Assessment: Danger to Self:  No Self-injurious Behavior: No Danger to Others: No Duty to Warn:no Physical Aggression / Violence:No  Access to Firearms a concern: No  Gang Involvement:No   Subjective: Patient in today reporting anxiety related to personal, marital, and family issues. Depression has really lessened more. Problematic relationship with adult grand-daughter who is often controlling in her talking and relationship with patient, as they both have some struggle with control issues. Shares and processes ongoing concerns with her adult daughter and grand-daughter and discussed better limits patient can use with adult grand-daughter who tends to bully her often. Patient worked today on having better boundaries in relationships, setting appropriate limits, and spending more time with husband a in meaningful activities.  States there has been some progress in their communication and overall relationship which offers her some encouragement.  Looked at her tendency to have conversations "in her head " and not follow through on certain changes she is wanting  to make as her self talk interrupts her from taking action.  Overall outlook has improved some.  Encouraged patient to continue working on active listening skills that we have worked on in prior sessions as well as paying attention to "how I say what I say" which is very important in her communication especially with husband.  States that end of session she really wants to start her day with a devotional as she feels like that will get her off to a better start, and also wants to attend church with husband as she feels that would help both of them.    Interventions: Cognitive Behavioral Therapy, Solution-Oriented/Positive Psychology, and Ego-Supportive  Diagnosis:   ICD-10-CM   1. Generalized anxiety disorder  F41.1       Treatment goal plan:  Patient not signing treatment plan on computer screen due to Covid. Treatment Goals: Goals remain on treatment plan as patient works on strategies to achieve her goals.  Progress will be noted each week in "Progress" section of treatment plan. Long term goal: Reduce overall level, frequency, and intensity of the anxiety so that daily functioning is not impaired. Short term goal: Identify, challenge, and replace anxious/fearful/depressive thoughts and self-talk with more positive, realistic, and empowering thoughts and self-talk that do not support anxiety nor depression. Strategy: Review, repeat, and reinforce success.    Plan: Patient today showing motivation which did improve some during the session.  Worked well with both some personal and family related communication and behavioral issues as noted above.  Continues well with goal-directed behaviors and encouraged her to  practice some behaviors that have proven to be helpful previously including: Practicing the active listening skills to really hear the other person before she responds, paying attention to "how she says what she says" and the tone involved, decreasing her rigid overthinking and  overanalyzing, continue her exercise at the gym, practice positive self-care and self talk, getting outside daily, enjoying time with her dog, remain involved in groups that are healthy for her including church groups and crafting, stay in touch with supportive people, cut back more on her smoking, stay in the present focusing on what she can control, become more self aware especially in interactions with others, have healthy boundaries with others as needed, and feel good about the strength she shows in working with goal-directed behaviors in the midst of challenging circumstances as she tries to move forward in a positive direction of improved emotional health.  Goal review and progress/challenges noted with patient.  Next appointment within 2 to 3 weeks.   Shanon Ace, LCSW

## 2020-10-17 ENCOUNTER — Other Ambulatory Visit: Payer: Self-pay | Admitting: Internal Medicine

## 2020-10-24 ENCOUNTER — Other Ambulatory Visit: Payer: Self-pay

## 2020-10-24 ENCOUNTER — Ambulatory Visit: Payer: Medicare PPO | Admitting: Psychiatry

## 2020-10-24 DIAGNOSIS — F411 Generalized anxiety disorder: Secondary | ICD-10-CM

## 2020-10-24 NOTE — Progress Notes (Signed)
[facility] Counselor/Therapist Progress Note  Patient ID: Theresa Perez, MRN: [id number],    Date: 10/24/2020  Time Spent: 50 minutes   Treatment Type: Individual Therapy  Reported Symptoms: anxiety, frustration  Mental Status Exam:  Appearance:   Casual and Neat     Behavior:  Appropriate, Sharing, and Motivated  Motor:  Normal  Speech/Language:   Clear and Coherent and Normal Rate  Affect:  anxious  Mood:  anxious  Thought process:  goal directed  Thought content:    Some obsessiveness  Sensory/Perceptual disturbances:    WNL  Orientation:  oriented to person, place, time/date, situation, day of week, month of year, year, and stated date of Sept. 6, 2022  Attention:  Fair  Concentration:  Good and Fair  Memory:  [place] of knowledge:   Good  Insight:    Good and Fair  Judgment:   Good  Impulse Control:  Good   Risk Assessment: Danger to Self:  No Self-injurious Behavior: No Danger to Others: No Duty to Warn:no Physical Aggression / Violence:No  Access to Firearms a concern: No  Gang Involvement:No   Subjective:  Patient in today reporting anxiety and frustration as her main symptoms. Frustration relating mostly to relationship with husband and sometimes within extended family. Some hurt/conflict within family that she thought previously was getting better, seemed to "go downhill some." Processed multiple situations that have occurred recently between she and husband and on some occasions involving adult grand-daughter who is often "pushy" with patient. Depression has increased some more recently, after having lessened within past month, and patient feels the recent increase is more situational. Relationship challenges with adult grand-daughter continues to be a challenge as patient feels pushed by grand-daughter to "look at things her way, versus my own way."  Control issues are stressful for patient but continues to work on them. Boundaries in relationships and  communication in relationships reviewed and practice more in session today, which seemed real helpful to patient. Felt she had made some progress but easy to be critical with husband, especially. Learning it's on to be light-hearted and playful. Trying to be more positive with husband versus critical "wanting to change him" and worked more on this in session today. Also needed to review "active" listening skills and "paying attention to how I say what I say". Didn't get started in beginning using a devotional each morning as she had stated last session it would help. Plans to definitely follow through between now and next session.   Interventions: Ego-Supportive and Insight-Oriented   Diagnosis:   ICD-10-CM   1. Generalized anxiety disorder  F41.1       Treatment goal plan:  Patient not signing treatment plan on computer screen due to Covid. Treatment Goals: Goals remain on treatment plan as patient works on strategies to achieve her goals.  Progress will be noted each week in "Progress" section of treatment plan. Long term goal: Reduce overall level, frequency, and intensity of the anxiety so that daily functioning is not impaired. Short term goal: Identify, challenge, and replace anxious/fearful/depressive thoughts and self-talk with more positive, realistic, and empowering thoughts and self-talk that do not support anxiety nor depression. Strategy: Review, repeat, and reinforce success.   Plan:  Patient today showing marginal motivation but as session progressed she showed increased motivation and worked well in session on personal and family conflict, communication issues, and "not assuming the worst"/looking for the negatives more than positives.  Had a little more  trouble with follow through these past couple weeks since last session but seems to be renewed in her motivation and pushing forward.  Encouraged the practice of positive behaviors including: Paying attention to how she says  what she says, practicing the active listening skills to really hear the other person speaking before she responds, paying attention to the tone of voice she uses, decreasing her rigid overthinking and over analyzing, getting outside daily and walking, continue her exercise at the gym, practice positive self-care and positive self talk consistently, enjoying time with her dog, remaining involved in groups that are healthy for her including church groups and crafting, staying in touch with supportive people, staying in the present focusing on what she can control, cutting back more on her smoking, becoming more self aware especially in interactions with others, healthy boundaries with others as needed, and to recognize the strength that she is showing in working with goal-directed behaviors while in challenging circumstances as she tries to move forward in a more positive direction of improved emotional health.  Goal review and progress/challenges noted with patient.  Next appointment within 3 weeks.   Shanon Ace, LCSW

## 2020-10-30 ENCOUNTER — Encounter: Payer: Self-pay | Admitting: Internal Medicine

## 2020-10-30 DIAGNOSIS — R413 Other amnesia: Secondary | ICD-10-CM

## 2020-11-09 ENCOUNTER — Encounter: Payer: Self-pay | Admitting: Nurse Practitioner

## 2020-11-09 ENCOUNTER — Other Ambulatory Visit: Payer: Self-pay

## 2020-11-09 ENCOUNTER — Ambulatory Visit: Payer: Medicare PPO | Admitting: Nurse Practitioner

## 2020-11-09 VITALS — BP 130/78 | HR 68 | Temp 98.5°F | Ht 71.0 in | Wt 174.6 lb

## 2020-11-09 DIAGNOSIS — R21 Rash and other nonspecific skin eruption: Secondary | ICD-10-CM

## 2020-11-09 MED ORDER — CEPHALEXIN 500 MG PO CAPS: 500.0000 mg | ORAL_CAPSULE | Freq: Three times a day (TID) | ORAL | 0 refills | Status: DC

## 2020-11-09 MED ORDER — TRIAMCINOLONE ACETONIDE 0.1 % EX CREA: 1.0000 "application " | TOPICAL_CREAM | Freq: Two times a day (BID) | CUTANEOUS | 1 refills | Status: DC

## 2020-11-09 MED ORDER — PREDNISONE 20 MG PO TABS: 20.0000 mg | ORAL_TABLET | Freq: Every day | ORAL | 0 refills | Status: DC

## 2020-11-09 NOTE — Patient Instructions (Signed)
Cephalexin Capsules or Tablets What is this medication? CEPHALEXIN (sef a LEX in) treats infections caused by bacteria. It belongs to a group of medications called cephalosporin antibiotics. It will not treat colds, the flu, or infections caused by viruses. This medicine may be used for other purposes; ask your health care provider or pharmacist if you have questions. COMMON BRAND NAME(S): Biocef, Daxbia, Keflex, Keftab What should I tell my care team before I take this medication? They need to know if you have any of these conditions: Bleeding disorder Kidney disease Liver disease Seizures Stomach or intestine problems like colitis An unusual or allergic reaction to cephalexin, other penicillin or cephalosporin antibiotics, other medications, foods, dyes, or preservatives Pregnant or trying to get pregnant Breast-feeding How should I use this medication? Take this medication by mouth. Take it as directed on the prescription label at the same time every day. You can take it with or without food. If it upsets your stomach, take it with food. Take all of this medication unless your care team tells you to stop it early. Keep taking it even if you think you are better. Talk to your care team about the use of this medication in children. While it may be prescribed for selected conditions, precautions do apply. Overdosage: If you think you have taken too much of this medicine contact a poison control center or emergency room at once. NOTE: This medicine is only for you. Do not share this medicine with others. What if I miss a dose? If you miss a dose, take it as soon as you can. If it is almost time for your next dose, take only that dose. Do not take double or extra doses. What may interact with this medication? Probenecid Some other antibiotics This list may not describe all possible interactions. Give your health care provider a list of all the medicines, herbs, non-prescription drugs, or  dietary supplements you use. Also tell them if you smoke, drink alcohol, or use illegal drugs. Some items may interact with your medicine. What should I watch for while using this medication? Tell your care team if your symptoms do not start to get better or if they get worse. Do not treat diarrhea with over the counter products. Contact your care team if you have diarrhea that lasts more than 2 days or if it is severe and watery. This medication may cause serious skin reactions. They can happen weeks to months after starting the medication. Contact your care team right away if you notice fevers or flu-like symptoms with a rash. The rash may be red or purple and then turn into blisters or peeling of the skin. Or, you might notice a red rash with swelling of the face, lips or lymph nodes in your neck or under your arms. If you have diabetes, you may get a false-positive result for sugar in your urine. Check with your care team. What side effects may I notice from receiving this medication? Side effects that you should report to your care team as soon as possible: Allergic reactions-skin rash, itching, hives, swelling of the face, lips, tongue, or throat Redness, blistering, peeling, or loosening of the skin, including inside the mouth Severe diarrhea, fever Unusual vaginal discharge, itching, or odor Side effects that usually do not require medical attention (report to your care team if they continue or are bothersome): Diarrhea Headache Nausea This list may not describe all possible side effects. Call your doctor for medical advice about side effects. You may  report side effects to FDA at 1-800-FDA-1088. Where should I keep my medication? Keep out of the reach of children and pets. Store at room temperature between 20 and 25 degrees C (68 and 77 degrees F). Throw away any unused medication after the expiration date. NOTE: This sheet is a summary. It may not cover all possible information. If you  have questions about this medicine, talk to your doctor, pharmacist, or health care provider.  2022 Elsevier/Gold Standard (2019-12-29 12:12:58)

## 2020-11-09 NOTE — Progress Notes (Signed)
Subjective:  Patient ID: Theresa Perez, female    DOB: [birth year]  Age: 70 y.o. MRN: [id number]  CC:  Chief Complaint  Patient presents with   Foot Swelling    Pt states on Tues she missed and step on ant hill, and they bit her (L) foot. Her (L) foot is red now and swollen w/ bumps       HPI  This patient arrives today for the above.  Approximately 2 days ago she was out in her yard with a plumbing and Garment/textile technologist and stepped on an ant hill.  She did not realize she was being bit but later on noticed bumps on her left foot.  She went back out and found that where she was standing was an ant hill and thinks she was bit by BMs.  Since then she has been applying triamcinolone cream that she has leftover from a previous prescription.  She tells me she thinks it may be expired.  Generally this is been helping with the itching, however the surrounding skin is starting to get red and the redness seems to be going up her leg.  The bites also are starting to come to ahead with what looks like pus.  She denies significant pain and stated above itching is better.  She denies any fever.  Past Medical History:  Diagnosis Date   Allergy    Anxiety    Cancer ([place])    Thyroid-Papillary   Complication of anesthesia    Cyst of breast, right, diffuse fibrocystic    Hypertension    Hypothyroidism    PONV (postoperative nausea and vomiting)       Family History  Problem Relation Age of Onset   Diabetes Mother    Deep vein thrombosis Mother        Varicose Veins   Cancer Father        Father died from brain tumor.   Heart disease Father    Colon cancer Neg Hx    Stomach cancer Neg Hx     Social History   Social History Narrative   Not on file   Social History   Tobacco Use   Smoking status: Every Day    Packs/day: 0.30    Years: 15.00    Pack years: 4.50    Types: Cigarettes   Smokeless tobacco: Never   Tobacco comments:    Occassional smoker  Substance Use  Topics   Alcohol use: Yes    Comment: Rarely     Current Meds  Medication Sig   Ascorbic Acid (VITAMIN C PO) Take 1 tablet by mouth daily.   atenolol-chlorthalidone (TENORETIC) 50-25 MG tablet Take 1/2 (one-half) tablet by mouth once daily   Black Cohosh 540 MG CAPS Take 540 mg by mouth 2 (two) times daily.    buPROPion (WELLBUTRIN) 75 MG tablet Take 1 tablet by mouth once daily   CALCIUM PO Take 1 tablet by mouth daily.   cephALEXin (KEFLEX) 500 MG capsule Take 1 capsule (500 mg total) by mouth 3 (three) times daily.   COENZYME Q-10 PO Take 1 tablet by mouth daily.   Cyanocobalamin (VITAMIN B-12 PO) Take 1 tablet by mouth daily.   DULoxetine (CYMBALTA) 60 MG capsule Take 1 capsule by mouth twice daily   ECHINACEA EXTRACT PO Take by mouth.   EUTHYROX 100 MCG tablet Take 1 tablet by mouth once daily   EVENING PRIMROSE OIL PO Take by mouth.   ferrous sulfate  325 (65 FE) MG tablet Take 325 mg by mouth every evening.    fluticasone (FLONASE) 50 MCG/ACT nasal spray Use 2 spray(s) in each nostril once daily   Ginkgo Biloba (GNP GINGKO BILOBA EXTRACT PO) Take 1 tablet by mouth daily.   LORazepam (ATIVAN) 0.5 MG tablet TAKE 1 TABLET BY MOUTH ONCE DAILY AS NEEDED FOR ANXIETY   Misc Natural Products (GLUCOSAMINE CHOND COMPLEX/MSM) TABS Take 1 tablet by mouth 2 (two) times daily.   Multiple Vitamins-Minerals (MULTIVITAMIN WITH MINERALS) tablet Take 1 tablet by mouth daily.   Nutritional Supplements (DHEA PO) Take 1 tablet by mouth daily.   predniSONE (DELTASONE) 20 MG tablet Take 1 tablet (20 mg total) by mouth daily with breakfast.   triamcinolone cream (KENALOG) 0.1 % Apply 1 application topically 2 (two) times daily.   Turmeric (QC TUMERIC COMPLEX PO) Take by mouth.    ROS:  See HPI   Objective:   Today's Vitals: BP 130/78 (BP Location: Left Arm)   Pulse 68   Temp 98.5 F (36.9 C) (Oral)   Ht 5\' 11"  (1.803 m)   Wt 174 lb 9.6 oz (79.2 kg)   SpO2 95%   BMI 24.35 kg/m  Vitals with  BMI 11/09/2020 05/08/2020 02/14/2020  Height 5\' 11"  5\' 11"  5\' 11"   Weight 174 lbs 10 oz 168 lbs 3 oz 173 lbs  BMI 24.36 93.81 82.99  Systolic 371 696 -  Diastolic 78 78 -  Pulse 68 67 -     Physical Exam Vitals reviewed.  Constitutional:      General: She is not in acute distress.    Appearance: Normal appearance.  HENT:     Head: Normocephalic and atraumatic.  Neck:     Vascular: No carotid bruit.  Cardiovascular:     Rate and Rhythm: Normal rate and regular rhythm.     Pulses: Normal pulses.          Dorsalis pedis pulses are 2+ on the left side.     Heart sounds: Normal heart sounds.  Pulmonary:     Effort: Pulmonary effort is normal.     Breath sounds: Normal breath sounds.  Skin:    General: Skin is warm and dry.          Comments: Red area on diagram indicates location of bites.  She has multiple vesicular lesions.  Surrounding skin is red, is swollen, is warm to touch, you can see where the redness seems to be extending over where the bite stop.  Neurological:     General: No focal deficit present.     Mental Status: She is alert and oriented to person, place, and time.  Psychiatric:        Mood and Affect: Mood normal.        Behavior: Behavior normal.        Judgment: Judgment normal.         Assessment and Plan   1. Rash      Plan: 1.  I am concerned she may be developing cellulitis on top of the bug bites.  We will prescribe triamcinolone cream so she has some cream that not expired that she can apply twice a day as needed for itching.  We will also start her on a course of Keflex.  She did ask for prednisone and I recommend she use triamcinolone and Keflex alone as I think these will help to control her symptoms.  However because we are going into the weekend if  itching gets worse despite use of triamcinolone I will prescribe a short course of steroids that she can take by mouth.  We did discuss common side effects and when to stop the medication if she  were to experience palpitations or abdominal pain.  She was told if signs or symptoms of infection worsen over the weekend to go to either an urgent care or the emergency department.  We did discuss that this includes worsening redness, swelling, tenderness, warmth, or if she spikes a fever.  She tells me she understands.  We also discussed using Epsom salt soaks and/or warm compresses as well. She endorses her understanding.   Tests ordered No orders of the defined types were placed in this encounter.     Meds ordered this encounter  Medications   predniSONE (DELTASONE) 20 MG tablet    Sig: Take 1 tablet (20 mg total) by mouth daily with breakfast.    Dispense:  10 tablet    Refill:  0    Order Specific Question:   Supervising Provider    Answer:   Binnie Rail [[id number]]   triamcinolone cream (KENALOG) 0.1 %    Sig: Apply 1 application topically 2 (two) times daily.    Dispense:  30 g    Refill:  1    Order Specific Question:   Supervising Provider    Answer:   BURNS, Claudina Lick [[id number]]   cephALEXin (KEFLEX) 500 MG capsule    Sig: Take 1 capsule (500 mg total) by mouth 3 (three) times daily.    Dispense:  15 capsule    Refill:  0    Order Specific Question:   Supervising Provider    Answer:   Binnie Rail [[id number]]    Patient to follow-up in the next 2-3 months for routine follow-up with her PCP or sooner as needed.  Ailene Ards, NP

## 2020-11-10 ENCOUNTER — Encounter: Payer: Self-pay | Admitting: Physician Assistant

## 2020-11-15 ENCOUNTER — Other Ambulatory Visit: Payer: Self-pay

## 2020-11-15 ENCOUNTER — Ambulatory Visit: Payer: Medicare PPO | Admitting: Psychiatry

## 2020-11-15 DIAGNOSIS — F411 Generalized anxiety disorder: Secondary | ICD-10-CM | POA: Diagnosis not present

## 2020-11-15 NOTE — Progress Notes (Signed)
[facility] Counselor/Therapist Progress Note  Patient ID: Theresa Perez, MRN: [id number],    Date: 11/15/2020  Time Spent: 55 minutes   Treatment Type: Individual Therapy  Reported Symptoms: anxiety, frustration, sadness, "feels depression has decreased"  Mental Status Exam:  Appearance:   Casual     Behavior:  Appropriate, Sharing, and Motivated  Motor:  Normal  Speech/Language:   Clear and Coherent  Affect:  Depressed and anxious  Mood:  anxious, depressed, and sad  Thought process:  goal directed  Thought content:    Obsessions and overthinking  Sensory/Perceptual disturbances:    WNL  Orientation:  oriented to person, place, time/date, situation, day of week, month of year, year, and stated date of Sept. 28, 2022  Attention:  Good  Concentration:  Good and Fair  Memory:  [place] of knowledge:   Good  Insight:    Good and Fair  Judgment:   Good  Impulse Control:  Good   Risk Assessment: Danger to Self:  No Self-injurious Behavior: No Danger to Others: No Duty to Warn:no Physical Aggression / Violence:No  Access to Firearms a concern: No  Gang Involvement:No    Subjective:  Patient in today reporting anxiety, frustration, and some sadness (re: marital issues), but adds "I am some more hopeful also in the work we are doing and seeing some improvement within herself. Improvements she notices is feeling more grounded at times "when I calm myself, try to worry less, and pray more", not being as frequently critical with her husband, trying to overthink less, following up on self-care. Frustrated with "things I often cannot change", which reflects improved insight for patient.  Focusing on looking more at what I can control or change versus cannot. Processed some of her continued work on herself "within my marriage" and what she can do to positively impact that relationship. Has discovered that "music lifts me up" and has started listening to music more and also  singing more.  "Recognizing when I don't have control over certain things, is helping some with my frustrations."  Marital relationship "has its good times and also it's challenging times but I think is it improving gradually." Recognizing her need to continue not being critical Still working with some control issues, boundaries, and communication skills (especially active listening and paying attention to how I say what I say."."Starting my day with a devotional really helps me."  Encouraged to "allow herself "to be playful and have fun.  Interventions: Ego-Supportive and Insight-Oriented  Diagnosis:   ICD-10-CM   1. Generalized anxiety disorder  F41.1      Treatment goal plan:  Patient not signing treatment plan on computer screen due to Covid. Treatment Goals: Goals remain on treatment plan as patient works on strategies to achieve her goals.  Progress will be noted each week in "Progress" section of treatment plan. Long term goal: Reduce overall level, frequency, and intensity of the anxiety so that daily functioning is not impaired. Short term goal: Identify, challenge, and replace anxious/fearful/depressive thoughts and self-talk with more positive, realistic, and empowering thoughts and self-talk that do not support anxiety nor depression. Strategy: Review, repeat, and reinforce success.    Plan:  Patient today showing improved motivation since last session, and also increased participation in session. Worked on her anxiety related to personal, marital, and family situations; and sadness related to issues within her marriage and able to realize some progress and not focus only on the "negatives that haven't changed  yet" as noted above. Encouraged her in practicing positive behaviors including: Decreasing her rigid overthinking and over analyzing, paying attention to how she says what she says, practicing the active listening skills to really hear the other person speaking before she  responds, paying attention to her tone of voice, getting outside daily and walking, continue her exercise at the gym, consistent positive self talk, enjoying time with her dog, remaining involved in groups that are healthy for her including church groups and crafting, staying in touch with supportive people, remaining in the present focusing on what she can control or change, cutting back more on her smoking, becoming more self aware especially in interactions with others, healthy boundaries with others as needed, and to realize the strength she is showing as she works with goal-directed behaviors to move forward in a direction that supports improved emotional health and wellbeing.  Goal review and progress/challenges noted with patient.  Next appointment within 3 weeks.  This record has been created using [employer].  Chart creation errors have been sought, but may not always have been located and corrected.  Such creation errors do not reflect on the standard of medical care provided.    Shanon Ace, LCSW

## 2020-11-20 ENCOUNTER — Telehealth: Payer: Self-pay | Admitting: Internal Medicine

## 2020-11-20 NOTE — Telephone Encounter (Signed)
Left message for patient to call me back at [phone] to schedule Medicare Annual Wellness Visit   Last AWV  08/16/19  Please schedule at anytime with [facility] if patient calls the office back.    40 Minutes appointment   Any questions, please call me at [phone]

## 2020-11-29 ENCOUNTER — Ambulatory Visit: Payer: Medicare PPO | Admitting: Psychiatry

## 2020-11-29 ENCOUNTER — Other Ambulatory Visit: Payer: Self-pay

## 2020-11-29 DIAGNOSIS — F411 Generalized anxiety disorder: Secondary | ICD-10-CM | POA: Diagnosis not present

## 2020-11-29 NOTE — Progress Notes (Signed)
[facility] Counselor/Therapist Progress Note  Patient ID: Theresa Perez, MRN: [id number],    Date: 11/29/2020  Time Spent:  50 minutes  Treatment Type: Individual Therapy  Reported Symptoms: anxiety  Mental Status Exam:  Appearance:   Casual     Behavior:  Appropriate, Sharing, and Motivated  Motor:  Normal  Speech/Language:   Clear and Coherent  Affect:  anxious  Mood:  anxious  Thought process:  goal directed  Thought content:    Obsessions  Sensory/Perceptual disturbances:    WNL  Orientation:  oriented to person, place, time/date, situation, day of week, month of year, year, and stated date of Oct. 12, 2022  Attention:  Good  Concentration:  Good  Memory:  WNL  Fund of knowledge:   Good  Insight:    Good and Fair  Judgment:   Good  Impulse Control:  Good   Risk Assessment: Danger to Self:  No Self-injurious Behavior: No Danger to Others: No Duty to Warn:no Physical Aggression / Violence:No  Access to Firearms a concern: No  Gang Involvement:No   Subjective:  Patient in today reporting anxiety as main symptom. Anxiety currently most related to marital relationship, and not having many friends.  Is involved with her church and knows a lot of people there, "but not necessarily feeling close to them." A couple friends that she's had in the past, are experiencing more health issues which has limited their contacts. Sadness shared and processed some re: friends that are experiencing difficulties. Looked at and discussed her difficulty in "letting go in order to move forward" and still feeling she is making some gradual improvement in her control issues, especially with her husband.  Encouraged more consistent listening and her communication with husband as there is a tendency to sometimes over talk.  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  Mentions several other ways that she could focus more on meeting people including in the  area where she lives, and following up in relationships where she has not been touched more recently, and encouraged in those encounters as well to practice good listening as well as participating verbally.  Continues to say that she feels more grounded than she used to but would like to feel that way more of the time and we looked at things that tend to contribute to her feeling grounded, which patient responds is usually "when I calm myself down, try not to focus on overthinking and worrying, and praying more ".  Struggles with things that she cannot control or change which we spoke about a little more today before session ended and she is to pay more attention to this and work on letting go of the struggle and except the reality that there are things that we all have that we cannot control or change, and for her acceptance is definitely needed.   Interventions: Solution-Oriented/Positive Psychology, Ego-Supportive, and Insight-Oriented  Diagnosis:   ICD-10-CM   1. Generalized anxiety disorder  F41.1      Treatment goal plan:  Patient not signing treatment plan on computer screen due to Covid. Treatment Goals: Goals remain on treatment plan as patient works on strategies to achieve her goals.  Progress will be noted each week in "Progress" section of treatment plan. Long term goal: Reduce overall level, frequency, and intensity of the anxiety so that daily functioning is not impaired. Short term goal: Identify, challenge, and replace anxious/fearful/depressive thoughts and self-talk with more positive, realistic, and  empowering thoughts and self-talk that do not support anxiety nor depression. Strategy: Review, repeat, and reinforce success.    Plan:  Patient today showing good motivation and good participation in session.  She worked well in addressing her anxiety that is related to her marital relationship, as well as not having many friends do in some cases to friends having significant  health issues that affect their ability to interact on a regular basis.  As noted above we looked at ways that she might meet more people and form friendships as well as continue the work of deepening her relationship with her husband as she has seen some gradual changes in recent weeks in a positive direction.  It feels like slow change to her and she was encouraged to tolerate it feeling slow, because it feels real on the part of her husband.  Encouraged patient also in practicing positive behaviors in between sessions including: Practicing the active listening skills to really hear the other person speaking before she responds, decrease her rigid overthinking and over analyzing, having more quiet time for reflection and prayer, paying attention to how she says what she says, getting outside daily and walking, continue her exercise at the gym, consistent positive self talk, enjoying time with her dog, remaining involved in groups that are healthy for her including church groups and crafting, staying in touch with supportive people, remaining in the present focusing on what she can control or change, cutting back more on her smoking, becoming more self aware especially in the interactions with others, healthy boundaries with others as needed, and feel good about the strength she shows as she works with goal-directed behaviors to move in a direction that supports overall wellbeing and improved emotional health.  Goal review and progress/challenges noted with patient.  Next appointment within 2 to 3 weeks.  This record has been created using [employer].  Chart creation errors have been sought, but may not always have been located and corrected.  Such creation errors do not reflect on the standard of medical care provided.    Shanon Ace, LCSW

## 2020-12-04 ENCOUNTER — Ambulatory Visit: Payer: Medicare PPO | Admitting: Physician Assistant

## 2020-12-07 ENCOUNTER — Other Ambulatory Visit: Payer: Self-pay | Admitting: Internal Medicine

## 2020-12-10 ENCOUNTER — Other Ambulatory Visit: Payer: Self-pay | Admitting: Internal Medicine

## 2020-12-10 DIAGNOSIS — J069 Acute upper respiratory infection, unspecified: Secondary | ICD-10-CM

## 2020-12-11 ENCOUNTER — Ambulatory Visit: Payer: Medicare PPO | Admitting: Internal Medicine

## 2020-12-11 ENCOUNTER — Encounter: Payer: Self-pay | Admitting: Physician Assistant

## 2020-12-11 ENCOUNTER — Other Ambulatory Visit: Payer: Self-pay

## 2020-12-11 ENCOUNTER — Encounter: Payer: Self-pay | Admitting: Internal Medicine

## 2020-12-11 ENCOUNTER — Ambulatory Visit: Payer: Medicare PPO | Admitting: Physician Assistant

## 2020-12-11 VITALS — BP 134/78 | HR 77 | Temp 98.1°F | Ht 71.0 in | Wt 173.0 lb

## 2020-12-11 VITALS — BP 138/82 | HR 85 | Resp 18 | Ht 70.5 in | Wt 173.0 lb

## 2020-12-11 DIAGNOSIS — F4323 Adjustment disorder with mixed anxiety and depressed mood: Secondary | ICD-10-CM

## 2020-12-11 DIAGNOSIS — R197 Diarrhea, unspecified: Secondary | ICD-10-CM | POA: Insufficient documentation

## 2020-12-11 DIAGNOSIS — R413 Other amnesia: Secondary | ICD-10-CM | POA: Diagnosis not present

## 2020-12-11 DIAGNOSIS — R058 Other specified cough: Secondary | ICD-10-CM | POA: Insufficient documentation

## 2020-12-11 DIAGNOSIS — J410 Simple chronic bronchitis: Secondary | ICD-10-CM | POA: Diagnosis not present

## 2020-12-11 MED ORDER — ALBUTEROL SULFATE HFA 108 (90 BASE) MCG/ACT IN AERS: 2.0000 | INHALATION_SPRAY | Freq: Four times a day (QID) | RESPIRATORY_TRACT | 0 refills | Status: DC | PRN

## 2020-12-11 MED ORDER — PROMETHAZINE-DM 6.25-15 MG/5ML PO SYRP: 5.0000 mL | ORAL_SOLUTION | Freq: Four times a day (QID) | ORAL | 0 refills | Status: DC | PRN

## 2020-12-11 NOTE — Patient Instructions (Signed)
It was a pleasure to see you today at our office.   Recommendations:  Follow up as needed Referral to Psychiatry for medications Continue Counseling   RECOMMENDATIONS FOR ALL PATIENTS WITH MEMORY PROBLEMS: 1. Continue to exercise (Recommend 30 minutes of walking everyday, or 3 hours every week) 2. Increase social interactions - continue going to [place] and enjoy social gatherings with friends and family 3. Eat healthy, avoid fried foods and eat more fruits and vegetables 4. Maintain adequate blood pressure, blood sugar, and blood cholesterol level. Reducing the risk of stroke and cardiovascular disease also helps promoting better memory. 5. Avoid stressful situations. Live a simple life and avoid aggravations. Organize your time and prepare for the next day in anticipation. 6. Sleep well, avoid any interruptions of sleep and avoid any distractions in the bedroom that may interfere with adequate sleep quality 7. Avoid sugar, avoid sweets as there is a strong link between excessive sugar intake, diabetes, and cognitive impairment We discussed the Mediterranean diet, which has been shown to help patients reduce the risk of progressive memory disorders and reduces cardiovascular risk. This includes eating fish, eat fruits and green leafy vegetables, nuts like almonds and hazelnuts, walnuts, and also use olive oil. Avoid fast foods and fried foods as much as possible. Avoid sweets and sugar as sugar use has been linked to worsening of memory function.  There is always a concern of gradual progression of memory problems. If this is the case, then we may need to adjust level of care according to patient needs. Support, both to the patient and caregiver, should then be put into place.      FALL PRECAUTIONS: Be cautious when walking. Scan the area for obstacles that may increase the risk of trips and falls. When getting up in the mornings, sit up at the edge of the bed for a few minutes before getting  out of bed. Consider elevating the bed at the head end to avoid drop of blood pressure when getting up. Walk always in a well-lit room (use night lights in the walls). Avoid area rugs or power cords from appliances in the middle of the walkways. Use a walker or a cane if necessary and consider physical therapy for balance exercise. Get your eyesight checked regularly.  FINANCIAL OVERSIGHT: Supervision, especially oversight when making financial decisions or transactions is also recommended.  HOME SAFETY: Consider the safety of the kitchen when operating appliances like stoves, microwave oven, and blender. Consider having supervision and share cooking responsibilities until no longer able to participate in those. Accidents with firearms and other hazards in the house should be identified and addressed as well.   ABILITY TO BE LEFT ALONE: If patient is unable to contact 911 operator, consider using LifeLine, or when the need is there, arrange for someone to stay with patients. Smoking is a fire hazard, consider supervision or cessation. Risk of wandering should be assessed by caregiver and if detected at any point, supervision and safe proof recommendations should be instituted.  MEDICATION SUPERVISION: Inability to self-administer medication needs to be constantly addressed. Implement a mechanism to ensure safe administration of the medications.   DRIVING: Regarding driving, in patients with progressive memory problems, driving will be impaired. We advise to have someone else do the driving if trouble finding directions or if minor accidents are reported. Independent driving assessment is available to determine safety of driving.   If you are interested in the driving assessment, you can contact the following:  [employer] in [place]  [place] [phone]  [facility] [phone] or  [phone]    [place] refers to food and lifestyle choices that are based on the traditions of countries located on the [employer]. This way of eating has been shown to help prevent certain conditions and improve outcomes for people who have chronic diseases, like kidney disease and heart disease. What are tips for following this plan? Lifestyle  Cook and eat meals together with your family, when possible. Drink enough fluid to keep your urine clear or pale yellow. Be physically active every day. This includes: Aerobic exercise like running or swimming. Leisure activities like gardening, walking, or housework. Get 7-8 hours of sleep each night. If recommended by your health care provider, drink red wine in moderation. This means 1 glass a day for nonpregnant women and 2 glasses a day for men. A glass of wine equals 5 oz (150 mL). Reading food labels  Check the serving size of packaged foods. For foods such as rice and pasta, the serving size refers to the amount of cooked product, not dry. Check the total fat in packaged foods. Avoid foods that have saturated fat or trans fats. Check the ingredients list for added sugars, such as corn syrup. Shopping  At the grocery store, buy most of your food from the areas near the walls of the store. This includes: Fresh fruits and vegetables (produce). Grains, beans, nuts, and seeds. Some of these may be available in unpackaged forms or large amounts (in bulk). Fresh seafood. Poultry and eggs. Low-fat dairy products. Buy whole ingredients instead of prepackaged foods. Buy fresh fruits and vegetables in-season from local farmers markets. Buy frozen fruits and vegetables in resealable bags. If you do not have access to quality fresh seafood, buy precooked frozen shrimp or canned fish, such as tuna, salmon, or sardines. Buy small amounts of raw or cooked vegetables, salads, or olives from the deli or salad bar  at your store. Stock your pantry so you always have certain foods on hand, such as olive oil, canned tuna, canned tomatoes, rice, pasta, and beans. Cooking  Cook foods with extra-virgin olive oil instead of using butter or other vegetable oils. Have meat as a side dish, and have vegetables or grains as your main dish. This means having meat in small portions or adding small amounts of meat to foods like pasta or stew. Use beans or vegetables instead of meat in common dishes like chili or lasagna. Experiment with different cooking methods. Try roasting or broiling vegetables instead of steaming or sauteing them. Add frozen vegetables to soups, stews, pasta, or rice. Add nuts or seeds for added healthy fat at each meal. You can add these to yogurt, salads, or vegetable dishes. Marinate fish or vegetables using olive oil, lemon juice, garlic, and fresh herbs. Meal planning  Plan to eat 1 vegetarian meal one day each week. Try to work up to 2 vegetarian meals, if possible. Eat seafood 2 or more times a week. Have healthy snacks readily available, such as: Vegetable sticks with hummus. Greek yogurt. Fruit and nut trail mix. Eat balanced meals throughout the week. This includes: Fruit: 2-3 servings a day Vegetables: 4-5 servings a day Low-fat dairy: 2 servings a day Fish, poultry, or lean meat: 1 serving a day Beans and legumes: 2 or more servings a week Nuts and seeds: 1-2 servings a day Whole grains: 6-8 servings a day  Extra-virgin olive oil: 3-4 servings a day Limit red meat and sweets to only a few servings a month What are my food choices? Mediterranean diet Recommended Grains: Whole-grain pasta. Brown rice. Bulgar wheat. Polenta. Couscous. Whole-wheat bread. [place]. Vegetables: Artichokes. Beets. Broccoli. Cabbage. Carrots. Eggplant. Green beans. Chard. Kale. Spinach. Onions. Leeks. Peas. Squash. Tomatoes. Peppers. Radishes. Fruits: Apples. Apricots. Avocado. Berries.  Bananas. Cherries. Dates. Figs. Grapes. Lemons. Melon. Oranges. Peaches. Plums. Pomegranate. Meats and other protein foods: Beans. Almonds. Sunflower seeds. Pine nuts. Peanuts. [place]. Salmon. Scallops. Shrimp. [place]. Tilapia. Clams. Oysters. Eggs. Dairy: Low-fat milk. Cheese. Greek yogurt. Beverages: Water. Red wine. Herbal tea. Fats and oils: Extra virgin olive oil. Avocado oil. Grape seed oil. Sweets and desserts: [place] yogurt with honey. Baked apples. Poached pears. Trail mix. Seasoning and other foods: Basil. Cilantro. Coriander. Cumin. Mint. Parsley. Sage. Rosemary. Tarragon. Garlic. Oregano. Thyme. Pepper. Balsalmic vinegar. Tahini. Hummus. Tomato sauce. Olives. Mushrooms. Limit these Grains: Prepackaged pasta or rice dishes. Prepackaged cereal with added sugar. Vegetables: Deep fried potatoes (french fries). Fruits: Fruit canned in syrup. Meats and other protein foods: Beef. Pork. Lamb. Poultry with skin. Hot dogs. [place] Salines. Dairy: Ice cream. Sour cream. Whole milk. Beverages: Juice. Sugar-sweetened soft drinks. Beer. Liquor and spirits. Fats and oils: Butter. Canola oil. Vegetable oil. Beef fat (tallow). Lard. Sweets and desserts: Cookies. Cakes. Pies. Candy. Seasoning and other foods: Mayonnaise. Premade sauces and marinades. The items listed may not be a complete list. Talk with your dietitian about what dietary choices are right for you. Summary The Mediterranean diet includes both food and lifestyle choices. Eat a variety of fresh fruits and vegetables, beans, nuts, seeds, and whole grains. Limit the amount of red meat and sweets that you eat. Talk with your health care provider about whether it is safe for you to drink red wine in moderation. This means 1 glass a day for nonpregnant women and 2 glasses a day for men. A glass of wine equals 5 oz (150 mL). This information is not intended to replace advice given to you by your health care provider. Make sure you discuss any questions you  have with your health care provider. Document Released: 09/28/2015 Document Revised: 10/31/2015 Document Reviewed: 09/28/2015 Elsevier Interactive Patient Education  2017 [employer].

## 2020-12-11 NOTE — Progress Notes (Signed)
   Subjective:   Patient ID: Theresa Perez, female    DOB: 01-15-1951, 70 y.o.   MRN: [id number]  HPI The patient is a 70 YO female coming in for cough and congestion 1 week as well as new diarrhea which began longer ago than that.  Review of Systems  Constitutional: Negative.  Negative for fatigue, fever and unexpected weight change.  HENT:  Positive for postnasal drip. Negative for congestion, ear discharge, ear pain, rhinorrhea, sinus pressure, sinus pain, sneezing, sore throat, tinnitus, trouble swallowing and voice change.   Eyes: Negative.   Respiratory:  Positive for cough. Negative for chest tightness, shortness of breath and wheezing.   Cardiovascular: Negative.  Negative for chest pain, palpitations and leg swelling.  Gastrointestinal:  Positive for diarrhea. Negative for abdominal distention, abdominal pain, blood in stool, constipation, nausea and vomiting.  Musculoskeletal: Negative.   Skin: Negative.   Neurological: Negative.   Psychiatric/Behavioral: Negative.     Objective:  Physical Exam Constitutional:      Appearance: She is well-developed.  HENT:     Head: Normocephalic and atraumatic.  Cardiovascular:     Rate and Rhythm: Normal rate and regular rhythm.  Pulmonary:     Effort: Pulmonary effort is normal. No respiratory distress.     Breath sounds: Normal breath sounds. No wheezing or rales.     Comments: Coughing during visit Abdominal:     General: Bowel sounds are normal. There is no distension.     Palpations: Abdomen is soft.     Tenderness: There is no abdominal tenderness. There is no rebound.  Musculoskeletal:     Cervical back: Normal range of motion.  Skin:    General: Skin is warm and dry.  Neurological:     Mental Status: She is alert and oriented to person, place, and time.     Coordination: Coordination normal.    Vitals:   12/11/20 0949  BP: 134/78  Pulse: 77  Temp: 98.1 F (36.7 C)  TempSrc: Oral  SpO2: 94%  Weight: 173 lb  (78.5 kg)  Height: 5\' 11"  (1.803 m)   This visit occurred during the SARS-CoV-2 public health emergency.  Safety protocols were in place, including screening questions prior to the visit, additional usage of staff PPE, and extensive cleaning of exam room while observing appropriate contact time as indicated for disinfecting solutions.   Assessment & Plan:

## 2020-12-11 NOTE — Patient Instructions (Signed)
We have sent in an albuterol inhaler to use as needed. We have also sent in a cough medicine to use.

## 2020-12-11 NOTE — Assessment & Plan Note (Signed)
Likely to be post-viral cough. Rx promethazine/dm and albuterol inhaler. She is smoker so likely to cough longer than non-smoker. Lungs clear on exam making secondary pneumonia less likely. She is about 1-2 weeks out from initial infection counseled that this can last 3-4 weeks fairly routinely.

## 2020-12-11 NOTE — Assessment & Plan Note (Signed)
Has not been smoking much since illness.

## 2020-12-11 NOTE — Progress Notes (Signed)
Assessment/Plan:   Theresa Perez is a 70 y.o. year old LH  female with risk factors including GAD, depression, prior history of thyroid papillary cancer, hypothyroidism, family history of dementia seen by neuropsychology here at this office, felt not to be related to dementia.  Her MRI of the brain in June 2021 was normal, without acute findings.  Lab work was normal as well.  Exam is within normal limits.  The patient is interested in pursuing a psychiatric evaluation for medication adjustment, for the treatment of her depression which appears to be interfering with her activities of daily living and her memory.  She has a behavioral therapist for counseling as well, which helps her.  No indication for antidementia drug is present at this time   Recommendations:   Subjective Memory Loss   Discussed safety both in and out of the home.  Discussed the importance of regular daily schedule with inclusion of crossword puzzles to maintain brain function.  Continue to monitor mood with PCP. Continue Cymbalta, Wellbutrin. Continue counseling therapy Referral to Psychiatry Stay active at least 30 minutes at least 3 times a week.  Naps should be scheduled and should be no longer than 60 minutes and should not occur after 2 PM.  Mediterranean diet is recommended  Folllow up as needed  Subjective:    The patient is seen in neurologic consultation at the request of Theresa Perez, * for the evaluation of memory.  The patient is accompanied by her daughter who supplements the history.This is a very pleasant 70 y.o. year old female who prior to this visit was seen by neuropsychology, Dr. Nicole Perez, yielding a diagnosis of subjective memory loss, supported by testing and imaging. She continues to report that her memory is about the same, but she also admits that she has significant level of stress at home, especially with family dynamics.  She admits to a bad relationship with her husband, who  refuses to undergo couples therapy.  She does have a counselor which helps her to deal with the excessive worry and depression, and she takes Wellbutrin and Cymbalta as antidepressants.  However, her daughter feels that she could benefit from psychiatric follow-up while she is on therapy with a counselor.  Her mood has been affected by the Theresa Perez pandemic.  She has moments of irritability.  She does not sleep well, 2 or 3 hours at the time, and has vivid dreams she describes it as "grooving dreams ".  She denies any sleepwalking, hallucinations or paranoia.  She continues to drive, her daughter states that she drives with GPS all the time, even to places that she is familiar with.  She is independent of bathing and dressing.  She places in a pillbox and medications.  She is in charge of the finances.  Her appetite is good, denies dysphagia, denies leaving the stove on.  She ambulates without difficulty, denies any head injuries.  She has chronic intermittent right frontal headaches, but lasting a few minutes, and is not interfering with her activities of daily living, they do not appear very frequent, only when she is more stressed out.  She denies any double vision, dizziness, focal numbness or tingling, unilateral weakness or tremors or anosmia.  No history of seizures.  She denies urine incontinence, retention, constipation or diarrhea, sleep apnea, alcohol or tobacco.  Family history strong for Alzheimer's disease in her mother and other family members.    MRI brain wo contrast 07/2019 1. No acute intracranial  abnormality. 2. Mild chronic small vessel ischemic changes of the white matter.  Labs 04/2020 B12 820 TSH 5.08 T4 0.77    Neurocognitive testing Dr. Nicole Perez 08/02/19" We discussed impression of essentially normal cognitive function on Neuropsych testing. We discussed that family history of Alzheimer's can cause individuals to overattribute normal cognitive errors to decline when none has occurred. I  also explained that it is possible there are very subtle changes that are not detectable by neuropsychological means. We discussed counseling, they weren't interested in couples counseling but Ms. Theresa Perez accepted a referral for individual psychotherapy. I took time to explain the findings and answer all the patient's questions. I encouraged Ms. Theresa Perez to contact me should she have any further questions or if further follow up is desired. "     Allergies  Allergen Reactions   Permethrin Rash    Current Outpatient Medications  Medication Instructions   albuterol (VENTOLIN HFA) 108 (90 Base) MCG/ACT inhaler 2 puffs, Inhalation, Every 6 hours PRN   Ascorbic Acid (VITAMIN C PO) 1 tablet, Daily   atenolol-chlorthalidone (TENORETIC) 50-25 MG tablet Take 1/2 (one-half) tablet by mouth once daily   Black Cohosh 540 mg, 2 times daily   buPROPion (WELLBUTRIN) 75 MG tablet Take 1 tablet by mouth once daily   butalbital-acetaminophen-caffeine (ESGIC) 50-325-40 MG tablet 1 tablet, Oral, 2 times daily PRN   CALCIUM PO 1 tablet, Daily   COENZYME Q-10 PO 1 tablet, Daily   Cyanocobalamin (VITAMIN B-12 PO) 1 tablet, Daily   DULoxetine (CYMBALTA) 60 MG capsule Take 1 capsule by mouth twice daily   ECHINACEA EXTRACT PO Oral   EUTHYROX 100 MCG tablet Take 1 tablet by mouth once daily   EVENING PRIMROSE OIL PO Oral   ferrous sulfate 325 mg, Every evening   fluticasone (FLONASE) 50 MCG/ACT nasal spray Use 2 spray(s) in each nostril once daily   Ginkgo Biloba (GNP GINGKO BILOBA EXTRACT PO) 1 tablet, Daily   LORazepam (ATIVAN) 0.5 MG tablet TAKE 1 TABLET BY MOUTH ONCE DAILY AS NEEDED FOR ANXIETY   Misc Natural Products (GLUCOSAMINE CHOND COMPLEX/MSM) TABS 1 tablet, 2 times daily   Multiple Vitamins-Minerals (MULTIVITAMIN WITH MINERALS) tablet 1 tablet, Daily   Nutritional Supplements (DHEA PO) 1 tablet, Daily   predniSONE (DELTASONE) 20 mg, Oral, Daily with breakfast   promethazine-dextromethorphan  (PROMETHAZINE-DM) 6.25-15 MG/5ML syrup 5 mLs, Oral, 4 times daily PRN   triamcinolone cream (KENALOG) 0.1 % 1 application, Topical, 2 times daily   Turmeric (QC TUMERIC COMPLEX PO) Oral     VITALS:   Vitals:   12/11/20 1334  BP: 138/82  Pulse: 85  Resp: 18  SpO2: 95%  Weight: 173 lb (78.5 kg)  Height: 5' 10.5" (1.791 m)    PHYSICAL EXAM   HEENT:  Normocephalic, atraumatic. The mucous membranes are moist. The superficial temporal arteries are without ropiness or tenderness. Cardiovascular: Regular rate and rhythm. Lungs: Clear to auscultation bilaterally. Neck: There are no carotid bruits noted bilaterally.  NEUROLOGICAL: Montreal Cognitive Assessment  07/26/2019  Visuospatial/ Executive (0/5) 5  Naming (0/3) 3  Attention: Read list of digits (0/2) 2  Attention: Read list of letters (0/1) 1  Attention: Serial 7 subtraction starting at 100 (0/3) 3  Language: Repeat phrase (0/2) 2  Language : Fluency (0/1) 1  Abstraction (0/2) 1  Delayed Recall (0/5) 5  Orientation (0/6) 6  Total 29   MMSE - Mini Mental State Exam 08/16/2019  Not completed: Refused    No flowsheet data found.  Orientation:  Alert and oriented to person, place and time. No aphasia or dysarthria. Fund of knowledge is appropriate. Recent memory impaired and remote memory intact.  Attention and concentration are normal.  Able to name objects and repeat phrases Cranial nerves: There is good facial symmetry. Extraocular muscles are intact and visual fields are full to confrontational testing. Speech is fluent and clear. Soft palate rises symmetrically and there is no tongue deviation. Hearing is intact to conversational tone. Tone: Tone is good throughout. Sensation: Sensation is intact to light touch and pinprick throughout. Vibration is intact at the bilateral big toe.There is no extinction with double simultaneous stimulation. There is no sensory dermatomal level identified. Coordination: The patient has no  difficulty with RAM's or FNF bilaterally. Normal finger to nose  Motor: Strength is 5/5 in the bilateral upper and lower extremities. There is no pronator drift. There are no fasciculations noted. DTR's: Deep tendon reflexes are 2/4 at the bilateral biceps, triceps, brachioradialis, patella and achilles.  Plantar responses are downgoing bilaterally. Gait and Station: The patient is able to ambulate without difficulty.The patient is able to heel toe walk without any difficulty.The patient is able to ambulate in a tandem fashion. The patient is able to stand in the Romberg position.     Thank you for allowing [place] the opportunity to participate in the care of this nice patient. Please do not hesitate to contact us for any questions or concerns.   Total time spent on today's visit was 60 minutes, including both face-to-face time and nonface-to-face time.  Time included that spent on review of records (prior notes available to me/labs/imaging if pertinent), discussing treatment and goals, answering patient's questions and coordinating care.  Cc:  Theresa Koch, MD  Sharene Butters 12/11/2020 2:05 PM

## 2020-12-11 NOTE — Assessment & Plan Note (Signed)
New problem and daily with several BM daily. There is mostly liquid. She has taken multiple courses of antibiotics in the last few months with keflex and clindamycin. We need to rule out C dif infection given persistent nature of this. Ordered GI profile stool to rule out C dif as well as other viral and bacteria and parasitic pathogens.

## 2020-12-14 ENCOUNTER — Telehealth: Payer: Self-pay | Admitting: Internal Medicine

## 2020-12-14 NOTE — Telephone Encounter (Signed)
LVM for pt to rtn my call to schedule AWV with [facility].  

## 2020-12-15 ENCOUNTER — Telehealth: Payer: Self-pay | Admitting: Internal Medicine

## 2020-12-15 NOTE — Telephone Encounter (Signed)
Is she worse or just not better? Cough can linger for 2-4 weeks so I would not expect her to be better yet.

## 2020-12-15 NOTE — Telephone Encounter (Signed)
Patient states medication prescribed for respiratory infection is not helping  Patient states she is coughing up green/yellow phlegm  Patient requesting another rx  [facility] ([facility]), [place] [facility] [place]

## 2020-12-15 NOTE — Telephone Encounter (Signed)
See below

## 2020-12-18 NOTE — Telephone Encounter (Signed)
Pt. Has called and states she still has green and yellow phlegm. States that she would like more medication. Pt. Was informed of last message from provider. Would like a call back to discuss what she should do next.    [facility] [place] [place] [facility] [place] #- [phone]

## 2020-12-19 NOTE — Telephone Encounter (Signed)
Phone went straight to voicemail. LVM asking her to return my call. Office number was provided.

## 2020-12-20 ENCOUNTER — Ambulatory Visit: Payer: Medicare PPO | Admitting: Psychiatry

## 2020-12-21 ENCOUNTER — Ambulatory Visit: Payer: Medicare PPO | Admitting: Psychiatry

## 2020-12-21 ENCOUNTER — Other Ambulatory Visit: Payer: Self-pay | Admitting: Internal Medicine

## 2020-12-21 ENCOUNTER — Other Ambulatory Visit: Payer: Self-pay

## 2020-12-21 DIAGNOSIS — F411 Generalized anxiety disorder: Secondary | ICD-10-CM

## 2020-12-21 NOTE — Progress Notes (Signed)
[facility] Counselor/Therapist Progress Note  Patient ID: Theresa Perez, MRN: [id number],    Date: 12/21/2020  Time Spent: 55 minutes   Treatment Type: Individual Therapy  Reported Symptoms: anxiety (improving); some depression re: home situation  Mental Status Exam:  Appearance:   Casual and Neat     Behavior:  Appropriate, Sharing, and Motivated  Motor:  Normal  Speech/Language:   Clear and Coherent  Affect:  Depressed and anxiety  Mood:  anxious and depressed  Thought process:  goal directed  Thought content:    Some obsessiveness  Sensory/Perceptual disturbances:    WNL  Orientation:  oriented to person, place, time/date, situation, day of week, month of year, year, and stated date of Nov. 3, 2022  Attention:  Good  Concentration:  Good  Memory:  Some recent memory issues esp under stress  Fund of knowledge:   Good  Insight:    Good  Judgment:   Good  Impulse Control:  Good   Risk Assessment: Danger to Self:  No Self-injurious Behavior: No Danger to Others: No Duty to Warn:no Physical Aggression / Violence:No  Access to Firearms a concern: No  Gang Involvement:No   Subjective: Patient in today reporting anxiety and some depression (improving). "Hard living with someone I can't change" and processed these thoughts as "it makes me sad". Wants husband to be more active and husband is not as interested in this but also has some physical issues involving some back pain.  Marital stressors shared and discussed. "Husband not willing to discuss their issues much." Patient doesn't have many other friends and continue to encourage her to connect with people in ways that feel comfortable with patient, including people within her church which she seems to have more connection to, but states she doesn't feel that close to them but seems to be more willing at this point to make efforts to get to know them better.  Did see a neurologist recently at the urging of her  granddaughter (who patient reports can be controlling) and states that the neurologist did not feel like she was having memory loss so patient felt encouraged by this and actually had not felt she was having unusual memory loss.  Continuing to focus on "letting go in order to move forward" and patient is showing some gradual improvement in her control issues, and in her interactions with husband.  Encouraged more consistent listening as an ongoing practice when she is in conversations with people. Patient does feel that she is continuing to improve including some self-calming, trying not to overly talk and share conversations, decreasing her overthinking and worrying, and "praying more".  Admits that she still struggles not being able to control and change everything that she would like to but also knows that is not realistic, so working to better except that and seems to be making some progress in that area.  Interventions: Solution-Oriented/Positive Psychology and Insight-Oriented  Diagnosis:   ICD-10-CM   1. Generalized anxiety disorder  F41.1       Treatment goal plan:  Patient not signing treatment plan on computer screen due to Covid. Treatment Goals: Goals remain on treatment plan as patient works on strategies to achieve her goals.  Progress will be noted each week in "Progress" section of treatment plan. Long term goal: Reduce overall level, frequency, and intensity of the anxiety so that daily functioning is not impaired. Short term goal: Identify, challenge, and replace anxious/fearful/depressive thoughts and self-talk with more positive, realistic,  and empowering thoughts and self-talk that do not support anxiety nor depression. Strategy: Review, repeat, and reinforce success.    Plan: Patient today showing good motivation and engagement in session as she worked with some of her anxiety and marital relationship issues.  Has "ups and downs" with her husband but trying to focus on  what she needs to do to help in the relationship, and also be aware of certain behaviors that might be unhelpful in their relationship as she definitely wants it to improve and be healthy.  Encouraged patient and her practice of positive behaviors in between sessions including: Believing in herself more, focusing more on letting go of trying to control things that she cannot, practice the active listening skills to really hear the other person speaking before she responds, decrease her rigid overthinking and over analyzing as to "how things should be", having more quiet time for reflection and prayer as this is important for her and she finds it helpful, paying attention to how she says what she says, getting outside daily and walking, enjoying time with her pet dog which is definitely therapeutic for her, continuing her exercise at the gym, consistent positive self talk, remaining involved in groups that are healthy for her including church groups and crafting, staying in touch with supportive people, remaining in the present focusing on what she can control or change, cutting back on her smoking, becoming more self aware especially in interactions with others, healthy boundaries with others as needed, and realize the strength she shows with goal-directed behaviors towards a direction that supports improved emotional health and overall wellbeing.  Review and progress/challenges noted with patient.  Next appointment within 3 weeks.  This record has been created using [employer].  Chart creation errors have been sought, but may not always have been located and corrected.  Such creation errors do not reflect on the standard of medical care provided.    Shanon Ace, LCSW

## 2021-01-01 ENCOUNTER — Encounter: Payer: Self-pay | Admitting: Adult Health

## 2021-01-01 ENCOUNTER — Other Ambulatory Visit: Payer: Self-pay

## 2021-01-01 ENCOUNTER — Ambulatory Visit: Payer: Medicare PPO | Admitting: Adult Health

## 2021-01-01 VITALS — BP 133/83 | HR 75 | Ht 71.0 in | Wt 169.0 lb

## 2021-01-01 DIAGNOSIS — F411 Generalized anxiety disorder: Secondary | ICD-10-CM | POA: Diagnosis not present

## 2021-01-01 DIAGNOSIS — F428 Other obsessive-compulsive disorder: Secondary | ICD-10-CM | POA: Diagnosis not present

## 2021-01-01 MED ORDER — BUPROPION HCL ER (XL) 150 MG PO TB24: 150.0000 mg | ORAL_TABLET | Freq: Every day | ORAL | 1 refills | Status: DC

## 2021-01-01 NOTE — Progress Notes (Signed)
[facility] MD/PA/NP Initial Note  01/01/2021 2:35 PM Theresa Perez  MRN:  [id number]  HPI:   Referred by Rinaldo Cloud.  Describes mood today as "ok". Pleasant. Tearful at times. Mood symptoms - reports depression - "periods of feeling down" anxiety - "worry and rumination", and irritability - "not really". Feels more anxious overall. Gets anxious over the things she can't change or control - "I worry too much about things". Has difficulties just being being in the "moment". Reports making lists. Stating "I'm a list person". Able to get things done - family used to say I was obsessive - "not anymore". Stating "I just try and keep things neat right now". Feels like she "struggles" more in the mornings. Lacking motivation and "get up and go". Stating "the busier I can keep myself, the better I do". Reports taking Wellbutrin 37.5mg  twice a day to help with motivation with some results. Does not feel like the medication last throughout the day. Also feels like she is struggling in her marriage. Stating "I'm frustrated with husband". He spends the majority of the day in the garage watching tv - sleeps in the recliner and doesn't come to bed. Sleeps alone with dog. Stating "I would like to have a better relationship with my husband - "but I don't know what else I can do". Has gotten involved in church. Working with therapist Rinaldo Cloud and feels like that is helping her. Stable interest and motivation. Taking medications as prescribed.  Energy levels mostly stable. Active, does not have a regular exercise routine.  Enjoys some usual interests and activities. Married x 51 years. Has one son - 25 grand-daughter and 2 grand-children 107 and 44 and 55/5 years old. Spending time with family. Appetite adequate. Weight stable - 170 pounds. Sleeps well most nights. Averages 7 to 8 hours. Sleeps with dog. Husband sleeps in the garage - "man cave". Focus and concentration stable - "most of the time". Completing tasks.  Managing aspects of household. 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 Denies SI or HI.  Denies AH or VH. Seeing therapist Rinaldo Cloud x 1 year.  Previous medication trials:  Cymbalta, Wellbutrin, Seroquel, Zoloft  Visit Diagnosis:    ICD-10-CM   1. Generalized anxiety disorder  F41.1 buPROPion (WELLBUTRIN XL) 150 MG 24 hr tablet    2. Obsessional thoughts  F42.8 buPROPion (WELLBUTRIN XL) 150 MG 24 hr tablet      Past Psychiatric History: Admitted once in 2011 for depression. Discharged on Seroquel and Zoloft.  Past Medical History:  Past Medical History:  Diagnosis Date   Allergy    Anxiety    Cancer ([place])    Thyroid-Papillary   Complication of anesthesia    Cyst of breast, right, diffuse fibrocystic    Hypertension    Hypothyroidism    PONV (postoperative nausea and vomiting)     Past Surgical History:  Procedure Laterality Date   ANTERIOR AND POSTERIOR REPAIR N/A 03/31/2012   Procedure: ANTERIOR (CYSTOCELE) AND POSTERIOR REPAIR (RECTOCELE);  Surgeon: Gus Height, MD;  Location: [facility];  Service: Gynecology;  Laterality: N/A;   aspiration cyst right breast     [place]   uterine ablation     VAGINAL HYSTERECTOMY N/A 03/31/2012   Procedure: HYSTERECTOMY VAGINAL;  Surgeon: Gus Height, MD;  Location: [facility];  Service: Gynecology;  Laterality: N/A;    Family Psychiatric History: Denies any family history of mental illness.   Family History:  Family History  Problem Relation Age of  Onset   Diabetes Mother    Deep vein thrombosis Mother        Varicose Veins   Cancer Father        Father died from brain tumor.   Heart disease Father    Colon cancer Neg Hx    Stomach cancer Neg Hx     Social History:  Social History   Socioeconomic History   Marital status: Married    Spouse name: Not on file   Number of children: 1   Years of education: 14   Highest education level: Not on file  Occupational History   Not on file  Tobacco Use   Smoking  status: Every Day    Packs/day: 0.30    Years: 15.00    Pack years: 4.50    Types: Cigarettes   Smokeless tobacco: Never   Tobacco comments:    Occassional smoker  Substance and Sexual Activity   Alcohol use: Yes    Comment: Rarely   Drug use: No   Sexual activity: Yes    Birth control/protection: Post-menopausal  Other Topics Concern   Not on file  Social History Narrative   Left handed   Drinks caffeine   Two story home   Social Determinants of Health   Financial Resource Strain: Not on file  Food Insecurity: Not on file  Transportation Needs: Not on file  Physical Activity: Not on file  Stress: Not on file  Social Connections: Not on file    Allergies:  Allergies  Allergen Reactions   Permethrin Rash    Metabolic Disorder Labs: Lab Results  Component Value Date   HGBA1C 5.6 05/08/2020   No results found for: PROLACTIN Lab Results  Component Value Date   CHOL 186 08/16/2019   TRIG 115.0 08/16/2019   HDL 49.50 08/16/2019   CHOLHDL 4 08/16/2019   VLDL 23.0 08/16/2019   LDLCALC 113 (H) 08/16/2019   LDLCALC 109 (H) 08/13/2018   Lab Results  Component Value Date   TSH 5.08 (H) 05/08/2020   TSH 4.82 (H) 08/16/2019    Therapeutic Level Labs: No results found for: LITHIUM No results found for: VALPROATE No components found for:  CBMZ  Current Medications: Current Outpatient Medications  Medication Sig Dispense Refill   buPROPion (WELLBUTRIN XL) 150 MG 24 hr tablet Take 1 tablet (150 mg total) by mouth daily. 90 tablet 1   albuterol (VENTOLIN HFA) 108 (90 Base) MCG/ACT inhaler Inhale 2 puffs into the lungs every 6 (six) hours as needed for wheezing or shortness of breath. 8 g 0   Ascorbic Acid (VITAMIN C PO) Take 1 tablet by mouth daily.     atenolol-chlorthalidone (TENORETIC) 50-25 MG tablet Take 1/2 (one-half) tablet by mouth once daily 45 tablet 3   Black Cohosh 540 MG CAPS Take 540 mg by mouth 2 (two) times daily.      buPROPion (WELLBUTRIN) 75 MG  tablet Take 1 tablet by mouth once daily 45 tablet 0   butalbital-acetaminophen-caffeine (ESGIC) 50-325-40 MG tablet Take 1 tablet by mouth 2 (two) times daily as needed for headache. 30 tablet 0   CALCIUM PO Take 1 tablet by mouth daily.     COENZYME Q-10 PO Take 1 tablet by mouth daily.     Cyanocobalamin (VITAMIN B-12 PO) Take 1 tablet by mouth daily.     DULoxetine (CYMBALTA) 60 MG capsule Take 1 capsule by mouth twice daily 180 capsule 3   ECHINACEA EXTRACT PO Take by mouth.  EUTHYROX 100 MCG tablet Take 1 tablet by mouth once daily 90 tablet 1   EVENING PRIMROSE OIL PO Take by mouth.     ferrous sulfate 325 (65 FE) MG tablet Take 325 mg by mouth every evening.      fluticasone (FLONASE) 50 MCG/ACT nasal spray Use 2 spray(s) in each nostril once daily 48 g 0   Ginkgo Biloba (GNP GINGKO BILOBA EXTRACT PO) Take 1 tablet by mouth daily.     LORazepam (ATIVAN) 0.5 MG tablet TAKE 1 TABLET BY MOUTH ONCE DAILY AS NEEDED FOR ANXIETY 20 tablet 0   Misc Natural Products (GLUCOSAMINE CHOND COMPLEX/MSM) TABS Take 1 tablet by mouth 2 (two) times daily.     Multiple Vitamins-Minerals (MULTIVITAMIN WITH MINERALS) tablet Take 1 tablet by mouth daily.     Nutritional Supplements (DHEA PO) Take 1 tablet by mouth daily.     predniSONE (DELTASONE) 20 MG tablet Take 1 tablet (20 mg total) by mouth daily with breakfast. 10 tablet 0   triamcinolone cream (KENALOG) 0.1 % Apply 1 application topically 2 (two) times daily. 30 g 1   Turmeric (QC TUMERIC COMPLEX PO) Take by mouth.     No current facility-administered medications for this visit.    Medication Side Effects: none  Orders placed this visit:  No orders of the defined types were placed in this encounter.   Psychiatric Specialty Exam:  Review of Systems  Musculoskeletal:  Negative for gait problem.  Neurological:  Negative for tremors.  Psychiatric/Behavioral:         Please refer to HPI   Blood pressure 133/83, pulse 75, height 5\' 11"   (1.803 m), weight 169 lb (76.7 kg).Body mass index is 23.57 kg/m.  General Appearance: Casual and Neat  Eye Contact:  Good  Speech:  Clear and Coherent and Normal Rate  Volume:  Normal  Mood:  Anxious  Affect:  Appropriate and Congruent  Thought Process:  Coherent and Descriptions of Associations: Intact  Orientation:  Full (Time, Place, and Person)  Thought Content: Logical   Suicidal Thoughts:  No  Homicidal Thoughts:  No  Memory:  WNL  Judgement:  Good  Insight:  Good  Psychomotor Activity:  Normal  Concentration:  Concentration: Good  Recall:  Good  Fund of Knowledge: Good  Language: Good  Assets:  Communication Skills Desire for Improvement Financial Resources/Insurance Housing Intimacy Leisure Time Physical Health Resilience Social Support Talents/Skills Transportation Vocational/Educational  ADL's:  Intact  Cognition: WNL  Prognosis:  Good   Screenings:  GAD-7    Flowsheet Row Office Visit from 06/25/2019 in [place] at [facility]  Total GAD-7 Score 4      PHQ2-9    Flowsheet Row Video Visit from 02/14/2020 in [facility] from 08/16/2019 in [place] at [facility] Visit from 06/25/2019 in [place] at [facility] Visit from 08/13/2018 in [place] from 01/09/2018 in [facility]  PHQ-2 Total Score 0 2 2 0 0  PHQ-9 Total Score -- -- 3 -- --       Receiving Psychotherapy: Yes - Rinaldo Cloud  Treatment Plan/Recommendations:   Plan:  PDMP reviewed  Add Wellbutrin XL-150mg  every morning - denies seizure history D/C Wellbutrin 75mg  Cymbalta 60mg  BID  RTC 4 weeks   Time spent with patient was 60 minutes. Greater than 50% of face to face time with patient was spent on counseling and coordination of care.  Patient advised to contact office with any questions, adverse effects, or acute worsening in signs  and symptoms.   Aloha Gell, NP

## 2021-01-10 ENCOUNTER — Ambulatory Visit: Payer: Medicare PPO | Admitting: Psychiatry

## 2021-01-10 ENCOUNTER — Other Ambulatory Visit: Payer: Self-pay

## 2021-01-10 DIAGNOSIS — F411 Generalized anxiety disorder: Secondary | ICD-10-CM | POA: Diagnosis not present

## 2021-01-10 NOTE — Progress Notes (Signed)
[facility] Counselor/Therapist Progress Note  Patient ID: Theresa Perez, MRN: [id number],    Date: 01/10/2021  Time Spent: 48  minutes   Treatment Type: Individual Therapy  Reported Symptoms:  anxiety, obsessiveness  Mental Status Exam:  Appearance:   Casual     Behavior:  Appropriate, Sharing, and Motivated  Motor:  Normal  Speech/Language:   Clear and Coherent  Affect:  anxious  Mood:  anxious  Thought process:  goal directed  Thought content:    Obsessions and some ruminating  Sensory/Perceptual disturbances:    WNL  Orientation:  oriented to person, place, time/date, situation, day of week, month of year, year, and stated date of Nov. 23, 2022.   Attention:  Good  Concentration:  Good  Memory:  WNL  Fund of knowledge:   Good  Insight:    Good and Fair  Judgment:   Good  Impulse Control:  Good   Risk Assessment: Danger to Self:  No Self-injurious Behavior: No Danger to Others: No Duty to Warn:no Physical Aggression / Violence:No  Access to Firearms a concern: No  Gang Involvement:No   Subjective: Patient today reporting lessened depression and some decrease in anxiety. Struggles with relationship with husband and "the fact I can't change him", and shared more of the issues that leads to her struggle. Husband not wanting to be as active as patient and doesn't have the same interests. Husband also has some physical problems that limits him, and does not discuss marital issues with patient, and patient is definitely a Metallurgist.  Patient very talkative and has previously been encouraged to listen more and that may help husband would talk more. Processed more negatives from her past as she works on letting go in order to move forward, and have fewer control issues. Feels she is improving in her ability to self-calm. Reaching out to more people especially within her church.  Encouraged patient in her continuing to work on her worrying and overthinking. States  "praying often helps me".  Was more obsessive today and worked on that at end of session.   Interventions: Solution-Oriented/Positive Psychology, Ego-Supportive, and Insight-Oriented  Treatment goal plan:  Patient not signing treatment plan on computer screen due to Covid. Treatment Goals: Goals remain on treatment plan as patient works on strategies to achieve her goals.  Progress will be noted each week in "Progress" section of treatment plan. Long term goal: Reduce overall level, frequency, and intensity of the anxiety so that daily functioning is not impaired. Short term goal: Identify, challenge, and replace anxious/fearful/depressive thoughts and self-talk with more positive, realistic, and empowering thoughts and self-talk that do not support anxiety nor depression. Strategy: Review, repeat, and reinforce success.    Diagnosis:   ICD-10-CM   1. Generalized anxiety disorder  F41.1       Plan:  Patient today showing good motivation and active participation in session as she worked on some of her obsessive patterns, worrying, overthinking, letting go of control, and relationship with husband.  Trying to stay focused more on what she can do to help in her relationship with husband rather than "what he needs to do."  Encouraged patient and her practicing of positive behaviors between sessions including:  Believing in herself more. Focusing more on letting go of trying to control things that she cannot control. Practice the active listening skills to really hear what the other person is saying. Decrease her rigid overthinking and over analyzing as to how "things should be".  Having more quiet time for reflection and prayer as this has been helpful for her. Paying attention to how she says what she says. Getting outside daily and walking. Enjoying time with her pet dog which is very therapeutic for her. Continue her exercise at the gym. Consistent positive self talk. Remaining  involved in groups that are healthy for her especially church groups. Staying in touch with supportive people. Remaining in the present focusing on what she can change. Cutting back on her smoking. Becoming more self aware especially in interactions with others. Healthy boundaries with others. Recognize the strength she shows working with goal-directed behaviors towards a direction that supports improved emotional health.   Goal review and progress/challenges noted with patient.  Next appointment within 3 weeks.  This record has been created using [employer].  Chart creation errors have been sought, but may not always have been located and corrected.  Such creation errors do not reflect on the standard of medical care provided.    Shanon Ace, LCSW

## 2021-01-18 ENCOUNTER — Ambulatory Visit: Payer: Medicare PPO | Admitting: Psychiatry

## 2021-01-29 ENCOUNTER — Other Ambulatory Visit: Payer: Self-pay

## 2021-01-29 ENCOUNTER — Encounter: Payer: Self-pay | Admitting: Adult Health

## 2021-01-29 ENCOUNTER — Ambulatory Visit: Payer: Medicare PPO | Admitting: Adult Health

## 2021-01-29 DIAGNOSIS — F428 Other obsessive-compulsive disorder: Secondary | ICD-10-CM

## 2021-01-29 DIAGNOSIS — F411 Generalized anxiety disorder: Secondary | ICD-10-CM | POA: Diagnosis not present

## 2021-01-29 NOTE — Progress Notes (Signed)
Theresa Perez [id number] [birth year] 70 y.o.  Subjective:   Patient ID:  Theresa Perez is a 70 y.o. (DOB 1950-03-13) female.  Chief Complaint: No chief complaint on file.   HPI Theresa Perez presents to the office today for follow-up of anxiety and obsessional thoughts.  Referred by Rinaldo Cloud.  Describes mood today as "better". Pleasant. Tearful at times. Mood symptoms - reports decreased depression, anxiety and irritability. Stating "I'm having good and bad days, but more good days". Feels "stressed" with situational stressors. Feels like the change in Wellbutrin to the XL formula with an increased dose has been helpful and would like to continue. She and husband doing well. Involved in church. Working with therapist Rinaldo Cloud and feels like that is helping her. Stable interest and motivation.  Taking medications as prescribed.  Energy levels mostly stable. Active, does not have a regular exercise routine.  Enjoys some usual interests and activities. Married x 51 years. Has one son - 70 grand-daughter and 2 grand-children 62 and 11 and 41/30 years old. Spending time with family. Appetite adequate. Weight stable - 170 pounds. Sleeps well most nights. Averages 7 to 8 hours. Sleeps with dog. Husband sleeps in the garage - "man cave". Focus and concentration stable - "most of the time". Completing tasks. Managing aspects of household. 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 Denies SI or HI.  Denies AH or VH. Seeing therapist Rinaldo Cloud x 1 year.  Previous medication trials:  Cymbalta, Wellbutrin, Seroquel, Zoloft   GAD-7    Flowsheet Row Office Visit from 06/25/2019 in [place] at [facility]  Total GAD-7 Score 4      PHQ2-9    [place] Video Visit from 02/14/2020 in [place] from 08/16/2019 in [place] at [facility] Visit from 06/25/2019 in [place] at [facility] Visit from 08/13/2018 in [place] Visit from 01/09/2018 in [place]  PHQ-2 Total Score 0 2 2 0 0  PHQ-9 Total Score -- -- 3 -- --        Review of Systems:  Review of Systems  Musculoskeletal:  Negative for gait problem.  Neurological:  Negative for tremors.  Psychiatric/Behavioral:         Please refer to HPI   Medications: I have reviewed the patient's current medications.  Current Outpatient Medications  Medication Sig Dispense Refill   albuterol (VENTOLIN HFA) 108 (90 Base) MCG/ACT inhaler Inhale 2 puffs into the lungs every 6 (six) hours as needed for wheezing or shortness of breath. 8 g 0   Ascorbic Acid (VITAMIN C PO) Take 1 tablet by mouth daily.     atenolol-chlorthalidone (TENORETIC) 50-25 MG tablet Take 1/2 (one-half) tablet by mouth once daily 45 tablet 3   Black Cohosh 540 MG CAPS Take 540 mg by mouth 2 (two) times daily.      buPROPion (WELLBUTRIN XL) 150 MG 24 hr tablet Take 1 tablet (150 mg total) by mouth daily. 90 tablet 1   buPROPion (WELLBUTRIN) 75 MG tablet Take 1 tablet by mouth once daily 45 tablet 0   butalbital-acetaminophen-caffeine (ESGIC) 50-325-40 MG tablet Take 1 tablet by mouth 2 (two) times daily as needed for headache. 30 tablet 0   CALCIUM PO Take 1 tablet by mouth daily.     COENZYME Q-10 PO Take 1 tablet by mouth daily.     Cyanocobalamin (VITAMIN B-12 PO) Take 1 tablet by mouth daily.  DULoxetine (CYMBALTA) 60 MG capsule Take 1 capsule by mouth twice daily 180 capsule 3   ECHINACEA EXTRACT PO Take by mouth.     EUTHYROX 100 MCG tablet Take 1 tablet by mouth once daily 90 tablet 1   EVENING PRIMROSE OIL PO Take by mouth.     ferrous sulfate 325 (65 FE) MG tablet Take 325 mg by mouth every evening.      fluticasone (FLONASE) 50 MCG/ACT nasal spray Use 2 spray(s) in each nostril once daily 48 g 0   Ginkgo Biloba (GNP GINGKO BILOBA EXTRACT PO) Take 1 tablet by mouth daily.     LORazepam (ATIVAN) 0.5 MG tablet TAKE  1 TABLET BY MOUTH ONCE DAILY AS NEEDED FOR ANXIETY 20 tablet 0   Misc Natural Products (GLUCOSAMINE CHOND COMPLEX/MSM) TABS Take 1 tablet by mouth 2 (two) times daily.     Multiple Vitamins-Minerals (MULTIVITAMIN WITH MINERALS) tablet Take 1 tablet by mouth daily.     Nutritional Supplements (DHEA PO) Take 1 tablet by mouth daily.     predniSONE (DELTASONE) 20 MG tablet Take 1 tablet (20 mg total) by mouth daily with breakfast. 10 tablet 0   triamcinolone cream (KENALOG) 0.1 % Apply 1 application topically 2 (two) times daily. 30 g 1   Turmeric (QC TUMERIC COMPLEX PO) Take by mouth.     No current facility-administered medications for this visit.    Medication Side Effects: None  Allergies:  Allergies  Allergen Reactions   Permethrin Rash    Past Medical History:  Diagnosis Date   Allergy    Anxiety    Cancer ([place])    Thyroid-Papillary   Complication of anesthesia    Cyst of breast, right, diffuse fibrocystic    Hypertension    Hypothyroidism    PONV (postoperative nausea and vomiting)     Past Medical History, Surgical history, Social history, and Family history were reviewed and updated as appropriate.   Please see review of systems for further details on the patient's review from today.   Objective:   Physical Exam:  There were no vitals taken for this visit.  Physical Exam Constitutional:      General: She is not in acute distress. Musculoskeletal:        General: No deformity.  Neurological:     Mental Status: She is alert and oriented to person, place, and time.     Coordination: Coordination normal.  Psychiatric:        Attention and Perception: Attention and perception normal. She does not perceive auditory or visual hallucinations.        Mood and Affect: Mood normal. Mood is not anxious or depressed. Affect is not labile, blunt, angry or inappropriate.        Speech: Speech normal.        Behavior: Behavior normal.        Thought Content: Thought  content normal. Thought content is not paranoid or delusional. Thought content does not include homicidal or suicidal ideation. Thought content does not include homicidal or suicidal plan.        Cognition and Memory: Cognition and memory normal.        Judgment: Judgment normal.     Comments: Insight intact    Lab Review:     Component Value Date/Time   NA 142 05/08/2020 1153   K 3.5 05/08/2020 1153   CL 103 05/08/2020 1153   CO2 31 05/08/2020 1153   GLUCOSE 71 05/08/2020 1153   BUN  11 05/08/2020 1153   CREATININE 0.68 05/08/2020 1153   CALCIUM 9.0 05/08/2020 1153   PROT 6.9 05/08/2020 1153   ALBUMIN 4.4 05/08/2020 1153   AST 26 05/08/2020 1153   ALT 22 05/08/2020 1153   ALKPHOS 42 05/08/2020 1153   BILITOT 0.4 05/08/2020 1153   GFRNONAA >90 11/17/2013 1447   GFRAA >90 11/17/2013 1447       Component Value Date/Time   WBC 7.1 05/08/2020 1153   RBC 4.35 05/08/2020 1153   HGB 13.3 05/08/2020 1153   HCT 38.9 05/08/2020 1153   PLT 341.0 05/08/2020 1153   MCV 89.6 05/08/2020 1153   MCH 30.1 11/17/2013 1447   MCHC 34.2 05/08/2020 1153   RDW 13.3 05/08/2020 1153   LYMPHSABS 2.1 12/01/2013 0951   MONOABS 0.5 12/01/2013 0951   EOSABS 0.3 12/01/2013 0951   BASOSABS 0.0 12/01/2013 0951    No results found for: POCLITH, LITHIUM   No results found for: PHENYTOIN, PHENOBARB, VALPROATE, CBMZ   .res Assessment: Plan:    Plan:  PDMP reviewed  Wellbutrin XL-150mg  every morning - denies seizure history Cymbalta 60mg  BID  NAC Mg L-Threonate  RTC 6 months   Time spent with patient was 25 minutes. Greater than 50% of face to face time with patient was spent on counseling and coordination of care.   Patient advised to contact office with any questions, adverse effects, or acute worsening in signs and symptoms.  There are no diagnoses linked to this encounter.   Please see After Visit Summary for patient specific instructions.  Future Appointments  Date Time Provider  [place]  02/05/2021  5:00 PM Shanon Ace, LCSW CP-CP None  02/21/2021  3:00 PM Shanon Ace, LCSW CP-CP None  03/06/2021  3:00 PM Shanon Ace, LCSW CP-CP None    No orders of the defined types were placed in this encounter.   -------------------------------

## 2021-01-31 ENCOUNTER — Ambulatory Visit: Payer: Medicare PPO | Admitting: Psychiatry

## 2021-01-31 ENCOUNTER — Other Ambulatory Visit: Payer: Self-pay | Admitting: Internal Medicine

## 2021-02-01 ENCOUNTER — Ambulatory Visit: Payer: Medicare PPO | Admitting: Psychiatry

## 2021-02-05 ENCOUNTER — Other Ambulatory Visit: Payer: Self-pay

## 2021-02-05 ENCOUNTER — Ambulatory Visit (INDEPENDENT_AMBULATORY_CARE_PROVIDER_SITE_OTHER): Payer: Medicare PPO | Admitting: Psychiatry

## 2021-02-05 DIAGNOSIS — F411 Generalized anxiety disorder: Secondary | ICD-10-CM

## 2021-02-05 NOTE — Progress Notes (Signed)
[facility] Counselor/Therapist Progress Note  Patient ID: Theresa Perez, MRN: [id number],    Date: 02/05/2021  Time Spent: 55 minutes   Treatment Type: Individual Therapy  Reported Symptoms: anxiety  Mental Status Exam:  Appearance:   Casual     Behavior:  Appropriate, Sharing, and Motivated  Motor:  Normal  Speech/Language:   Clear and Coherent  Affect:  anxious  Mood:  anxious  Thought process:  goal directed  Thought content:    Obsessions  Sensory/Perceptual disturbances:    WNL  Orientation:  oriented to person, place, time/date, situation, day of week, month of year, year, and stated date of Dec. 19, 2022  Attention:  Good  Concentration:  Good and Fair  Memory:  WNL  Fund of knowledge:   Good  Insight:    Fair  Judgment:   Good  Impulse Control:  Good   Risk Assessment: Danger to Self:  No Self-injurious Behavior: No Danger to Others: No Duty to Warn:no Physical Aggression / Violence:No  Access to Firearms a concern: No  Gang Involvement:No   Subjective:  Patient in today reporting anxiety as main symptom, and worked today on her anxiety, obsessiveness, worrying, overthinking, marital relationship, and letting go of need to control. Last night didn't sleep well but that isn't typical for me. Wanting to work on her mood, good days versus bad days.  Discussed how she experiences both of these and patient was able to make a coping-card (list of behaviors/thoughts) to help her on a day that is more of a struggle. Tendency to feel she "can't do anything about it" but processed some ideas that she can try and would likely influence in a positive direction.  Anxiety does seem to be a little worse this week but she attributes it more to holiday season.  Communication and interaction with husband is still challenging at times and patient becomes frustrated with husband because he will not talk through their issues according to her report.  She states he is not much  of a communicator "and I am".  Patient does tend to talk a lot and throughout a good part of session.  I did encourage her to make sure she is pausing her talking at times, giving him the time to enter into conversations rather than being just a passive listener, which we have spoken about previously.  Does not have a lot of friends but does reach out to several within her church.  To continue working to reduce her habit of worrying and overthinking and shares that "when I pray, that can help me".  She does have a strong faith and relies on that as a support.   Interventions: Cognitive Behavioral Therapy, Solution-Oriented/Positive Psychology, and Ego-Supportive  Treatment goal plan:  Patient not signing treatment plan on computer screen due to Covid. Treatment Goals: Goals remain on treatment plan as patient works on strategies to achieve her goals.  Progress will be noted each week in "Progress" section of treatment plan. Long term goal: Reduce overall level, frequency, and intensity of the anxiety so that daily functioning is not impaired. Short term goal: Identify, challenge, and replace anxious/fearful/depressive thoughts and self-talk with more positive, realistic, and empowering thoughts and self-talk that do not support anxiety nor depression. Strategy: Review, repeat, and reinforce success.   Diagnosis:   ICD-10-CM   1. Generalized anxiety disorder  F41.1      Plan: Patient today showing good motivation and active engagement in session as she  worked on her anxiety, obsessiveness, worry, overthinking, letting go of need to control, and marital relationship.  Worked especially with some of her anxiety and developing a coping card with several strategies she can use and trying to interrupt her anxious thoughts and challenge them to replace with more realistic and empowering thoughts.  Reviewed some of the communication issues within her marriage.  Encouraging patient still to focus more  in her marital relationship and those issues by looking at "what I can do that might help" versus  "what he needs to do".  Encouraged patient in her practice of positive behaviors including:  Beginning the process of stopping her smoking. Believing in herself more in her ability to change even long time habits that hurt her. Practice the active listening skills to really hear what the other person is saying. Focusing more on letting go of trying to control things that she cannot control.   Having more quiet time for reflection and prayer as this has been helpful to her in past. Decrease her rigid overthinking and over analyzing as to "how things should be". Paying attention to how she says what she says. Getting outside daily and walking when possible. Enjoying time with her pet dog which is very therapeutic for her. Return to her exercise at the gym. Encouraging positive self talk. Remaining involved in groups that are healthy for her especially her church groups. Staying in touch with supportive people. Remaining in the present focusing on what she can change. Becoming more self aware especially in interactions with others. Healthy boundaries with others. Feel good about the strength she shows working with goal-directed behaviors towards a direction that supports improved emotional health.  Goal review and progress/challenges noted with patient.  Next appointment within 3 weeks.  This record has been created using [employer].  Chart creation errors have been sought, but may not always have been located and corrected.  Such creation errors do not reflect on the standard of medical care provided.   Shanon Ace, LCSW

## 2021-02-21 ENCOUNTER — Ambulatory Visit (INDEPENDENT_AMBULATORY_CARE_PROVIDER_SITE_OTHER): Payer: Medicare PPO | Admitting: Psychiatry

## 2021-02-21 ENCOUNTER — Other Ambulatory Visit: Payer: Self-pay

## 2021-02-21 DIAGNOSIS — F411 Generalized anxiety disorder: Secondary | ICD-10-CM

## 2021-02-21 NOTE — Progress Notes (Signed)
[facility] Counselor/Therapist Progress Note  Patient ID: Theresa Perez, MRN: [id number],    Date: 02/21/2021  Time Spent: 48 minutes   Treatment Type: Individual Therapy  Reported Symptoms: anxiety, depression (mild)  Mental Status Exam:  Appearance:   Neat     Behavior:  Appropriate, Sharing, and Motivated  Motor:  Normal  Speech/Language:   Clear and Coherent and very talkative which is normal for patient  Affect:  anxious  Mood:  anxious  Thought process:  goal directed  Thought content:    Obsessions and some ruminating  Sensory/Perceptual disturbances:    WNL  Orientation:  oriented to person, place, time/date, situation, day of week, month of year, year, and stated date of Jan. 4, 2023  Attention:  Good  Concentration:  Fair  Memory:  [place] of knowledge:   Good  Insight:    Good and Fair  Judgment:   Good  Impulse Control:  Good   Risk Assessment: Danger to Self:  No Self-injurious Behavior: No Danger to Others: No Duty to Warn:no Physical Aggression / Violence:No  Access to Firearms a concern: No  Gang Involvement:No   Subjective:  Patient in today reporting anxiety related to personal, marital, family, and not following through on tasks. Obsessive thoughts and worrying. Wants to not lay in bed in the mornings but rather get on up and get started with her day---notices she tends to have a better day when she does get on up.  Continues to experience "control issues" and hard to not be in control. Discussing how to better manage "bad days" versus just smoking 1 cigarette after another and playing games on her tablet. Stated she want to try different ways of managing her "bad days" by reading more, go to gym, walk, pray, sing, play piano, stay more focused, ride bike, call friends. Also working to decrease her obsessive thoughts and worrying, with some progress noted. Looking also at her negative thought patterns, seeing how the impact her negatively, and  replace with more positive and empowering thoughts. Still working with "coping-card" from last session and finding it helpful in her behaviors and in her mood. Some progress reported in her overthinking and worrying. Her strong faith remains a real help to patient.   Interventions: Cognitive Behavioral Therapy, Solution-Oriented/Positive Psychology, and Insight-Oriented   Treatment goal plan:  Patient not signing treatment plan on computer screen due to Covid. Treatment Goals: Goals remain on treatment plan as patient works on strategies to achieve her goals.  Progress will be noted each week in "Progress" section of treatment plan. Long term goal: Reduce overall level, frequency, and intensity of the anxiety so that daily functioning is not impaired. Short term goal: Identify, challenge, and replace anxious/fearful/depressive thoughts and self-talk with more positive, realistic, and empowering thoughts and self-talk that do not support anxiety nor depression. Strategy: Review, repeat, and reinforce success.   Diagnosis:   ICD-10-CM   1. Generalized anxiety disorder  F41.1      Plan:  Patient today showing good motivation and active participation in session as she worked on her worrying, overthinking, anxiety, letting go of the need to control things, and marital relationship.  Very actively engaged in working on some suggested positive behaviors last session and good follow through.  Her "coping card" from last session has been very helpful and she plans to add to it.  Encouraged patient in her trying to practice more positive behaviors including: Beginning the process of stopping  her smoking excessively and not use it as a coping tool. Believing in herself more and her ability to make changes even of long time habits that are hurtful. Practice the active listening skills to really hear what the other person is saying.  Focusing more on letting go of trying to control things that she cannot  control. Having more quiet time for reflection and prayer as this is helpful to her. Decrease her rigid overthinking and over analyzing as to "how things should be". Paying attention to how she says what she says. Getting outside daily and walking when possible. Enjoying time with her pet dog which is very therapeutic for her. Return to her exercise at the gym. Encouraging positive self talk. Remaining involved in groups that are healthy for her especially her church groups. Staying in touch with supportive people. Remaining in the present focusing on what she can change. Becoming more self aware especially in interactions with others. Healthy boundaries with others. Recognize the strength she shows working with goal-directed behaviors towards a direction that supports improved emotional health and overall wellbeing.  Goal review and progress/challenges noted with patient.  Next appointment within 2 to 3 weeks.  This record has been created using [employer].  Chart creation errors have been sought, but may not always have been located and corrected.  Such creation errors do not reflect on the standard of medical care provided.   Shanon Ace, LCSW

## 2021-03-06 ENCOUNTER — Ambulatory Visit: Payer: Medicare PPO | Admitting: Psychiatry

## 2021-03-06 ENCOUNTER — Other Ambulatory Visit: Payer: Self-pay

## 2021-03-06 DIAGNOSIS — F411 Generalized anxiety disorder: Secondary | ICD-10-CM | POA: Diagnosis not present

## 2021-03-06 NOTE — Progress Notes (Signed)
[facility] Counselor/Therapist Progress Note  Patient ID: SHAMONE WINZER, MRN: [id number],    Date: 03/06/2021  Time Spent: 53 minutes   Treatment Type: Individual Therapy  Reported Symptoms: anxiety,obsessiveness  Mental Status Exam:  Appearance:   Casual     Behavior:  Appropriate, Sharing, and Motivated  Motor:  Normal  Speech/Language:   Normal Rate  Affect:  anxious  Mood:  anxious and depressed  Thought process:  goal directed  Thought content:    Obsessions and Rumination  Sensory/Perceptual disturbances:    WNL  Orientation:  oriented to person, place, time/date, situation, day of week, month of year, year, and stated date of Jan. 17, 2023  Attention:  Good  Concentration:  Good and Fair  Memory:  WNL  Fund of knowledge:   Good  Insight:    Good and Fair  Judgment:   Good  Impulse Control:  Good   Risk Assessment: Danger to Self:  No Self-injurious Behavior: No Danger to Others: No Duty to Warn:no Physical Aggression / Violence:No  Access to Firearms a concern: No  Gang Involvement:No   Subjective: Patient in today reporting anxiety and some obsessiveness and ruminating. States she recognizes some progress in her level of patience, being more understanding of husband and his issues, having some better family contacts and this is a work in progress. States that reading books related to confidence and prayer is helpful. Worrying has decreased and I work at more "letting go especially of things that are out of my control".  Some obsessiveness and still working on this with more intentionality. Has followed through with vitamin recommendations made by her med provider and feels it may be helpful.  Also followed through in not lying in bed late in the a.m. and rather get on up and get started with her day and be more productive. Trying to not be as addicted on her phone and be able to take a break from electronics occasionally. Struggles with her "control issues"  and we focused more on this today as that affects her personal relationships and in her daily interactions with others. Finds some particular behaviors helpful on "bad days" including: walking, praying, singing, playing piano, stay focused, ride bike, going to gym, reading more, and calling friends. Using "coping card" from prior session and finds it helpful in her mood and behaviorally. Notes some decrease in her worrying and overthinking. Her faith continues to be a strong support for her. Trying to "stop dragging negative thoughts from one day to the next." Definitely more energy towards goal-directed behaviors.  Interventions: Solution-Oriented/Positive Psychology, Ego-Supportive, and Insight-Oriented  Treatment goal plan:  Patient not signing treatment plan on computer screen due to Covid. Treatment Goals: Goals remain on treatment plan as patient works on strategies to achieve her goals.  Progress will be noted each week in "Progress" section of treatment plan. Long term goal: Reduce overall level, frequency, and intensity of the anxiety so that daily functioning is not impaired. Short term goal: Identify, challenge, and replace anxious/fearful/depressive thoughts and self-talk with more positive, realistic, and empowering thoughts and self-talk that do not support anxiety nor depression. Strategy: Review, repeat, and reinforce success.   Diagnosis:   ICD-10-CM   1. Generalized anxiety disorder  F41.1      Plan: Today showing motivation and actively participated in session working on her obsessiveness, ruminating, not holding on to the negatives, nurturing some healthier family relationships, setting limits with herself and others, letting go of  things that hold her back, trying to be more understanding of husband and some of the ways they are different from each other.  Progressing and more positive today.  Some increased confidence and this is a work in progress.  To continue positive  behaviors noted above.  Encouraged patient in her practice of more positive behaviors including: Reflecting on her progress more often. Beginning the process of stopping her smoking excessively and not using it as a coping tool. Believing in herself more in her ability to make changes of long time habits that are hurting her. Practice active listening skills to really hear what the other person is saying. Focusing more on letting go of trying to control things that she cannot really control. Having more quiet time for reflection and prayer as this is helpful to her. Decrease her rigid overthinking and over analyzing as to "how things should be". Paying attention to how she says what she says. Getting outside daily and walking when possible. Enjoying time with her pet dog which is very therapeutic for her. Return to her exercise at the gym. Encouraging positive self talk. Remaining involved in groups that are healthy for her especially her church groups. Staying in touch with supportive people. Remaining in the present focusing on what she can change or control. Becoming more self aware especially in interactions with others. Healthy boundaries with others as needed. For every negative thought create 2 positives. Feel good about the strength she shows working with goal-directed behaviors in a direction that supports improved emotional health.  Goal review and progress/challenges noted with patient.  Next appointment within 2 to 3 weeks.  This record has been created using [employer].  Chart creation errors have been sought, but may not always have been located and corrected.  Such creation errors do not reflect on the standard of medical care provided.  Shanon Ace, LCSW

## 2021-03-22 ENCOUNTER — Other Ambulatory Visit: Payer: Self-pay | Admitting: Internal Medicine

## 2021-03-27 ENCOUNTER — Other Ambulatory Visit: Payer: Self-pay | Admitting: Internal Medicine

## 2021-03-27 DIAGNOSIS — J069 Acute upper respiratory infection, unspecified: Secondary | ICD-10-CM

## 2021-03-28 ENCOUNTER — Ambulatory Visit: Payer: Medicare PPO | Admitting: Psychiatry

## 2021-03-28 ENCOUNTER — Other Ambulatory Visit: Payer: Self-pay

## 2021-03-28 DIAGNOSIS — F411 Generalized anxiety disorder: Secondary | ICD-10-CM

## 2021-03-28 NOTE — Progress Notes (Signed)
[facility] Counselor/Therapist Progress Note  Patient ID: Theresa Perez, MRN: [id number],    Date: 03/28/2021  Time Spent: 50 minutes   Treatment Type: Individual Therapy     Reported Symptoms: Anxiety, overhthinking/obsesssing         Mental Status Exam:  Appearance:   Casual     Behavior:  Appropriate, Sharing, and Motivated  Motor:  Normal  Speech/Language:   Clear and Coherent  Affect:  anxious  Mood:  anxious  Thought process:  goal directed  Thought content:    Some overthinking and obsessiveness  Sensory/Perceptual disturbances:    WNL  Orientation:  oriented to person, place, time/date, situation, day of week, month of year, year, and stated date of Feb. 8, 2023  Attention:  Good  Concentration:  Good  Memory:  WNL  Fund of knowledge:   Good  Insight:    Good and Fair  Judgment:   Good  Impulse Control:  Good   Risk Assessment: Danger to Self:  No Self-injurious Behavior: No Danger to Others: No Duty to Warn:no Physical Aggression / Violence:No  Access to Firearms a concern: No  Gang Involvement:No   Subjective:  Patient in today reporting anxiety, some obsessiveness and overthinking/obsessiveness and demonstrating some ruminating. Have been journaling more re: goals, gratitude, stressors, what's best thing happened today. Following up from last session, patient reports some progress in being more patient, trying to be more understanding with husband especially when they differ, and trying to contribute to better family contacts and interactions. Worrying "is about the same" but no worse. Less bad dreams regarding her former job. Reading books especially related to prayer and self-confidence she finds helpful. Some success gradually in letting go of things out of her control. Reports working on her obsessiveness and overthinking as this is a work in progress. Has been taking the vitamins recommended by her med provider with benefit. Admits she hasn't followed  through very well on not lying in bed late in the mornings, rather than getting up and getting started with her day and trying to be more productive. "Not been exercising as much as I should" and trying to get back into more of a routine with it. Working more with her control issues, trying not to be addicted to certain games on her phone where she has admitted to spending an excessive amount of time. Still uses coping card we worked on in a prior session to help with stressful or bad days. Directing more energy towards goal directed behaviors. Finds certain behaviors helpful and motivating including: singing, walking, praying, playing piano, focusing on her faith, riding bike, reading, calling friends, playing with her dog. Working to stop holding onto negative thoughts and be able to let go of them in order to move forward.   Interventions: Solution-Oriented/Positive Psychology and Insight-Oriented  Treatment goal plan:  Patient not signing treatment plan on computer screen due to Covid. Treatment Goals: Goals remain on treatment plan as patient works on strategies to achieve her goals.  Progress will be noted each week in "Progress" section of treatment plan. Long term goal: Reduce overall level, frequency, and intensity of the anxiety so that daily functioning is not impaired. Short term goal: Identify, challenge, and replace anxious/fearful/depressive thoughts and self-talk with more positive, realistic, and empowering thoughts and self-talk that do not support anxiety nor depression. Strategy: Review, repeat, and reinforce success  Diagnosis:   ICD-10-CM   1. Generalized anxiety disorder  F41.1  Plan: Patient today showing motivation and active participation in session as we worked on her anxiety, obsessiveness, overthinking, ruminating, and following up on goal-directed behaviors. More motivated even though she still recognizes some difficulty in working on certain goals/behavioral  changes. Continues to try and nuture healthier marital/family relationships, trying to  be more understanding of husband "and some of his differences", and letting go of thing that hold her back from moving forward. Feels her confidence is increasing gradually, and is sleeping better.Encouraged patient in her practice of more positive behaviors including: Recognizing and reflecting on her progress more often. Believing in herself more in her ability to make changes of long time habits that are hurting her. Beginning the process of stopping excessive smoking and not using it as a coping tool. Practice active listening skills to really hear what the other person is saying. More on letting go of trying to control things that she really cannot control. Having more quiet time for reflection and prayer as this is helpful to her. Decrease her rigid overthinking and over analyzing as to "how things should be". Paying attention to how she says what she says. Getting outside daily and walking when possible. Enjoying time with her pet dog which is very therapeutic for her. Return to her exercise at the gym. Encouraging positive self talk. Remaining involved in groups that are healthy for her especially her church groups. Staying in touch with supportive people. Remaining in the present focusing on what she can change or control. Become more self aware especially in interactions with others. Healthy boundaries with others as needed. For every negative thought create 2 positives. Recognize the strengths she shows working with goal-directed behaviors in a direction that supports improved emotional health.  Goal review and progress/challenges noted with patient.  Next appointment within 2 to 3 weeks.  This record has been created using [employer].  Chart creation errors have been sought, but may not always have been located and corrected.  Such creation errors do not reflect on the standard of medical  care provided.  Shanon Ace, LCSW

## 2021-03-29 ENCOUNTER — Other Ambulatory Visit: Payer: Self-pay | Admitting: Internal Medicine

## 2021-03-29 DIAGNOSIS — E890 Postprocedural hypothyroidism: Secondary | ICD-10-CM

## 2021-04-17 ENCOUNTER — Ambulatory Visit: Payer: Medicare PPO | Admitting: Psychiatry

## 2021-04-26 ENCOUNTER — Other Ambulatory Visit: Payer: Self-pay | Admitting: Internal Medicine

## 2021-05-03 ENCOUNTER — Ambulatory Visit: Payer: Medicare PPO | Admitting: Psychiatry

## 2021-05-03 ENCOUNTER — Other Ambulatory Visit: Payer: Self-pay

## 2021-05-03 DIAGNOSIS — F411 Generalized anxiety disorder: Secondary | ICD-10-CM

## 2021-05-03 NOTE — Progress Notes (Signed)
?    [facility] Counselor/Therapist Progress Note ? ?Patient ID: Theresa Perez, MRN: [id number],   ? ?Date: 05/03/2021 ? ?Time Spent: 55 minutes  ? ?Treatment Type: Individual Therapy ? ?Reported Symptoms: anxiety, less depression ? ?Mental Status Exam: ? ?Appearance:   Casual     ?Behavior:  Appropriate, Sharing, and Motivated  ?Motor:  Normal  ?Speech/Language:   Clear and Coherent  ?Affect:  anxious  ?Mood:  anxious  ?Thought process:  normal  ?Thought content:    overthinking  ?Sensory/Perceptual disturbances:    WNL  ?Orientation:  oriented to person, place, time/date, situation, day of week, month of year, year, and stated date of May 03, 2021  ?Attention:  Good  ?Concentration:  Good  ?Memory:  WNL  ?Fund of knowledge:   Good  ?Insight:    Good and Fair  ?Judgment:   Good  ?Impulse Control:  Good  ? ?Risk Assessment: ?Danger to Self:  No ?Self-injurious Behavior: No ?Danger to Others: No ?Duty to Warn:no ?Physical Aggression / Violence:No  ?Access to Firearms a concern: No  ?Gang Involvement:No  ? ?Subjective:   Patient in today reporting anxiety and less depression. Observed symptoms include obsessiveness, some ruminating, and obsessiveness. Using journaling some as a tool "but I need to use it more regularly."  Journaling re: her gratitude, goals, stressors, and what's the best thing that happened today. Really enjoys music and her church choir activities.  Even considering refresher piano lessons. Reading meditative books and listening to music, she finds therapeutic. Worrying "still some but not as much as previously. Reporting some continued progress in using more patience, working to be more understanding with husband especially at times with they differ in opinions, and trying to be a part of better interactions within her family. Reporting that her "bad dreams about her former job" have significantly decreased. Finds books related to self-confidence and prayers very helpful. Sadness shared about  the "early dementia" of a long time friend. Focused some more today on her obsessive thinking as she's working to decrease the obsessiveness and maintain the decrease. Progressing also in her getting up at a reasonable time rather than just lying in bed; feels this improves her productivity. Is exercising and wants to get on more of a schedule and is working on this. Encourage continued use of coping card particularly on stressful or bad days. Better focus and follow through on goal-directed behaviors.Behaviors that help my motivation include praying, piano playing, walking, singing, and riding bike. ? ?Interventions: Ego-Supportive and Insight-Oriented ? ?Treatment goal plan:  ?Patient not signing treatment plan on computer screen due to Covid. ?Treatment Goals: ?Goals remain on treatment plan as patient works on strategies to achieve her goals.  Progress will be noted each week in "Progress" section of treatment plan. ?Long term goal: ?Reduce overall level, frequency, and intensity of the anxiety so that daily functioning is not impaired. ?Short term goal: ?Identify, challenge, and replace anxious/fearful/depressive thoughts and self-talk with more positive, realistic, and empowering thoughts and self-talk that do not support anxiety nor depression. ?Strategy: ?Review, repeat, and reinforce success ? ? ?Diagnosis: ?  ICD-10-CM   ?1. Generalized anxiety disorder  F41.1   ?  ? ?Plan:  Patient today showing motivation and participated well in session as she focused more on her overthinking, anxiety, ruminating, obsessiveness, and following up on goal-directed behaviors. More goal-focused even though still having difficulty working to make changes per tx goal plan. Some fears expressed re: "the world and all the  awful things that go on", and patient encourage not to dwell a lot on news that feeds her fears. Encourage to spend more time in meaningful activities and with supportive friends. ?Encouraged patient in her  practice of more positive behaviors including: ?Believing in herself more and her ability to make changes of long time habits that hold her back. ?Recognize and reflect on her progress daily. ?Practice active listening skills to really hear what other people are saying. ?Begin the process of stopping excessive smoking and not using it as a coping tool. ?Work to let go of trying to control things that she really cannot control. ?Having more quiet time for reflection and prayer as this is helpful to her. ?Decrease her rigid overthinking over analyzing as to "how things should be". ?Paying attention to how she says what she says. ?Getting outside daily and walking when possible. ?Enjoying time with her pet dog which is very therapeutic for her. ?Return to her exercise at the gym. ?Encouraging positive self talk. ?Remaining involved in groups that are healthy for her especially her church groups. ?Staying in touch with supportive people. ?Remaining in the present focusing on what she can change or control. ?Healthy boundaries with others as needed. ?Become more self aware especially in interactions with others. ?For every negative thought create 2 positives. ?Realize the strength she shows working with goal-directed behaviors in a direction that supports her improved emotional health. ? ?Goal review and progress/challenges noted with patient. ? ?Next appointment within 2 to 3 weeks. ? ?This record has been created using [employer].  Chart creation errors have been sought, but may not always have been located and corrected.  Such creation errors do not reflect on the standard of medical care provided. ? ? ?Shanon Ace, LCSW ? ? ? ? ? ? ? ? ? ? ? ? ? ? ? ? ? ? ?

## 2021-05-11 ENCOUNTER — Other Ambulatory Visit: Payer: Self-pay | Admitting: Internal Medicine

## 2021-05-11 DIAGNOSIS — F4322 Adjustment disorder with anxiety: Secondary | ICD-10-CM

## 2021-05-11 DIAGNOSIS — I10 Essential (primary) hypertension: Secondary | ICD-10-CM

## 2021-05-24 ENCOUNTER — Ambulatory Visit: Payer: Medicare PPO | Admitting: Psychiatry

## 2021-05-24 DIAGNOSIS — F411 Generalized anxiety disorder: Secondary | ICD-10-CM | POA: Diagnosis not present

## 2021-05-24 NOTE — Progress Notes (Signed)
?    [facility] Counselor/Therapist Progress Note ? ?Patient ID: Theresa Perez, MRN: [id number],   ? ?Date: 05/24/2021 ? ?Time Spent: 50 minutes ? ?Treatment Type: Individual Therapy ? ?Reported Symptoms: anxiety, overthinking ? ?Mental Status Exam: ? ?Appearance:   Casual     ?Behavior:  Appropriate, Sharing, and Motivated  ?Motor:  Normal  ?Speech/Language:   Clear and Coherent  ?Affect:  anxious  ?Mood:  anxious  ?Thought process:  goal directed  ?Thought content:    Overthinking, some obsessiveness  ?Sensory/Perceptual disturbances:    WNL  ?Orientation:  oriented to person, place, time/date, situation, day of week, month of year, year, and stated date of May 24, 2021  ?Attention:  Good  ?Concentration:  Good and Fair  ?Memory:  WNL  ?Fund of knowledge:   Good  ?Insight:    Good and Fair  ?Judgment:   Good  ?Impulse Control:  Good  ? ?Risk Assessment: ?Danger to Self:  No ?Self-injurious Behavior: No ?Danger to Others: No ?Duty to Warn:no ?Physical Aggression / Violence:No  ?Access to Firearms a concern: No  ?Gang Involvement:No  ? ?Subjective:  Patient in today reporting anxiety as her primary symptom. Tendency to over-talk, but not manic-like. Recent trip with family to beach and enjoyed that. Continues active involvement in her church. "No depression recently."  Worked today further on her anxiety especially within the family where frequent conflicts can occur.  Patient shared several specific examples that have happened recently which was helpful in looking at different ways of managing those situations that could have been more productive for her and others involved, rather than it just leading to anger and frustration.  Continues to use journaling as a tool between sessions and shares some of her writing in session today relating to stressors, goals, and "best thing that happened today."  Some ruminating, obsessiveness. Using some gratitude listing as well and shared today.  Encouraged her to keep on  using her journaling and also reflect on that journaling, which she agrees would be helpful for her.  Also using meditation books and music that "helps calm her self and her mood ".  Self-reports that her worrying has reduced.  Also no further bad dreams.  Continued work on her obsessive thoughts today again using specific examples as they happened in session which is helpful to patient, but hard for her to maintain any change.  Shares that this "is a long time habit that she has had" and that "I am often told that I go off on tangents or go round and round with issues".  Did stick with some interventions today in session to try and interrupt those patterns.  Encouraged patient again today with the use of her "coping card" and especially when she is having a more stressful day or challenging day.  States that playing the piano, walking, singing, riding her bike, and praying all contribute to her having better motivation and mood, and she was encouraged her to continue these behaviors. ? ? ?Interventions: Cognitive Behavioral Therapy and Insight-Oriented ?  ?Treatment goal plan:  ?Patient not signing treatment plan on computer screen due to Covid. ?Treatment Goals: ?Goals remain on treatment plan as patient works on strategies to achieve her goals.  Progress will be noted each week in "Progress" section of treatment plan. ?Long term goal: ?Reduce overall level, frequency, and intensity of the anxiety so that daily functioning is not impaired. ?Short term goal: ?Identify, challenge, and replace anxious/fearful/depressive thoughts and self-talk with more  positive, realistic, and empowering thoughts and self-talk that do not support anxiety nor depression. ?Strategy: ?Review, repeat, and reinforce success ? ?Diagnosis: ?  ICD-10-CM   ?1. Generalized anxiety disorder  F41.1   ?  ? ?Plan:  Patient today showing motivation and active participation in session focusing on her anxiety, overthinking, and obsessiveness. Worked  with her goals as it is hard for her to keep focus on her goals and easy to "go off on tangents and everybody tells me that."  Some interpersonal issues more recently with adult grand-daughter B, which she processed today and discussed some strategies for her to better handle situations when B "mistreats me verbally" and how to establish healthier boundaries especially within the family. Difficulty making changes and sticking with them over time. Trying to make and maintain some gains in her therapy work and homework. Encouraged patient to remain more involved with friends and in activities she enjoys with friends and in her church.  ?Encouraged patient in practicing more positive behaviors including:  ?Recognize and reflect on her progress daily. ?Believe in herself more and her ability to make changes of long time habits that hold her back. ?Practice active listening skills to really hear what other people say before responding. ?Begin the process of stopping excessive smoking and not using it as a coping tool. ?Work on letting go of trying to control things that she really cannot control. ?Have more quiet time for reflection and prayer as this has been helpful to her in the past. ?Decrease rigid overthinking and over analyzing of "how things should be". ?Pay attention to how she says what she says. ?Getting outside daily and walking as possible. ?Enjoying time with her pet dog which is very therapeutic for her. ?Return to her exercise at the gym. ?Encouraging positive self talk. ?Remaining involved in groups that are healthy for her especially her church groups. ?Staying in touch with supportive people. ?Stay in the present focusing on what she can control or change. ?Healthy boundaries with others. ?Become more self aware especially in interactions with others. ?Recognize the strength she shows as she works with goal-directed behaviors in a direction that supports her improved emotional health and overall  wellbeing. ? ?Goal review and progress/challenges noted with patient. ? ?Next appointment within 3 weeks. ? ?This record has been created using [employer].  Chart creation errors have been sought, but may not always have been located and corrected.  Such creation errors do not reflect on the standard of medical care provided. ? ? ?Shanon Ace, LCSW ? ? ? ? ? ? ? ? ? ? ? ? ? ? ? ? ? ? ?

## 2021-06-07 ENCOUNTER — Telehealth: Payer: Self-pay | Admitting: Internal Medicine

## 2021-06-07 NOTE — Telephone Encounter (Signed)
N/A unable to leave a message for patient to call back to schedule Medicare Annual Wellness Visit  ? ?Last AWV  08/16/19 ? ?Please schedule at anytime with [facility] if patient calls the office back.   ? ? ? ?Any questions, please call me at [phone]  ?

## 2021-06-12 ENCOUNTER — Ambulatory Visit (INDEPENDENT_AMBULATORY_CARE_PROVIDER_SITE_OTHER): Payer: Medicare PPO

## 2021-06-12 ENCOUNTER — Ambulatory Visit: Payer: Medicare PPO | Admitting: Psychiatry

## 2021-06-12 DIAGNOSIS — Z0000 Encounter for general adult medical examination without abnormal findings: Secondary | ICD-10-CM | POA: Diagnosis not present

## 2021-06-12 DIAGNOSIS — F411 Generalized anxiety disorder: Secondary | ICD-10-CM | POA: Diagnosis not present

## 2021-06-12 NOTE — Patient Instructions (Signed)
Ms. Gaul , ?Thank you for taking time to come for your Medicare Wellness Visit. I appreciate your ongoing commitment to your health goals. Please review the following plan we discussed and let me know if I can assist you in the future.  ? ?Screening recommendations/referrals: ?Colonoscopy: Never done; will speak with provider to schedule at next visit ?Mammogram: 10/04/2020; due every year ?Bone Density: 06/03/2017; due every 5 years ?Recommended yearly ophthalmology/optometry visit for glaucoma screening and checkup ?Recommended yearly dental visit for hygiene and checkup ? ?Vaccinations: ?Influenza vaccine: 12/23/2020 ?Pneumococcal vaccine: 12/08/2013, 05/26/2017 ?Tdap vaccine: 12/28/2015; due every 10 years ?Shingles vaccine: 09/08/2017, 01/12/2018   ?Covid-19: 06/22/2019, 01/07/2020 ? ?Advanced directives: Advance directive discussed with you today. I have provided a copy for you to complete at home and have notarized. Once this is complete please bring a copy in to our office so we can scan it into your chart. ? ?Conditions/risks identified: Yes ? ?Next appointment: Please schedule your next Medicare Wellness Visit with your Nurse Health Advisor in 1 year by calling [phone]. ? ? ?Preventive Care 22 Years and Older, Female ?Preventive care refers to lifestyle choices and visits with your health care provider that can promote health and wellness. ?What does preventive care include? ?A yearly physical exam. This is also called an annual well check. ?Dental exams once or twice a year. ?Routine eye exams. Ask your health care provider how often you should have your eyes checked. ?Personal lifestyle choices, including: ?Daily care of your teeth and gums. ?Regular physical activity. ?Eating a healthy diet. ?Avoiding tobacco and drug use. ?Limiting alcohol use. ?Practicing safe sex. ?Taking low-dose aspirin every day. ?Taking vitamin and mineral supplements as recommended by your health care provider. ?What happens  during an annual well check? ?The services and screenings done by your health care provider during your annual well check will depend on your age, overall health, lifestyle risk factors, and family history of disease. ?Counseling  ?Your health care provider may ask you questions about your: ?Alcohol use. ?Tobacco use. ?Drug use. ?Emotional well-being. ?Home and relationship well-being. ?Sexual activity. ?Eating habits. ?History of falls. ?Memory and ability to understand (cognition). ?Work and work Statistician. ?Reproductive health. ?Screening  ?You may have the following tests or measurements: ?Height, weight, and BMI. ?Blood pressure. ?Lipid and cholesterol levels. These may be checked every 5 years, or more frequently if you are over 23 years old. ?Skin check. ?Lung cancer screening. You may have this screening every year starting at age 33 if you have a 30-pack-year history of smoking and currently smoke or have quit within the past 15 years. ?Fecal occult blood test (FOBT) of the stool. You may have this test every year starting at age 28. ?Flexible sigmoidoscopy or colonoscopy. You may have a sigmoidoscopy every 5 years or a colonoscopy every 10 years starting at age 29. ?Hepatitis C blood test. ?Hepatitis B blood test. ?Sexually transmitted disease (STD) testing. ?Diabetes screening. This is done by checking your blood sugar (glucose) after you have not eaten for a while (fasting). You may have this done every 1-3 years. ?Bone density scan. This is done to screen for osteoporosis. You may have this done starting at age 84. ?Mammogram. This may be done every 1-2 years. Talk to your health care provider about how often you should have regular mammograms. ?Talk with your health care provider about your test results, treatment options, and if necessary, the need for more tests. ?Vaccines  ?Your health care provider may recommend certain  vaccines, such as: ?Influenza vaccine. This is recommended every  year. ?Tetanus, diphtheria, and acellular pertussis (Tdap, Td) vaccine. You may need a Td booster every 10 years. ?Zoster vaccine. You may need this after age 34. ?Pneumococcal 13-valent conjugate (PCV13) vaccine. One dose is recommended after age 76. ?Pneumococcal polysaccharide (PPSV23) vaccine. One dose is recommended after age 66. ?Talk to your health care provider about which screenings and vaccines you need and how often you need them. ?This information is not intended to replace advice given to you by your health care provider. Make sure you discuss any questions you have with your health care provider. ?Document Released: 03/03/2015 Document Revised: 10/25/2015 Document Reviewed: 12/06/2014 ?Elsevier Interactive Patient Education ? 2017 [employer]. ? ?Fall Prevention in the Home ?Falls can cause injuries. They can happen to people of all ages. There are many things you can do to make your home safe and to help prevent falls. ?What can I do on the outside of my home? ?Regularly fix the edges of walkways and driveways and fix any cracks. ?Remove anything that might make you trip as you walk through a door, such as a raised step or threshold. ?Trim any bushes or trees on the path to your home. ?Use bright outdoor lighting. ?Clear any walking paths of anything that might make someone trip, such as rocks or tools. ?Regularly check to see if handrails are loose or broken. Make sure that both sides of any steps have handrails. ?Any raised decks and porches should have guardrails on the edges. ?Have any leaves, snow, or ice cleared regularly. ?Use sand or salt on walking paths during winter. ?Clean up any spills in your garage right away. This includes oil or grease spills. ?What can I do in the bathroom? ?Use night lights. ?Install grab bars by the toilet and in the tub and shower. Do not use towel bars as grab bars. ?Use non-skid mats or decals in the tub or shower. ?If you need to sit down in the shower, use a  plastic, non-slip stool. ?Keep the floor dry. Clean up any water that spills on the floor as soon as it happens. ?Remove soap buildup in the tub or shower regularly. ?Attach bath mats securely with double-sided non-slip rug tape. ?Do not have throw rugs and other things on the floor that can make you trip. ?What can I do in the bedroom? ?Use night lights. ?Make sure that you have a light by your bed that is easy to reach. ?Do not use any sheets or blankets that are too big for your bed. They should not hang down onto the floor. ?Have a firm chair that has side arms. You can use this for support while you get dressed. ?Do not have throw rugs and other things on the floor that can make you trip. ?What can I do in the kitchen? ?Clean up any spills right away. ?Avoid walking on wet floors. ?Keep items that you use a lot in easy-to-reach places. ?If you need to reach something above you, use a strong step stool that has a grab bar. ?Keep electrical cords out of the way. ?Do not use floor polish or wax that makes floors slippery. If you must use wax, use non-skid floor wax. ?Do not have throw rugs and other things on the floor that can make you trip. ?What can I do with my stairs? ?Do not leave any items on the stairs. ?Make sure that there are handrails on both sides of the stairs  and use them. Fix handrails that are broken or loose. Make sure that handrails are as long as the stairways. ?Check any carpeting to make sure that it is firmly attached to the stairs. Fix any carpet that is loose or worn. ?Avoid having throw rugs at the top or bottom of the stairs. If you do have throw rugs, attach them to the floor with carpet tape. ?Make sure that you have a light switch at the top of the stairs and the bottom of the stairs. If you do not have them, ask someone to add them for you. ?What else can I do to help prevent falls? ?Wear shoes that: ?Do not have high heels. ?Have rubber bottoms. ?Are comfortable and fit you  well. ?Are closed at the toe. Do not wear sandals. ?If you use a stepladder: ?Make sure that it is fully opened. Do not climb a closed stepladder. ?Make sure that both sides of the stepladder are locked into place. ?Ask some

## 2021-06-12 NOTE — Progress Notes (Signed)
?I connected with Theresa Perez today by telephone and verified that I am speaking with the correct person using two identifiers. ?Location patient: home ?Location provider: work ?Persons participating in the virtual visit: patient, provider. ?  ?I discussed the limitations, risks, security and privacy concerns of performing an evaluation and management service by telephone and the availability of in person appointments. I also discussed with the patient that there may be a patient responsible charge related to this service. The patient expressed understanding and verbally consented to this telephonic visit.  ?  ?Interactive audio and video telecommunications were attempted between this provider and patient, however failed, due to patient having technical difficulties OR patient did not have access to video capability.  We continued and completed visit with audio only. ? ?Some vital signs may be absent or patient reported.  ? ?Time Spent with patient on telephone encounter: 30 minutes ? ?Subjective:  ? Theresa Perez is a 71 y.o. female who presents for Medicare Annual (Subsequent) preventive examination. ? ?Review of Systems    ? ?Cardiac Risk Factors include: advanced age (>48mn, >>65women);family history of premature cardiovascular disease;hypertension;smoking/ tobacco exposure ? ?   ?Objective:  ?  ?There were no vitals filed for this visit. ?There is no height or weight on file to calculate BMI. ? ? ?  06/12/2021  ?  8:50 AM 12/11/2020  ?  1:36 PM 08/16/2019  ? 10:28 AM 11/17/2013  ?  2:46 PM 03/31/2012  ?  2:38 PM 03/31/2012  ?  9:17 AM 03/27/2012  ? 11:35 AM  ?Advanced Directives  ?Does Patient Have a Medical Advance Directive? No No No No Patient does not have advance directive Patient does not have advance directive Patient does not have advance directive  ?Would patient like information on creating a medical advance directive? Yes (ED - Information included in AVS)  Yes (MAU/Ambulatory/Procedural Areas -  Information given)      ?Pre-existing out of facility DNR order (yellow form or pink MOST form)     No No   ? ? ?Current Medications (verified) ?Outpatient Encounter Medications as of 06/12/2021  ?Medication Sig  ? albuterol (VENTOLIN HFA) 108 (90 Base) MCG/ACT inhaler Inhale 2 puffs into the lungs every 6 (six) hours as needed for wheezing or shortness of breath.  ? Ascorbic Acid (VITAMIN C PO) Take 1 tablet by mouth daily.  ? atenolol-chlorthalidone (TENORETIC) 50-25 MG tablet Take 1/2 (one-half) tablet by mouth once daily  ? Black Cohosh 540 MG CAPS Take 540 mg by mouth 2 (two) times daily.   ? buPROPion (WELLBUTRIN XL) 150 MG 24 hr tablet Take 1 tablet (150 mg total) by mouth daily.  ? butalbital-acetaminophen-caffeine (ESGIC) 50-325-40 MG tablet Take 1 tablet by mouth 2 (two) times daily as needed for headache.  ? CALCIUM PO Take 1 tablet by mouth daily.  ? COENZYME Q-10 PO Take 1 tablet by mouth daily.  ? Cyanocobalamin (VITAMIN B-12 PO) Take 1 tablet by mouth daily.  ? DULoxetine (CYMBALTA) 60 MG capsule Take 1 capsule by mouth twice daily  ? ECHINACEA EXTRACT PO Take by mouth.  ? EVENING PRIMROSE OIL PO Take by mouth.  ? ferrous sulfate 325 (65 FE) MG tablet Take 325 mg by mouth every evening.   ? fluticasone (FLONASE) 50 MCG/ACT nasal spray Use 2 spray(s) in each nostril once daily  ? Ginkgo Biloba (GNP GINGKO BILOBA EXTRACT PO) Take 1 tablet by mouth daily.  ? levothyroxine (SYNTHROID) 100 MCG tablet Take 1 tablet  by mouth once daily  ? LORazepam (ATIVAN) 0.5 MG tablet TAKE 1 TABLET BY MOUTH ONCE DAILY AS NEEDED FOR ANXIETY  ? Misc Natural Products (GLUCOSAMINE CHOND COMPLEX/MSM) TABS Take 1 tablet by mouth 2 (two) times daily.  ? Multiple Vitamins-Minerals (MULTIVITAMIN WITH MINERALS) tablet Take 1 tablet by mouth daily.  ? Nutritional Supplements (DHEA PO) Take 1 tablet by mouth daily.  ? predniSONE (DELTASONE) 20 MG tablet Take 1 tablet (20 mg total) by mouth daily with breakfast.  ? triamcinolone cream  (KENALOG) 0.1 % Apply 1 application topically 2 (two) times daily.  ? Turmeric (QC TUMERIC COMPLEX PO) Take by mouth.  ? ?No facility-administered encounter medications on file as of 06/12/2021.  ? ? ?Allergies (verified) ?Permethrin  ? ?History: ?Past Medical History:  ?Diagnosis Date  ? Allergy   ? Anxiety   ? Cancer [facility])   ? Thyroid-Papillary  ? Complication of anesthesia   ? Cyst of breast, right, diffuse fibrocystic   ? Hypertension   ? Hypothyroidism   ? PONV (postoperative nausea and vomiting)   ? ?Past Surgical History:  ?Procedure Laterality Date  ? ANTERIOR AND POSTERIOR REPAIR N/A 03/31/2012  ? Procedure: ANTERIOR (CYSTOCELE) AND POSTERIOR REPAIR (RECTOCELE);  Surgeon: Gus Height, MD;  Location: [facility];  Service: Gynecology;  Laterality: N/A;  ? aspiration cyst right breast    ? THYROIDECTOMY  1997  ? TONSILLECTOMY  1969  ? uterine ablation    ? VAGINAL HYSTERECTOMY N/A 03/31/2012  ? Procedure: HYSTERECTOMY VAGINAL;  Surgeon: Gus Height, MD;  Location: [facility];  Service: Gynecology;  Laterality: N/A;  ? ?Family History  ?Problem Relation Age of Onset  ? Diabetes Mother   ? Deep vein thrombosis Mother   ?     Varicose Veins  ? Cancer Father   ?     Father died from brain tumor.  ? Heart disease Father   ? Colon cancer Neg Hx   ? Stomach cancer Neg Hx   ? ?Social History  ? ?Socioeconomic History  ? Marital status: Married  ?  Spouse name: Not on file  ? Number of children: 1  ? Years of education: 3  ? Highest education level: Not on file  ?Occupational History  ? Not on file  ?Tobacco Use  ? Smoking status: Every Day  ?  Packs/day: 0.30  ?  Years: 15.00  ?  Pack years: 4.50  ?  Types: Cigarettes  ? Smokeless tobacco: Never  ? Tobacco comments:  ?  Occassional smoker  ?Substance and Sexual Activity  ? Alcohol use: Yes  ?  Comment: Rarely  ? Drug use: No  ? Sexual activity: Yes  ?  Birth control/protection: Post-menopausal  ?Other Topics Concern  ? Not on file  ?Social History Narrative  ? Left handed  ?  Drinks caffeine  ? Two story home  ? ?Social Determinants of Health  ? ?Financial Resource Strain: Low Risk   ? Difficulty of Paying Living Expenses: Not hard at all  ?Food Insecurity: No Food Insecurity  ? Worried About Charity fundraiser in the Last Year: Never true  ? Ran Out of Food in the Last Year: Never true  ?Transportation Needs: No Transportation Needs  ? Lack of Transportation (Medical): No  ? Lack of Transportation (Non-Medical): No  ?Physical Activity: Sufficiently Active  ? Days of Exercise per Week: 5 days  ? Minutes of Exercise per Session: 30 min  ?Stress: No Stress Concern Present  ? Feeling  of Stress : Not at all  ?Social Connections: Socially Integrated  ? Frequency of Communication with Friends and Family: More than three times a week  ? Frequency of Social Gatherings with Friends and Family: More than three times a week  ? Attends Religious Services: More than 4 times per year  ? Active Member of Clubs or Organizations: Yes  ? Attends Archivist Meetings: More than 4 times per year  ? Marital Status: Married  ? ? ?Tobacco Counseling ?Ready to quit: Not Answered ?Counseling given: Not Answered ?Tobacco comments: Occassional smoker ? ? ?Clinical Intake: ? ?Pre-visit preparation completed: Yes ? ?Pain : No/denies pain ? ?  ? ?Nutritional Risks: None ?Diabetes: No ? ?How often do you need to have someone help you when you read instructions, pamphlets, or other written materials from your doctor or pharmacy?: 1 - Never ?What is the last grade level you completed in school?: Associate's Degree ? ?Diabetic? no ? ?Interpreter Needed?: No ? ?Information entered by :: Lisette Abu, LPN. ? ? ?Activities of Daily Living ? ?  06/12/2021  ?  8:57 AM 12/11/2020  ?  9:52 AM  ?In your present state of health, do you have any difficulty performing the following activities:  ?Hearing? 1 0  ?Vision? 0 0  ?Difficulty concentrating or making decisions? 0 0  ?Walking or climbing stairs? 0 0  ?Dressing  or bathing? 0 0  ?Doing errands, shopping? 0 0  ?Preparing Food and eating ? N   ?Using the Toilet? N   ?In the past six months, have you accidently leaked urine? Y   ?Comment no protection   ?Do you have pro

## 2021-06-12 NOTE — Progress Notes (Signed)
?    [facility] Counselor/Therapist Progress Note ? ?Patient ID: Theresa Perez, MRN: [id number],   ? ?Date: 06/12/2021 ? ?Time Spent: 50 minutes  ? ?Treatment Type: Individual Therapy ? ?Reported Symptoms: anxiety (decreased) ? ?Mental Status Exam: ? ?Appearance:   Casual     ?Behavior:  Appropriate, Sharing, and Motivated  ?Motor:  Normal  ?Speech/Language:   Clear and Coherent  ?Affect:  anxious  ?Mood:  anxious  ?Thought process:  goal directed  ?Thought content:    Some obsessive thoughts and overthinking  ?Sensory/Perceptual disturbances:    WNL  ?Orientation:  oriented to person, place, time/date, situation, day of week, month of year, year, and stated date of June 12, 2021  ?Attention:  Good  ?Concentration:  Good and Fair  ?Memory:  WNL  ?Fund of knowledge:   Good  ?Insight:    Good and Fair  ?Judgment:   Good  ?Impulse Control:  Good  ? ?Risk Assessment: ?Danger to Self:  No ?Self-injurious Behavior: No ?Danger to Others: No ?Duty to Warn:no ?Physical Aggression / Violence:No  ?Access to Firearms a concern: No  ?Gang Involvement:No  ? ?Subjective:  Patient in today reporting anxiety, some overthinking, some noticeable overtalking, but not manic-like, and continued "control" issues with adult grand-daughter. Feels her anxiety is some better, and depression has definitely improved within past 3 weeks. Been active in projects at home outside, going on walks with husband, still very active at her church. Very little negativity, mood is better, even with her anxiety. Shared several examples of some recent family issues and how she responded better in the moment. Not using journaling as much now and feels she doesn't really need it at this point. Still some ruminating and obsessive thoughts. States she has become more understanding of her husband and their differences which has helped. Meditation book she finds helpful. Reports worrying has reduced. No further bad dreams. Feels that walking, playing the  piano, riding her bike and frequent praying are all helpful to her emotionally.  ? ?Interventions: Cognitive Behavioral Therapy, Solution-Oriented/Positive Psychology, and Insight-Oriented ? ?Treatment goal plan:  ?Patient not signing treatment plan on computer screen due to Covid. ?Treatment Goals: ?Goals remain on treatment plan as patient works on strategies to achieve her goals.  Progress will be noted each week in "Progress" section of treatment plan. ?Long term goal: ?Reduce overall level, frequency, and intensity of the anxiety so that daily functioning is not impaired. ?Short term goal: ?Identify, challenge, and replace anxious/fearful/depressive thoughts and self-talk with more positive, realistic, and empowering thoughts and self-talk that do not support anxiety nor depression. ?Strategy: ?Review, repeat, and reinforce success ? ?Diagnosis: ?  ICD-10-CM   ?1. Generalized anxiety disorder  F41.1   ?  ? ?Plan:  Patient today showing good motivation and actively participated in session focusing on her anxiety, overthinking, some "over-talking", and some "control" issues with adult grand-daughter.  Worked with several specific examples within the family of situations or patient was trying to manage them more effectively and with less anxiety, and finds that she is becoming better able to do this.  Also less judgmental of husband and trying to accept some of his differences.  Definitely more positive overall and still working on treatment goal directed behaviors.  Trying to work on "letting go" of certain behaviors of other people that irritate her.  Difficulty making changes and maintaining them however seems to be doing more that most recently.  Encouraged patient and her active involvement at church  and in activities with her friends. Encouraged patient in more regular practice of positive behaviors including: Believing herself more and her ability to make changes of long time habits that hold her back,  recognize and reflect on progress daily, practice active listening skills to really hear what people say before responding, begin the process of stopping excessive smoking and not using it as a coping tool, work on letting go of trying to control things that she really cannot control, have more time for reflection and prayer as this has been helpful to her in the past, decrease rigid overthinking and over analyzing of "how things should be", pay attention to how she says what she says, getting outside daily and walking, enjoying time with her pet dog which is very therapeutic for her, return to her exercise at the gym, encourage positive self talk, remaining involved in groups that are healthy for her especially her church groups, staying in touch with supportive people, stay in the present focusing on what she can control or change, healthy boundaries with others, become more self aware especially in interactions with others, and realize the strength she shows as she works with goal-directed behaviors in a direction that supports her improved emotional health. ? ?Review and progress/challenges noted with patient. ? ?Next appointment within 3 weeks. ? ?This record has been created using [employer].  Chart creation errors have been sought, but may not always have been located and corrected.  Such creation errors do not reflect on the standard of medical care provided. ? ? ?Shanon Ace, LCSW ? ? ? ? ? ? ? ? ? ? ? ? ? ? ? ? ? ? ?

## 2021-06-13 ENCOUNTER — Encounter: Payer: Self-pay | Admitting: Internal Medicine

## 2021-06-13 ENCOUNTER — Ambulatory Visit (INDEPENDENT_AMBULATORY_CARE_PROVIDER_SITE_OTHER): Payer: Medicare PPO | Admitting: Internal Medicine

## 2021-06-13 VITALS — BP 124/88 | HR 75 | Resp 18 | Ht 71.0 in | Wt 177.4 lb

## 2021-06-13 DIAGNOSIS — F4323 Adjustment disorder with mixed anxiety and depressed mood: Secondary | ICD-10-CM

## 2021-06-13 DIAGNOSIS — E7800 Pure hypercholesterolemia, unspecified: Secondary | ICD-10-CM

## 2021-06-13 DIAGNOSIS — I10 Essential (primary) hypertension: Secondary | ICD-10-CM

## 2021-06-13 DIAGNOSIS — C73 Malignant neoplasm of thyroid gland: Secondary | ICD-10-CM | POA: Diagnosis not present

## 2021-06-13 DIAGNOSIS — J410 Simple chronic bronchitis: Secondary | ICD-10-CM

## 2021-06-13 DIAGNOSIS — Z0000 Encounter for general adult medical examination without abnormal findings: Secondary | ICD-10-CM

## 2021-06-13 DIAGNOSIS — Z1211 Encounter for screening for malignant neoplasm of colon: Secondary | ICD-10-CM | POA: Diagnosis not present

## 2021-06-13 LAB — LIPID PANEL
Cholesterol: 198 mg/dL (ref 0–200)
HDL: 53.3 mg/dL (ref >39.00)
LDL Cholesterol: 124 mg/dL — ABNORMAL HIGH (ref 0–99)
NonHDL: 144.4
Total CHOL/HDL Ratio: 4
Triglycerides: 104 mg/dL (ref 0.0–149.0)
VLDL: 20.8 mg/dL (ref 0.0–40.0)

## 2021-06-13 LAB — CBC
HCT: 41 % (ref 36.0–46.0)
Hemoglobin: 13.9 g/dL (ref 12.0–15.0)
MCHC: 33.8 g/dL (ref 30.0–36.0)
MCV: 89.9 fl (ref 78.0–100.0)
Platelets: 315 10*3/uL (ref 150.0–400.0)
RBC: 4.57 Mil/uL (ref 3.87–5.11)
RDW: 12.6 % (ref 11.5–15.5)
WBC: 5.5 10*3/uL (ref 4.0–10.5)

## 2021-06-13 LAB — COMPREHENSIVE METABOLIC PANEL
ALT: 40 U/L — ABNORMAL HIGH (ref 0–35)
AST: 45 U/L — ABNORMAL HIGH (ref 0–37)
Albumin: 4.6 g/dL (ref 3.5–5.2)
Alkaline Phosphatase: 49 U/L (ref 39–117)
BUN: 14 mg/dL (ref 6–23)
CO2: 33 mEq/L — ABNORMAL HIGH (ref 19–32)
Calcium: 9.2 mg/dL (ref 8.4–10.5)
Chloride: 99 mEq/L (ref 96–112)
Creatinine, Ser: 0.65 mg/dL (ref 0.40–1.20)
GFR: 89.68 mL/min (ref >60.00)
Glucose, Bld: 92 mg/dL (ref 70–99)
Potassium: 3.5 mEq/L (ref 3.5–5.1)
Sodium: 139 mEq/L (ref 135–145)
Total Bilirubin: 0.5 mg/dL (ref 0.2–1.2)
Total Protein: 7.4 g/dL (ref 6.0–8.3)

## 2021-06-13 LAB — TSH: TSH: 7.85 u[IU]/mL — ABNORMAL HIGH (ref 0.35–5.50)

## 2021-06-13 LAB — T4, FREE: Free T4: 0.67 ng/dL (ref 0.60–1.60)

## 2021-06-13 NOTE — Patient Instructions (Signed)
We will check the labs and get you in for the colonoscopy. ? ? ?

## 2021-06-13 NOTE — Progress Notes (Signed)
? ?  Subjective:  ? ?Patient ID: Theresa Perez, female    DOB: 1950/11/22, 71 y.o.   MRN: [id number] ? ?HPI ?The patient is here for physical. ? ?PMH, [facility], social history reviewed and updated ? ?Review of Systems  ?Constitutional: Negative.   ?HENT: Negative.    ?Eyes: Negative.   ?Respiratory:  Negative for cough, chest tightness and shortness of breath.   ?Cardiovascular:  Negative for chest pain, palpitations and leg swelling.  ?Gastrointestinal:  Negative for abdominal distention, abdominal pain, constipation, diarrhea, nausea and vomiting.  ?Musculoskeletal: Negative.   ?Skin: Negative.   ?Neurological: Negative.   ?Psychiatric/Behavioral: Negative.    ? ?Objective:  ?Physical Exam ?Constitutional:   ?   Appearance: She is well-developed.  ?HENT:  ?   Head: Normocephalic and atraumatic.  ?Cardiovascular:  ?   Rate and Rhythm: Normal rate and regular rhythm.  ?Pulmonary:  ?   Effort: Pulmonary effort is normal. No respiratory distress.  ?   Breath sounds: Normal breath sounds. No wheezing or rales.  ?Abdominal:  ?   General: Bowel sounds are normal. There is no distension.  ?   Palpations: Abdomen is soft.  ?   Tenderness: There is no abdominal tenderness. There is no rebound.  ?Musculoskeletal:  ?   Cervical back: Normal range of motion.  ?Skin: ?   General: Skin is warm and dry.  ?Neurological:  ?   Mental Status: She is alert and oriented to person, place, and time.  ?   Coordination: Coordination normal.  ? ? ?Vitals:  ? 06/13/21 0942  ?BP: 124/88  ?Pulse: 75  ?Resp: 18  ?SpO2: 98%  ?Weight: 177 lb 6.4 oz (80.5 kg)  ?Height: '5\' 11"'$  (1.803 m)  ? ? ?This visit occurred during the SARS-CoV-2 public health emergency.  Safety protocols were in place, including screening questions prior to the visit, additional usage of staff PPE, and extensive cleaning of exam room while observing appropriate contact time as indicated for disinfecting solutions.  ? ?Assessment & Plan:  ? ?

## 2021-06-14 ENCOUNTER — Ambulatory Visit: Payer: Self-pay

## 2021-06-14 ENCOUNTER — Ambulatory Visit: Payer: Medicare PPO | Admitting: Orthopaedic Surgery

## 2021-06-14 ENCOUNTER — Ambulatory Visit (INDEPENDENT_AMBULATORY_CARE_PROVIDER_SITE_OTHER): Payer: Medicare PPO

## 2021-06-14 ENCOUNTER — Encounter: Payer: Self-pay | Admitting: Orthopaedic Surgery

## 2021-06-14 DIAGNOSIS — G8929 Other chronic pain: Secondary | ICD-10-CM

## 2021-06-14 DIAGNOSIS — M25562 Pain in left knee: Secondary | ICD-10-CM

## 2021-06-14 DIAGNOSIS — M25561 Pain in right knee: Secondary | ICD-10-CM | POA: Diagnosis not present

## 2021-06-14 DIAGNOSIS — M170 Bilateral primary osteoarthritis of knee: Secondary | ICD-10-CM

## 2021-06-14 MED ORDER — METHYLPREDNISOLONE ACETATE 40 MG/ML IJ SUSP
80.0000 mg | INTRAMUSCULAR | Status: AC | PRN
  Administered 2021-06-14: 80 mg via INTRA_ARTICULAR

## 2021-06-14 MED ORDER — LIDOCAINE HCL 1 % IJ SOLN
2.0000 mL | INTRAMUSCULAR | Status: AC | PRN
  Administered 2021-06-14: 2 mL

## 2021-06-14 MED ORDER — BUPIVACAINE HCL 0.25 % IJ SOLN
2.0000 mL | INTRAMUSCULAR | Status: AC | PRN
  Administered 2021-06-14: 2 mL via INTRA_ARTICULAR

## 2021-06-14 NOTE — Progress Notes (Signed)
? ?Office Visit Note ?  ?Patient: Theresa Perez           ?Date of Birth: 10-15-1950           ?MRN: [id number] ?Visit Date: 06/14/2021 ?             ?Requested by: Hoyt Koch, MD ?[place],  [place] [id number] ?PCP: Hoyt Koch, MD ? ? ?Assessment & Plan: ?Visit Diagnoses:  ?1. Chronic pain of both knees   ?2. Bilateral primary osteoarthritis of knee   ? ? ?Plan: Mrs. Riche notes a long history of bilateral knee problems.  She notes having a cortisone injection sometime in the past with good relief.  Recently she developed recurrent arthritic symptoms more on the right than the left knee.  She notes that on occasion her knees are stiff and sore and achy.  She has tried over-the-counter medicines.  She remains quite active and was thinking she would like to have another cortisone injection.  Her films demonstrate advanced osteoarthritis of both knees mostly in the medial compartment where there is near bone-on-bone.  Long discussion regarding diagnosis and treatment options.  We will inject the medial compartment of the right knee today.  I have discussed viscosupplementation and even injection in the left knee.  All questions were answered ? ?Follow-Up Instructions: Return if symptoms worsen or fail to improve.  ? ?Orders:  ?Orders Placed This Encounter  ?Procedures  ? XR KNEE 3 VIEW LEFT  ? XR KNEE 3 VIEW RIGHT  ? ?No orders of the defined types were placed in this encounter. ? ? ? ? Procedures: ?Large Joint Inj: R knee on 06/14/2021 3:23 PM ?Indications: pain and diagnostic evaluation ?Details: 25 G 1.5 in needle, anteromedial approach ? ?Arthrogram: No ? ?Medications: 2 mL lidocaine 1 %; 80 mg methylPREDNISolone acetate 40 MG/ML; 2 mL bupivacaine 0.25 % ?Procedure, treatment alternatives, risks and benefits explained, specific risks discussed. Consent was given by the patient. Immediately prior to procedure a time out was called to verify the correct patient, procedure,  equipment, support staff and site/side marked as required. Patient was prepped and draped in the usual sterile fashion.  ? ? ? ? ?Clinical Data: ?No additional findings. ? ? ?Subjective: ?Chief Complaint  ?Patient presents with  ? Right Knee - Pain  ? Left Knee - Pain  ?Long history of bilateral knee problems "off-and-on".  In the remote past she has had a cortisone injection of 1 or both knees with good relief of her pain.  She is aware that she does have some arthritis.  She she remains physically active during the day with her grandchildren and notes that recently she had insidious onset of recurrent right knee pain.  She has some mild discomfort on the left.  She has had some popping clicking and maybe even some swelling.  No history of injury or trauma ? ?HPI ? ?Review of Systems ? ? ?Objective: ?Vital Signs: There were no vitals taken for this visit. ? ?Physical Exam ?Constitutional:   ?   Appearance: She is well-developed.  ?Eyes:  ?   Pupils: Pupils are equal, round, and reactive to light.  ?Pulmonary:  ?   Effort: Pulmonary effort is normal.  ?Skin: ?   General: Skin is warm and dry.  ?Neurological:  ?   Mental Status: She is alert and oriented to person, place, and time.  ?Psychiatric:     ?   Behavior: Behavior normal.  ? ? ?  Ortho Exam awake alert and oriented x3.  Comfortable sitting.  Very small effusion in both knees she lacked about 3 to 4 degrees to full extension bilaterally.  Mostly medial joint pain.  Flexed almost 120 degrees of both knees without instability.  There was some popliteal fullness but no pain bilaterally and no calf pain or distal edema.  Some patella crepitation but little if any discomfort with patella compression ? ?Specialty Comments:  ?No specialty comments available. ? ?Imaging: ?XR KNEE 3 VIEW LEFT ? ?Result Date: 06/14/2021 ?Films of the right knee were obtained in 3 projections standing.  The films are quite similar to the left knee where there is increased varus of about 1  to 2 degrees of narrowing the medial joint space.  There is subchondral sclerosis and peripheral osteophytes.  There are degenerative changes at the patellofemoral compartment and the lateral compartment as well.  No acute changes or ectopic calcification.  Films are consistent with advanced osteoarthritis ? ?XR KNEE 3 VIEW RIGHT ? ?Result Date: 06/14/2021 ?Films of the right knee were obtained in 3 projections standing and compared to the left knee of there are advanced degenerative changes particularly in the medial compartment where there is narrowing of the joint space 1 to 2 degrees of varus and subchondral sclerosis.  There are peripheral osteophytes.  There are degenerative changes of the patellofemoral and lateral compartments as well.  No acute changes no ectopic calcification.  Films are consistent with advanced osteoarthritis  ? ? ?PMFS History: ?Patient Active Problem List  ? Diagnosis Date Noted  ? Bilateral primary osteoarthritis of knee 06/14/2021  ? Adjustment disorder with mixed anxiety and depressed mood 08/02/2019  ? Memory change 06/25/2019  ? Smokers' cough ([place]) 12/11/2017  ? De Quervain's disease (radial styloid tenosynovitis) 04/01/2017  ? Arthritis of left knee 02/22/2015  ? Routine general medical examination at a health care facility 12/16/2014  ? Constipation 07/13/2014  ? Papillary thyroid carcinoma ([place]) 03/23/2014  ? Pure hypercholesterolemia 12/08/2013  ? Varicose veins of lower extremities with other complications 75/17/0017  ? Essential hypertension 05/19/2007  ? Allergic rhinitis 05/19/2007  ? ?Past Medical History:  ?Diagnosis Date  ? Allergy   ? Anxiety   ? Cancer [facility])   ? Thyroid-Papillary  ? Complication of anesthesia   ? Cyst of breast, right, diffuse fibrocystic   ? Hypertension   ? Hypothyroidism   ? PONV (postoperative nausea and vomiting)   ?  ?Family History  ?Problem Relation Age of Onset  ? Diabetes Mother   ? Deep vein thrombosis Mother   ?     Varicose Veins  ? Cancer  Father   ?     Father died from brain tumor.  ? Heart disease Father   ? Colon cancer Neg Hx   ? Stomach cancer Neg Hx   ?  ?Past Surgical History:  ?Procedure Laterality Date  ? ANTERIOR AND POSTERIOR REPAIR N/A 03/31/2012  ? Procedure: ANTERIOR (CYSTOCELE) AND POSTERIOR REPAIR (RECTOCELE);  Surgeon: Gus Height, MD;  Location: [facility];  Service: Gynecology;  Laterality: N/A;  ? aspiration cyst right breast    ? THYROIDECTOMY  1997  ? TONSILLECTOMY  1969  ? uterine ablation    ? VAGINAL HYSTERECTOMY N/A 03/31/2012  ? Procedure: HYSTERECTOMY VAGINAL;  Surgeon: Gus Height, MD;  Location: [facility];  Service: Gynecology;  Laterality: N/A;  ? ?Social History  ? ?Occupational History  ? Not on file  ?Tobacco Use  ? Smoking status: Every Day  ?  Packs/day: 0.50  ?  Years: 15.00  ?  Pack years: 7.50  ?  Types: Cigarettes  ?  Passive exposure: Past  ? Smokeless tobacco: Never  ? Tobacco comments:  ?  Occassional smoker  ?Vaping Use  ? Vaping Use: Never used  ?Substance and Sexual Activity  ? Alcohol use: Yes  ?  Comment: Rarely  ? Drug use: No  ? Sexual activity: Yes  ?  Birth control/protection: Post-menopausal  ? ? ? ?Garald Balding, MD ? ? ?Note - This record has been created using [employer].  ?Chart creation errors have been sought, but may not always  ?have been located. Such creation errors do not reflect on  ?the standard of medical care. ? ? ?

## 2021-06-14 NOTE — Assessment & Plan Note (Addendum)
Doing well on wellbutrin 150 mg daily and cymbalta 60 mg BID. Uses lorazepam 0.5 mg daily prn as well and is controlled. Will continue.  ?

## 2021-06-14 NOTE — Assessment & Plan Note (Signed)
She is still smoking but trying to cut back. Encouraged attempt to quit. Reminded about risk/harm.  ?

## 2021-06-14 NOTE — Assessment & Plan Note (Signed)
Checking TSH and free T4 and adjust as needed her levothyroxine 100 mcg daily.  ?

## 2021-06-14 NOTE — Assessment & Plan Note (Signed)
Checking lipid panel and adjust as needed. Not on medication currently and goal LDL <130 ?

## 2021-06-14 NOTE — Assessment & Plan Note (Signed)
Flu shot yearly. Covid-19 counseled. Pneumonia complete. Shingrix complete. Tetanus up to date. Colonoscopy referral to GI. Mammogram up to date, pap smear aged out and dexa complete. Counseled about sun safety and mole surveillance. Counseled about the dangers of distracted driving. Given 10 year screening recommendations.  ? ?

## 2021-06-14 NOTE — Assessment & Plan Note (Signed)
BP at goal on atenolol/chlorthalidone 50/25 (1/2 pill daily for total dose 25/12.5 mg). Will check CMP and adjust as needed.  ?

## 2021-06-27 ENCOUNTER — Encounter: Payer: Self-pay | Admitting: Gastroenterology

## 2021-06-29 ENCOUNTER — Other Ambulatory Visit: Payer: Self-pay | Admitting: Internal Medicine

## 2021-06-29 ENCOUNTER — Other Ambulatory Visit: Payer: Self-pay | Admitting: Adult Health

## 2021-06-29 DIAGNOSIS — F411 Generalized anxiety disorder: Secondary | ICD-10-CM

## 2021-06-29 DIAGNOSIS — F428 Other obsessive-compulsive disorder: Secondary | ICD-10-CM

## 2021-06-29 DIAGNOSIS — E890 Postprocedural hypothyroidism: Secondary | ICD-10-CM

## 2021-07-02 ENCOUNTER — Ambulatory Visit: Payer: Medicare PPO | Admitting: Psychiatry

## 2021-07-02 DIAGNOSIS — F411 Generalized anxiety disorder: Secondary | ICD-10-CM

## 2021-07-02 NOTE — Progress Notes (Signed)
?    [facility] Counselor/Therapist Progress Note ? ?Patient ID: Theresa Perez, MRN: [id number],   ? ?Date: 07/02/2021 ? ?Time Spent: 50 minutes ? ?Treatment Type: Individual Therapy ? ?Reported Symptoms:  anxiety ? ?Mental Status Exam: ? ?Appearance:   Casual     ?Behavior:  Appropriate, Sharing, and Motivated  ?Motor:  Normal  ?Speech/Language:   Clear and Coherent  ?Affect:  Some anxiety  ?Mood:  anxious  ?Thought process:  goal directed  ?Thought content:    Overthinking but decreased  ?Sensory/Perceptual disturbances:    WNL  ?Orientation:  oriented to person, place, time/date, situation, day of week, month of year, year, and stated date of Jul 02, 2021  ?Attention:  Good  ?Concentration:  Good  ?Memory:  WNL  ?Fund of knowledge:   Good  ?Insight:    Good  ?Judgment:   Good  ?Impulse Control:  Good  ? ?Risk Assessment: ?Danger to Self:  No ?Self-injurious Behavior: No ?Danger to Others: No ?Duty to Warn:no ?Physical Aggression / Violence:No  ?Access to Firearms a concern: No  ?Gang Involvement:No  ? ?Subjective:  Patient in today and reports symptoms of anxiety, although decreasing. Waking up more positive, not spending as much time with games on her phone and getting involved in more activities including reading and being with people. Some overthinking, but decreasing. Still some overtalking but patient feels she is getting better gradually. Patient feels she is better and not needing to return at this time. Feels she is working on her goal still and will continue to do so in order to maintain her gains. "I feel like I wake up in better mood and not overthinking as much.  Feels ready to try things on her own and from what I have seen the last couple of sessions it does seem she has made some significant progress in certain areas.  Don't dwell on negatives as much as I used to. Not rescheduling today but will definitely contact our office if she needs to return for further care.  She is to continue staying  on her medication at least until she sees her med provider next month.  Encouraged patient to continue in some of the activities that has helped her and also to maintain her gains in terms of interacting in relationships, trying to over think and over talk less, and continue healthy boundaries including within her own family.  Continues to enjoy riding her bike, playing the piano, walking, traveling, church activities, and family get-togethers and feels that these all contribute to her emotional wellbeing. ?  ?Interventions: Solution-Oriented/Positive Psychology and Insight-Oriented ? ?Treatment goal plan:  ?Patient not signing treatment plan on computer screen due to Covid. ?Treatment Goals: ?Goals remain on treatment plan as patient works on strategies to achieve her goals.  Progress will be noted each week in "Progress" section of treatment plan. ?Long term goal: ?Reduce overall level, frequency, and intensity of the anxiety so that daily functioning is not impaired. ?Short term goal: ?Identify, challenge, and replace anxious/fearful/depressive thoughts and self-talk with more positive, realistic, and empowering thoughts and self-talk that do not support anxiety nor depression. ?Strategy: ?Review, repeat, and reinforce success ? ?Diagnosis: ?  ICD-10-CM   ?1. Generalized anxiety disorder  F41.1   ?  ? ?Plan:  Patient today showing good motivation and engagement in session working further on her anxiety, "over talking", overthinking, some relationship issues within the family especially her adult granddaughter, and "control" issues.  Reports continued progress in all  of these areas and feels she is ready to try things on her own.  Shares that she has a lot of traveling plan for this summer anyway and wants to try managing things on her own with the understanding that she can return if she needs to.  Working further on relationship/communication with husband, as well as some better contact with adult grand-daughter  "B", and on her continued goal-directed behaviors. Anxiety is decreasing and she is "really noticing it."  Does present more positive and wants to maintain the gains she has made in terms of treatment goal directed behaviors.  Encouraged her to continue work on "letting go" of things that easily irritate her, and to continue to work on decreasing her overthinking and over talking as it can affect relationships that are important to her. Encouraged patient in her practice of more positive behaviors including: Believing in herself and her ability to make more changes of long time habits that hold her back from moving forward, recognize progress daily, practice active listening skills to really hear what other people are saying before she responds, begin the process of stopping excessive smoking and not using it as a coping tool, work on letting go of trying to control things that she really cannot control, have more time for reflection and prayer as this has been helpful to her in the past, decrease her rigid overthinking and over analyzing of how she feels "things should be", pay attention to how she says what she says, get outside daily and walk, enjoy time with her pet dog which is very therapeutic for her, return to her exercise at the gym, encourage positive self talk, remaining involved in the present focusing on what she can control or change, healthy boundaries with others, become more self aware especially in interactions with others, and recognize the strength she shows as she works with goal-directed behaviors to move in a direction that supports her improved emotional health and overall wellbeing. ? ?Goal review and progress/challenges noted with patient. ? ?Next appointment within 4 weeks. ? ?This record has been created using [employer].  Chart creation errors have been sought, but may not always have been located and corrected.  Such creation errors do not reflect on the standard of medical care  provided. ? ? ?Shanon Ace, LCSW ? ? ? ? ? ? ? ? ? ? ? ? ? ? ? ? ? ? ?

## 2021-07-02 NOTE — Progress Notes (Deleted)
? ?  Established Patient Office Visit ? ?Subjective   ?Patient ID: NOELLA KIPNIS, female    DOB: 03-Feb-1951  Age: 71 y.o. MRN: [id number] ? ?No chief complaint on file. ? ? ?HPI ? ?{History (Optional):23778} ? ?ROS ? ?  ?Objective:  ?  ? ?There were no vitals taken for this visit. ?{Vitals History (Optional):23777} ? ?Physical Exam ? ? ?No results found for any visits on 07/02/21. ? ?{Labs (Optional):23779} ? ?The 10-year ASCVD risk score (Arnett DK, et al., 2019) is: 17.4% ? ?  ?Assessment & Plan:  ? ?Problem List Items Addressed This Visit   ?None ? ? ?No follow-ups on file.  ? ? ?Shanon Ace, LCSW ? ?

## 2021-07-11 ENCOUNTER — Other Ambulatory Visit: Payer: Self-pay | Admitting: Internal Medicine

## 2021-07-11 DIAGNOSIS — J069 Acute upper respiratory infection, unspecified: Secondary | ICD-10-CM

## 2021-07-18 ENCOUNTER — Ambulatory Visit (AMBULATORY_SURGERY_CENTER): Payer: Self-pay | Admitting: *Deleted

## 2021-07-18 VITALS — Ht 71.0 in | Wt 181.0 lb

## 2021-07-18 DIAGNOSIS — Z1211 Encounter for screening for malignant neoplasm of colon: Secondary | ICD-10-CM

## 2021-07-18 MED ORDER — NA SULFATE-K SULFATE-MG SULF 17.5-3.13-1.6 GM/177ML PO SOLN: 1.0000 | ORAL | 0 refills | Status: DC

## 2021-07-18 NOTE — Progress Notes (Signed)

## 2021-07-23 ENCOUNTER — Ambulatory Visit: Payer: Medicare PPO | Admitting: Psychiatry

## 2021-07-30 ENCOUNTER — Encounter: Payer: Self-pay | Admitting: Internal Medicine

## 2021-08-01 ENCOUNTER — Ambulatory Visit: Payer: Medicare PPO | Admitting: Adult Health

## 2021-08-07 ENCOUNTER — Ambulatory Visit: Payer: Medicare PPO | Admitting: Internal Medicine

## 2021-08-07 DIAGNOSIS — F411 Generalized anxiety disorder: Secondary | ICD-10-CM

## 2021-08-07 DIAGNOSIS — F428 Other obsessive-compulsive disorder: Secondary | ICD-10-CM

## 2021-08-07 DIAGNOSIS — J189 Pneumonia, unspecified organism: Secondary | ICD-10-CM

## 2021-08-07 MED ORDER — DOXYCYCLINE HYCLATE 100 MG PO TABS: 100.0000 mg | ORAL_TABLET | Freq: Two times a day (BID) | ORAL | 0 refills | Status: DC

## 2021-08-07 NOTE — Progress Notes (Unsigned)
   Subjective:   Patient ID: Theresa Perez, female    DOB: 1950-08-15, 71 y.o.   MRN: [id number]  Cough Associated symptoms include a fever, myalgias, postnasal drip and rhinorrhea. Pertinent negatives include no chills, ear pain, sore throat, shortness of breath or wheezing.   The patient is a 71 YO female coming in for sick visit.   Review of Systems  Constitutional:  Positive for activity change, appetite change, fatigue and fever. Negative for chills and unexpected weight change.  HENT:  Positive for congestion, postnasal drip, rhinorrhea and sinus pressure. Negative for ear discharge, ear pain, sinus pain, sneezing, sore throat, tinnitus, trouble swallowing and voice change.   Eyes: Negative.   Respiratory:  Positive for cough. Negative for chest tightness, shortness of breath and wheezing.   Cardiovascular: Negative.   Gastrointestinal: Negative.   Musculoskeletal:  Positive for myalgias.  Neurological: Negative.     Objective:  Physical Exam Constitutional:      Appearance: She is well-developed.  HENT:     Head: Normocephalic and atraumatic.     Comments: Oropharynx with redness and clear drainage, nose with swollen turbinates, TMs normal bilaterally.  Neck:     Thyroid: No thyromegaly.  Cardiovascular:     Rate and Rhythm: Normal rate and regular rhythm.  Pulmonary:     Effort: Pulmonary effort is normal. No respiratory distress.     Breath sounds: Rhonchi present. No wheezing or rales.     Comments: Right lung with rhonchi Abdominal:     Palpations: Abdomen is soft.  Musculoskeletal:        General: Tenderness present.     Cervical back: Normal range of motion.  Lymphadenopathy:     Cervical: No cervical adenopathy.  Skin:    General: Skin is warm and dry.  Neurological:     Mental Status: She is alert and oriented to person, place, and time.     Vitals:   08/07/21 1546  BP: (!) 150/90  Pulse: 87  Resp: 20  Temp: (!) 100.4 F (38 C)  SpO2: 95%   Weight: 178 lb 12.8 oz (81.1 kg)  Height: '5\' 11"'$  (1.803 m)    Assessment & Plan:

## 2021-08-07 NOTE — Patient Instructions (Signed)
We have sent in doxycycline to take 1 pill twice a day for 1 week.  

## 2021-08-08 ENCOUNTER — Encounter: Payer: Self-pay | Admitting: Internal Medicine

## 2021-08-08 DIAGNOSIS — J189 Pneumonia, unspecified organism: Secondary | ICD-10-CM | POA: Insufficient documentation

## 2021-08-08 NOTE — Assessment & Plan Note (Signed)
Rhonchi right lung upper and lower in smoker makes CAP clinically diagnosed. Rx doxycycline 1 week course. Advised to quit smoking. Can take otc medications for other symptoms. She is still having fevers about 1 week into illness making viral less likely.

## 2021-08-10 ENCOUNTER — Other Ambulatory Visit: Payer: Self-pay | Admitting: Internal Medicine

## 2021-08-10 DIAGNOSIS — I10 Essential (primary) hypertension: Secondary | ICD-10-CM

## 2021-08-10 DIAGNOSIS — F4322 Adjustment disorder with anxiety: Secondary | ICD-10-CM

## 2021-08-15 ENCOUNTER — Encounter: Payer: Medicare PPO | Admitting: Gastroenterology

## 2021-09-28 ENCOUNTER — Other Ambulatory Visit: Payer: Self-pay | Admitting: Internal Medicine

## 2021-09-28 ENCOUNTER — Other Ambulatory Visit: Payer: Self-pay | Admitting: Adult Health

## 2021-09-28 DIAGNOSIS — F411 Generalized anxiety disorder: Secondary | ICD-10-CM

## 2021-09-28 DIAGNOSIS — J069 Acute upper respiratory infection, unspecified: Secondary | ICD-10-CM

## 2021-09-28 DIAGNOSIS — E890 Postprocedural hypothyroidism: Secondary | ICD-10-CM

## 2021-09-28 DIAGNOSIS — F428 Other obsessive-compulsive disorder: Secondary | ICD-10-CM

## 2021-09-28 NOTE — Telephone Encounter (Signed)
Please call to schedule appt. Canceled F/U in June.

## 2021-10-04 NOTE — Telephone Encounter (Signed)
On 8/17- LM for patient on both numbers. She will need to schedule appt with Barnett Applebaum M.RF pending

## 2021-10-05 ENCOUNTER — Encounter: Payer: Self-pay | Admitting: Internal Medicine

## 2021-10-05 DIAGNOSIS — F428 Other obsessive-compulsive disorder: Secondary | ICD-10-CM

## 2021-10-05 DIAGNOSIS — F411 Generalized anxiety disorder: Secondary | ICD-10-CM

## 2021-10-09 ENCOUNTER — Telehealth: Payer: Self-pay | Admitting: Internal Medicine

## 2021-10-09 MED ORDER — BUPROPION HCL ER (XL) 150 MG PO TB24: 150.0000 mg | ORAL_TABLET | Freq: Every day | ORAL | 3 refills | Status: DC

## 2021-10-09 NOTE — Telephone Encounter (Signed)
Caller & Relationship to patient: Theresa Perez  Call back number: [birth year]  Date of last office visit: 08/07/21  Date of next office visit:   Medication(s) to be refilled: buPROPion (WELLBUTRIN XL) 150 MG 24 hr tablet  Preferred Pharmacy:  [facility] [place] Phone:  [phone]  Fax:  [phone]

## 2021-10-10 NOTE — Telephone Encounter (Signed)
Already done via mychart

## 2021-10-31 ENCOUNTER — Other Ambulatory Visit: Payer: Self-pay | Admitting: Internal Medicine

## 2021-10-31 DIAGNOSIS — Z1231 Encounter for screening mammogram for malignant neoplasm of breast: Secondary | ICD-10-CM

## 2021-11-12 ENCOUNTER — Other Ambulatory Visit: Payer: Self-pay | Admitting: Internal Medicine

## 2021-11-12 DIAGNOSIS — F4322 Adjustment disorder with anxiety: Secondary | ICD-10-CM

## 2021-11-13 ENCOUNTER — Other Ambulatory Visit: Payer: Self-pay | Admitting: Internal Medicine

## 2021-11-13 DIAGNOSIS — J069 Acute upper respiratory infection, unspecified: Secondary | ICD-10-CM

## 2021-11-19 ENCOUNTER — Other Ambulatory Visit: Payer: Self-pay | Admitting: Internal Medicine

## 2021-11-19 DIAGNOSIS — F4322 Adjustment disorder with anxiety: Secondary | ICD-10-CM

## 2021-11-21 NOTE — Telephone Encounter (Signed)
Last refill 08/10/21. Please advise

## 2021-11-22 ENCOUNTER — Ambulatory Visit: Payer: Medicare PPO

## 2021-11-22 NOTE — Telephone Encounter (Signed)
Patient is completely out of the duluxotine.

## 2021-11-22 NOTE — Telephone Encounter (Signed)
Patient needs her duluxotine.  Please send to [facility] [address]  She also needs refills sent in on Flonase,  &  Atenolol,   Please put additional refills

## 2021-11-23 MED ORDER — DULOXETINE HCL 60 MG PO CPEP: 60.0000 mg | ORAL_CAPSULE | Freq: Two times a day (BID) | ORAL | 3 refills | Status: DC

## 2021-11-24 ENCOUNTER — Other Ambulatory Visit: Payer: Self-pay | Admitting: Internal Medicine

## 2021-11-24 DIAGNOSIS — I10 Essential (primary) hypertension: Secondary | ICD-10-CM

## 2021-12-11 ENCOUNTER — Ambulatory Visit
Admission: RE | Admit: 2021-12-11 | Discharge: 2021-12-11 | Disposition: A | Discharge status: Home / Self Care | Payer: Medicare PPO | Source: Ambulatory Visit | Attending: Internal Medicine | Admitting: Internal Medicine

## 2021-12-11 DIAGNOSIS — Z1231 Encounter for screening mammogram for malignant neoplasm of breast: Secondary | ICD-10-CM

## 2021-12-14 ENCOUNTER — Other Ambulatory Visit: Payer: Self-pay | Admitting: Internal Medicine

## 2021-12-14 DIAGNOSIS — R928 Other abnormal and inconclusive findings on diagnostic imaging of breast: Secondary | ICD-10-CM

## 2021-12-18 ENCOUNTER — Other Ambulatory Visit: Payer: Self-pay | Admitting: Internal Medicine

## 2021-12-18 DIAGNOSIS — J069 Acute upper respiratory infection, unspecified: Secondary | ICD-10-CM

## 2021-12-20 ENCOUNTER — Ambulatory Visit (INDEPENDENT_AMBULATORY_CARE_PROVIDER_SITE_OTHER): Payer: Medicare PPO

## 2021-12-20 ENCOUNTER — Ambulatory Visit (INDEPENDENT_AMBULATORY_CARE_PROVIDER_SITE_OTHER): Payer: Medicare PPO | Admitting: Nurse Practitioner

## 2021-12-20 ENCOUNTER — Encounter: Payer: Self-pay | Admitting: Nurse Practitioner

## 2021-12-20 VITALS — BP 125/85 | HR 74 | Temp 97.3°F | Wt 183.6 lb

## 2021-12-20 DIAGNOSIS — R21 Rash and other nonspecific skin eruption: Secondary | ICD-10-CM

## 2021-12-20 DIAGNOSIS — M7989 Other specified soft tissue disorders: Secondary | ICD-10-CM | POA: Diagnosis not present

## 2021-12-20 DIAGNOSIS — M5441 Lumbago with sciatica, right side: Secondary | ICD-10-CM

## 2021-12-20 DIAGNOSIS — R600 Localized edema: Secondary | ICD-10-CM | POA: Diagnosis not present

## 2021-12-20 MED ORDER — TRIAMCINOLONE ACETONIDE 0.1 % EX CREA: 1.0000 | TOPICAL_CREAM | Freq: Two times a day (BID) | CUTANEOUS | 0 refills | Status: DC

## 2021-12-20 NOTE — Progress Notes (Signed)
Acute Office Visit  Subjective:     Patient ID: Theresa Perez, female    DOB: 08/18/1950, 71 y.o.   MRN: [id number]  Chief Complaint  Patient presents with   Acute Visit    Pt c/o swelling/redness/soreness in RT toe and foot x2 weeks.     HPI Patient is in today for foot pain and swelling in her right 4th toe for 2 weeks. She states that she stubbed her toe on a desk and since then has noticed swelling and redness to her 4th toe on her right foot. She states she is having some soreness, however not too much pain. She has been using ice and ibuprofen. She states that she was able to play tennis last weekend and wear a tennis shoe. She wants to know if her toe may be broken.   She has also noticed some pain in her right lower back that radiates down her buttock to the back of her leg. She thinks it may be sciatica. She states her friend told her a stretch that she can do to help the pain, which has helped. She has also been using lidocaine cream which has helped with the pain. She denies fever, weakness, and injury.   ROS See pertinent positives and negatives per HPI.     Objective:    BP 125/85   Pulse 74   Temp (!) 97.3 F (36.3 C) (Temporal)   Wt 183 lb 9.6 oz (83.3 kg)   SpO2 97%   BMI 25.61 kg/m    Physical Exam Vitals and nursing note reviewed.  Constitutional:      General: She is not in acute distress.    Appearance: Normal appearance.  HENT:     Head: Normocephalic.  Eyes:     Conjunctiva/sclera: Conjunctivae normal.  Pulmonary:     Effort: Pulmonary effort is normal.  Musculoskeletal:        General: Swelling (4th toe on right foot), tenderness (4th toe on right foot) and signs of injury (4th toe on right foot) present.     Cervical back: Normal range of motion.     Comments: ROM normal to lumbar spine  Skin:    General: Skin is warm.  Neurological:     General: No focal deficit present.     Mental Status: She is alert and oriented to person, place,  and time.  Psychiatric:        Mood and Affect: Mood normal.        Behavior: Behavior normal.        Thought Content: Thought content normal.        Judgment: Judgment normal.       Assessment & Plan:   Problem List Items Addressed This Visit   None Visit Diagnoses     Toe swelling    -  Primary   Noted after injury 2 weeks ago. Still swollen and tender on exam. Will check x-ray today. Continue ibuprofen, elevation, and can buddy tape toes together.   Relevant Orders   DG Toe 4th Right   Acute right-sided low back pain with right-sided sciatica       No red flags on exam. Can continue stretches daily along with lidocaine cream as needed. Can use heat/ice as well. F/U if not improving.   Rash       She uses triamcinolone cream as needed if she was bit by an ant. Refill sent to her pharmacy.   Relevant Medications  triamcinolone cream (KENALOG) 0.1 %       Meds ordered this encounter  Medications   triamcinolone cream (KENALOG) 0.1 %    Sig: Apply 1 Application topically 2 (two) times daily.    Dispense:  30 g    Refill:  0    Return if symptoms worsen or fail to improve.  Charyl Dancer, NP

## 2021-12-20 NOTE — Progress Notes (Unsigned)
Initial visit for RT 4th toe pain and swelling after hitting toe last week.

## 2021-12-20 NOTE — Patient Instructions (Signed)
It was great to see you!  Start the exercises daily for your back and hip. Keep using the lidocaine cream to your buttock/hip.   We will check x-rays of your foot. You can keep using ice and ibuprofen as needed. Elevated your foot while sitting. If you are having pain, you can tape your toe to the toe next to it.   Let's follow-up if your symptoms worsen or don't improve.   Take care,  Vance Peper, NP

## 2021-12-24 ENCOUNTER — Other Ambulatory Visit: Payer: Self-pay | Admitting: Internal Medicine

## 2021-12-24 DIAGNOSIS — E890 Postprocedural hypothyroidism: Secondary | ICD-10-CM

## 2021-12-26 ENCOUNTER — Ambulatory Visit: Admission: RE | Admit: 2021-12-26 | Payer: Medicare PPO | Source: Ambulatory Visit

## 2021-12-26 ENCOUNTER — Other Ambulatory Visit: Payer: Self-pay | Admitting: Internal Medicine

## 2021-12-26 ENCOUNTER — Ambulatory Visit
Admission: RE | Admit: 2021-12-26 | Discharge: 2021-12-26 | Disposition: A | Discharge status: Home / Self Care | Payer: Medicare PPO | Source: Ambulatory Visit | Attending: Internal Medicine | Admitting: Internal Medicine

## 2021-12-26 DIAGNOSIS — R928 Other abnormal and inconclusive findings on diagnostic imaging of breast: Secondary | ICD-10-CM

## 2021-12-26 DIAGNOSIS — R921 Mammographic calcification found on diagnostic imaging of breast: Secondary | ICD-10-CM | POA: Diagnosis not present

## 2021-12-26 NOTE — Progress Notes (Signed)
Called and informed patient of results and provider instructions. Patient voiced understanding. Sw, cma

## 2021-12-27 ENCOUNTER — Other Ambulatory Visit: Payer: Self-pay | Admitting: Internal Medicine

## 2021-12-27 DIAGNOSIS — E890 Postprocedural hypothyroidism: Secondary | ICD-10-CM

## 2022-01-03 ENCOUNTER — Other Ambulatory Visit: Payer: Self-pay | Admitting: Internal Medicine

## 2022-01-03 DIAGNOSIS — R928 Other abnormal and inconclusive findings on diagnostic imaging of breast: Secondary | ICD-10-CM

## 2022-01-04 ENCOUNTER — Encounter (HOSPITAL_COMMUNITY): Payer: Self-pay

## 2022-01-04 ENCOUNTER — Other Ambulatory Visit: Payer: Self-pay

## 2022-01-04 ENCOUNTER — Emergency Department (HOSPITAL_COMMUNITY): Payer: Medicare PPO

## 2022-01-04 ENCOUNTER — Telehealth: Payer: Self-pay | Admitting: Internal Medicine

## 2022-01-04 ENCOUNTER — Emergency Department (HOSPITAL_COMMUNITY)
Admission: EM | Admit: 2022-01-04 | Discharge: 2022-01-04 | Disposition: A | Discharge status: Home / Self Care | Payer: Medicare PPO | Attending: Emergency Medicine | Admitting: Emergency Medicine

## 2022-01-04 DIAGNOSIS — S0083XA Contusion of other part of head, initial encounter: Secondary | ICD-10-CM | POA: Insufficient documentation

## 2022-01-04 DIAGNOSIS — S0993XA Unspecified injury of face, initial encounter: Secondary | ICD-10-CM | POA: Diagnosis present

## 2022-01-04 DIAGNOSIS — S80219A Abrasion, unspecified knee, initial encounter: Secondary | ICD-10-CM | POA: Insufficient documentation

## 2022-01-04 DIAGNOSIS — W01198A Fall on same level from slipping, tripping and stumbling with subsequent striking against other object, initial encounter: Secondary | ICD-10-CM | POA: Diagnosis not present

## 2022-01-04 DIAGNOSIS — I6529 Occlusion and stenosis of unspecified carotid artery: Secondary | ICD-10-CM | POA: Diagnosis not present

## 2022-01-04 DIAGNOSIS — Y9301 Activity, walking, marching and hiking: Secondary | ICD-10-CM | POA: Insufficient documentation

## 2022-01-04 DIAGNOSIS — W19XXXA Unspecified fall, initial encounter: Secondary | ICD-10-CM

## 2022-01-04 DIAGNOSIS — S0990XA Unspecified injury of head, initial encounter: Secondary | ICD-10-CM | POA: Diagnosis not present

## 2022-01-04 DIAGNOSIS — I6782 Cerebral ischemia: Secondary | ICD-10-CM | POA: Diagnosis not present

## 2022-01-04 DIAGNOSIS — G319 Degenerative disease of nervous system, unspecified: Secondary | ICD-10-CM | POA: Diagnosis not present

## 2022-01-04 NOTE — ED Provider Notes (Signed)
[facility] DEPT Provider Note   CSN: [id number] Arrival date & time: 01/04/22  1735     History  Chief Complaint  Patient presents with   Theresa Perez is a 71 y.o. female Presenting after fall.  She reports that she got her sandal stuck while she was walking, tripped and hit her right knee and left forehead on a brick wall.  No loss of consciousness.  No thinners.  She says that she feels normal but she called her PCPs office who told her she needed to come to the emergency department ASAP for a CAT scan.   Fall       Home Medications Prior to Admission medications   Medication Sig Start Date End Date Taking? Authorizing Provider  DULoxetine (CYMBALTA) 60 MG capsule Take 1 capsule (60 mg total) by mouth 2 (two) times daily. 11/23/21   Hoyt Koch, MD  albuterol (VENTOLIN HFA) 108 (90 Base) MCG/ACT inhaler Inhale 2 puffs into the lungs every 6 (six) hours as needed for wheezing or shortness of breath. 12/11/20   Hoyt Koch, MD  Ascorbic Acid (VITAMIN C PO) Take 1 tablet by mouth daily.    [provider]  atenolol-chlorthalidone (TENORETIC) 50-25 MG tablet Take 1/2 (one-half) tablet by mouth once daily 11/26/21   Hoyt Koch, MD  Black Cohosh 540 MG CAPS Take 540 mg by mouth 2 (two) times daily.     [provider]  buPROPion (WELLBUTRIN XL) 150 MG 24 hr tablet Take 1 tablet (150 mg total) by mouth daily. 10/09/21   Hoyt Koch, MD  butalbital-acetaminophen-caffeine (ESGIC) [phone] MG tablet Take 1 tablet by mouth 2 (two) times daily as needed for headache. 01/07/20   Marrian Salvage, FNP  CALCIUM PO Take 1 tablet by mouth daily.    [provider]  COENZYME Q-10 PO Take 1 tablet by mouth daily.    [provider]  Cyanocobalamin (VITAMIN B-12 PO) Take 1 tablet by mouth daily.    [provider]  ECHINACEA EXTRACT PO Take by mouth.    [provider]  EVENING PRIMROSE OIL PO Take by mouth.    [provider]  ferrous sulfate 325 (65 FE) MG tablet Take 325 mg by mouth every evening.     [provider]  fluticasone Asencion Islam) 50 MCG/ACT nasal spray Use 2 spray(s) in each nostril once daily 12/20/21   Hoyt Koch, MD  Ginkgo Biloba (GNP GINGKO BILOBA EXTRACT PO) Take 1 tablet by mouth daily.    [provider]  levothyroxine (SYNTHROID) 100 MCG tablet Take 1 tablet by mouth once daily 12/24/21   Hoyt Koch, MD  LORazepam (ATIVAN) 0.5 MG tablet TAKE 1 TABLET BY MOUTH ONCE DAILY AS NEEDED FOR ANXIETY 04/27/21   Hoyt Koch, MD  Misc Natural Products (GLUCOSAMINE CHOND COMPLEX/MSM) TABS Take 1 tablet by mouth 2 (two) times daily.    [provider]  Multiple Vitamins-Minerals (MULTIVITAMIN WITH MINERALS) tablet Take 1 tablet by mouth daily.    [provider]  Nutritional Supplements (DHEA PO) Take 1 tablet by mouth daily.    [provider]  triamcinolone cream (KENALOG) 0.1 % Apply 1 Application topically 2 (two) times daily. 12/20/21   McElwee, Scheryl Darter, NP  Turmeric (QC TUMERIC COMPLEX PO) Take by mouth.    [provider]      Allergies    Permethrin    Review  of Systems   Review of Systems  Physical Exam Updated Vital Signs BP (!) 174/92 (BP Location: Left Arm)   Pulse 75   Temp 98.8 F (37.1 C) (Oral)   Resp 16   SpO2 98%  Physical Exam Vitals and nursing note reviewed.  Constitutional:      Appearance: Normal appearance.  HENT:     Head: Normocephalic and atraumatic.  Eyes:     General: No scleral icterus.    Extraocular Movements: Extraocular movements intact.     Conjunctiva/sclera: Conjunctivae normal.     Pupils: Pupils are equal, round, and reactive to light.  Pulmonary:     Effort: Pulmonary effort is normal. No respiratory distress.  Skin:    Findings: No rash.  Neurological:     Mental Status: She is  alert.     Comments: Nerves II through XII grossly intact.  No pronator drift, facial droop or problems with finger-nose testing.  Normal strength in bilateral upper and lower extremities.  Ambulatory with a steady gait.  Psychiatric:        Mood and Affect: Mood normal.     ED Results / Procedures / Treatments   Labs (all labs ordered are listed, but only abnormal results are displayed) Labs Reviewed - No data to display  EKG None  Radiology CT Head Wo Contrast  Result Date: 01/04/2022 CLINICAL DATA:  Fall head trauma EXAM: CT HEAD WITHOUT CONTRAST TECHNIQUE: Contiguous axial images were obtained from the base of the skull through the vertex without intravenous contrast. RADIATION DOSE REDUCTION: This exam was performed according to the departmental dose-optimization program which includes automated exposure control, adjustment of the mA and/or kV according to patient size and/or use of iterative reconstruction technique. COMPARISON:  MRI 07/24/2019 FINDINGS: Brain: No acute territorial infarction, hemorrhage, or intracranial mass. Mild atrophy. Patchy white matter hypodensity consistent with chronic small vessel ischemic change. Age indeterminate small vessel disease versus lacunar infarct in the left basal ganglia. Vascular: No hyperdense vessels.  Carotid vascular calcification Skull: Normal. Negative for fracture or focal lesion. Sinuses/Orbits: No acute finding. Other: None IMPRESSION: 1. Negative for acute intracranial hemorrhage. 2. Atrophy and chronic small vessel ischemic changes of the white matter. Age indeterminate small vessel disease versus lacunar infarct in the left basal ganglia. Electronically Signed   By: Donavan Foil M.D.   On: 01/04/2022 19:09    Procedures Procedures   Medications Ordered in ED Medications - No data to display  ED Course/ Medical Decision Making/ A&P                           Medical Decision Making Amount and/or Complexity of Data  Reviewed Radiology: ordered.   71 year old female presenting after a fall.  Normal knee exam outside of a superficial abrasion.  Neurologic exam normal however patient was sent by outside provider for a CT scan.  This was ordered, viewed and interpreted by me.  I agree with radiology that there are no fractures or intracranial hemorrhages.  Fall appears purely mechanical.  Believe patient is stable for discharge home with strict return precautions and PCP follow-up.  Final Clinical Impression(s) / ED Diagnoses Final diagnoses:  Fall, initial encounter  Contusion of face, initial encounter    Rx / DC Orders ED Discharge Orders     None      Results and diagnoses were explained to the patient. Return precautions discussed in full. Patient had no additional questions and  expressed complete understanding.   This chart was dictated using voice recognition software.  Despite best efforts to proofread,  errors can occur which can change the documentation meaning.    Darliss Ridgel 01/04/22 Verlee Rossetti, MD 01/07/22 [phone]

## 2022-01-04 NOTE — Telephone Encounter (Signed)
PT calls today regarding very recent fall. PT had fell 30-45 minutes prior to calling the office. She had stated she was checking on a friends house and while getting up the steps of the porch she had lost footing and hit both her knee and her head. PT was experiencing bruising and bleeding on their knee, no acknowledgement of swelling. Where PT had hit their head there was also bruising and bleeding as well as swelling. The location of where she hit her head was right above her left eye, onto her eye brow. Stated it was bright red and a bit bigger than a 50 cent piece. I had directed PT to triage to follow up.

## 2022-01-04 NOTE — Discharge Instructions (Signed)
Your CAT scan does not show any signs of bleeding.  There is some brain atrophy which is something that we expected as people age.  You may use Tylenol or ibuprofen for your discomfort.  Please see your primary care doctor next week for reevaluation.  It was a pleasure to meet you and we hope you feel better!

## 2022-01-04 NOTE — ED Triage Notes (Signed)
Patients sandals got caught in a mat. She fell and hit the left side of her forehead on bricks. Also has a minor abrasion to her right knee. Not on a blood thinner.

## 2022-01-09 ENCOUNTER — Ambulatory Visit
Admission: RE | Admit: 2022-01-09 | Discharge: 2022-01-09 | Disposition: A | Discharge status: Home / Self Care | Payer: Medicare PPO | Source: Ambulatory Visit | Attending: Internal Medicine | Admitting: Internal Medicine

## 2022-01-09 DIAGNOSIS — R928 Other abnormal and inconclusive findings on diagnostic imaging of breast: Secondary | ICD-10-CM

## 2022-01-09 DIAGNOSIS — N6012 Diffuse cystic mastopathy of left breast: Secondary | ICD-10-CM | POA: Diagnosis not present

## 2022-01-09 DIAGNOSIS — R921 Mammographic calcification found on diagnostic imaging of breast: Secondary | ICD-10-CM | POA: Diagnosis not present

## 2022-01-09 HISTORY — PX: BREAST BIOPSY: SHX20

## 2022-01-11 LAB — HM MAMMOGRAPHY

## 2022-01-24 ENCOUNTER — Ambulatory Visit: Payer: Medicare PPO | Admitting: Internal Medicine

## 2022-01-24 ENCOUNTER — Encounter: Payer: Self-pay | Admitting: Internal Medicine

## 2022-01-24 VITALS — BP 140/86 | HR 72 | Temp 98.1°F | Ht 71.0 in | Wt 184.0 lb

## 2022-01-24 DIAGNOSIS — M79674 Pain in right toe(s): Secondary | ICD-10-CM | POA: Diagnosis not present

## 2022-01-24 NOTE — Patient Instructions (Addendum)
The toe is likely healing and may continue to heal over the next month. The swelling could happen for the first 6-12 months after the fracture.

## 2022-01-24 NOTE — Progress Notes (Signed)
   Subjective:   Patient ID: Theresa Perez, female    DOB: [birth year], 71 y.o.   MRN: [id number]  HPI The patient is a 71 YO female coming in for injury about 6 weeks ago and x-ray with toe fracture about 4 weeks ago. Overall it is improving some.  Review of Systems  Constitutional: Negative.   HENT: Negative.    Eyes: Negative.   Respiratory:  Negative for cough, chest tightness and shortness of breath.   Cardiovascular:  Negative for chest pain, palpitations and leg swelling.  Gastrointestinal:  Negative for abdominal distention, abdominal pain, constipation, diarrhea, nausea and vomiting.  Musculoskeletal:  Positive for joint swelling and myalgias.  Skin: Negative.   Neurological: Negative.   Psychiatric/Behavioral: Negative.      Objective:  Physical Exam Constitutional:      Appearance: She is well-developed.  HENT:     Head: Normocephalic and atraumatic.  Cardiovascular:     Rate and Rhythm: Normal rate and regular rhythm.  Pulmonary:     Effort: Pulmonary effort is normal. No respiratory distress.     Breath sounds: Normal breath sounds. No wheezing or rales.  Abdominal:     General: Bowel sounds are normal. There is no distension.     Palpations: Abdomen is soft.     Tenderness: There is no abdominal tenderness. There is no rebound.  Musculoskeletal:        General: Tenderness present.     Cervical back: Normal range of motion.     Comments: Right 4th toe with some swelling and tenderness to palpation only. No tenderness in the midfoot or ankle  Skin:    General: Skin is warm and dry.  Neurological:     Mental Status: She is alert and oriented to person, place, and time.     Coordination: Coordination normal.     Vitals:   01/24/22 1111 01/24/22 1118  BP: (!) 140/86 (!) 140/86  Pulse: 72   Temp: 98.1 F (36.7 C)   TempSrc: Oral   SpO2: 95%   Weight: 184 lb (83.5 kg)   Height: '5\' 11"'$  (1.803 m)     Assessment & Plan:  Visit time 20 minutes in face  to face communication with patient and coordination of care, additional 5 minutes spent in record review, coordination or care, ordering tests, communicating/referring to other healthcare professionals, documenting in medical records all on the same day of the visit for total time 25 minutes spent on the visit.

## 2022-01-25 ENCOUNTER — Encounter: Payer: Self-pay | Admitting: Internal Medicine

## 2022-01-25 DIAGNOSIS — M79674 Pain in right toe(s): Secondary | ICD-10-CM | POA: Insufficient documentation

## 2022-01-25 NOTE — Assessment & Plan Note (Signed)
S/P transverse fracture 4th toe distal and about 6 weeks out from injury. At this time does not need to buddy tape etc. Still having some soreness to touch but able to walk and play tennis without difficulty. No impact on balance. Still some swelling advised this could last first 6-12 months after fracture. Try compression sleeve for foot and ankle to help with activity. Make sure to wear supportive shoes.

## 2022-02-18 ENCOUNTER — Other Ambulatory Visit: Payer: Self-pay | Admitting: Internal Medicine

## 2022-02-18 DIAGNOSIS — I10 Essential (primary) hypertension: Secondary | ICD-10-CM

## 2022-02-19 MED ORDER — ATENOLOL-CHLORTHALIDONE 50-25 MG PO TABS: 0.5000 | ORAL_TABLET | Freq: Every day | ORAL | 3 refills | Status: DC

## 2022-02-26 ENCOUNTER — Ambulatory Visit: Payer: Medicare PPO | Admitting: Internal Medicine

## 2022-02-27 ENCOUNTER — Encounter: Payer: Self-pay | Admitting: Family Medicine

## 2022-02-27 ENCOUNTER — Ambulatory Visit (INDEPENDENT_AMBULATORY_CARE_PROVIDER_SITE_OTHER): Payer: Medicare PPO

## 2022-02-27 ENCOUNTER — Ambulatory Visit: Payer: Medicare PPO | Admitting: Family Medicine

## 2022-02-27 VITALS — BP 134/82 | HR 68 | Temp 97.8°F | Ht 71.0 in | Wt 184.0 lb

## 2022-02-27 DIAGNOSIS — R10813 Right lower quadrant abdominal tenderness: Secondary | ICD-10-CM | POA: Diagnosis not present

## 2022-02-27 DIAGNOSIS — R1031 Right lower quadrant pain: Secondary | ICD-10-CM

## 2022-02-27 LAB — POCT URINALYSIS DIPSTICK
Bilirubin, UA: NEGATIVE
Blood, UA: NEGATIVE
Glucose, UA: NEGATIVE
Ketones, UA: NEGATIVE
Leukocytes, UA: NEGATIVE
Nitrite, UA: NEGATIVE
Protein, UA: NEGATIVE
Spec Grav, UA: <=1.005 — AB (ref 1.010–1.025)
Urobilinogen, UA: 0.2 E.U./dL
pH, UA: 7 (ref 5.0–8.0)

## 2022-02-27 LAB — COMPREHENSIVE METABOLIC PANEL
ALT: 22 U/L (ref 0–35)
AST: 22 U/L (ref 0–37)
Albumin: 4.4 g/dL (ref 3.5–5.2)
Alkaline Phosphatase: 46 U/L (ref 39–117)
BUN: 8 mg/dL (ref 6–23)
CO2: 34 mEq/L — ABNORMAL HIGH (ref 19–32)
Calcium: 9.3 mg/dL (ref 8.4–10.5)
Chloride: 98 mEq/L (ref 96–112)
Creatinine, Ser: 0.68 mg/dL (ref 0.40–1.20)
GFR: 88.27 mL/min (ref >60.00)
Glucose, Bld: 86 mg/dL (ref 70–99)
Potassium: 4 mEq/L (ref 3.5–5.1)
Sodium: 141 mEq/L (ref 135–145)
Total Bilirubin: 0.5 mg/dL (ref 0.2–1.2)
Total Protein: 7.1 g/dL (ref 6.0–8.3)

## 2022-02-27 LAB — CBC WITH DIFFERENTIAL/PLATELET
Basophils Absolute: 0.1 10*3/uL (ref 0.0–0.1)
Basophils Relative: 0.8 % (ref 0.0–3.0)
Eosinophils Absolute: 0.3 10*3/uL (ref 0.0–0.7)
Eosinophils Relative: 2.9 % (ref 0.0–5.0)
HCT: 39.4 % (ref 36.0–46.0)
Hemoglobin: 13.3 g/dL (ref 12.0–15.0)
Lymphocytes Relative: 32.2 % (ref 12.0–46.0)
Lymphs Abs: 2.8 10*3/uL (ref 0.7–4.0)
MCHC: 33.8 g/dL (ref 30.0–36.0)
MCV: 88.7 fl (ref 78.0–100.0)
Monocytes Absolute: 1 10*3/uL (ref 0.1–1.0)
Monocytes Relative: 11.4 % (ref 3.0–12.0)
Neutro Abs: 4.6 10*3/uL (ref 1.4–7.7)
Neutrophils Relative %: 52.7 % (ref 43.0–77.0)
Platelets: 339 10*3/uL (ref 150.0–400.0)
RBC: 4.44 Mil/uL (ref 3.87–5.11)
RDW: 12.9 % (ref 11.5–15.5)
WBC: 8.8 10*3/uL (ref 4.0–10.5)

## 2022-02-27 NOTE — Patient Instructions (Addendum)
Please go downstairs for labs and an x-ray before you leave.  You will hear from [facility] imaging to schedule a pelvic ultrasound.  I recommend a follow-up with Dr. Sharlet Salina in the next 2 to 4 weeks.

## 2022-02-27 NOTE — Progress Notes (Signed)
Subjective:     Patient ID: Theresa Perez, female    DOB: Jan 11, 1951, 72 y.o.   MRN: [id number]  Chief Complaint  Patient presents with   Pelvic Pain    Started a week ago, sometimes it will radiate to the back, did it a lot at first but not as much now. Dull pain. Gas relief OTC seemed to help some but comes back in the morning.     HPI Patient is in today for a 1 wk hx of lower abdominal pain. Pain is improving. It occasionally radiates to her right lower back.   Denies fever, chills, dizziness, chest pain, palpitations, shortness of breath, abdominal pain, N/V/D, urinary symptoms, LE edema.    Pain is worse with movement.   Gas X and Pepcid help.  Bowel movements are normal.  No blood.   No urinary symptoms.   Hysterectomy in past for benign issues.   Never had a colonoscopy.     Health Maintenance Due  Topic Date Due   COLONOSCOPY (Pts 45-62yr Insurance coverage will need to be confirmed)  Never done    Past Medical History:  Diagnosis Date   Allergy    Anxiety    [place] 1997   Thyroid-Papillary   Complication of anesthesia    vomitting only   Cyst of breast, right, diffuse fibrocystic    Hypertension    Hypothyroidism    PONV (postoperative nausea and vomiting)     Past Surgical History:  Procedure Laterality Date   ANTERIOR AND POSTERIOR REPAIR N/A 03/31/2012   Procedure: ANTERIOR (CYSTOCELE) AND POSTERIOR REPAIR (RECTOCELE);  Surgeon: AGus Height MD;  [facility];  Service: Gynecology;  Laterality: N/A;   aspiration cyst right breast     BREAST BIOPSY Left 01/09/2022   MM LT BREAST BX W LOC DEV 1ST LESION IMAGE BX SPEC STEREO GUIDE 01/09/2022 [facility]   THYROIDECTOMY  1997   TONSILLECTOMY  1969   uterine ablation     VAGINAL HYSTERECTOMY N/A 03/31/2012   Procedure: HYSTERECTOMY VAGINAL;  Surgeon: AGus Height MD;  [facility];  Service: Gynecology;  Laterality: N/A;    Family History  Problem Relation Age of Onset    Diabetes Mother    Deep vein thrombosis Mother        Varicose Veins   Cancer Father        Father died from brain tumor.   Heart disease Father    Colon cancer Neg Hx    Stomach cancer Neg Hx    Esophageal cancer Neg Hx    Rectal cancer Neg Hx     Social History   Socioeconomic History   Marital status: Married    Spouse name: Not on file   Number of children: 1   Years of education: 14   Highest education level: Associate degree: academic program  Occupational History   Not on file  Tobacco Use   Smoking status: Every Day    Packs/day: 0.50    Years: 15.00    Total pack years: 7.50    Types: Cigarettes    Passive exposure: Past   Smokeless tobacco: Never   Tobacco comments:    Occassional smoker  Vaping Use   Vaping Use: Never used  Substance and Sexual Activity   Alcohol use: Yes    Comment: occ beer   Drug use: No   Sexual activity: Yes    Birth control/protection: Post-menopausal  Other Topics Concern   Not on  file  Social History Narrative   Left handed   Drinks caffeine   Two story home   Social Determinants of Health   Financial Resource Strain: Low Risk  (06/12/2021)   Overall Financial Resource Strain (CARDIA)    Difficulty of Paying Living Expenses: Not hard at all  Food Insecurity: No Food Insecurity (06/12/2021)   Hunger Vital Sign    Worried About Running Out of Food in the Last Year: Never true    Ran Out of Food in the Last Year: Never true  Transportation Needs: No Transportation Needs (06/12/2021)   PRAPARE - Hydrologist (Medical): No    Lack of Transportation (Non-Medical): No  Physical Activity: Sufficiently Active (06/12/2021)   Exercise Vital Sign    Days of Exercise per Week: 5 days    Minutes of Exercise per Session: 30 min  Stress: No Stress Concern Present (06/12/2021)   [place]    Feeling of Stress : Not at all  Social Connections:  [place] (06/12/2021)   Social Connection and Isolation Panel [NHANES]    Frequency of Communication with Friends and Family: More than three times a week    Frequency of Social Gatherings with Friends and Family: More than three times a week    Attends Religious Services: More than 4 times per year    Active Member of [employer] or Organizations: Yes    Attends Music therapist: More than 4 times per year    Marital Status: Married  Human resources officer Violence: Not At Risk (06/12/2021)   Humiliation, Afraid, Rape, and Kick questionnaire    Fear of Current or Ex-Partner: No    Emotionally Abused: No    Physically Abused: No    Sexually Abused: No    Outpatient Medications Prior to Visit  Medication Sig Dispense Refill   albuterol (VENTOLIN HFA) 108 (90 Base) MCG/ACT inhaler Inhale 2 puffs into the lungs every 6 (six) hours as needed for wheezing or shortness of breath. 8 g 0   Ascorbic Acid (VITAMIN C PO) Take 1 tablet by mouth daily.     atenolol-chlorthalidone (TENORETIC) 50-25 MG tablet Take 0.5 tablets by mouth daily. 45 tablet 3   Black Cohosh 540 MG CAPS Take 540 mg by mouth 2 (two) times daily.      buPROPion (WELLBUTRIN XL) 150 MG 24 hr tablet Take 1 tablet (150 mg total) by mouth daily. 90 tablet 3   butalbital-acetaminophen-caffeine (ESGIC) 50-325-40 MG tablet Take 1 tablet by mouth 2 (two) times daily as needed for headache. 30 tablet 0   CALCIUM PO Take 1 tablet by mouth daily.     COENZYME Q-10 PO Take 1 tablet by mouth daily.     Cyanocobalamin (VITAMIN B-12 PO) Take 1 tablet by mouth daily.     DULoxetine (CYMBALTA) 60 MG capsule Take 1 capsule (60 mg total) by mouth 2 (two) times daily. 180 capsule 3   ECHINACEA EXTRACT PO Take by mouth.     EVENING PRIMROSE OIL PO Take by mouth.     ferrous sulfate 325 (65 FE) MG tablet Take 325 mg by mouth every evening.      fluticasone (FLONASE) 50 MCG/ACT nasal spray Use 2 spray(s) in each nostril once daily 48 g 3    Ginkgo Biloba (GNP GINGKO BILOBA EXTRACT PO) Take 1 tablet by mouth daily.     levothyroxine (SYNTHROID) 100 MCG tablet Take 1 tablet by  mouth once daily 90 tablet 0   LORazepam (ATIVAN) 0.5 MG tablet TAKE 1 TABLET BY MOUTH ONCE DAILY AS NEEDED FOR ANXIETY 20 tablet 0   Misc Natural Products (GLUCOSAMINE CHOND COMPLEX/MSM) TABS Take 1 tablet by mouth 2 (two) times daily.     Multiple Vitamins-Minerals (MULTIVITAMIN WITH MINERALS) tablet Take 1 tablet by mouth daily.     Nutritional Supplements (DHEA PO) Take 1 tablet by mouth daily.     triamcinolone cream (KENALOG) 0.1 % Apply 1 Application topically 2 (two) times daily. 30 g 0   Turmeric (QC TUMERIC COMPLEX PO) Take by mouth.     No facility-administered medications prior to visit.    Allergies  Allergen Reactions   Permethrin Rash    ROS     Objective:    Physical Exam Constitutional:      General: She is not in acute distress.    Appearance: She is not ill-appearing.  HENT:     Mouth/Throat:     Mouth: Mucous membranes are moist.  Eyes:     Extraocular Movements: Extraocular movements intact.     Conjunctiva/sclera: Conjunctivae normal.     Pupils: Pupils are equal, round, and reactive to light.  Cardiovascular:     Rate and Rhythm: Normal rate and regular rhythm.     Pulses: Normal pulses.  Pulmonary:     Effort: Pulmonary effort is normal.     Breath sounds: Normal breath sounds.  Abdominal:     General: Bowel sounds are decreased. There is no distension.     Palpations: Abdomen is soft.     Tenderness: There is abdominal tenderness in the right lower quadrant. There is no right CVA tenderness, left CVA tenderness, guarding or rebound. Negative signs include Murphy's sign, McBurney's sign and psoas sign.  Musculoskeletal:     Cervical back: Normal range of motion and neck supple.  Skin:    General: Skin is warm and dry.  Neurological:     General: No focal deficit present.     Mental Status: She is alert and  oriented to person, place, and time.  Psychiatric:        Mood and Affect: Mood normal.        Behavior: Behavior normal.        Thought Content: Thought content normal.     BP 134/82 (BP Location: Left Arm, Patient Position: Sitting, Cuff Size: Large)   Pulse 68   Temp 97.8 F (36.6 C) (Temporal)   Ht '5\' 11"'$  (1.803 m)   Wt 184 lb (83.5 kg)   SpO2 98%   BMI 25.66 kg/m  Wt Readings from Last 3 Encounters:  02/27/22 184 lb (83.5 kg)  01/24/22 184 lb (83.5 kg)  12/20/21 183 lb 9.6 oz (83.3 kg)       Assessment & Plan:   Problem List Items Addressed This Visit   None Visit Diagnoses     Right lower quadrant pain    -  Primary   Relevant Orders   POCT urinalysis dipstick (Completed)   CBC with Differential/Platelet (Completed)   Comprehensive metabolic panel (Completed)   US PELVIC COMPLETE WITH TRANSVAGINAL   DG Abd 2 Views (Completed)   Right lower quadrant abdominal tenderness without rebound tenderness       Relevant Orders   POCT urinalysis dipstick (Completed)   CBC with Differential/Platelet (Completed)   Comprehensive metabolic panel (Completed)   US PELVIC COMPLETE WITH TRANSVAGINAL   DG Abd 2 Views (Completed)  No red flag symptoms.  Stat abd XR and labs ordered. Pelvic US also ordered.  UA negative for RBCs or infection.  She is not UTD with colonoscopy.  Reportedly had a partial hysterectomy with ovaries still present.  Follow up pending results. If she worsens she is aware that she will need to follow up here or at an UC or ED.    I am having Genoveva Ill maintain her CALCIUM PO, Black Cohosh, Glucosamine Chond Complex/MSM, Ginkgo Biloba (GNP GINGKO BILOBA EXTRACT PO), multivitamin with minerals, Cyanocobalamin (VITAMIN B-12 PO), Nutritional Supplements (DHEA PO), ferrous sulfate, COENZYME Q-10 PO, Ascorbic Acid (VITAMIN C PO), ECHINACEA EXTRACT PO, Turmeric (QC TUMERIC COMPLEX PO), EVENING PRIMROSE OIL PO, butalbital-acetaminophen-caffeine,  albuterol, LORazepam, buPROPion, DULoxetine, fluticasone, triamcinolone cream, levothyroxine, and atenolol-chlorthalidone.  No orders of the defined types were placed in this encounter.

## 2022-03-02 IMAGING — MG DIGITAL SCREENING BILAT W/ TOMO W/ CAD
8 series · 8 of 24 positions shown · non-contrast
Comparison: Previous exam(s).

CLINICAL DATA: Screening.

EXAM:
DIGITAL SCREENING BILATERAL MAMMOGRAM WITH TOMO AND CAD

[R CC synth-2D]
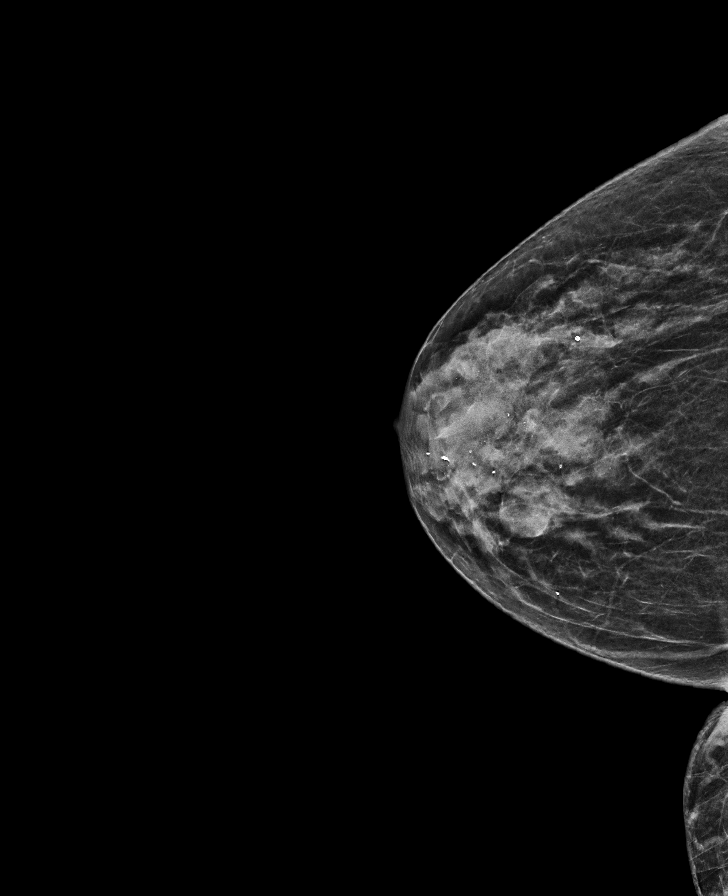

[L MLO synth-2D]
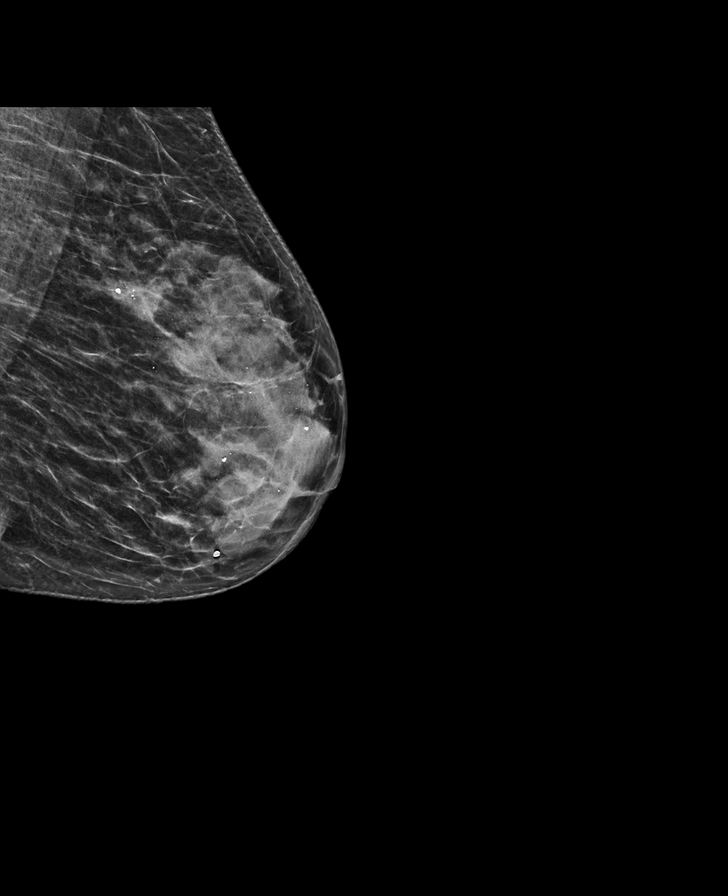

[R MLO synth-2D]
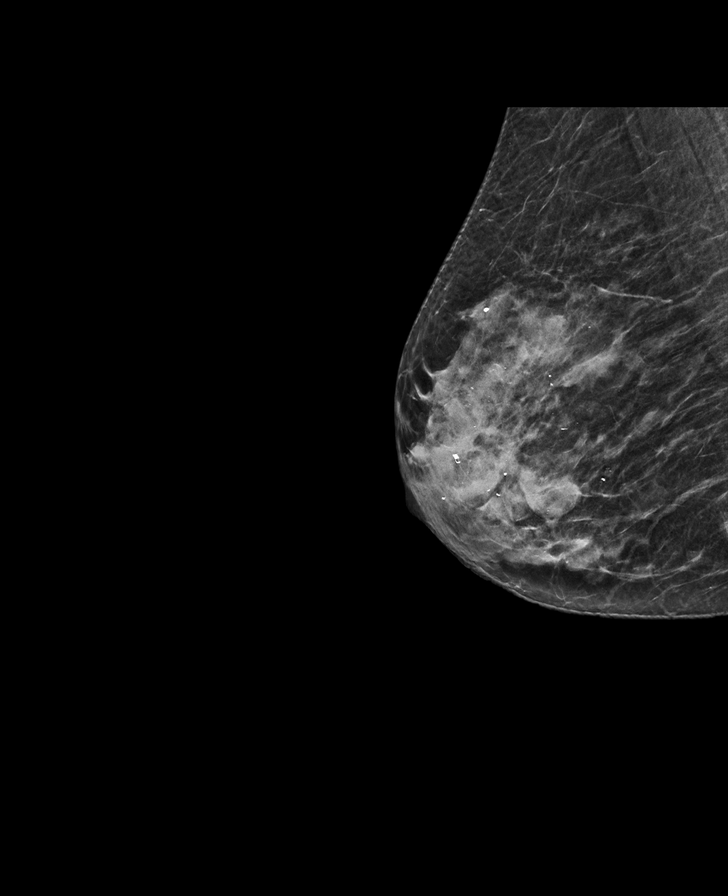

[L CC synth-2D]
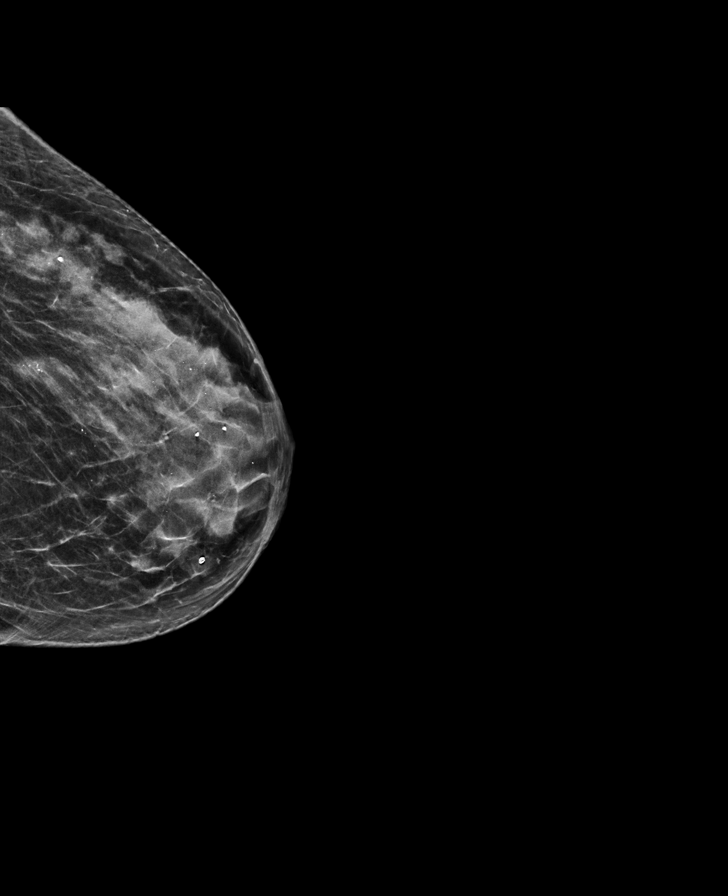

[L CC tomo · tomo slice 29/56.0]
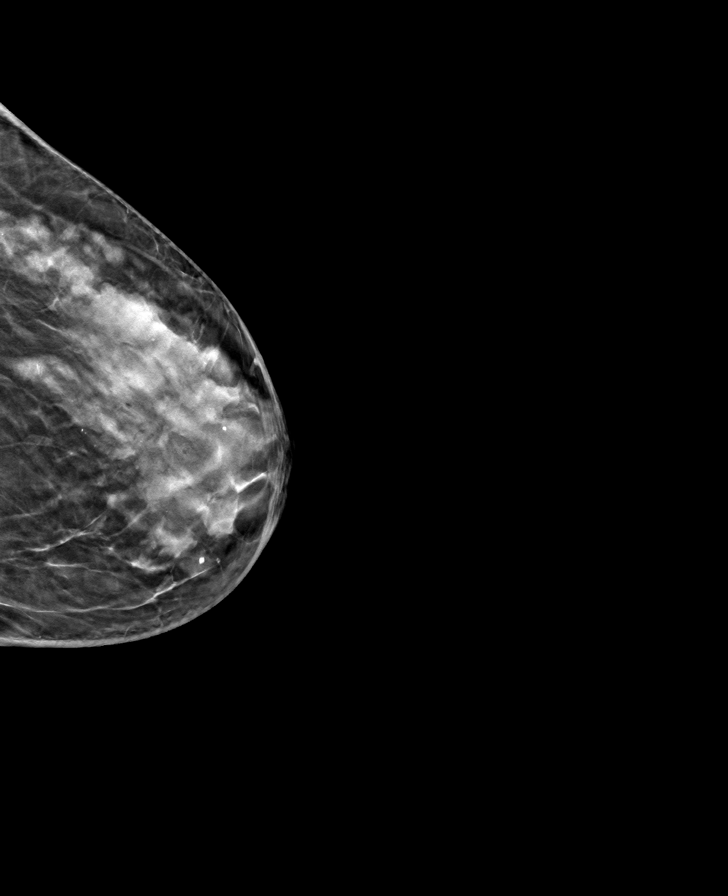

[R MLO tomo · tomo slice 26/51.0]
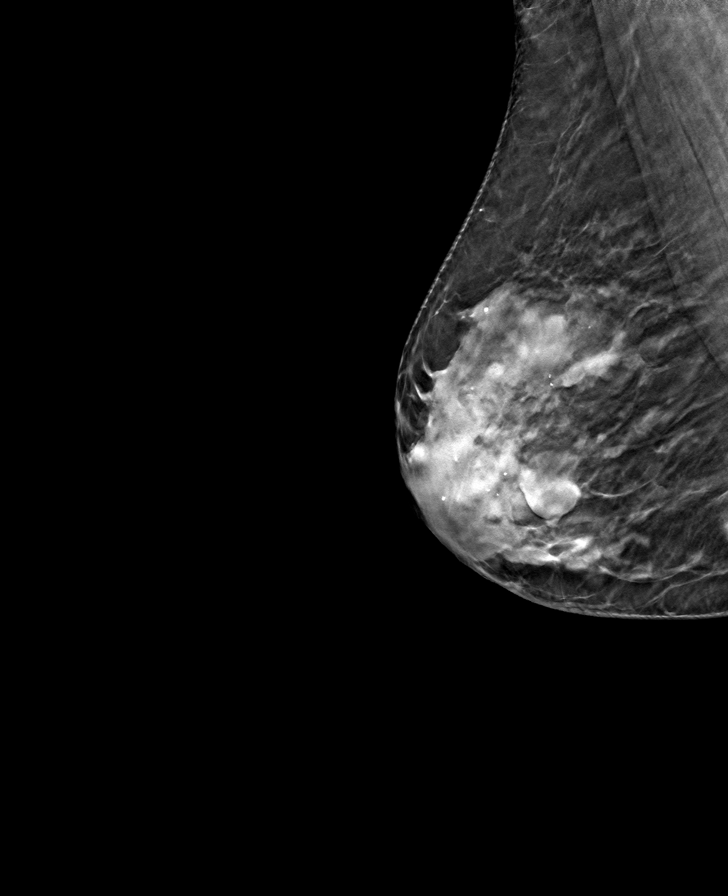

[R CC tomo · tomo slice 25/49.0]
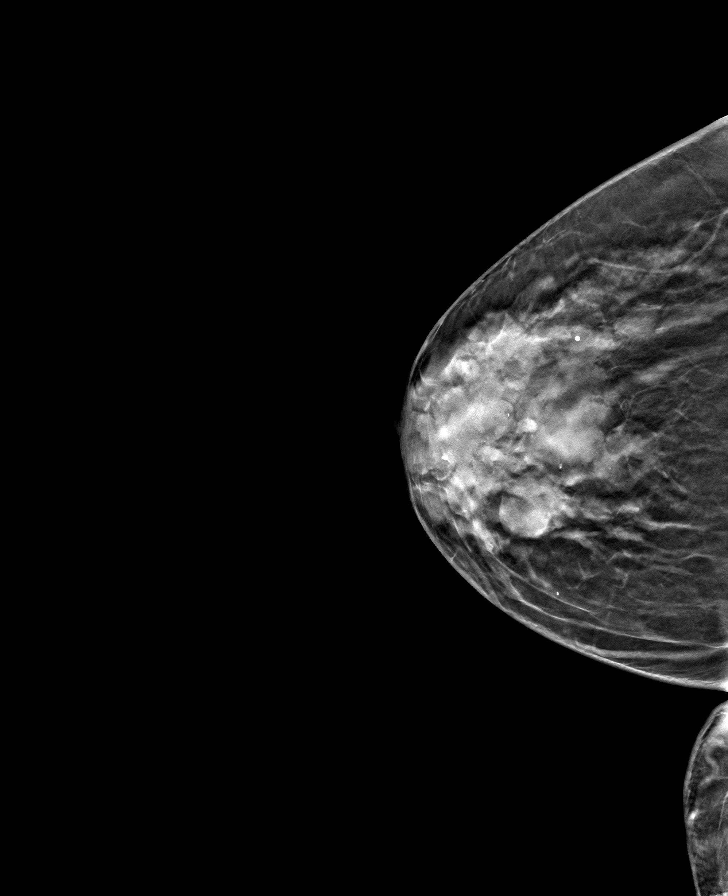

[L MLO tomo · tomo slice 27/52.0]
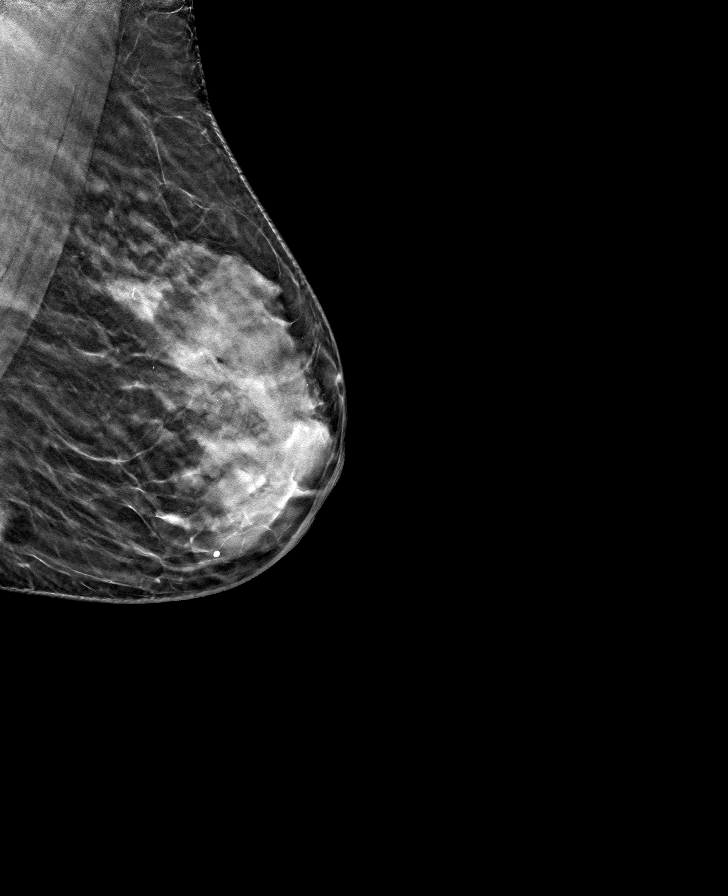

[8 of 24 positions shown; findings below may reference images not displayed]

ACR Breast Density Category c: The breast tissue is heterogeneously
dense, which may obscure small masses.
FINDINGS: There are no findings suspicious for malignancy. Images were
processed with CAD.
IMPRESSION: No mammographic evidence of malignancy. A result letter of this
screening mammogram will be mailed directly to the patient.

RECOMMENDATION:
Screening mammogram in one year. (Code:FT-U-LHB)

BI-RADS CATEGORY  1: Negative.

## 2022-03-18 ENCOUNTER — Ambulatory Visit: Payer: Medicare PPO | Admitting: Internal Medicine

## 2022-03-19 ENCOUNTER — Ambulatory Visit
Admission: RE | Admit: 2022-03-19 | Discharge: 2022-03-19 | Disposition: A | Discharge status: Home / Self Care | Payer: Medicare PPO | Source: Ambulatory Visit | Attending: Family Medicine | Admitting: Family Medicine

## 2022-03-19 DIAGNOSIS — R1031 Right lower quadrant pain: Secondary | ICD-10-CM

## 2022-03-19 DIAGNOSIS — Z780 Asymptomatic menopausal state: Secondary | ICD-10-CM | POA: Diagnosis not present

## 2022-03-19 DIAGNOSIS — N83201 Unspecified ovarian cyst, right side: Secondary | ICD-10-CM | POA: Diagnosis not present

## 2022-03-19 DIAGNOSIS — Z90710 Acquired absence of both cervix and uterus: Secondary | ICD-10-CM | POA: Diagnosis not present

## 2022-03-19 DIAGNOSIS — R10813 Right lower quadrant abdominal tenderness: Secondary | ICD-10-CM

## 2022-03-19 NOTE — Progress Notes (Signed)
Please make sure she gets my message and see how she is feeling now.

## 2022-03-25 ENCOUNTER — Telehealth: Payer: Self-pay | Admitting: Internal Medicine

## 2022-03-25 ENCOUNTER — Other Ambulatory Visit: Payer: Self-pay | Admitting: Internal Medicine

## 2022-03-25 DIAGNOSIS — N83209 Unspecified ovarian cyst, unspecified side: Secondary | ICD-10-CM

## 2022-03-25 DIAGNOSIS — E890 Postprocedural hypothyroidism: Secondary | ICD-10-CM

## 2022-03-25 NOTE — Telephone Encounter (Signed)
Sent pt mychart message to schedule f/u with Dr. Sharlet Salina so new orders can be placed.

## 2022-03-25 NOTE — Telephone Encounter (Signed)
Cyst was found on pelvic US and informed to f/u in 3-6 months to recheck the status. [facility] requesting another order be placed for future, is this something you do since you did initial or do I need to forward to Dr. Sharlet Salina? Pt does not have an appt with Dr. Sharlet Salina scheduled at this time.

## 2022-03-25 NOTE — Telephone Encounter (Signed)
PT calls today in regards to results from their pelvic ultrasound. PT was informed of cyst found on her right ovary. [facility] had let PT know to follow up with them in 3-6 months to check the status of the cyst.   For the follow up to be scheduled [facility] is requesting another referral be placed. NP Raenette Rover is the one who had placed the orders for the imaging but the PT is established through [place] so I'm not positive as to who would handle the referral.  CB: [phone]

## 2022-04-01 NOTE — Telephone Encounter (Signed)
Patient requesting you order a future Pelvic Ultrasound for 3-4 months before scheduling an appointment to discuss

## 2022-04-02 ENCOUNTER — Ambulatory Visit: Payer: Medicare PPO | Admitting: Internal Medicine

## 2022-04-02 NOTE — Addendum Note (Signed)
Addended by: Pricilla Holm A on: 04/02/2022 02:00 PM   Modules accepted: Orders

## 2022-04-02 NOTE — Telephone Encounter (Signed)
Replied to pt msg letting her know MD ordered ultrasound.Marland KitchenJohny Perez

## 2022-04-02 NOTE — Telephone Encounter (Signed)
Ordered

## 2022-05-01 ENCOUNTER — Ambulatory Visit (INDEPENDENT_AMBULATORY_CARE_PROVIDER_SITE_OTHER): Payer: Medicare PPO

## 2022-05-01 ENCOUNTER — Ambulatory Visit: Payer: Medicare PPO | Admitting: Physician Assistant

## 2022-05-01 ENCOUNTER — Encounter: Payer: Self-pay | Admitting: Physician Assistant

## 2022-05-01 DIAGNOSIS — M25562 Pain in left knee: Secondary | ICD-10-CM

## 2022-05-01 DIAGNOSIS — M25561 Pain in right knee: Secondary | ICD-10-CM | POA: Diagnosis not present

## 2022-05-01 DIAGNOSIS — G8929 Other chronic pain: Secondary | ICD-10-CM | POA: Diagnosis not present

## 2022-05-01 NOTE — Progress Notes (Signed)
Office Visit Note   Patient: Theresa Perez           Date of Birth: [birth year]           MRN: [id number] Visit Date: 05/01/2022              Requested by: Hoyt Koch, MD [address] [place],  [place] [address] PCP: Hoyt Koch, MD  No chief complaint on file.     HPI: Patient is a very pleasant 72 year old woman who is extremely active.  She has a long history of right knee arthritis and some hamstring problems with her left knee.  She previously has been seen by Dr. Durward Fortes who was injected her right knee.  She says the injections have worked quite well.  She said about a week ago she was at a concert where she stood for quite a while first on her feet on hard ground.  She noticed swelling in her left knee after that.  On the right knee she did have some increased pain associated she thinks with her arthritis on the left she had more pain initially in the posterior knee but then up into her thigh consistent with a hamstring strain she had previously.  Assessment & Plan: Visit Diagnoses:  1. Acute pain of left knee   2. Chronic pain of right knee     Plan: We agreed with her I see no acute changes on x-rays.  She does not feel like her knee is painful enough to have an injection today.  Certainly if she wants 1 she can contact me at any time with regards to her left she does seem more tender over the hamstring but she does not have any bruising.  She says her husband to give her some stretches and she is going to continue to do those certainly can come back in a couple weeks if she is not doing well  Follow-Up Instructions: No follow-ups on file.   Ortho Exam  Patient is alert, oriented, no adenopathy, well-dressed, normal affect, normal respiratory effort. Right knee she has no redness no erythema no effusion.  She has good flexion extension though quite a bit of crepitus.  Good varus valgus stability she is neurovascular intact Left knee no effusion.   She does have some tenderness posteriorly into the hamstring distally compartments are soft and nontender.  No real tenderness over the medial lateral joint line neurovascular intact  Imaging: XR KNEE 3 VIEW RIGHT  Result Date: 05/01/2022 Three-view radiographs of the right knee were obtained today.  No acute changes.  She does have tricompartmental arthritis most notably in the medial compartment with periarticular osteophytes and sclerotic changes  XR KNEE 3 VIEW LEFT  Result Date: 05/01/2022 Three-view radiographs of her left knee were obtained today.  She has tricompartmental arthritis.  She has periarticular osteophytes and sclerotic changes no acute changes noted  No images are attached to the encounter.  Labs: Lab Results  Component Value Date   HGBA1C 5.6 05/08/2020     Lab Results  Component Value Date   ALBUMIN 4.4 02/27/2022   ALBUMIN 4.6 06/13/2021   ALBUMIN 4.4 05/08/2020    No results found for: "MG" Lab Results  Component Value Date   VD25OH 25.82 (L) 05/08/2020   VD25OH 31.06 05/26/2017    No results found for: "PREALBUMIN"    Latest Ref Rng & Units 02/27/2022    3:16 PM 06/13/2021   10:27 AM 05/08/2020  11:53 AM  CBC EXTENDED  WBC 4.0 - 10.5 K/uL 8.8  5.5  7.1   RBC 3.87 - 5.11 Mil/uL 4.44  4.57  4.35   Hemoglobin 12.0 - 15.0 g/dL 13.3  13.9  13.3   HCT 36.0 - 46.0 % 39.4  41.0  38.9   Platelets 150.0 - 400.0 K/uL 339.0  315.0  341.0   NEUT# 1.4 - 7.7 K/uL 4.6     Lymph# 0.7 - 4.0 K/uL 2.8        There is no height or weight on file to calculate BMI.  Orders:  Orders Placed This Encounter  Procedures   XR KNEE 3 VIEW LEFT   XR KNEE 3 VIEW RIGHT   No orders of the defined types were placed in this encounter.    Procedures: No procedures performed  Clinical Data: No additional findings.  ROS:  All other systems negative, except as noted in the HPI. Review of Systems  Objective: Vital Signs: There were no vitals taken for this  visit.  Specialty Comments:  No specialty comments available.  PMFS History: Patient Active Problem List   Diagnosis Date Noted   Toe pain, right 01/25/2022   CAP (community acquired pneumonia) 08/08/2021   Bilateral primary osteoarthritis of knee 06/14/2021   Adjustment disorder with mixed anxiety and depressed mood 08/02/2019   Memory change 06/25/2019   Smokers' cough ([place]) 12/11/2017   De Quervain's disease (radial styloid tenosynovitis) 04/01/2017   Arthritis of left knee 02/22/2015   Routine general medical examination at a health care facility 12/16/2014   Constipation 07/13/2014   Papillary thyroid carcinoma ([place]) 03/23/2014   Pure hypercholesterolemia 12/08/2013   Varicose veins of lower extremities with other complications [id number]   Bilateral knee pain 08/19/2010   Essential hypertension 05/19/2007   Allergic rhinitis 05/19/2007   Past Medical History:  Diagnosis Date   Allergy    Anxiety    Cancer ([place]) 1997   Thyroid-Papillary   Complication of anesthesia    vomitting only   Cyst of breast, right, diffuse fibrocystic    Hypertension    Hypothyroidism    PONV (postoperative nausea and vomiting)     Family History  Problem Relation Age of Onset   Diabetes Mother    Deep vein thrombosis Mother        Varicose Veins   Cancer Father        Father died from brain tumor.   Heart disease Father    Colon cancer Neg Hx    Stomach cancer Neg Hx    Esophageal cancer Neg Hx    Rectal cancer Neg Hx     Past Surgical History:  Procedure Laterality Date   ANTERIOR AND POSTERIOR REPAIR N/A 03/31/2012   Procedure: ANTERIOR (CYSTOCELE) AND POSTERIOR REPAIR (RECTOCELE);  Surgeon: Gus Height, MD;  Location: [facility];  Service: Gynecology;  Laterality: N/A;   aspiration cyst right breast     BREAST BIOPSY Left 01/09/2022   MM LT BREAST BX W LOC DEV 1ST LESION IMAGE BX SPEC STEREO GUIDE 01/09/2022 [facility]   THYROIDECTOMY  1997   TONSILLECTOMY  1969    uterine ablation     VAGINAL HYSTERECTOMY N/A 03/31/2012   Procedure: HYSTERECTOMY VAGINAL;  Surgeon: Gus Height, MD;  Location: [facility];  Service: Gynecology;  Laterality: N/A;   Social History   Occupational History   Not on file  Tobacco Use   Smoking status: Every Day    Packs/day: 0.50  Years: 15.00    Total pack years: 7.50    Types: Cigarettes    Passive exposure: Past   Smokeless tobacco: Never   Tobacco comments:    Occassional smoker  Vaping Use   Vaping Use: Never used  Substance and Sexual Activity   Alcohol use: Yes    Comment: occ beer   Drug use: No   Sexual activity: Yes    Birth control/protection: Post-menopausal

## 2022-06-16 ENCOUNTER — Other Ambulatory Visit: Payer: Self-pay | Admitting: Internal Medicine

## 2022-06-16 DIAGNOSIS — E890 Postprocedural hypothyroidism: Secondary | ICD-10-CM

## 2022-06-19 ENCOUNTER — Other Ambulatory Visit: Payer: Medicare PPO

## 2022-06-20 ENCOUNTER — Other Ambulatory Visit: Payer: Self-pay | Admitting: Internal Medicine

## 2022-06-20 DIAGNOSIS — E890 Postprocedural hypothyroidism: Secondary | ICD-10-CM

## 2022-06-20 MED ORDER — LEVOTHYROXINE SODIUM 100 MCG PO TABS: 100.0000 ug | ORAL_TABLET | Freq: Every day | ORAL | 0 refills | Status: DC

## 2022-06-24 ENCOUNTER — Encounter: Payer: Medicare PPO | Admitting: Internal Medicine

## 2022-07-22 ENCOUNTER — Ambulatory Visit (INDEPENDENT_AMBULATORY_CARE_PROVIDER_SITE_OTHER): Payer: Medicare PPO

## 2022-07-22 VITALS — Ht 71.0 in | Wt 185.0 lb

## 2022-07-22 DIAGNOSIS — Z0000 Encounter for general adult medical examination without abnormal findings: Secondary | ICD-10-CM

## 2022-07-22 NOTE — Patient Instructions (Addendum)
Theresa Perez , Thank you for taking time to come for your Medicare Wellness Visit. I appreciate your ongoing commitment to your health goals. Please review the following plan we discussed and let me know if I can assist you in the future.   These are the goals we discussed:  Goals      My goal for 2024 is to maintain my health and go to gym more often.        This is a list of the screening recommended for you and due dates:  Health Maintenance  Topic Date Due   Colon Cancer Screening  Never done   COVID-19 Vaccine (3 - 2023-24 season) 10/19/2021   Flu Shot  09/19/2022   Medicare Annual Wellness Visit  07/22/2023   Mammogram  01/12/2024   DTaP/Tdap/Td vaccine (3 - Td or Tdap) 12/27/2025   Pneumonia Vaccine  Completed   DEXA scan (bone density measurement)  Completed   Hepatitis C Screening  Completed   Zoster (Shingles) Vaccine  Completed   HPV Vaccine  Aged Out    Advanced directives: No  Conditions/risks identified: Yes  Next appointment: Follow up in one year for your annual wellness visit.   Preventive Care 29 Years and Older, Female Preventive care refers to lifestyle choices and visits with your health care provider that can promote health and wellness. What does preventive care include? A yearly physical exam. This is also called an annual well check. Dental exams once or twice a year. Routine eye exams. Ask your health care provider how often you should have your eyes checked. Personal lifestyle choices, including: Daily care of your teeth and gums. Regular physical activity. Eating a healthy diet. Avoiding tobacco and drug use. Limiting alcohol use. Practicing safe sex. Taking low-dose aspirin every day. Taking vitamin and mineral supplements as recommended by your health care provider. What happens during an annual well check? The services and screenings done by your health care provider during your annual well check will depend on your age, overall health,  lifestyle risk factors, and family history of disease. Counseling  Your health care provider may ask you questions about your: Alcohol use. Tobacco use. Drug use. Emotional well-being. Home and relationship well-being. Sexual activity. Eating habits. History of falls. Memory and ability to understand (cognition). Work and work Astronomer. Reproductive health. Screening  You may have the following tests or measurements: Height, weight, and BMI. Blood pressure. Lipid and cholesterol levels. These may be checked every 5 years, or more frequently if you are over 49 years old. Skin check. Lung cancer screening. You may have this screening every year starting at age 38 if you have a 30-pack-year history of smoking and currently smoke or have quit within the past 15 years. Fecal occult blood test (FOBT) of the stool. You may have this test every year starting at age 12. Flexible sigmoidoscopy or colonoscopy. You may have a sigmoidoscopy every 5 years or a colonoscopy every 10 years starting at age 78. Hepatitis C blood test. Hepatitis B blood test. Sexually transmitted disease (STD) testing. Diabetes screening. This is done by checking your blood sugar (glucose) after you have not eaten for a while (fasting). You may have this done every 1-3 years. Bone density scan. This is done to screen for osteoporosis. You may have this done starting at age 72. Mammogram. This may be done every 1-2 years. Talk to your health care provider about how often you should have regular mammograms. Talk with your health care  provider about your test results, treatment options, and if necessary, the need for more tests. Vaccines  Your health care provider may recommend certain vaccines, such as: Influenza vaccine. This is recommended every year. Tetanus, diphtheria, and acellular pertussis (Tdap, Td) vaccine. You may need a Td booster every 10 years. Zoster vaccine. You may need this after age  56. Pneumococcal 13-valent conjugate (PCV13) vaccine. One dose is recommended after age 47. Pneumococcal polysaccharide (PPSV23) vaccine. One dose is recommended after age 12. Talk to your health care provider about which screenings and vaccines you need and how often you need them. This information is not intended to replace advice given to you by your health care provider. Make sure you discuss any questions you have with your health care provider. Document Released: 03/03/2015 Document Revised: 10/25/2015 Document Reviewed: 12/06/2014 Elsevier Interactive Patient Education  2017 [employer].  Fall Prevention in the Home Falls can cause injuries. They can happen to people of all ages. There are many things you can do to make your home safe and to help prevent falls. What can I do on the outside of my home? Regularly fix the edges of walkways and driveways and fix any cracks. Remove anything that might make you trip as you walk through a door, such as a raised step or threshold. Trim any bushes or trees on the path to your home. Use bright outdoor lighting. Clear any walking paths of anything that might make someone trip, such as rocks or tools. Regularly check to see if handrails are loose or broken. Make sure that both sides of any steps have handrails. Any raised decks and porches should have guardrails on the edges. Have any leaves, snow, or ice cleared regularly. Use sand or salt on walking paths during winter. Clean up any spills in your garage right away. This includes oil or grease spills. What can I do in the bathroom? Use night lights. Install grab bars by the toilet and in the tub and shower. Do not use towel bars as grab bars. Use non-skid mats or decals in the tub or shower. If you need to sit down in the shower, use a plastic, non-slip stool. Keep the floor dry. Clean up any water that spills on the floor as soon as it happens. Remove soap buildup in the tub or shower  regularly. Attach bath mats securely with double-sided non-slip rug tape. Do not have throw rugs and other things on the floor that can make you trip. What can I do in the bedroom? Use night lights. Make sure that you have a light by your bed that is easy to reach. Do not use any sheets or blankets that are too big for your bed. They should not hang down onto the floor. Have a firm chair that has side arms. You can use this for support while you get dressed. Do not have throw rugs and other things on the floor that can make you trip. What can I do in the kitchen? Clean up any spills right away. Avoid walking on wet floors. Keep items that you use a lot in easy-to-reach places. If you need to reach something above you, use a strong step stool that has a grab bar. Keep electrical cords out of the way. Do not use floor polish or wax that makes floors slippery. If you must use wax, use non-skid floor wax. Do not have throw rugs and other things on the floor that can make you trip. What can I  do with my stairs? Do not leave any items on the stairs. Make sure that there are handrails on both sides of the stairs and use them. Fix handrails that are broken or loose. Make sure that handrails are as long as the stairways. Check any carpeting to make sure that it is firmly attached to the stairs. Fix any carpet that is loose or worn. Avoid having throw rugs at the top or bottom of the stairs. If you do have throw rugs, attach them to the floor with carpet tape. Make sure that you have a light switch at the top of the stairs and the bottom of the stairs. If you do not have them, ask someone to add them for you. What else can I do to help prevent falls? Wear shoes that: Do not have high heels. Have rubber bottoms. Are comfortable and fit you well. Are closed at the toe. Do not wear sandals. If you use a stepladder: Make sure that it is fully opened. Do not climb a closed stepladder. Make sure that  both sides of the stepladder are locked into place. Ask someone to hold it for you, if possible. Clearly mark and make sure that you can see: Any grab bars or handrails. First and last steps. Where the edge of each step is. Use tools that help you move around (mobility aids) if they are needed. These include: Canes. Walkers. Scooters. Crutches. Turn on the lights when you go into a dark area. Replace any light bulbs as soon as they burn out. Set up your furniture so you have a clear path. Avoid moving your furniture around. If any of your floors are uneven, fix them. If there are any pets around you, be aware of where they are. Review your medicines with your doctor. Some medicines can make you feel dizzy. This can increase your chance of falling. Ask your doctor what other things that you can do to help prevent falls. This information is not intended to replace advice given to you by your health care provider. Make sure you discuss any questions you have with your health care provider. Document Released: 12/01/2008 Document Revised: 07/13/2015 Document Reviewed: 03/11/2014 Elsevier Interactive Patient Education  2017 [employer].

## 2022-07-22 NOTE — Progress Notes (Signed)
I connected with  Jonelle Sports on 07/22/22 by a audio enabled telemedicine application and verified that I am speaking with the correct person using two identifiers.  Patient Location: Home  Provider Location: Office/Clinic  I discussed the limitations of evaluation and management by telemedicine. The patient expressed understanding and agreed to proceed.  Subjective:   Theresa Perez is a 72 y.o. female who presents for Medicare Annual (Subsequent) preventive examination.  Review of Systems     Cardiac Risk Factors include: advanced age (>30men, >64 women);dyslipidemia;family history of premature cardiovascular disease;hypertension     Objective:    Today's Vitals   07/22/22 1505  Weight: 185 lb (83.9 kg)  Height: 5\' 11"  (1.803 m)  PainSc: 0-No pain   Body mass index is 25.8 kg/m.     07/22/2022    3:09 PM 01/04/2022    5:45 PM 06/12/2021    8:50 AM 12/11/2020    1:36 PM 08/16/2019   10:28 AM 11/17/2013    2:46 PM 03/31/2012    2:38 PM  Advanced Directives  Does Patient Have a Medical Advance Directive? No No No No No No Patient does not have advance directive  Would patient like information on creating a medical advance directive? No - Patient declined No - Patient declined Yes (ED - Information included in AVS)  Yes (MAU/Ambulatory/Procedural Areas - Information given)    Pre-existing out of facility DNR order (yellow form or pink MOST form)       No    Current Medications (verified) Outpatient Encounter Medications as of 07/22/2022  Medication Sig   albuterol (VENTOLIN HFA) 108 (90 Base) MCG/ACT inhaler Inhale 2 puffs into the lungs every 6 (six) hours as needed for wheezing or shortness of breath.   Ascorbic Acid (VITAMIN C PO) Take 1 tablet by mouth daily.   atenolol-chlorthalidone (TENORETIC) 50-25 MG tablet Take 0.5 tablets by mouth daily.   Black Cohosh 540 MG CAPS Take 540 mg by mouth 2 (two) times daily.    buPROPion (WELLBUTRIN XL) 150 MG 24 hr tablet Take  1 tablet (150 mg total) by mouth daily.   butalbital-acetaminophen-caffeine (ESGIC) 50-325-40 MG tablet Take 1 tablet by mouth 2 (two) times daily as needed for headache.   CALCIUM PO Take 1 tablet by mouth daily.   COENZYME Q-10 PO Take 1 tablet by mouth daily.   Cyanocobalamin (VITAMIN B-12 PO) Take 1 tablet by mouth daily.   DULoxetine (CYMBALTA) 60 MG capsule Take 1 capsule (60 mg total) by mouth 2 (two) times daily.   ECHINACEA EXTRACT PO Take by mouth.   EVENING PRIMROSE OIL PO Take by mouth.   ferrous sulfate 325 (65 FE) MG tablet Take 325 mg by mouth every evening.    fluticasone (FLONASE) 50 MCG/ACT nasal spray Use 2 spray(s) in each nostril once daily   Ginkgo Biloba (GNP GINGKO BILOBA EXTRACT PO) Take 1 tablet by mouth daily.   levothyroxine (SYNTHROID) 100 MCG tablet Take 1 tablet (100 mcg total) by mouth daily. Annual appt is due must see provider for future refills   LORazepam (ATIVAN) 0.5 MG tablet TAKE 1 TABLET BY MOUTH ONCE DAILY AS NEEDED FOR ANXIETY   Misc Natural Products (GLUCOSAMINE CHOND COMPLEX/MSM) TABS Take 1 tablet by mouth 2 (two) times daily.   Multiple Vitamins-Minerals (MULTIVITAMIN WITH MINERALS) tablet Take 1 tablet by mouth daily.   Nutritional Supplements (DHEA PO) Take 1 tablet by mouth daily.   triamcinolone cream (KENALOG) 0.1 % Apply 1 Application topically  2 (two) times daily.   Turmeric (QC TUMERIC COMPLEX PO) Take by mouth.   No facility-administered encounter medications on file as of 07/22/2022.    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   Complication of anesthesia    vomitting only   Cyst of breast, right, diffuse fibrocystic    Hypertension    Hypothyroidism    PONV (postoperative nausea and vomiting)    Past Surgical History:  Procedure Laterality Date   ANTERIOR AND POSTERIOR REPAIR N/A 03/31/2012   Procedure: ANTERIOR  (CYSTOCELE) AND POSTERIOR REPAIR (RECTOCELE);  Surgeon: Miguel Aschoff, MD;  Location: [facility];  Service: Gynecology;  Laterality: N/A;   aspiration cyst right breast     BREAST BIOPSY Left 01/09/2022   MM LT BREAST BX W LOC DEV 1ST LESION IMAGE BX SPEC STEREO GUIDE 01/09/2022 [facility]   THYROIDECTOMY  1997   TONSILLECTOMY  1969   uterine ablation     VAGINAL HYSTERECTOMY N/A 03/31/2012   Procedure: HYSTERECTOMY VAGINAL;  Surgeon: Miguel Aschoff, MD;  Location: [facility];  Service: Gynecology;  Laterality: N/A;   Family History  Problem Relation Age of Onset   Diabetes Mother    Deep vein thrombosis Mother        Varicose Veins   Cancer Father        Father died from brain tumor.   Heart disease Father    Colon cancer Neg Hx    Stomach cancer Neg Hx    Esophageal cancer Neg Hx    Rectal cancer Neg Hx    Social History   Socioeconomic History   Marital status: Married    Spouse name: Not on file   Number of children: 1   Years of education: 14   Highest education level: Associate degree: academic program  Occupational History   Not on file  Tobacco Use   Smoking status: Every Day    Packs/day: 0.50    Years: 15.00    Additional pack years: 0.00    Total pack years: 7.50    Types: Cigarettes    Passive exposure: Past   Smokeless tobacco: Never   Tobacco comments:    Occassional smoker  Vaping Use   Vaping Use: Never used  Substance and Sexual Activity   Alcohol use: Yes    Comment: occ beer   Drug use: No   Sexual activity: Yes    Birth control/protection: Post-menopausal  Other Topics Concern   Not on file  Social History Narrative   Left handed   Drinks caffeine   Two story home   Social Determinants of Health   Financial Resource Strain: Low Risk  (07/22/2022)   Overall Financial Resource Strain (CARDIA)    Difficulty of Paying Living Expenses: Not hard at all  Food Insecurity: No Food Insecurity (07/22/2022)   Hunger Vital Sign    Worried About Running Out  of Food in the Last Year: Never true    Ran Out of Food in the Last Year: Never true  Transportation Needs: No Transportation Needs (07/22/2022)   PRAPARE - Administrator, Civil Service (Medical): No    Lack of Transportation (Non-Medical): No  Physical Activity: Sufficiently Active (07/22/2022)   Exercise Vital Sign    Days of Exercise per Week: 5 days    Minutes of Exercise per Session: 60 min  Stress: No  Stress Concern Present (07/22/2022)   [employer] of Occupational Health - Occupational Stress Questionnaire    Feeling of Stress : Not at all  Social Connections: Socially Integrated (07/22/2022)   Social Connection and Isolation Panel [NHANES]    Frequency of Communication with Friends and Family: More than three times a week    Frequency of Social Gatherings with Friends and Family: More than three times a week    Attends Religious Services: More than 4 times per year    Active Member of [employer] or Organizations: Yes    Attends Engineer, structural: More than 4 times per year    Marital Status: Married    Tobacco Counseling Ready to quit: Not Answered Counseling given: Not Answered Tobacco comments: Occassional smoker   Clinical Intake:  Pre-visit preparation completed: Yes  Pain : No/denies pain Pain Score: 0-No pain     BMI - recorded: 25.8 Nutritional Status: BMI 25 -29 Overweight Nutritional Risks: None Diabetes: No  How often do you need to have someone help you when you read instructions, pamphlets, or other written materials from your doctor or pharmacy?: 1 - Never What is the last grade level you completed in school?: 14 years  Diabetic? No  Interpreter Needed?: No  Information entered by :: Fenix Rorke N. Armandina Iman, LPN.   Activities of Daily Living    07/22/2022    3:13 PM  In your present state of health, do you have any difficulty performing the following activities:  Hearing? 0  Vision? 0  Difficulty concentrating or making  decisions? 0  Walking or climbing stairs? 0  Dressing or bathing? 0  Doing errands, shopping? 0  Preparing Food and eating ? N  Using the Toilet? N  In the past six months, have you accidently leaked urine? N  Do you have problems with loss of bowel control? N  Managing your Medications? N  Managing your Finances? N  Housekeeping or managing your Housekeeping? N    Patient Care Team: Myrlene Broker, MD as PCP - General (Internal Medicine)  Indicate any recent Medical Services you may have received from other than [facility] providers in the past year (date may be approximate).     Assessment:   This is a routine wellness examination for [facility].  Hearing/Vision screen Hearing Screening - Comments:: Patient wears hearing aids. Vision Screening - Comments:: Wears rx glasses - up to date with routine eye exams with [facility]   Dietary issues and exercise activities discussed: Current Exercise Habits: Home exercise routine, Type of exercise: walking;Other - see comments (swimming, pickle ball, spec tennis, gym), Time (Minutes): 60, Frequency (Times/Week): 5, Weekly Exercise (Minutes/Week): 300, Intensity: Moderate, Exercise limited by: orthopedic condition(s)   Goals Addressed             This Visit's Progress    My goal for 2024 is to maintain my health and go to gym more often.        Depression Screen    07/22/2022    3:12 PM 01/24/2022   11:12 AM 06/13/2021    9:51 AM 06/12/2021    8:56 AM 02/14/2020    3:05 PM 08/16/2019   10:30 AM 06/25/2019   12:26 PM  PHQ 2/9 Scores  PHQ - 2 Score 0 0 0 0 0 2 2  PHQ- 9 Score 0 0 0    3    Fall Risk    07/22/2022    3:12 PM 01/24/2022  11:12 AM 06/12/2021    8:52 AM 02/14/2020    3:05 PM 08/16/2019   10:30 AM  Fall Risk   Falls in the past year? 0 0 0 1 0  Number falls in past yr: 0 0 0 1 0  Injury with Fall? 0 0 0 1 0  Comment    broken collar bone   Risk for fall due to : No Fall Risks  No Fall Risks  No  Fall Risks  Follow up Falls prevention discussed Falls evaluation completed Falls evaluation completed  Falls evaluation completed;Education provided    FALL RISK PREVENTION PERTAINING TO THE HOME:  Any stairs in or around the home? Yes  If so, are there any without handrails? No  Home free of loose throw rugs in walkways, pet beds, electrical cords, etc? Yes  Adequate lighting in your home to reduce risk of falls? Yes   ASSISTIVE DEVICES UTILIZED TO PREVENT FALLS:  Life alert? No  Use of a cane, walker or w/c? No  Grab bars in the bathroom? Yes  Shower chair or bench in shower? Yes  Elevated toilet seat or a handicapped toilet? Yes   TIMED UP AND GO:  Was the test performed? No . Telephonic Visit  Cognitive Function:    08/16/2019   10:45 AM  MMSE - Mini Mental State Exam  Not completed: Refused      07/26/2019   10:00 AM  Montreal Cognitive Assessment   Visuospatial/ Executive (0/5) 5  Naming (0/3) 3  Attention: Read list of digits (0/2) 2  Attention: Read list of letters (0/1) 1  Attention: Serial 7 subtraction starting at 100 (0/3) 3  Language: Repeat phrase (0/2) 2  Language : Fluency (0/1) 1  Abstraction (0/2) 1  Delayed Recall (0/5) 5  Orientation (0/6) 6  Total 29      07/22/2022    3:09 PM 06/12/2021    9:06 AM  6CIT Screen  What Year? 0 points 0 points  What month? 0 points 0 points  What time? 0 points 0 points  Count back from 20 0 points 0 points  Months in reverse 0 points 0 points  Repeat phrase 0 points 0 points  Total Score 0 points 0 points    Immunizations Immunization History  Administered Date(s) Administered   Hepatitis A, Adult 01/27/2015   Influenza Split 04/01/2012   Influenza,inj,Quad PF,6+ Mos 12/08/2013, 12/20/2017, 12/23/2020   Janssen (J&J) SARS-COV-2 Vaccination 06/22/2019, 01/07/2020   Pneumococcal Conjugate-13 12/08/2013   Pneumococcal Polysaccharide-23 05/26/2017   Td 05/19/2005   Tdap 12/28/2015   Zoster Recombinat  (Shingrix) 09/08/2017, 12/24/2017, 01/12/2018   Zoster, Live 07/13/2014    TDAP status: Up to date  Flu Vaccine status: Due, Education has been provided regarding the importance of this vaccine. Advised may receive this vaccine at local pharmacy or Health Dept. Aware to provide a copy of the vaccination record if obtained from local pharmacy or Health Dept. Verbalized acceptance and understanding.  Pneumococcal vaccine status: Up to date  Covid-19 vaccine status: Completed vaccines  Qualifies for Shingles Vaccine? Yes   Zostavax completed Yes   Shingrix Completed?: Yes  Screening Tests Health Maintenance  Topic Date Due   Colonoscopy  Never done   COVID-19 Vaccine (3 - 2023-24 season) 10/19/2021   INFLUENZA VACCINE  09/19/2022   Medicare Annual Wellness (AWV)  07/22/2023   MAMMOGRAM  01/12/2024   DTaP/Tdap/Td (3 - Td or Tdap) 12/27/2025   Pneumonia Vaccine 79+ Years old  Completed  DEXA SCAN  Completed   Hepatitis C Screening  Completed   Zoster Vaccines- Shingrix  Completed   HPV VACCINES  Aged Out    Health Maintenance  Health Maintenance Due  Topic Date Due   Colonoscopy  Never done   COVID-19 Vaccine (3 - 2023-24 season) 10/19/2021    Colorectal cancer screening: No record  Mammogram status: Completed 01/11/2022. Repeat every year  Bone Density status: Completed 06/03/2017. Results reflect: Bone density results: NORMAL. Repeat every 5 years.  Lung Cancer Screening: (Low Dose CT Chest recommended if Age 7-80 years, 30 pack-year currently smoking OR have quit w/in 15years.) does not qualify.   Lung Cancer Screening Referral: no  Additional Screening:  Hepatitis C Screening: does qualify; Completed 01/27/2015  Vision Screening: Recommended annual ophthalmology exams for early detection of glaucoma and other disorders of the eye. Is the patient up to date with their annual eye exam?  Yes  Who is the provider or what is the name of the office in which the  patient attends annual eye exams? [facility] If pt is not established with a provider, would they like to be referred to a provider to establish care? No .   Dental Screening: Recommended annual dental exams for proper oral hygiene  Community Resource Referral / Chronic Care Management: CRR required this visit?  No   CCM required this visit?  No      Plan:     I have personally reviewed and noted the following in the patient's chart:   Medical and social history Use of alcohol, tobacco or illicit drugs  Current medications and supplements including opioid prescriptions. Patient is not currently taking opioid prescriptions. Functional ability and status Nutritional status Physical activity Advanced directives List of other physicians Hospitalizations, surgeries, and ER visits in previous 12 months Vitals Screenings to include cognitive, depression, and falls Referrals and appointments  In addition, I have reviewed and discussed with patient certain preventive protocols, quality metrics, and best practice recommendations. A written personalized care plan for preventive services as well as general preventive health recommendations were provided to patient.     Mickeal Needy, LPN   02/23/1094   Nurse Notes: Normal cognitive status assessed by direct observation via telephone conversation by this Nurse Health Advisor. No abnormalities found.

## 2022-07-23 ENCOUNTER — Ambulatory Visit
Admission: RE | Admit: 2022-07-23 | Discharge: 2022-07-23 | Disposition: A | Discharge status: Home / Self Care | Payer: Medicare PPO | Source: Ambulatory Visit | Attending: Internal Medicine | Admitting: Internal Medicine

## 2022-07-23 DIAGNOSIS — N83209 Unspecified ovarian cyst, unspecified side: Secondary | ICD-10-CM

## 2022-07-23 DIAGNOSIS — N83291 Other ovarian cyst, right side: Secondary | ICD-10-CM | POA: Diagnosis not present

## 2022-07-25 ENCOUNTER — Other Ambulatory Visit: Payer: Self-pay | Admitting: Internal Medicine

## 2022-07-25 DIAGNOSIS — E890 Postprocedural hypothyroidism: Secondary | ICD-10-CM

## 2022-07-26 ENCOUNTER — Telehealth: Payer: Self-pay | Admitting: Internal Medicine

## 2022-07-26 ENCOUNTER — Other Ambulatory Visit: Payer: Self-pay | Admitting: Internal Medicine

## 2022-07-26 DIAGNOSIS — E890 Postprocedural hypothyroidism: Secondary | ICD-10-CM

## 2022-07-26 MED ORDER — LEVOTHYROXINE SODIUM 100 MCG PO TABS: 100.0000 ug | ORAL_TABLET | Freq: Every day | ORAL | 0 refills | Status: DC

## 2022-07-26 NOTE — Telephone Encounter (Signed)
Prescription Request  07/26/2022  LOV: 01/24/2022  What is the name of the medication or equipment?  levothyroxine (SYNTHROID) 100 MCG tablet    Have you contacted your pharmacy to request a refill? No   Which pharmacy would you like this sent to?     [facility] [address] ([address] [place] - [address] [place]) [place] [address] Phone: [phone] Fax: [phone]  Patient notified that their request is being sent to the clinical staff for review and that they should receive a response within 2 business days.   Please advise at Mobile [phone] (mobile)   Pt has upcoming appt on Monday and she is completely she want to see someone prescribe a temporary Rx until appt.

## 2022-07-26 NOTE — Telephone Encounter (Signed)
Patient is requesting this be sent in.  She said her last visit she rescheduled because we needed her pelvic ultrasound done first.  Please advise.

## 2022-07-29 ENCOUNTER — Ambulatory Visit (INDEPENDENT_AMBULATORY_CARE_PROVIDER_SITE_OTHER): Payer: Medicare PPO | Admitting: Internal Medicine

## 2022-07-29 ENCOUNTER — Encounter: Payer: Self-pay | Admitting: Internal Medicine

## 2022-07-29 VITALS — BP 126/84 | HR 61 | Temp 98.4°F | Ht 71.0 in | Wt 187.0 lb

## 2022-07-29 DIAGNOSIS — R1319 Other dysphagia: Secondary | ICD-10-CM

## 2022-07-29 DIAGNOSIS — M170 Bilateral primary osteoarthritis of knee: Secondary | ICD-10-CM

## 2022-07-29 DIAGNOSIS — I10 Essential (primary) hypertension: Secondary | ICD-10-CM | POA: Diagnosis not present

## 2022-07-29 DIAGNOSIS — F4322 Adjustment disorder with anxiety: Secondary | ICD-10-CM | POA: Diagnosis not present

## 2022-07-29 DIAGNOSIS — C73 Malignant neoplasm of thyroid gland: Secondary | ICD-10-CM | POA: Diagnosis not present

## 2022-07-29 DIAGNOSIS — J410 Simple chronic bronchitis: Secondary | ICD-10-CM

## 2022-07-29 DIAGNOSIS — E7800 Pure hypercholesterolemia, unspecified: Secondary | ICD-10-CM | POA: Diagnosis not present

## 2022-07-29 DIAGNOSIS — Z0000 Encounter for general adult medical examination without abnormal findings: Secondary | ICD-10-CM

## 2022-07-29 DIAGNOSIS — F428 Other obsessive-compulsive disorder: Secondary | ICD-10-CM

## 2022-07-29 DIAGNOSIS — J069 Acute upper respiratory infection, unspecified: Secondary | ICD-10-CM

## 2022-07-29 DIAGNOSIS — E890 Postprocedural hypothyroidism: Secondary | ICD-10-CM

## 2022-07-29 DIAGNOSIS — F4323 Adjustment disorder with mixed anxiety and depressed mood: Secondary | ICD-10-CM

## 2022-07-29 DIAGNOSIS — F411 Generalized anxiety disorder: Secondary | ICD-10-CM

## 2022-07-29 LAB — COMPREHENSIVE METABOLIC PANEL
ALT: 46 U/L — ABNORMAL HIGH (ref 0–35)
AST: 51 U/L — ABNORMAL HIGH (ref 0–37)
Albumin: 3.9 g/dL (ref 3.5–5.2)
Alkaline Phosphatase: 36 U/L — ABNORMAL LOW (ref 39–117)
BUN: 10 mg/dL (ref 6–23)
CO2: 33 mEq/L — ABNORMAL HIGH (ref 19–32)
Calcium: 9.1 mg/dL (ref 8.4–10.5)
Chloride: 100 mEq/L (ref 96–112)
Creatinine, Ser: 0.77 mg/dL (ref 0.40–1.20)
GFR: 77.95 mL/min (ref >60.00)
Glucose, Bld: 86 mg/dL (ref 70–99)
Potassium: 3.6 mEq/L (ref 3.5–5.1)
Sodium: 140 mEq/L (ref 135–145)
Total Bilirubin: 0.4 mg/dL (ref 0.2–1.2)
Total Protein: 6.4 g/dL (ref 6.0–8.3)

## 2022-07-29 LAB — CBC
HCT: 42.4 % (ref 36.0–46.0)
Hemoglobin: 14 g/dL (ref 12.0–15.0)
MCHC: 33 g/dL (ref 30.0–36.0)
MCV: 90 fl (ref 78.0–100.0)
Platelets: 365 10*3/uL (ref 150.0–400.0)
RBC: 4.71 Mil/uL (ref 3.87–5.11)
RDW: 13.2 % (ref 11.5–15.5)
WBC: 6.9 10*3/uL (ref 4.0–10.5)

## 2022-07-29 LAB — LIPID PANEL
Cholesterol: 204 mg/dL — ABNORMAL HIGH (ref 0–200)
HDL: 44.5 mg/dL (ref >39.00)
NonHDL: 159.53
Total CHOL/HDL Ratio: 5
Triglycerides: 239 mg/dL — ABNORMAL HIGH (ref 0.0–149.0)
VLDL: 47.8 mg/dL — ABNORMAL HIGH (ref 0.0–40.0)

## 2022-07-29 LAB — T4, FREE: Free T4: 0.52 ng/dL — ABNORMAL LOW (ref 0.60–1.60)

## 2022-07-29 LAB — LDL CHOLESTEROL, DIRECT: Direct LDL: 133 mg/dL

## 2022-07-29 LAB — TSH: TSH: 20.25 u[IU]/mL — ABNORMAL HIGH (ref 0.35–5.50)

## 2022-07-29 MED ORDER — LORAZEPAM 0.5 MG PO TABS: 0.5000 mg | ORAL_TABLET | Freq: Every day | ORAL | 0 refills | Status: DC | PRN

## 2022-07-29 MED ORDER — ALBUTEROL SULFATE HFA 108 (90 BASE) MCG/ACT IN AERS: 2.0000 | INHALATION_SPRAY | Freq: Four times a day (QID) | RESPIRATORY_TRACT | 0 refills | Status: DC | PRN

## 2022-07-29 MED ORDER — BUPROPION HCL ER (XL) 150 MG PO TB24: 150.0000 mg | ORAL_TABLET | Freq: Every day | ORAL | 3 refills | Status: DC

## 2022-07-29 MED ORDER — LEVOTHYROXINE SODIUM 100 MCG PO TABS: 100.0000 ug | ORAL_TABLET | Freq: Every day | ORAL | 3 refills | Status: DC

## 2022-07-29 MED ORDER — ATENOLOL-CHLORTHALIDONE 50-25 MG PO TABS: 0.5000 | ORAL_TABLET | Freq: Every day | ORAL | 3 refills | Status: DC

## 2022-07-29 MED ORDER — FLUTICASONE PROPIONATE 50 MCG/ACT NA SUSP: NASAL | 3 refills | Status: DC

## 2022-07-29 MED ORDER — DULOXETINE HCL 60 MG PO CPEP: 60.0000 mg | ORAL_CAPSULE | Freq: Two times a day (BID) | ORAL | 3 refills | Status: DC

## 2022-07-29 NOTE — Patient Instructions (Signed)
We will check the labs and the ultrasound of the thyroid.

## 2022-07-29 NOTE — Progress Notes (Unsigned)
   Subjective:   Patient ID: Theresa Perez, female    DOB: 14-Oct-1950, 72 y.o.   MRN: [id number]  HPI The patient is here for physical. Also with acute issues see A/P  PMH, [facility], social history reviewed and updated  Review of Systems  Constitutional: Negative.   HENT:  Positive for trouble swallowing.   Eyes: Negative.   Respiratory:  Negative for cough, chest tightness and shortness of breath.   Cardiovascular:  Negative for chest pain, palpitations and leg swelling.  Gastrointestinal:  Negative for abdominal distention, abdominal pain, constipation, diarrhea, nausea and vomiting.  Musculoskeletal:  Positive for arthralgias.  Skin: Negative.   Neurological: Negative.   Psychiatric/Behavioral: Negative.      Objective:  Physical Exam Constitutional:      Appearance: She is well-developed.  HENT:     Head: Normocephalic and atraumatic.  Cardiovascular:     Rate and Rhythm: Normal rate and regular rhythm.  Pulmonary:     Effort: Pulmonary effort is normal. No respiratory distress.     Breath sounds: Normal breath sounds. No wheezing or rales.  Abdominal:     General: Bowel sounds are normal. There is no distension.     Palpations: Abdomen is soft.     Tenderness: There is no abdominal tenderness. There is no rebound.  Musculoskeletal:        General: Tenderness present.     Cervical back: Normal range of motion.  Skin:    General: Skin is warm and dry.  Neurological:     Mental Status: She is alert and oriented to person, place, and time.     Coordination: Coordination normal.     Vitals:   07/29/22 1534  BP: 126/84  Pulse: 61  Temp: 98.4 F (36.9 C)  TempSrc: Oral  SpO2: 98%  Weight: 187 lb (84.8 kg)  Height: 5\' 11"  (1.803 m)    Assessment & Plan:

## 2022-08-01 NOTE — Assessment & Plan Note (Signed)
Overall stable and using otc meds for pain. Is working on exercise to help.

## 2022-08-01 NOTE — Assessment & Plan Note (Signed)
Flu shot yearly. Pneumonia complete. Shingrix complete. Tetanus up to date. Colonoscopy up to date. Mammogram up to date, pap smear aged out and dexa complete. Counseled about sun safety and mole surveillance. Counseled about the dangers of distracted driving. Given 10 year screening recommendations.   

## 2022-08-01 NOTE — Assessment & Plan Note (Signed)
Checking lipid panel and adjust as needed not on meds currently.  

## 2022-08-01 NOTE — Assessment & Plan Note (Signed)
Overall stable on cymbalta 60 mg BID and wellbutrin 150 mg daily and lorazepam 0.5 mg daily prn for anxiety or sleep. Will continue all.

## 2022-08-01 NOTE — Assessment & Plan Note (Addendum)
Having some new swallowing problems since last visit. Checking TSH and free T4 and US thyroid to assess for any new growth or change. Adjust synthroid 100 mcg daily as needed.

## 2022-08-01 NOTE — Assessment & Plan Note (Signed)
Still smoking and not able to make quit attempt at this time. Counseled about risk of smoking.

## 2022-08-01 NOTE — Assessment & Plan Note (Signed)
BP at goal on atenolol/chlorthalidone 50/25 1/2 pill daily and checking CMP and adjust as needed.

## 2022-08-01 NOTE — Assessment & Plan Note (Signed)
Past hx of thyroid cancer so checking TSH and free T4 and US thyroid to assess. If normal will refer to GI for EGD for stricture assessment. Sounds to be obstructive in the esophagus.

## 2022-08-02 ENCOUNTER — Other Ambulatory Visit: Payer: Self-pay | Admitting: Internal Medicine

## 2022-08-02 DIAGNOSIS — C73 Malignant neoplasm of thyroid gland: Secondary | ICD-10-CM

## 2022-08-02 DIAGNOSIS — R7989 Other specified abnormal findings of blood chemistry: Secondary | ICD-10-CM

## 2022-08-02 MED ORDER — LEVOTHYROXINE SODIUM 112 MCG PO TABS: 112.0000 ug | ORAL_TABLET | Freq: Every day | ORAL | 3 refills | Status: DC

## 2022-08-20 ENCOUNTER — Ambulatory Visit
Admission: RE | Admit: 2022-08-20 | Discharge: 2022-08-20 | Disposition: A | Discharge status: Home / Self Care | Payer: Medicare PPO | Source: Ambulatory Visit | Attending: Internal Medicine | Admitting: Internal Medicine

## 2022-08-20 DIAGNOSIS — R1310 Dysphagia, unspecified: Secondary | ICD-10-CM | POA: Diagnosis not present

## 2022-08-20 DIAGNOSIS — E890 Postprocedural hypothyroidism: Secondary | ICD-10-CM

## 2022-08-20 DIAGNOSIS — Z85850 Personal history of malignant neoplasm of thyroid: Secondary | ICD-10-CM | POA: Diagnosis not present

## 2022-08-23 ENCOUNTER — Encounter: Payer: Self-pay | Admitting: Internal Medicine

## 2022-08-28 ENCOUNTER — Telehealth: Payer: Self-pay | Admitting: Urgent Care

## 2022-08-28 DIAGNOSIS — R1310 Dysphagia, unspecified: Secondary | ICD-10-CM

## 2022-08-28 NOTE — Telephone Encounter (Signed)
Pt needs referral placed to GI for dysphagia after having had a normal thyroid ultrasound.

## 2022-09-13 ENCOUNTER — Ambulatory Visit: Payer: Medicare PPO | Admitting: Physician Assistant

## 2022-09-13 ENCOUNTER — Encounter: Payer: Self-pay | Admitting: Physician Assistant

## 2022-09-13 VITALS — BP 80/60 | HR 76 | Ht 69.75 in | Wt 185.2 lb

## 2022-09-13 DIAGNOSIS — R1310 Dysphagia, unspecified: Secondary | ICD-10-CM

## 2022-09-13 DIAGNOSIS — Z1211 Encounter for screening for malignant neoplasm of colon: Secondary | ICD-10-CM | POA: Diagnosis not present

## 2022-09-13 DIAGNOSIS — K219 Gastro-esophageal reflux disease without esophagitis: Secondary | ICD-10-CM

## 2022-09-13 MED ORDER — OMEPRAZOLE 20 MG PO CPDR: 20.0000 mg | DELAYED_RELEASE_CAPSULE | Freq: Every day | ORAL | 2 refills | Status: DC

## 2022-09-13 NOTE — Patient Instructions (Signed)
We have sent the following medications to your pharmacy for you to pick up at your convenience:  Omeprazole  Your provider has ordered Cologuard testing as an option for colon cancer screening. This is performed by [employer] [facility] and may be out of network with your insurance. PRIOR to completing the test, it is YOUR responsibility to contact your insurance about covered benefits for this test. Your out of pocket expense could be anywhere from $0.00 to $649.00.   When you call to check coverage with your insurer, please provide the following information:   -The ONLY provider of Cologuard is [facility], dispensing  - CPT code for Cologuard is [phone].  [facility] NPI # [id number]  -[employer] Tax ID # [id number]   We have already sent your demographic and insurance information to [employer] (phone number [phone]) and they should contact you within the next week regarding your test. If you have not heard from them within the next week, please call our office at [phone].  You have been scheduled for a Barium Esophogram at [facility](Entrance A) on 09/20/2022 at 9:00am. Please arrive 30 minutes prior to your appointment for registration. Make certain not to have anything to eat or drink 3 hours prior to your test. If you need to reschedule for any reason, please contact radiology at [phone] to do so. _________________________________________________________ A barium swallow is an examination that concentrates on views of the esophagus. This tends to be a double contrast exam (barium and two liquids which, when combined, create a gas to distend the wall of the oesophagus) or single contrast (non-ionic iodine based). The study is usually tailored to your symptoms so a good history is essential. Attention is paid during the study to the form, structure and configuration of the esophagus, looking for functional disorders (such as aspiration, dysphagia,  achalasia, motility and reflux) EXAMINATION You may be asked to change into a gown, depending on the type of swallow being performed. A radiologist and radiographer will perform the procedure. The radiologist will advise you of the type of contrast selected for your procedure and direct you during the exam. You will be asked to stand, sit or lie in several different positions and to hold a small amount of fluid in your mouth before being asked to swallow while the imaging is performed .In some instances you may be asked to swallow barium coated marshmallows to assess the motility of a solid food bolus. The exam can be recorded as a digital or video fluoroscopy procedure. POST PROCEDURE It will take 1-2 days for the barium to pass through your system. To facilitate this, it is important, unless otherwise directed, to increase your fluids for the next 24-48hrs and to resume your normal diet.  This test typically takes about 30 minutes to perform. __________________________________________________________________________________

## 2022-09-13 NOTE — Progress Notes (Signed)
Chief Complaint: Dysphagia  HPI:    Theresa Perez is a 72 year old Caucasian female with a past medical history as listed below including anxiety, thyroid cancer and multiple others, known to Dr. Russella Dar, who was referred to me by Myrlene Broker, * for a complaint of dysphagia.    01/17/2011 patient seen in clinic by Dr. Russella Dar for right periumbilical pain.  At that time discussed mild constipation.  Recommended a colonoscopy.    07/29/2022 office visit with PCP and at that time discussed some new issues with swallowing.  She was referred to [place] for stricture assessment.    08/20/2022 thyroid ultrasound done for new dysphagia with no evidence of recurrent or residual thyroid malignancy.    Today, patient presents to clinic and tells me that she has had some issues with swallowing over the past 6 years, but over the past 6 months or so she has had at least 2 episodes where she was "afraid for my life", telling me that when she was eating it seemed like she could not breathe at all, eventually the food seemed to pass down her esophagus and was chicken both times but she is very worried now.  Does describe occasional heartburn off-and-on, worse when she sleeping at night so she sleeps with the head of the bed elevated, takes Tums occasionally but has never been on a PPI.    Denies prior colon cancer screening.  She is nervous about having a colonoscopy and perforation.    Denies fever, chills, weight loss, nausea, vomiting or symptoms that awaken her from sleep.  Past Medical History:  Diagnosis Date   Allergy    Anxiety    Cancer (HCC) 1997   Thyroid-Papillary   Complication of anesthesia    vomitting only   Cyst of breast, right, diffuse fibrocystic    Hypertension    Hypothyroidism    PONV (postoperative nausea and vomiting)     Past Surgical History:  Procedure Laterality Date   ANTERIOR AND POSTERIOR REPAIR N/A 03/31/2012   Procedure: ANTERIOR (CYSTOCELE) AND POSTERIOR REPAIR  (RECTOCELE);  Surgeon: Miguel Aschoff, MD;  Location: [facility];  Service: Gynecology;  Laterality: N/A;   aspiration cyst right breast     BREAST BIOPSY Left 01/09/2022   MM LT BREAST BX W LOC DEV 1ST LESION IMAGE BX SPEC STEREO GUIDE 01/09/2022 [facility]   THYROIDECTOMY  1997   TONSILLECTOMY  1969   uterine ablation     VAGINAL HYSTERECTOMY N/A 03/31/2012   Procedure: HYSTERECTOMY VAGINAL;  Surgeon: Miguel Aschoff, MD;  Location: [facility];  Service: Gynecology;  Laterality: N/A;    Current Outpatient Medications  Medication Sig Dispense Refill   albuterol (VENTOLIN HFA) 108 (90 Base) MCG/ACT inhaler Inhale 2 puffs into the lungs every 6 (six) hours as needed for wheezing or shortness of breath. 8 g 0   Ascorbic Acid (VITAMIN C PO) Take 1 tablet by mouth daily.     atenolol-chlorthalidone (TENORETIC) 50-25 MG tablet Take 0.5 tablets by mouth daily. 45 tablet 3   Black Cohosh 540 MG CAPS Take 540 mg by mouth 2 (two) times daily.      buPROPion (WELLBUTRIN XL) 150 MG 24 hr tablet Take 1 tablet (150 mg total) by mouth daily. 90 tablet 3   butalbital-acetaminophen-caffeine (ESGIC) 50-325-40 MG tablet Take 1 tablet by mouth 2 (two) times daily as needed for headache. 30 tablet 0   CALCIUM PO Take 1 tablet by mouth daily.     COENZYME Q-10  PO Take 1 tablet by mouth daily.     Cyanocobalamin (VITAMIN B-12 PO) Take 1 tablet by mouth daily.     DULoxetine (CYMBALTA) 60 MG capsule Take 1 capsule (60 mg total) by mouth 2 (two) times daily. 180 capsule 3   ECHINACEA EXTRACT PO Take by mouth.     EVENING PRIMROSE OIL PO Take by mouth.     ferrous sulfate 325 (65 FE) MG tablet Take 325 mg by mouth every evening.      fluticasone (FLONASE) 50 MCG/ACT nasal spray Use 2 spray in each nostril once daily 48 g 3   Ginkgo Biloba (GNP GINGKO BILOBA EXTRACT PO) Take 1 tablet by mouth daily.     levothyroxine (SYNTHROID) 112 MCG tablet Take 1 tablet (112 mcg total) by mouth daily. 90 tablet 3   LORazepam (ATIVAN)  0.5 MG tablet Take 1 tablet (0.5 mg total) by mouth daily as needed for anxiety. 20 tablet 0   Misc Natural Products (GLUCOSAMINE CHOND COMPLEX/MSM) TABS Take 1 tablet by mouth 2 (two) times daily.     Multiple Vitamins-Minerals (MULTIVITAMIN WITH MINERALS) tablet Take 1 tablet by mouth daily.     Nutritional Supplements (DHEA PO) Take 1 tablet by mouth daily.     triamcinolone cream (KENALOG) 0.1 % Apply 1 Application topically 2 (two) times daily. 30 g 0   Turmeric (QC TUMERIC COMPLEX PO) Take by mouth.     No current facility-administered medications for this visit.    Allergies as of 09/13/2022 - Review Complete 07/29/2022  Allergen Reaction Noted   Permethrin Rash 08/12/2015    Family History  Problem Relation Age of Onset   Diabetes Mother    Deep vein thrombosis Mother        Varicose Veins   Cancer Father        Father died from brain tumor.   Heart disease Father    Colon cancer Neg Hx    Stomach cancer Neg Hx    Esophageal cancer Neg Hx    Rectal cancer Neg Hx     Social History   Socioeconomic History   Marital status: Married    Spouse name: Not on file   Number of children: 1   Years of education: 14   Highest education level: Associate degree: academic program  Occupational History   Not on file  Tobacco Use   Smoking status: Every Day    Current packs/day: 0.50    Average packs/day: 0.5 packs/day for 15.0 years (7.5 ttl pk-yrs)    Types: Cigarettes    Passive exposure: Past   Smokeless tobacco: Never   Tobacco comments:    Occassional smoker  Vaping Use   Vaping status: Never Used  Substance and Sexual Activity   Alcohol use: Yes    Comment: occ beer   Drug use: No   Sexual activity: Yes    Birth control/protection: Post-menopausal  Other Topics Concern   Not on file  Social History Narrative   Left handed   Drinks caffeine   Two story home   Social Determinants of Health   Financial Resource Strain: Low Risk  (07/22/2022)   Overall  Financial Resource Strain (CARDIA)    Difficulty of Paying Living Expenses: Not hard at all  Food Insecurity: No Food Insecurity (07/22/2022)   Hunger Vital Sign    Worried About Running Out of Food in the Last Year: Never true    Ran Out of Food in the Last Year: Never true  Transportation Needs: No Transportation Needs (07/22/2022)   PRAPARE - Administrator, Civil Service (Medical): No    Lack of Transportation (Non-Medical): No  Physical Activity: Sufficiently Active (07/22/2022)   Exercise Vital Sign    Days of Exercise per Week: 5 days    Minutes of Exercise per Session: 60 min  Stress: No Stress Concern Present (07/22/2022)   [employer] of Occupational Health - Occupational Stress Questionnaire    Feeling of Stress : Not at all  Social Connections: Socially Integrated (07/22/2022)   Social Connection and Isolation Panel [NHANES]    Frequency of Communication with Friends and Family: More than three times a week    Frequency of Social Gatherings with Friends and Family: More than three times a week    Attends Religious Services: More than 4 times per year    Active Member of [employer] or Organizations: Yes    Attends Engineer, structural: More than 4 times per year    Marital Status: Married  Catering manager Violence: Not At Risk (07/22/2022)   Humiliation, Afraid, Rape, and Kick questionnaire    Fear of Current or Ex-Partner: No    Emotionally Abused: No    Physically Abused: No    Sexually Abused: No    Review of Systems:    Constitutional: No weight loss, fever or chills Cardiovascular: No chest pain Respiratory: No SOB  Gastrointestinal: See HPI and otherwise negative Genitourinary: No dysuria Neurological: No headache, dizziness or syncope Musculoskeletal: No new muscle or joint pain Hematologic: No bleeding  Psychiatric: No history of depression or anxiety   Physical Exam:  Vital signs: BP (!) 80/60 (BP Location: Left Arm, Patient Position:  Sitting, Cuff Size: Large)   Pulse 76   Ht 5' 9.75" (1.772 m) Comment: height measured without shoes  Wt 185 lb 4 oz (84 kg)   BMI 26.77 kg/m    Constitutional:   Pleasant elderly Caucasian female appears to be in NAD, Well developed, Well nourished, alert and cooperative Head:  Normocephalic and atraumatic. Eyes:   PEERL, EOMI. No icterus. Conjunctiva pink. Ears:  Normal auditory acuity. Neck:  Supple Throat: Oral cavity and pharynx without inflammation, swelling or lesion.  Respiratory: Respirations even and unlabored. Lungs clear to auscultation bilaterally.   No wheezes, crackles, or rhonchi.  Cardiovascular: Normal S1, S2. No MRG. Regular rate and rhythm. No peripheral edema, cyanosis or pallor.  Gastrointestinal:  Soft, nondistended, nontender. No rebound or guarding. Normal bowel sounds. No appreciable masses or hepatomegaly. Rectal:  Not performed.  Msk:  Symmetrical without gross deformities. Without edema, no deformity or joint abnormality.  Neurologic:  Alert and  oriented x4;  grossly normal neurologically.  Skin:   Dry and intact without significant lesions or rashes. Psychiatric: Demonstrates good judgement and reason without abnormal affect or behaviors.  RELEVANT LABS AND IMAGING: CBC    Component Value Date/Time   WBC 6.9 07/29/2022 1605   RBC 4.71 07/29/2022 1605   HGB 14.0 07/29/2022 1605   HCT 42.4 07/29/2022 1605   PLT 365.0 07/29/2022 1605   MCV 90.0 07/29/2022 1605   MCH 30.1 11/17/2013 1447   MCHC 33.0 07/29/2022 1605   RDW 13.2 07/29/2022 1605   LYMPHSABS 2.8 02/27/2022 1516   MONOABS 1.0 02/27/2022 1516   EOSABS 0.3 02/27/2022 1516   BASOSABS 0.1 02/27/2022 1516    CMP     Component Value Date/Time   NA 140 07/29/2022 1605   K 3.6 07/29/2022 1605  CL 100 07/29/2022 1605   CO2 33 (H) 07/29/2022 1605   GLUCOSE 86 07/29/2022 1605   BUN 10 07/29/2022 1605   CREATININE 0.77 07/29/2022 1605   CALCIUM 9.1 07/29/2022 1605   PROT 6.4 07/29/2022  1605   ALBUMIN 3.9 07/29/2022 1605   AST 51 (H) 07/29/2022 1605   ALT 46 (H) 07/29/2022 1605   ALKPHOS 36 (L) 07/29/2022 1605   BILITOT 0.4 07/29/2022 1605   GFRNONAA >90 11/17/2013 1447   GFRAA >90 11/17/2013 1447    Assessment: 1.  Dysphagia: For the past 6 years off-and-on, worse over the past few months, worse to meats, does describe occasional reflux symptoms also prior thyroid cancer surgery and clavicle injury; consider esophageal stricture versus extrinsic compression versus other 2.  Screening for colorectal cancer: Patient agrees to Cologuard today 3.  GERD: Off-and-on  Plan: 1.  Started the patient on Omeprazole 20 mg daily, 30-60 minutes before breakfast.  #30 with 5 refills 2.  Scheduled patient for a barium esophagram with tablet.  Pending results can consider EGD. 3.  Ordered Cologuard for the patient today. 4.  Patient to follow in clinic per recommendations after above.  Hyacinth Meeker, PA-C [facility] 09/13/2022, 1:12 PM  Cc: Myrlene Broker, *

## 2022-09-20 ENCOUNTER — Ambulatory Visit (HOSPITAL_COMMUNITY)
Admission: RE | Admit: 2022-09-20 | Discharge: 2022-09-20 | Disposition: A | Discharge status: Home / Self Care | Payer: Medicare PPO | Source: Ambulatory Visit | Attending: Physician Assistant | Admitting: Physician Assistant

## 2022-09-20 DIAGNOSIS — Z1211 Encounter for screening for malignant neoplasm of colon: Secondary | ICD-10-CM | POA: Insufficient documentation

## 2022-09-20 DIAGNOSIS — R1310 Dysphagia, unspecified: Secondary | ICD-10-CM | POA: Diagnosis not present

## 2022-09-20 DIAGNOSIS — K219 Gastro-esophageal reflux disease without esophagitis: Secondary | ICD-10-CM | POA: Diagnosis not present

## 2022-09-23 ENCOUNTER — Encounter: Payer: Self-pay | Admitting: *Deleted

## 2022-09-23 ENCOUNTER — Other Ambulatory Visit: Payer: Self-pay | Admitting: *Deleted

## 2022-09-23 DIAGNOSIS — R1319 Other dysphagia: Secondary | ICD-10-CM

## 2022-10-06 ENCOUNTER — Other Ambulatory Visit: Payer: Self-pay

## 2022-10-06 ENCOUNTER — Ambulatory Visit
Admission: RE | Admit: 2022-10-06 | Discharge: 2022-10-06 | Disposition: A | Discharge status: Home / Self Care | Payer: Medicare PPO | Source: Ambulatory Visit | Attending: Physician Assistant | Admitting: Physician Assistant

## 2022-10-06 ENCOUNTER — Ambulatory Visit: Payer: Medicare PPO

## 2022-10-06 VITALS — BP 161/89 | HR 77 | Temp 99.8°F | Resp 18

## 2022-10-06 DIAGNOSIS — R918 Other nonspecific abnormal finding of lung field: Secondary | ICD-10-CM | POA: Diagnosis not present

## 2022-10-06 DIAGNOSIS — R059 Cough, unspecified: Secondary | ICD-10-CM | POA: Diagnosis not present

## 2022-10-06 DIAGNOSIS — J189 Pneumonia, unspecified organism: Secondary | ICD-10-CM | POA: Diagnosis not present

## 2022-10-06 DIAGNOSIS — R509 Fever, unspecified: Secondary | ICD-10-CM | POA: Diagnosis not present

## 2022-10-06 MED ORDER — AMOXICILLIN-POT CLAVULANATE 875-125 MG PO TABS: 1.0000 | ORAL_TABLET | Freq: Two times a day (BID) | ORAL | 0 refills | Status: AC

## 2022-10-06 MED ORDER — BENZONATATE 100 MG PO CAPS: 100.0000 mg | ORAL_CAPSULE | Freq: Three times a day (TID) | ORAL | 0 refills | Status: DC

## 2022-10-06 MED ORDER — ALBUTEROL SULFATE HFA 108 (90 BASE) MCG/ACT IN AERS: 2.0000 | INHALATION_SPRAY | Freq: Four times a day (QID) | RESPIRATORY_TRACT | 0 refills | Status: DC | PRN

## 2022-10-06 MED ORDER — AZITHROMYCIN 250 MG PO TABS: ORAL_TABLET | ORAL | 0 refills | Status: DC

## 2022-10-06 NOTE — ED Provider Notes (Signed)
[facility]    CSN: [id number] Arrival date & time: 10/06/22  1255      History   Chief Complaint Chief Complaint  Patient presents with   Cough    Have been coughing up green phlegm for the past several days. - Entered by patient    HPI XINYU BASHLINE is a 72 y.o. female.   Pt reports about two weeks ago started with cough and congestion.  She reports now she is now experiencing productive cough with dark green discharge.  She reports fatigue/lack of energy and wheezing.  She denies h/o asthma or lung disease.  She reports she is very active, reports occasional smoking.  She is currently taking otc flu and cold medications with minimal relief.     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   Complication of anesthesia    vomitting only   Cyst of breast, right, diffuse fibrocystic    Depression    Hypertension    Hypothyroidism    PONV (postoperative nausea and vomiting)     Patient Active Problem List   Diagnosis Date Noted   Toe pain, right 01/25/2022   Bilateral primary osteoarthritis of knee 06/14/2021   Adjustment disorder with mixed anxiety and depressed mood 08/02/2019   Memory change 06/25/2019   Smokers' cough (HCC) 12/11/2017   De Quervain's disease (radial styloid tenosynovitis) 04/01/2017   Dysphagia 12/06/2016   Routine general medical examination at a health care facility 12/16/2014   Constipation 07/13/2014   Papillary thyroid carcinoma (HCC) 03/23/2014   Pure hypercholesterolemia 12/08/2013   Varicose veins of lower extremities with other complications 12/09/2011   Essential hypertension 05/19/2007   Allergic rhinitis 05/19/2007    Past Surgical History:  Procedure Laterality Date   ANTERIOR AND POSTERIOR REPAIR N/A 03/31/2012   Procedure: ANTERIOR (CYSTOCELE) AND POSTERIOR REPAIR (RECTOCELE);  Surgeon: Miguel Aschoff, MD;  Location: [facility];  Service:  Gynecology;  Laterality: N/A;   aspiration cyst right breast     BREAST BIOPSY Left 01/09/2022   MM LT BREAST BX W LOC DEV 1ST LESION IMAGE BX SPEC STEREO GUIDE 01/09/2022 [facility]   THYROIDECTOMY  02/19/1995   with parathyroidectomy   TONSILLECTOMY  02/19/1967   uterine ablation     VAGINAL HYSTERECTOMY N/A 03/31/2012   Procedure: HYSTERECTOMY VAGINAL;  Surgeon: Miguel Aschoff, MD;  Location: [facility];  Service: Gynecology;  Laterality: N/A;    OB History   No obstetric history on file.      Home Medications    Prior to Admission medications   Medication Sig Start Date End Date Taking? Authorizing Provider  amoxicillin-clavulanate (AUGMENTIN) 875-125 MG tablet Take 1 tablet by mouth 2 (two) times daily for 5 days. 10/06/22 10/11/22 Yes Ward, Tylene Fantasia, PA-C  azithromycin (ZITHROMAX Z-PAK) 250 MG tablet Take 2 tablets by mouth today and then 1 tablet by mouth the following days. 10/06/22  Yes Ward, Tylene Fantasia, PA-C  benzonatate (TESSALON) 100 MG capsule Take 1 capsule (100 mg total) by mouth every 8 (eight) hours. 10/06/22  Yes Ward, Tylene Fantasia, PA-C  albuterol (VENTOLIN HFA) 108 (90 Base) MCG/ACT inhaler Inhale 2 puffs into the lungs every 6 (six) hours as needed for wheezing or shortness of breath. 10/06/22   Ward, Tylene Fantasia, PA-C  Ascorbic Acid (VITAMIN C PO) Take 1 tablet by mouth daily.    [provider]  atenolol-chlorthalidone (TENORETIC) 50-25 MG tablet Take 0.5 tablets by mouth daily. 07/29/22   Myrlene Broker, MD  Black Cohosh 540 MG CAPS Take 540 mg by mouth 2 (two) times daily.     [provider]  buPROPion (WELLBUTRIN XL) 150 MG 24 hr tablet Take 1 tablet (150 mg total) by mouth daily. 07/29/22   Myrlene Broker, MD  butalbital-acetaminophen-caffeine (ESGIC) [phone] MG tablet Take 1 tablet by mouth 2 (two) times daily as needed for headache. 01/07/20   Olive Bass, FNP  CALCIUM PO Take 1 tablet by mouth daily.    [provider]  COENZYME Q-10 PO Take 1 tablet by mouth daily.    [provider]  Cyanocobalamin (VITAMIN B-12 PO) Take 1 tablet by mouth daily.    [provider]  DULoxetine (CYMBALTA) 60 MG capsule Take 1 capsule (60 mg total) by mouth 2 (two) times daily. 07/29/22   Myrlene Broker, MD  ECHINACEA EXTRACT PO Take by mouth.    [provider]  EVENING PRIMROSE OIL PO Take by mouth.    [provider]  ferrous sulfate 325 (65 FE) MG tablet Take 325 mg by mouth every evening.     [provider]  fluticasone Aleda Grana) 50 MCG/ACT nasal spray Use 2 spray in each nostril once daily 07/29/22   Myrlene Broker, MD  Ginkgo Biloba (GNP GINGKO BILOBA EXTRACT PO) Take 1 tablet by mouth daily.    [provider]  levothyroxine (SYNTHROID) 112 MCG tablet Take 1 tablet (112 mcg total) by mouth daily. 08/02/22   Myrlene Broker, MD  LORazepam (ATIVAN) 0.5 MG tablet Take 1 tablet (0.5 mg total) by mouth daily as needed for anxiety. 07/29/22   Myrlene Broker, MD  Misc Natural Products (GLUCOSAMINE CHOND COMPLEX/MSM) TABS Take 1 tablet by mouth 2 (two) times daily.    [provider]  Multiple Vitamins-Minerals (MULTIVITAMIN WITH MINERALS) tablet Take 1 tablet by mouth daily.    [provider]  Nutritional Supplements (DHEA PO) Take 1 tablet by mouth daily.    [provider]  omeprazole (PRILOSEC) 20 MG capsule Take 1 capsule (20 mg total) by mouth daily. 09/13/22   Unk Lightning, PA  triamcinolone cream (KENALOG) 0.1 % Apply 1 Application topically 2 (two) times daily. 12/20/21   McElwee, Jake Church, NP  Turmeric (QC TUMERIC COMPLEX PO) Take by mouth.    [provider]    Family History Family History  Problem Relation Age of Onset   Diabetes Mother    Deep vein thrombosis Mother        Varicose Veins   Alzheimer's disease Mother    Cancer Father        Father died from brain tumor.    Heart disease Father    Other Brother        Heat stroke   Hypertension Son    Colon cancer Neg Hx    Stomach cancer Neg Hx    Esophageal cancer Neg Hx    Rectal cancer Neg Hx     Social History Social History   Tobacco Use   Smoking status: Every Day    Current packs/day: 0.50    Average packs/day: 0.5 packs/day for 15.0 years (7.5 ttl pk-yrs)    Types: Cigarettes    Passive exposure: Past   Smokeless tobacco: Never   Tobacco comments:    Occassional smoker  Vaping Use   Vaping status: Never Used  Substance Use Topics   Alcohol use: Yes    Comment: occ wine   Drug use: No     Allergies   Permethrin   Review of Systems Review of Systems  Constitutional:  Negative for chills and fever.  HENT:  Positive for congestion. Negative for ear pain and sore throat.   Eyes:  Negative for pain and visual disturbance.  Respiratory:  Positive for wheezing. Negative for cough and shortness of breath.   Cardiovascular:  Negative for chest pain and palpitations.  Gastrointestinal:  Negative for abdominal pain and vomiting.  Genitourinary:  Negative for dysuria and hematuria.  Musculoskeletal:  Negative for arthralgias and back pain.  Skin:  Negative for color change and rash.  Neurological:  Negative for seizures and syncope.  All other systems reviewed and are negative.    Physical Exam Triage Vital Signs ED Triage Vitals [10/06/22 1325]  Encounter Vitals Group     BP (!) 161/89     Systolic BP Percentile      Diastolic BP Percentile      Pulse Rate 77     Resp 18     Temp 99.8 F (37.7 C)     Temp Source Oral     SpO2 92 %     Weight      Height      Head Circumference      Peak Flow      Pain Score 0     Pain Loc      Pain Education      Exclude from Growth Chart    No data found.  Updated Vital Signs BP (!) 161/89 (BP Location: Left Arm)   Pulse 77   Temp 99.8 F (37.7 C) (Oral)   Resp 18   SpO2 92%   Visual Acuity Right Eye Distance:   Left Eye  Distance:   Bilateral Distance:    Right Eye Near:   Left Eye Near:    Bilateral Near:     Physical Exam Vitals and nursing note reviewed.  Constitutional:      General: She is not in acute distress.    Appearance: She is well-developed.  HENT:     Head: Normocephalic and atraumatic.  Eyes:     Conjunctiva/sclera: Conjunctivae normal.  Cardiovascular:     Rate and Rhythm: Normal rate and regular rhythm.     Heart sounds: No murmur heard. Pulmonary:     Effort: Pulmonary effort is normal. No respiratory distress.     Breath sounds: Examination of the left-lower field reveals decreased breath sounds. Decreased breath sounds present.  Abdominal:     Palpations: Abdomen is soft.     Tenderness: There is no abdominal tenderness.  Musculoskeletal:        General: No swelling.     Cervical back: Neck supple.  Skin:    General: Skin is warm and dry.     Capillary Refill: Capillary refill takes less than 2 seconds.  Neurological:     Mental Status: She is alert.  Psychiatric:        Mood and Affect: Mood normal.      UC Treatments / Results  Labs (all labs ordered are listed, but only abnormal results are displayed) Labs Reviewed - No data to display  EKG   Radiology DG Chest 2 View  Result Date: 10/06/2022 CLINICAL DATA:  Cough and fever. EXAM: CHEST - 2 VIEW COMPARISON:  08/11/2015 FINDINGS: Lungs are hyperexpanded with minimal left basilar  opacification which may be due to atelectasis or early infection. This is not well demonstrated on the lateral film. No evidence of effusion. Cardiomediastinal silhouette and remainder the exam is unchanged. IMPRESSION: Minimal left basilar opacification which may be due to atelectasis or early infection. Electronically Signed   By: Elberta Fortis M.D.   On: 10/06/2022 14:01    Procedures Procedures (including critical care time)  Medications Ordered in UC Medications - No data to display  Initial Impression / Assessment and Plan  / UC Course  I have reviewed the triage vital signs and the nursing notes.  Pertinent labs & imaging results that were available during my care of the patient were reviewed by me and considered in my medical decision making (see chart for details).     Will treat for CAP.  Antibiotics prescribed.  Inhaler prescribed to take as needed.  Pt overall well appearing, in no acute distress.  ED precautions given.  Final Clinical Impressions(s) / UC Diagnoses   Final diagnoses:  Pneumonia of left lower lobe due to infectious organism     Discharge Instructions      Take antibiotics as prescribed Use albuterol inhaler as needed for wheezing or shortness of breath.  Can take Tessalon as needed for cough.      ED Prescriptions     Medication Sig Dispense Auth. Provider   albuterol (VENTOLIN HFA) 108 (90 Base) MCG/ACT inhaler Inhale 2 puffs into the lungs every 6 (six) hours as needed for wheezing or shortness of breath. 8 g Ward, Tylene Fantasia, PA-C   benzonatate (TESSALON) 100 MG capsule Take 1 capsule (100 mg total) by mouth every 8 (eight) hours. 21 capsule Ward, Shanda Bumps Z, PA-C   amoxicillin-clavulanate (AUGMENTIN) 875-125 MG tablet Take 1 tablet by mouth 2 (two) times daily for 5 days. 10 tablet Ward, Shanda Bumps Z, PA-C   azithromycin (ZITHROMAX Z-PAK) 250 MG tablet Take 2 tablets by mouth today and then 1 tablet by mouth the following days. 6 each Ward, Tylene Fantasia, PA-C      PDMP not reviewed this encounter.   Ward, Tylene Fantasia, PA-C 10/06/22 1536

## 2022-10-06 NOTE — ED Triage Notes (Signed)
Pt here for cough and congestion x 10 days that has now turned to productive cough and wheezing

## 2022-10-06 NOTE — Discharge Instructions (Addendum)
Take antibiotics as prescribed Use albuterol inhaler as needed for wheezing or shortness of breath.  Can take Tessalon as needed for cough.

## 2022-11-21 ENCOUNTER — Encounter: Payer: Medicare PPO | Admitting: Gastroenterology

## 2022-12-26 ENCOUNTER — Other Ambulatory Visit: Payer: Self-pay | Admitting: Physician Assistant

## 2022-12-31 ENCOUNTER — Ambulatory Visit
Admission: EM | Admit: 2022-12-31 | Discharge: 2022-12-31 | Disposition: A | Discharge status: Home / Self Care | Payer: Medicare PPO | Attending: Internal Medicine | Admitting: Internal Medicine

## 2022-12-31 DIAGNOSIS — M7989 Other specified soft tissue disorders: Secondary | ICD-10-CM

## 2022-12-31 LAB — POCT URINALYSIS DIP (MANUAL ENTRY)
Bilirubin, UA: NEGATIVE
Blood, UA: NEGATIVE
Glucose, UA: NEGATIVE mg/dL
Ketones, POC UA: NEGATIVE mg/dL
Leukocytes, UA: NEGATIVE
Nitrite, UA: NEGATIVE
Protein Ur, POC: NEGATIVE mg/dL
Spec Grav, UA: 1.01 (ref 1.010–1.025)
Urobilinogen, UA: 0.2 U/dL
pH, UA: 7 (ref 5.0–8.0)

## 2022-12-31 NOTE — Discharge Instructions (Signed)
Blood work is pending.  We will call if it is abnormal.  I placed a referral to vascular specialist for follow-up.  Please elevate extremities and apply compression stockings.  Follow-up with primary care doctor as well for further workup.  Go to the ER if you develop worsening symptoms, chest pain, shortness of breath.

## 2022-12-31 NOTE — ED Provider Notes (Addendum)
[facility]    CSN: [id number] Arrival date & time: 12/31/22  1129      History   Chief Complaint Chief Complaint  Patient presents with   Toe Pain    HPI Theresa Perez is a 72 y.o. female.   Patient presents with bilateral lower extremity pain and swelling that has been present for about 1 to 2 weeks.  Denies any injury to the area.  She reports that she does play tennis routinely.  Reports that the pain seemed to start at her toes and is now radiating up to her knees.  Denies any previous history of gout or arthritis.  Denies chest pain or shortness of breath.  Denies any history of cardiac problems.  Patient is not reporting any numbness or tingling. Patient is urinating appropriately.    Toe Pain    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   Complication of anesthesia    vomitting only   Cyst of breast, right, diffuse fibrocystic    Depression    Hypertension    Hypothyroidism    PONV (postoperative nausea and vomiting)     Patient Active Problem List   Diagnosis Date Noted   Toe pain, right 01/25/2022   Bilateral primary osteoarthritis of knee 06/14/2021   Adjustment disorder with mixed anxiety and depressed mood 08/02/2019   Memory change 06/25/2019   Smokers' cough (HCC) 12/11/2017   De Quervain's disease (radial styloid tenosynovitis) 04/01/2017   Dysphagia 12/06/2016   Routine general medical examination at a health care facility 12/16/2014   Constipation 07/13/2014   Papillary thyroid carcinoma (HCC) 03/23/2014   Pure hypercholesterolemia 12/08/2013   Varicose veins of bilateral lower extremities with other complications 12/09/2011   Essential hypertension 05/19/2007   Allergic rhinitis 05/19/2007    Past Surgical History:  Procedure Laterality Date   ANTERIOR AND POSTERIOR REPAIR N/A 03/31/2012   Procedure: ANTERIOR (CYSTOCELE) AND POSTERIOR REPAIR  (RECTOCELE);  Surgeon: Miguel Aschoff, MD;  Location: [facility];  Service: Gynecology;  Laterality: N/A;   aspiration cyst right breast     BREAST BIOPSY Left 01/09/2022   MM LT BREAST BX W LOC DEV 1ST LESION IMAGE BX SPEC STEREO GUIDE 01/09/2022 [facility]   THYROIDECTOMY  02/19/1995   with parathyroidectomy   TONSILLECTOMY  02/19/1967   uterine ablation     VAGINAL HYSTERECTOMY N/A 03/31/2012   Procedure: HYSTERECTOMY VAGINAL;  Surgeon: Miguel Aschoff, MD;  Location: [facility];  Service: Gynecology;  Laterality: N/A;    OB History   No obstetric history on file.      Home Medications    Prior to Admission medications   Medication Sig Start Date End Date Taking? Authorizing Provider  albuterol (VENTOLIN HFA) 108 (90 Base) MCG/ACT inhaler Inhale 2 puffs into the lungs every 6 (six) hours as needed for wheezing or shortness of breath. 10/06/22   Ward, Tylene Fantasia, PA-C  Ascorbic Acid (VITAMIN C PO) Take 1 tablet by mouth daily.    [provider]  atenolol-chlorthalidone (TENORETIC) 50-25 MG tablet Take 0.5 tablets by mouth daily. 07/29/22   Myrlene Broker, MD  azithromycin (ZITHROMAX Z-PAK) 250 MG tablet Take 2 tablets by mouth today and then 1 tablet by mouth the following days. 10/06/22   Ward, Tylene Fantasia, PA-C  benzonatate (TESSALON) 100 MG capsule Take 1 capsule (100 mg total) by mouth every 8 (eight)  hours. 10/06/22   Ward, Tylene Fantasia, PA-C  Black Cohosh 540 MG CAPS Take 540 mg by mouth 2 (two) times daily.     [provider]  buPROPion (WELLBUTRIN XL) 150 MG 24 hr tablet Take 1 tablet (150 mg total) by mouth daily. 07/29/22   Myrlene Broker, MD  butalbital-acetaminophen-caffeine (ESGIC) [phone] MG tablet Take 1 tablet by mouth 2 (two) times daily as needed for headache. 01/07/20   Olive Bass, FNP  CALCIUM PO Take 1 tablet by mouth daily.    [provider]  COENZYME Q-10 PO Take 1 tablet by mouth daily.    [provider]   Cyanocobalamin (VITAMIN B-12 PO) Take 1 tablet by mouth daily.    [provider]  DULoxetine (CYMBALTA) 60 MG capsule Take 1 capsule (60 mg total) by mouth 2 (two) times daily. 07/29/22   Myrlene Broker, MD  ECHINACEA EXTRACT PO Take by mouth.    [provider]  EVENING PRIMROSE OIL PO Take by mouth.    [provider]  ferrous sulfate 325 (65 FE) MG tablet Take 325 mg by mouth every evening.     [provider]  fluticasone Aleda Grana) 50 MCG/ACT nasal spray Use 2 spray in each nostril once daily 07/29/22   Myrlene Broker, MD  Ginkgo Biloba (GNP GINGKO BILOBA EXTRACT PO) Take 1 tablet by mouth daily.    [provider]  levothyroxine (SYNTHROID) 112 MCG tablet Take 1 tablet (112 mcg total) by mouth daily. 08/02/22   Myrlene Broker, MD  LORazepam (ATIVAN) 0.5 MG tablet Take 1 tablet (0.5 mg total) by mouth daily as needed for anxiety. 07/29/22   Myrlene Broker, MD  Misc Natural Products (GLUCOSAMINE CHOND COMPLEX/MSM) TABS Take 1 tablet by mouth 2 (two) times daily.    [provider]  Multiple Vitamins-Minerals (MULTIVITAMIN WITH MINERALS) tablet Take 1 tablet by mouth daily.    [provider]  Nutritional Supplements (DHEA PO) Take 1 tablet by mouth daily.    [provider]  omeprazole (PRILOSEC) 20 MG capsule Take 1 capsule by mouth once daily 12/27/22   Unk Lightning, PA  triamcinolone cream (KENALOG) 0.1 % Apply 1 Application topically 2 (two) times daily. 12/20/21   McElwee, Jake Church, NP  Turmeric (QC TUMERIC COMPLEX PO) Take by mouth.    [provider]    Family History Family History  Problem Relation Age of Onset   Diabetes Mother    Deep vein thrombosis Mother        Varicose Veins   Alzheimer's disease Mother    Cancer Father        Father died from brain tumor.   Heart disease Father    Other Brother        Heat stroke   Hypertension Son    Colon cancer Neg Hx     Stomach cancer Neg Hx    Esophageal cancer Neg Hx    Rectal cancer Neg Hx     Social History Social History   Tobacco Use   Smoking status: Every Day    Current packs/day: 0.50    Average packs/day: 0.5 packs/day for 15.0 years (7.5 ttl pk-yrs)    Types: Cigarettes    Passive exposure: Past   Smokeless tobacco: Never   Tobacco comments:    Occassional smoker  Vaping Use   Vaping status: Never Used  Substance Use Topics   Alcohol use: Yes    Comment:  occ wine   Drug use: No     Allergies   Permethrin   Review of Systems Review of Systems Per HPI  Physical Exam Triage Vital Signs ED Triage Vitals  Encounter Vitals Group     BP 12/31/22 1311 (!) 172/102     Systolic BP Percentile --      Diastolic BP Percentile --      Pulse Rate 12/31/22 1302 65     Resp 12/31/22 1302 20     Temp 12/31/22 1302 98.1 F (36.7 C)     Temp Source 12/31/22 1302 Oral     SpO2 12/31/22 1302 98 %     Weight 12/31/22 1308 192 lb (87.1 kg)     Height 12/31/22 1308 5\' 9"  (1.753 m)     Head Circumference --      Peak Flow --      Pain Score 12/31/22 1308 0     Pain Loc --      Pain Education --      Exclude from Growth Chart --    No data found.  Updated Vital Signs BP (!) 158/93 (BP Location: Right Arm)   Pulse 65   Temp 98.1 F (36.7 C) (Oral)   Resp 20   Ht 5\' 9"  (1.753 m)   Wt 192 lb (87.1 kg)   SpO2 98%   BMI 28.35 kg/m   Visual Acuity Right Eye Distance:   Left Eye Distance:   Bilateral Distance:    Right Eye Near:   Left Eye Near:    Bilateral Near:     Physical Exam Constitutional:      General: She is not in acute distress.    Appearance: Normal appearance. She is not toxic-appearing or diaphoretic.  HENT:     Head: Normocephalic and atraumatic.  Eyes:     Extraocular Movements: Extraocular movements intact.     Conjunctiva/sclera: Conjunctivae normal.  Cardiovascular:     Rate and Rhythm: Normal rate and regular rhythm.     Pulses: Normal  pulses.     Heart sounds: Normal heart sounds.  Pulmonary:     Effort: Pulmonary effort is normal. No respiratory distress.     Breath sounds: Normal breath sounds.  Skin:    Comments: Patient has mild nonpitting edema present throughout bilateral lower extremities that starts at the feet and radiates up directly below the knees..  Various areas of redness noted.  Capillary refill and pulses intact.  No abrasions or lacerations noted.  Neurological:     General: No focal deficit present.     Mental Status: She is alert and oriented to person, place, and time. Mental status is at baseline.  Psychiatric:        Mood and Affect: Mood normal.        Behavior: Behavior normal.        Thought Content: Thought content normal.        Judgment: Judgment normal.      UC Treatments / Results  Labs (all labs ordered are listed, but only abnormal results are displayed) Labs Reviewed  CBC  BASIC METABOLIC PANEL  POCT URINALYSIS DIP (MANUAL ENTRY)    EKG   Radiology No results found.  Procedures Procedures (including critical care time)  Medications Ordered in UC Medications - No data to display  Initial Impression / Assessment and Plan / UC Course  I have reviewed the triage vital signs and the nursing notes.  Pertinent labs & imaging results  that were available during my care of the patient were reviewed by me and considered in my medical decision making (see chart for details).     Differential diagnoses include vasculitis versus CHF.  Patient is denying shortness of breath or chest pain and patient does not have any adventitious lung sounds on exam so do not think that chest imaging is necessary.  Recommended elevation of extremities and compression stockings.  Will obtain BMP and CBC.  I do not have any concern for DVT, cellulitis, gout, arthritis.  Given no injury, imaging was deferred.  Patient advised to follow-up with PCP as soon as possible.  I do think that obtaining a BNP  is reasonable but I do not have access to this in urgent care given limited resources.  Patient may also need to see vascular so ambulatory referral was placed for this today.  Blood pressure elevated today but recheck was improved.  UA normal. Advised patient to monitor blood pressure diligently at home as well.  Advised strict return and ER precautions.  Patient verbalized understanding and was agreeable with plan. Final Clinical Impressions(s) / UC Diagnoses   Final diagnoses:  Localized swelling of both lower extremities     Discharge Instructions      Blood work is pending.  We will call if it is abnormal.  I placed a referral to vascular specialist for follow-up.  Please elevate extremities and apply compression stockings.  Follow-up with primary care doctor as well for further workup.  Go to the ER if you develop worsening symptoms, chest pain, shortness of breath.    ED Prescriptions   None    PDMP not reviewed this encounter.   Gustavus Bryant, [place] 12/31/22 1415    Gustavus Bryant, [place] 12/31/22 1415

## 2022-12-31 NOTE — ED Triage Notes (Signed)
Patient presents with toe discomfort on bilateral foot that started a 1.5 weeks, pt states feet, legs and knees are swollen and she feels she has gout. Patient states this discomfort travels up to her knees. No treatment used.

## 2023-01-01 LAB — BASIC METABOLIC PANEL
BUN/Creatinine Ratio: 9 — ABNORMAL LOW (ref 12–28)
BUN: 6 mg/dL — ABNORMAL LOW (ref 8–27)
CO2: 26 mmol/L (ref 20–29)
Calcium: 8.9 mg/dL (ref 8.7–10.3)
Chloride: 103 mmol/L (ref 96–106)
Creatinine, Ser: 0.7 mg/dL (ref 0.57–1.00)
Glucose: 86 mg/dL (ref 70–99)
Potassium: 4 mmol/L (ref 3.5–5.2)
Sodium: 143 mmol/L (ref 134–144)
eGFR: 92 mL/min/{1.73_m2} (ref >59)

## 2023-01-01 LAB — CBC
Hematocrit: 43.7 % (ref 34.0–46.6)
Hemoglobin: 14.1 g/dL (ref 11.1–15.9)
MCH: 29.8 pg (ref 26.6–33.0)
MCHC: 32.3 g/dL (ref 31.5–35.7)
MCV: 92 fL (ref 79–97)
Platelets: 321 10*3/uL (ref 150–450)
RBC: 4.73 x10E6/uL (ref 3.77–5.28)
RDW: 12.9 % (ref 11.7–15.4)
WBC: 5.9 10*3/uL (ref 3.4–10.8)

## 2023-01-13 ENCOUNTER — Encounter (HOSPITAL_COMMUNITY): Payer: Medicare PPO

## 2023-01-13 ENCOUNTER — Ambulatory Visit (HOSPITAL_COMMUNITY)
Admission: RE | Admit: 2023-01-13 | Discharge: 2023-01-13 | Disposition: A | Discharge status: Home / Self Care | Payer: Medicare PPO | Source: Ambulatory Visit | Attending: Surgery | Admitting: Surgery

## 2023-01-13 ENCOUNTER — Ambulatory Visit: Payer: Medicare PPO | Admitting: Physician Assistant

## 2023-01-13 ENCOUNTER — Other Ambulatory Visit: Payer: Self-pay | Admitting: *Deleted

## 2023-01-13 VITALS — BP 127/80 | HR 61 | Temp 98.1°F | Ht 69.0 in | Wt 193.9 lb

## 2023-01-13 DIAGNOSIS — I83893 Varicose veins of bilateral lower extremities with other complications: Secondary | ICD-10-CM | POA: Diagnosis not present

## 2023-01-13 DIAGNOSIS — M7989 Other specified soft tissue disorders: Secondary | ICD-10-CM

## 2023-01-13 NOTE — Progress Notes (Signed)
VASCULAR & VEIN SPECIALISTS           OF [place]  History and Physical   Theresa Perez is a 72 y.o. female who presents with bilateral lower leg and feet swelling.   She has been seen in our office in the past for venous insufficiency.  She has hx of right GSV laser ablation on 06/01/2012 by Dr. Hart Rochester.  She underwent left GSV laser ablation on 06/15/2012 by Dr. Hart Rochester.  She also underwent BLE sclerotherapy in 2014.  She states that she recently went to [place] with her grandchildren.  She states that after a couple of days there, she noticed more swelling in her feet and lower legs.  She states that her feet were so swollen you couldn't see the veins in her feet.  She states that since then, the swelling has improved but she still feels like her toes are swollen and tight.  She does have some tingling in her feet.  She does have some superficial varicosities in both legs and these have not ever bled in the past.   She has not had a DVT.  She does have significant family hx with her mother side of the family with swelling and varicose veins. She states that she did order some compression online and this has helped.  She states that she got some that were a little too tight and she returned those and has had better results with the lesser compression.  She does have mild skin changes on her lower legs.  She denies any claudication sx.  She did have to have part of the right great toe nail removed due to a hereditary condition.  She broke the right 4th toe on the right foot and it stays swollen.  She states that she slept with her legs significantly elevated last night and woke up with her legs and feet painful and tingling.  This did improve with being up and about.  She stays active and works out at [employer] 2-3x/week.  She is planning a trip to [place] in May of next year.  She states that she donates plasma 1-2x/week.  She feels this is a calling to help others.   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  She has a mini Architectural technologist.  The pt is not on a statin for cholesterol management.  The pt is not on a daily aspirin.   Other AC:  none The pt is on BB for hypertension.   The pt is not on medication for diabetes.    Pt does not have family hx of AAA.  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   Complication of anesthesia    vomitting only   Cyst of breast, right, diffuse fibrocystic    Depression    Hypertension    Hypothyroidism    PONV (postoperative nausea and vomiting)     Past Surgical History:  Procedure Laterality Date   ANTERIOR AND POSTERIOR REPAIR N/A 03/31/2012   Procedure: ANTERIOR (CYSTOCELE) AND POSTERIOR REPAIR (RECTOCELE);  Surgeon: Miguel Aschoff, MD;  Location: [facility];  Service: Gynecology;  Laterality: N/A;   aspiration cyst right breast     BREAST BIOPSY Left 01/09/2022   MM LT BREAST BX W LOC DEV 1ST LESION IMAGE BX SPEC  STEREO GUIDE 01/09/2022 [facility]   THYROIDECTOMY  02/19/1995   with parathyroidectomy   TONSILLECTOMY  02/19/1967   uterine ablation     VAGINAL HYSTERECTOMY N/A 03/31/2012   Procedure: HYSTERECTOMY VAGINAL;  Surgeon: Miguel Aschoff, MD;  Location: [facility];  Service: Gynecology;  Laterality: N/A;    Social History   Socioeconomic History   Marital status: Married    Spouse name: Not on file   Number of children: 1   Years of education: 14   Highest education level: Associate degree: academic program  Occupational History   Occupation: retired  Tobacco Use   Smoking status: Every Day    Current packs/day: 0.50    Average packs/day: 0.5 packs/day for 15.0 years (7.5 ttl pk-yrs)    Types: Cigarettes    Passive exposure: Past   Smokeless tobacco: Never   Tobacco comments:    Occassional smoker  Vaping Use   Vaping status: Never Used  Substance and Sexual Activity   Alcohol use: Yes     Comment: occ wine   Drug use: No   Sexual activity: Yes    Birth control/protection: Post-menopausal  Other Topics Concern   Not on file  Social History Narrative   Left handed   Drinks caffeine   Two story home   Social Determinants of Health   Financial Resource Strain: Low Risk  (07/22/2022)   Overall Financial Resource Strain (CARDIA)    Difficulty of Paying Living Expenses: Not hard at all  Food Insecurity: No Food Insecurity (07/22/2022)   Hunger Vital Sign    Worried About Running Out of Food in the Last Year: Never true    Ran Out of Food in the Last Year: Never true  Transportation Needs: No Transportation Needs (07/22/2022)   PRAPARE - Administrator, Civil Service (Medical): No    Lack of Transportation (Non-Medical): No  Physical Activity: Sufficiently Active (07/22/2022)   Exercise Vital Sign    Days of Exercise per Week: 5 days    Minutes of Exercise per Session: 60 min  Stress: No Stress Concern Present (07/22/2022)   [employer] of Occupational Health - Occupational Stress Questionnaire    Feeling of Stress : Not at all  Social Connections: Socially Integrated (07/22/2022)   Social Connection and Isolation Panel [NHANES]    Frequency of Communication with Friends and Family: More than three times a week    Frequency of Social Gatherings with Friends and Family: More than three times a week    Attends Religious Services: More than 4 times per year    Active Member of [employer] or Organizations: Yes    Attends Engineer, structural: More than 4 times per year    Marital Status: Married  Catering manager Violence: Not At Risk (07/22/2022)   Humiliation, Afraid, Rape, and Kick questionnaire    Fear of Current or Ex-Partner: No    Emotionally Abused: No    Physically Abused: No    Sexually Abused: No    Family History  Problem Relation Age of Onset   Diabetes Mother    Deep vein thrombosis Mother        Varicose Veins   Alzheimer's disease Mother     Cancer Father        Father died from brain tumor.   Heart disease Father    Other Brother        Heat stroke   Hypertension Son    Colon cancer Neg Hx  Stomach cancer Neg Hx    Esophageal cancer Neg Hx    Rectal cancer Neg Hx     Current Outpatient Medications  Medication Sig Dispense Refill   albuterol (VENTOLIN HFA) 108 (90 Base) MCG/ACT inhaler Inhale 2 puffs into the lungs every 6 (six) hours as needed for wheezing or shortness of breath. 8 g 0   Ascorbic Acid (VITAMIN C PO) Take 1 tablet by mouth daily.     atenolol-chlorthalidone (TENORETIC) 50-25 MG tablet Take 0.5 tablets by mouth daily. 45 tablet 3   azithromycin (ZITHROMAX Z-PAK) 250 MG tablet Take 2 tablets by mouth today and then 1 tablet by mouth the following days. 6 each 0   benzonatate (TESSALON) 100 MG capsule Take 1 capsule (100 mg total) by mouth every 8 (eight) hours. 21 capsule 0   Black Cohosh 540 MG CAPS Take 540 mg by mouth 2 (two) times daily.      buPROPion (WELLBUTRIN XL) 150 MG 24 hr tablet Take 1 tablet (150 mg total) by mouth daily. 90 tablet 3   butalbital-acetaminophen-caffeine (ESGIC) 50-325-40 MG tablet Take 1 tablet by mouth 2 (two) times daily as needed for headache. 30 tablet 0   CALCIUM PO Take 1 tablet by mouth daily.     COENZYME Q-10 PO Take 1 tablet by mouth daily.     Cyanocobalamin (VITAMIN B-12 PO) Take 1 tablet by mouth daily.     DULoxetine (CYMBALTA) 60 MG capsule Take 1 capsule (60 mg total) by mouth 2 (two) times daily. 180 capsule 3   ECHINACEA EXTRACT PO Take by mouth.     EVENING PRIMROSE OIL PO Take by mouth.     ferrous sulfate 325 (65 FE) MG tablet Take 325 mg by mouth every evening.      fluticasone (FLONASE) 50 MCG/ACT nasal spray Use 2 spray in each nostril once daily 48 g 3   Ginkgo Biloba (GNP GINGKO BILOBA EXTRACT PO) Take 1 tablet by mouth daily.     levothyroxine (SYNTHROID) 112 MCG tablet Take 1 tablet (112 mcg total) by mouth daily. 90 tablet 3   LORazepam  (ATIVAN) 0.5 MG tablet Take 1 tablet (0.5 mg total) by mouth daily as needed for anxiety. 20 tablet 0   Misc Natural Products (GLUCOSAMINE CHOND COMPLEX/MSM) TABS Take 1 tablet by mouth 2 (two) times daily.     Multiple Vitamins-Minerals (MULTIVITAMIN WITH MINERALS) tablet Take 1 tablet by mouth daily.     Nutritional Supplements (DHEA PO) Take 1 tablet by mouth daily.     omeprazole (PRILOSEC) 20 MG capsule Take 1 capsule by mouth once daily 30 capsule 0   triamcinolone cream (KENALOG) 0.1 % Apply 1 Application topically 2 (two) times daily. 30 g 0   Turmeric (QC TUMERIC COMPLEX PO) Take by mouth.     No current facility-administered medications for this visit.    Allergies  Allergen Reactions   Permethrin Rash    REVIEW OF SYSTEMS:   [X]  denotes positive finding, [ ]  denotes negative finding Cardiac  Comments:  Chest pain or chest pressure:    Shortness of breath upon exertion:    Short of breath when lying flat:    Irregular heart rhythm:        Vascular    Pain in calf, thigh, or hip brought on by ambulation:    Pain in feet at night that wakes you up from your sleep:     Blood clot in your veins:    Leg swelling:  x       Pulmonary    Oxygen at home:    Productive cough:     Wheezing:         Neurologic    Sudden weakness in arms or legs:     Sudden numbness in arms or legs:     Sudden onset of difficulty speaking or slurred speech:    Temporary loss of vision in one eye:     Problems with dizziness:         Gastrointestinal    Blood in stool:     Vomited blood:         Genitourinary    Burning when urinating:     Blood in urine:        Psychiatric    Major depression:         Hematologic    Bleeding problems:    Problems with blood clotting too easily:        Skin    Rashes or ulcers:        Constitutional    Fever or chills:      PHYSICAL EXAMINATION:  Today's Vitals   01/13/23 1315  BP: 127/80  Pulse: 61  Temp: 98.1 F (36.7 C)   TempSrc: Temporal  SpO2: 95%  Weight: 193 lb 14.4 oz (88 kg)  Height: 5\' 9"  (1.753 m)   Body mass index is 28.63 kg/m.   General:  WDWN in NAD; vital signs documented above Gait: Not observed HENT: WNL, normocephalic Pulmonary: normal non-labored breathing without wheezing Cardiac: regular HR; without carotid bruits Abdomen: soft, NT, aortic pulse is not palpable Skin: without rashes Vascular Exam/Pulses:  Right Left  Radial 2+ (normal) 2+ (normal)  DP 2+ (normal) 2+ (normal)   Extremities: mild BLE swelling.  + mild hemosiderin staining.  -Stemmer sign.  + superficial varicose veins bilaterally  Neurologic: A&O X 3;  moving all extremities equally Psychiatric:  The pt has Normal affect.   Non-Invasive Vascular Imaging:   Venous duplex on 01/13/2023: Venous Reflux Times  +--------------+---------+------+-----------+------------+-----------------  ----+  RIGHT        Reflux NoRefluxReflux TimeDiameter cmsComments                                  Yes                                                +--------------+---------+------+-----------+------------+-----------------   CFV          no                                    770 ms            +--------------+---------+------+-----------+------------+-----------------  FV prox       no                                                      +--------------+---------+------+-----------+------------+-----------------  FV mid        no                                                      +--------------+---------+------+-----------+------------+-----------------  FV dist       no                                                      +--------------+---------+------+-----------+------------+-----------------  Popliteal    no                                    660 ms            +--------------+---------+------+-----------+------------+-----------------  GSV at SFJ              yes    >500  ms     0.888     via superior epigastric vein   +--------------+---------+------+-----------+------------+-----------------  GSV prox thigh                                      prior ablation/stripping     +--------------+---------+------+-----------+------------+-----------------  GSV mid thigh                                        prior ablation/stripping     +--------------+---------+------+-----------+------------+-----------------  GSV dist thigh                                       prior ablation/stripping     +--------------+---------+------+-----------+------------+-----------------  GSV at knee                                          prior ablation/stripping     +--------------+---------+------+-----------+------------+-----------------  SSV Pop Fossa           yes    >500 ms     0.292                          +--------------+---------+------+-----------+------------+-----------------  SSV prox calf           yes    >500 ms      0.26                      +--------------+---------+------+-----------+------------+-----------------  SSV mid calf            yes    >500 ms     0.247                      +--------------+---------+------+-----------+------------+-----------------   Summary:  Right:  - No evidence of deep vein thrombosis seen in the right lower extremity, from the common femoral through the popliteal veins.  - No evidence of superficial venous thrombosis in the right lower extremity.  - No evidence of significant deep vein reflux.     Theresa Perez is a 72 y.o. female who presents with: BLE swelling    -pt has easily palpable DP pedal pulses bilaterally -pt does not have evidence of DVT.  Pt does have venous reflux in the right GSV at the  SFJ as well as the SSV.  The SSV measures 0.2cm.  she is not a candidate for SSV laser ablation.  Discussed that she can follow up with her PCP to discuss diuretic or other  reasons for leg swelling.  -discussed with pt about wearing either knee or thigh high 15-20 mmHg compression stockings and pt was measured for these today so that she will have her size to get the correct size.  -discussed the importance of leg elevation and how to elevate properly - pt is advised to elevate their legs and a diagram is given to them to demonstrate for pt to lay flat on their back with knees elevated and slightly bent with their feet higher than their knees, which puts their feet higher than their heart for 15 minutes per day.  If pt cannot lay flat, advised to lay as flat as possible.  -pt is advised to continue as much walking as possible and avoid sitting or standing for long periods of time.  It is ok for her to continue to exercise at [facility] from vascular standpoint -discussed importance of weight loss and exercise and that water aerobics would also be beneficial. She does have a swimming pool.  -handout with recommendations given -pt will f/u as needed.  She is not interested in sclerotherapy.     Doreatha Massed, [facility] [phone]  Clinic MD:  Hetty Blend on call MD

## 2023-02-20 NOTE — Progress Notes (Signed)
   I, Theresa Perez, CMA acting as a scribe for Theresa Lloyd, MD.  Theresa Perez is a 73 y.o. female who presents to [facility] today for R knee pain. Pt was previously seen by Dr. Lloyd in 2021 for a R clavicular fx.  Today, pt c/o R knee pain intermittently over several years, worsening over the past couple of months. Swelling and mechanical sx present. Denies injury. Feels like a toothache. Plays tennis, doesn't typically bother her then.   Knee swelling: yes Mechanical symptoms: yes Radiates: no Aggravates: intermittent Treatments tried: topical anlagesic  Dx testing: 01/13/23 LE venous refulx 05/01/22 R knee XR  Pertinent review of systems: No fevers or chills.  Positive for cough and tightness.  Relevant historical information: Current smoker. No knee arthritis  Exam:  BP (!) 160/94   Pulse 88   Ht 5' 9 (1.753 m)   Wt 190 lb (86.2 kg)   SpO2 97%   BMI 28.06 kg/m  General: Well Developed, well nourished, and in no acute distress.   MSK: Right knee mild effusion normal motion. Tender palpation medial joint line. Stable ligamentous exam. Intact strength.    Lab and Radiology Results  Procedure: Real-time Ultrasound Guided Injection of right knee joint superior lateral patella space Device: Philips Affiniti 50G/GE Logiq Images permanently stored and available for review in PACS Verbal informed consent obtained.  Discussed risks and benefits of procedure. Warned about infection, bleeding, hyperglycemia damage to structures among others. Patient expresses understanding and agreement Time-out conducted.   Noted no overlying erythema, induration, or other signs of local infection.   Skin prepped in a sterile fashion.   Local anesthesia: Topical Ethyl chloride.   With sterile technique and under real time ultrasound guidance: 40 mg of Kenalog  and 2 mL of Marcaine  injected into knee joint. Fluid seen entering the joint capsule.    Completed without difficulty   Pain immediately resolved suggesting accurate placement of the medication.   Advised to call if fevers/chills, erythema, induration, drainage, or persistent bleeding.   Images permanently stored and available for review in the ultrasound unit.  Impression: Technically successful ultrasound guided injection.    X-ray images right knee obtained March 2024 personally and independently interpreted today. Moderate to severe medial and patellofemoral DJD.     Assessment and Plan: 73 y.o. female with exacerbation of chronic right knee pain due to exacerbation of underlying arthritis.  Plan for steroid injection today.  If not sufficient we could arrange for next steps including repeat x-ray and for hyaluronic acid injections or Zilretta  injections.  Additionally she is having a bit of cough and congestion and is a current smoker.  She is not using her pre-existing albuterol .  She plans to go to urgent care after today's visit with me.  I think that is a good idea.   PDMP not reviewed this encounter. Orders Placed This Encounter  Procedures   US  LIMITED JOINT SPACE STRUCTURES LOW RIGHT(NO LINKED CHARGES)    Reason for Exam (SYMPTOM  OR DIAGNOSIS REQUIRED):   right knee pain    Preferred imaging location?:   [facility]   No orders of the defined types were placed in this encounter.    Discussed warning signs or symptoms. Please see discharge instructions. Patient expresses understanding.   The above documentation has been reviewed and is accurate and complete Theresa Perez, M.D.

## 2023-02-21 ENCOUNTER — Encounter (HOSPITAL_COMMUNITY): Payer: Medicare PPO

## 2023-02-21 ENCOUNTER — Observation Stay (HOSPITAL_COMMUNITY)
Admission: EM | Admit: 2023-02-21 | Discharge: 2023-02-22 | Disposition: A | Discharge status: Home / Self Care | Payer: Medicare PPO | Attending: Internal Medicine | Admitting: Internal Medicine

## 2023-02-21 ENCOUNTER — Encounter (HOSPITAL_COMMUNITY): Payer: Self-pay

## 2023-02-21 ENCOUNTER — Emergency Department (HOSPITAL_COMMUNITY): Payer: Medicare PPO

## 2023-02-21 ENCOUNTER — Other Ambulatory Visit: Payer: Self-pay

## 2023-02-21 ENCOUNTER — Encounter: Payer: Medicare PPO | Admitting: Vascular Surgery

## 2023-02-21 ENCOUNTER — Ambulatory Visit: Payer: Medicare PPO | Admitting: Family Medicine

## 2023-02-21 ENCOUNTER — Ambulatory Visit
Admission: EM | Admit: 2023-02-21 | Discharge: 2023-02-21 | Disposition: A | Discharge status: Home / Self Care | Payer: Medicare PPO | Attending: Internal Medicine | Admitting: Internal Medicine

## 2023-02-21 ENCOUNTER — Encounter: Payer: Self-pay | Admitting: Family Medicine

## 2023-02-21 VITALS — BP 160/94 | HR 88 | Ht 69.0 in | Wt 190.0 lb

## 2023-02-21 DIAGNOSIS — E039 Hypothyroidism, unspecified: Secondary | ICD-10-CM | POA: Diagnosis not present

## 2023-02-21 DIAGNOSIS — Z85850 Personal history of malignant neoplasm of thyroid: Secondary | ICD-10-CM | POA: Insufficient documentation

## 2023-02-21 DIAGNOSIS — Z1152 Encounter for screening for COVID-19: Secondary | ICD-10-CM | POA: Diagnosis not present

## 2023-02-21 DIAGNOSIS — M25561 Pain in right knee: Secondary | ICD-10-CM

## 2023-02-21 DIAGNOSIS — R0789 Other chest pain: Secondary | ICD-10-CM | POA: Diagnosis not present

## 2023-02-21 DIAGNOSIS — N6312 Unspecified lump in the right breast, upper inner quadrant: Secondary | ICD-10-CM | POA: Insufficient documentation

## 2023-02-21 DIAGNOSIS — R0602 Shortness of breath: Secondary | ICD-10-CM | POA: Diagnosis not present

## 2023-02-21 DIAGNOSIS — F17210 Nicotine dependence, cigarettes, uncomplicated: Secondary | ICD-10-CM | POA: Diagnosis not present

## 2023-02-21 DIAGNOSIS — R9389 Abnormal findings on diagnostic imaging of other specified body structures: Secondary | ICD-10-CM | POA: Diagnosis not present

## 2023-02-21 DIAGNOSIS — R053 Chronic cough: Secondary | ICD-10-CM

## 2023-02-21 DIAGNOSIS — R062 Wheezing: Secondary | ICD-10-CM | POA: Diagnosis not present

## 2023-02-21 DIAGNOSIS — G8929 Other chronic pain: Secondary | ICD-10-CM

## 2023-02-21 DIAGNOSIS — R079 Chest pain, unspecified: Secondary | ICD-10-CM | POA: Diagnosis not present

## 2023-02-21 DIAGNOSIS — R918 Other nonspecific abnormal finding of lung field: Principal | ICD-10-CM | POA: Insufficient documentation

## 2023-02-21 DIAGNOSIS — F419 Anxiety disorder, unspecified: Secondary | ICD-10-CM | POA: Diagnosis present

## 2023-02-21 DIAGNOSIS — E876 Hypokalemia: Secondary | ICD-10-CM | POA: Diagnosis present

## 2023-02-21 DIAGNOSIS — F32A Depression, unspecified: Secondary | ICD-10-CM | POA: Diagnosis present

## 2023-02-21 DIAGNOSIS — J90 Pleural effusion, not elsewhere classified: Secondary | ICD-10-CM | POA: Diagnosis not present

## 2023-02-21 DIAGNOSIS — M1711 Unilateral primary osteoarthritis, right knee: Secondary | ICD-10-CM

## 2023-02-21 DIAGNOSIS — R059 Cough, unspecified: Secondary | ICD-10-CM | POA: Diagnosis not present

## 2023-02-21 DIAGNOSIS — Z79899 Other long term (current) drug therapy: Secondary | ICD-10-CM | POA: Diagnosis not present

## 2023-02-21 DIAGNOSIS — E890 Postprocedural hypothyroidism: Secondary | ICD-10-CM | POA: Diagnosis not present

## 2023-02-21 DIAGNOSIS — I10 Essential (primary) hypertension: Secondary | ICD-10-CM | POA: Diagnosis not present

## 2023-02-21 DIAGNOSIS — J9811 Atelectasis: Secondary | ICD-10-CM | POA: Diagnosis not present

## 2023-02-21 LAB — COMPREHENSIVE METABOLIC PANEL
ALT: 21 U/L (ref 0–44)
AST: 24 U/L (ref 15–41)
Albumin: 3.7 g/dL (ref 3.5–5.0)
Alkaline Phosphatase: 47 U/L (ref 38–126)
Anion gap: 14 (ref 5–15)
BUN: 8 mg/dL (ref 8–23)
CO2: 24 mmol/L (ref 22–32)
Calcium: 9.6 mg/dL (ref 8.9–10.3)
Chloride: 99 mmol/L (ref 98–111)
Creatinine, Ser: 0.72 mg/dL (ref 0.44–1.00)
GFR, Estimated: >60 mL/min (ref >60)
Glucose, Bld: 105 mg/dL — ABNORMAL HIGH (ref 70–99)
Potassium: 3.3 mmol/L — ABNORMAL LOW (ref 3.5–5.1)
Sodium: 137 mmol/L (ref 135–145)
Total Bilirubin: 1 mg/dL (ref 0.0–1.2)
Total Protein: 6.9 g/dL (ref 6.5–8.1)

## 2023-02-21 LAB — CBC
HCT: 42.3 % (ref 36.0–46.0)
Hemoglobin: 14.2 g/dL (ref 12.0–15.0)
MCH: 30 pg (ref 26.0–34.0)
MCHC: 33.6 g/dL (ref 30.0–36.0)
MCV: 89.4 fL (ref 80.0–100.0)
Platelets: 366 10*3/uL (ref 150–400)
RBC: 4.73 MIL/uL (ref 3.87–5.11)
RDW: 12.4 % (ref 11.5–15.5)
WBC: 9.4 10*3/uL (ref 4.0–10.5)
nRBC: 0 % (ref 0.0–0.2)

## 2023-02-21 LAB — RESP PANEL BY RT-PCR (RSV, FLU A&B, COVID)  RVPGX2
Influenza A by PCR: NEGATIVE
Influenza B by PCR: NEGATIVE
Resp Syncytial Virus by PCR: NEGATIVE
SARS Coronavirus 2 by RT PCR: NEGATIVE

## 2023-02-21 LAB — TROPONIN I (HIGH SENSITIVITY)
Troponin I (High Sensitivity): 6 ng/L (ref <18)
Troponin I (High Sensitivity): 6 ng/L (ref <18)

## 2023-02-21 MED ORDER — LEVOTHYROXINE SODIUM 112 MCG PO TABS: 112.0000 ug | ORAL_TABLET | Freq: Every day | ORAL | Status: DC

## 2023-02-21 MED ORDER — LEVOTHYROXINE SODIUM 112 MCG PO TABS
112.0000 ug | ORAL_TABLET | Freq: Every day | ORAL | Status: DC
  Administered 2023-02-22: 112 ug via ORAL
  Filled 2023-02-21: qty 1

## 2023-02-21 MED ORDER — ATENOLOL-CHLORTHALIDONE 50-25 MG PO TABS: 0.5000 | ORAL_TABLET | Freq: Every day | ORAL | Status: DC

## 2023-02-21 MED ORDER — ALBUTEROL SULFATE (2.5 MG/3ML) 0.083% IN NEBU: 3.0000 mL | INHALATION_SOLUTION | Freq: Four times a day (QID) | RESPIRATORY_TRACT | Status: DC | PRN

## 2023-02-21 MED ORDER — ONDANSETRON HCL 4 MG/2ML IJ SOLN: 4.0000 mg | Freq: Four times a day (QID) | INTRAMUSCULAR | Status: DC | PRN

## 2023-02-21 MED ORDER — ONDANSETRON HCL 4 MG PO TABS: 4.0000 mg | ORAL_TABLET | Freq: Four times a day (QID) | ORAL | Status: DC | PRN

## 2023-02-21 MED ORDER — IOHEXOL 350 MG/ML SOLN
75.0000 mL | Freq: Once | INTRAVENOUS | Status: AC | PRN
  Administered 2023-02-21: 75 mL via INTRAVENOUS

## 2023-02-21 MED ORDER — ACETAMINOPHEN 650 MG RE SUPP: 650.0000 mg | Freq: Four times a day (QID) | RECTAL | Status: DC | PRN

## 2023-02-21 MED ORDER — BUPROPION HCL ER (XL) 150 MG PO TB24
150.0000 mg | ORAL_TABLET | Freq: Every day | ORAL | Status: DC
  Administered 2023-02-22: 150 mg via ORAL
  Filled 2023-02-21: qty 1

## 2023-02-21 MED ORDER — PANTOPRAZOLE SODIUM 40 MG PO TBEC
40.0000 mg | DELAYED_RELEASE_TABLET | Freq: Every day | ORAL | Status: DC
  Administered 2023-02-22: 40 mg via ORAL
  Filled 2023-02-21: qty 1

## 2023-02-21 MED ORDER — SODIUM CHLORIDE 0.9% FLUSH
3.0000 mL | Freq: Two times a day (BID) | INTRAVENOUS | Status: DC
  Administered 2023-02-21 – 2023-02-22 (×2): 3 mL via INTRAVENOUS

## 2023-02-21 MED ORDER — ACETAMINOPHEN 325 MG PO TABS: 650.0000 mg | ORAL_TABLET | Freq: Four times a day (QID) | ORAL | Status: DC | PRN

## 2023-02-21 MED ORDER — DULOXETINE HCL 60 MG PO CPEP
60.0000 mg | ORAL_CAPSULE | Freq: Two times a day (BID) | ORAL | Status: DC
  Administered 2023-02-21 – 2023-02-22 (×2): 60 mg via ORAL
  Filled 2023-02-21 (×2): qty 1

## 2023-02-21 MED ORDER — OXYCODONE HCL 5 MG PO TABS: 5.0000 mg | ORAL_TABLET | ORAL | Status: DC | PRN

## 2023-02-21 MED ORDER — ENOXAPARIN SODIUM 40 MG/0.4ML IJ SOSY
40.0000 mg | PREFILLED_SYRINGE | INTRAMUSCULAR | Status: DC
  Administered 2023-02-21: 40 mg via SUBCUTANEOUS
  Filled 2023-02-21: qty 0.4

## 2023-02-21 MED ORDER — SENNOSIDES-DOCUSATE SODIUM 8.6-50 MG PO TABS: 1.0000 | ORAL_TABLET | Freq: Every evening | ORAL | Status: DC | PRN

## 2023-02-21 NOTE — H&P (Signed)
 History and Physical    Theresa Perez FMW:[id number] DOB: 1950-09-16 DOA: 02/21/2023  PCP: Rollene Almarie LABOR, MD   Patient coming from: Home   Chief Complaint: Chest discomfort, cough   HPI: Theresa Perez is a 73 y.o. female with medical history significant for papillary thyroid  carcinoma status-post resection, postoperative hypothyroidism, depression, anxiety, and current smoker who presents with chest discomfort, mild cough, and mild dyspnea.   Symptoms developed over the past 3 or 4 days.  She was concerned that she had developed pneumonia.  She denies fevers or chills.  There is no chest pain currently.  ED Course: Upon arrival to the ED, patient is found to be afebrile and saturating well on room air with normal heart rate and stable blood pressure.  Labs are most notable for potassium 3.3, normal renal function, normal WBC, normal troponin x 2, and negative respiratory virus panel.  Contrast-enhanced chest CT is most remarkable for left upper lobe mass measuring up to 7.1 cm with extension into the hilum, occlusion of the upper lobe bronchus, and known enhancement of the left IJ vein.  Pulmonology was consulted by the ED PA and hospitalists asked to admit.  Review of Systems:  All other systems reviewed and apart from HPI, are negative.  Past Medical History:  Diagnosis Date   Allergic rhinitis 05/19/2007   Qualifier: Diagnosis of   By: Mavis MD, Norleen BRAVO        Allergy    Anxiety    Arthritis    Cancer [facility]) 1997   Thyroid -Papillary   Complication of anesthesia    vomitting only   Constipation 07/13/2014   Cyst of breast, right, diffuse fibrocystic    Depression    Hypertension    Hypothyroidism    Memory change 06/25/2019   PONV (postoperative nausea and vomiting)    Pure hypercholesterolemia 12/08/2013   Smokers' cough (HCC) 12/11/2017    Past Surgical History:  Procedure Laterality Date   ANTERIOR AND POSTERIOR REPAIR N/A 03/31/2012   Procedure:  ANTERIOR (CYSTOCELE) AND POSTERIOR REPAIR (RECTOCELE);  Surgeon: Peggye Gull, MD;  Location: [facility];  Service: Gynecology;  Laterality: N/A;   aspiration cyst right breast     BREAST BIOPSY Left 01/09/2022   MM LT BREAST BX W LOC DEV 1ST LESION IMAGE BX SPEC STEREO GUIDE 01/09/2022 [facility]   THYROIDECTOMY  02/19/1995   with parathyroidectomy   TONSILLECTOMY  02/19/1967   uterine ablation     VAGINAL HYSTERECTOMY N/A 03/31/2012   Procedure: HYSTERECTOMY VAGINAL;  Surgeon: Peggye Gull, MD;  Location: [facility];  Service: Gynecology;  Laterality: N/A;    Social History:   reports that she has been smoking cigarettes. She has a 7.5 pack-year smoking history. She has been exposed to tobacco smoke. She has never used smokeless tobacco. She reports current alcohol use. She reports that she does not use drugs.  Allergies  Allergen Reactions   Permethrin  Rash    Family History  Problem Relation Age of Onset   Diabetes Mother    Deep vein thrombosis Mother        Varicose Veins   Alzheimer's disease Mother    Cancer Father        Father died from brain tumor.   Heart disease Father    Other Brother        Heat stroke   Hypertension Son    Colon cancer Neg Hx    Stomach cancer Neg Hx    Esophageal cancer Neg  Hx    Rectal cancer Neg Hx      Prior to Admission medications   Medication Sig Start Date End Date Taking? Authorizing Provider  Ascorbic Acid (VITAMIN C PO) Take 1 tablet by mouth daily.   Yes [provider]  buPROPion  (WELLBUTRIN  XL) 150 MG 24 hr tablet Take 1 tablet (150 mg total) by mouth daily. 07/29/22  Yes Rollene Almarie LABOR, MD  CALCIUM PO Take 1 tablet by mouth daily.   Yes [provider]  COENZYME Q-10 PO Take 1 tablet by mouth daily.   Yes [provider]  Cyanocobalamin (VITAMIN B-12 PO) Take 1 tablet by mouth daily.   Yes [provider]  DULoxetine  (CYMBALTA ) 60 MG capsule Take 1 capsule (60 mg total) by mouth 2 (two)  times daily. 07/29/22  Yes Rollene Almarie LABOR, MD  ferrous sulfate 325 (65 FE) MG tablet Take 325 mg by mouth every evening.    Yes [provider]  Ginkgo Biloba (GNP GINGKO BILOBA EXTRACT PO) Take 1 tablet by mouth daily.   Yes [provider]  levothyroxine  (SYNTHROID ) 112 MCG tablet Take 1 tablet (112 mcg total) by mouth daily. 08/02/22  Yes Rollene Almarie LABOR, MD  Misc Natural Products (GLUCOSAMINE CHOND COMPLEX/MSM) TABS Take 1 tablet by mouth 2 (two) times daily.   Yes [provider]  Multiple Vitamins-Minerals (MULTIVITAMIN WITH MINERALS) tablet Take 1 tablet by mouth daily.   Yes [provider]  Nutritional Supplements (DHEA PO) Take 1 tablet by mouth daily.   Yes [provider]  omeprazole  (PRILOSEC) 20 MG capsule Take 1 capsule by mouth once daily 12/27/22  Yes Lemmon, Delon Gibson, PA  Turmeric (QC TUMERIC COMPLEX PO) Take 1 tablet by mouth daily.   Yes [provider]  albuterol  (VENTOLIN  HFA) 108 (90 Base) MCG/ACT inhaler Inhale 2 puffs into the lungs every 6 (six) hours as needed for wheezing or shortness of breath. Patient not taking: Reported on 02/21/2023 10/06/22   Ward, Harlene PEDLAR, PA-C  azithromycin  (ZITHROMAX  Z-PAK) 250 MG tablet Take 2 tablets by mouth today and then 1 tablet by mouth the following days. 10/06/22   Ward, Harlene PEDLAR, PA-C  ECHINACEA EXTRACT PO Take 1 tablet by mouth daily. Patient not taking: Reported on 02/21/2023    [provider]    Physical Exam: Vitals:   02/21/23 1900 02/21/23 2004 02/21/23 2017 02/21/23 2108  BP: (!) 169/98 (!) 175/100 (!) 161/89 (!) 171/95  Pulse: 84 88 81 73  Resp: 20 (!) 21 17 18   Temp:  99.4 F (37.4 C)  98.9 F (37.2 C)  TempSrc:  Oral  Oral  SpO2: 97% 96% 96% 94%  Weight:      Height:        Constitutional: NAD, calm  Eyes: PERTLA, lids and conjunctivae normal ENMT: Mucous membranes are moist. Posterior pharynx clear of any exudate or lesions.   Neck:  supple, no masses  Respiratory: no wheezing, no crackles. No accessory muscle use.  Cardiovascular: S1 & S2 heard, regular rate and rhythm. No extremity edema.   Abdomen: No distension, no tenderness, soft. Bowel sounds active.  Musculoskeletal: no clubbing / cyanosis. No joint deformity upper and lower extremities.   Skin: no significant rashes, lesions, ulcers. Warm, dry, well-perfused. Neurologic: CN 2-12 grossly intact. Moving all extremities. Alert and oriented.  Psychiatric: Pleasant. Cooperative.    Labs and Imaging on Admission: I have personally reviewed following labs and imaging studies  CBC: Recent Labs  Lab 02/21/23 1212  WBC 9.4  HGB 14.2  HCT 42.3  MCV 89.4  PLT 366   Basic Metabolic Panel: Recent Labs  Lab 02/21/23 1212  NA 137  K 3.3*  CL 99  CO2 24  GLUCOSE 105*  BUN 8  CREATININE 0.72  CALCIUM 9.6   GFR: Estimated Creatinine Clearance: 75.6 mL/min (by C-G formula based on SCr of 0.72 mg/dL). Liver Function Tests: Recent Labs  Lab 02/21/23 1212  AST 24  ALT 21  ALKPHOS 47  BILITOT 1.0  PROT 6.9  ALBUMIN 3.7   No results for input(s): LIPASE, AMYLASE in the last 168 hours. No results for input(s): AMMONIA in the last 168 hours. Coagulation Profile: No results for input(s): INR, PROTIME in the last 168 hours. Cardiac Enzymes: No results for input(s): CKTOTAL, CKMB, CKMBINDEX, TROPONINI in the last 168 hours. BNP (last 3 results) No results for input(s): PROBNP in the last 8760 hours. HbA1C: No results for input(s): HGBA1C in the last 72 hours. CBG: No results for input(s): GLUCAP in the last 168 hours. Lipid Profile: No results for input(s): CHOL, HDL, LDLCALC, TRIG, CHOLHDL, LDLDIRECT in the last 72 hours. Thyroid  Function Tests: No results for input(s): TSH, T4TOTAL, FREET4, T3FREE, THYROIDAB in the last 72 hours. Anemia Panel: No results for input(s): VITAMINB12, FOLATE, FERRITIN,  TIBC, IRON, RETICCTPCT in the last 72 hours. Urine analysis:    Component Value Date/Time   COLORURINE yellow 09/15/2009 0925   APPEARANCEUR Clear 09/15/2009 0925   LABSPEC 1.025 09/15/2009 0925   PHURINE 5.5 09/15/2009 0925   HGBUR negative 09/15/2009 0925   BILIRUBINUR negative 12/31/2022 1348   BILIRUBINUR negative 02/27/2022 1449   KETONESUR negative 12/31/2022 1348   PROTEINUR negative 12/31/2022 1348   PROTEINUR Negative 02/27/2022 1449   UROBILINOGEN 0.2 12/31/2022 1348   UROBILINOGEN 0.2 09/15/2009 0925   NITRITE Negative 12/31/2022 1348   NITRITE negative 02/27/2022 1449   NITRITE negative 09/15/2009 0925   LEUKOCYTESUR Negative 12/31/2022 1348   Sepsis Labs: @LABRCNTIP (procalcitonin:4,lacticidven:4) ) Recent Results (from the past 240 hours)  Resp panel by RT-PCR (RSV, Flu A&B, Covid) Anterior Nasal Swab     Status: None   Collection Time: 02/21/23  1:52 PM   Specimen: Anterior Nasal Swab  Result Value Ref Range Status   SARS Coronavirus 2 by RT PCR NEGATIVE NEGATIVE Final   Influenza A by PCR NEGATIVE NEGATIVE Final   Influenza B by PCR NEGATIVE NEGATIVE Final    Comment: (NOTE) The Xpert Xpress SARS-CoV-2/FLU/RSV plus assay is intended as an aid in the diagnosis of influenza from Nasopharyngeal swab specimens and should not be used as a sole basis for treatment. Nasal washings and aspirates are unacceptable for Xpert Xpress SARS-CoV-2/FLU/RSV testing.  Fact Sheet for Patients: bloggercourse.com  Fact Sheet for Healthcare Providers: seriousbroker.it  This test is not yet approved or cleared by the [place]  FDA and has been authorized for detection and/or diagnosis of SARS-CoV-2 by FDA under an Emergency Use Authorization (EUA). This EUA will remain in effect (meaning this test can be used) for the duration of the COVID-19 declaration under Section 564(b)(1) of the Act, 21 U.S.C. section  360bbb-3(b)(1), unless the authorization is terminated or revoked.     Resp Syncytial Virus by PCR NEGATIVE NEGATIVE Final    Comment: (NOTE) Fact Sheet for Patients: bloggercourse.com  Fact Sheet for Healthcare Providers: seriousbroker.it  This test is not yet approved or cleared by the [place]  FDA and has been authorized for detection and/or diagnosis  of SARS-CoV-2 by FDA under an Emergency Use Authorization (EUA). This EUA will remain in effect (meaning this test can be used) for the duration of the COVID-19 declaration under Section 564(b)(1) of the Act, 21 U.S.C. section 360bbb-3(b)(1), unless the authorization is terminated or revoked.  Performed at [facility], [address] [place], [place] [address]      Radiological Exams on Admission: CT Chest W Contrast Result Date: 02/21/2023 CLINICAL DATA:  Shortness of breath and chest pain with left lung atelectasis on same day chest radiograph EXAM: CT CHEST WITH CONTRAST TECHNIQUE: Multidetector CT imaging of the chest was performed during intravenous contrast administration. RADIATION DOSE REDUCTION: This exam was performed according to the departmental dose-optimization program which includes automated exposure control, adjustment of the mA and/or kV according to patient size and/or use of iterative reconstruction technique. CONTRAST:  75mL OMNIPAQUE  IOHEXOL  350 MG/ML SOLN COMPARISON:  Same day chest radiograph FINDINGS: Cardiovascular: Normal heart size. No significant pericardial fluid/thickening. Great vessels are normal in course and caliber. No central pulmonary emboli. Coronary artery calcifications and aortic atherosclerosis. Extensive enhancement of subcutaneous collateral vessels. Nonenhancement of the left internal jugular vein. Mediastinum/Nodes: Leftward shift of the mediastinum. Imaged thyroid  gland without nodules meeting criteria for imaging follow-up by size.  Small hiatal hernia. No pathologically enlarged axillary, supraclavicular, mediastinal, or hilar lymph nodes. Lungs/Pleura: There is marked effacement of the left upper lobe bronchus and abrupt cut off of the left lower lobe bronchus. The central airways are otherwise patent. Near-complete atelectasis of the left upper lobe with subsegmental atelectasis of the lingula. Within this area of left upper lobe atelectasis, there is masslike hypoenhancement measuring 7.1 x 2.8 cm (3:44) which demonstrates extension to the hilum (3:59). Minimal linear atelectasis at the left lower lobe base. Scattered linear atelectasis in the right middle and lower lobes. Right apical pleural-parenchymal scarring. No pneumothorax. Small left pleural effusion. Upper abdomen: Normal. Musculoskeletal: No acute or abnormal lytic or blastic osseous lesions. Multilevel degenerative changes of the thoracic spine. Rounded density in the upper inner right breast measures 1.4 x 1.1 cm (3:81). IMPRESSION: 1. Marked effacement of the left upper lobe bronchus and abrupt cut off of the left lower lobe bronchus with near-complete atelectasis of the left upper lobe and subsegmental atelectasis of the lingula. Within this area of left upper lobe atelectasis, there is masslike hypoenhancement measuring 7.1 x 2.8 cm which demonstrates extension to the hilum. Findings are suspicious for primary lung malignancy, less likely superimposed pneumonia. 2. Small left pleural effusion. 3. Nonenhancement of the left internal jugular vein with extensive enhancement of subcutaneous collateral vessels. Recommend correlation with Doppler ultrasound examination of the left internal jugular vein. 4. Rounded density in the upper inner right breast measures 1.4 x 1.1 cm. Recommend correlation with dedicated breast imaging, if this finding has not been previously evaluated. 5. Aortic Atherosclerosis (ICD10-I70.0). Coronary artery calcifications. Assessment for potential risk  factor modification, dietary therapy or pharmacologic therapy may be warranted, if clinically indicated. Electronically Signed   By: Limin  Xu M.D.   On: 02/21/2023 16:51   DG Chest 2 View Addendum Date: 02/21/2023 ADDENDUM REPORT: 02/21/2023 14:28 ADDENDUM: Case discussed with Dorn Davis's clinical service at 2:25 p.m. Electronically Signed   By: Rockey Kilts M.D.   On: 02/21/2023 14:28   Result Date: 02/21/2023 CLINICAL DATA:  Shortness of breath and chest pain EXAM: CHEST - 2 VIEW COMPARISON:  10/06/2022 FINDINGS: Surgical clips in the low neck. Midline trachea. Normal heart size. No  pleural effusion or pneumothorax. Clear right lung. Soft tissue fullness in the left suprahilar region with increased anterior density on the lateral view. Volume loss throughout the left hemithorax with new left hemidiaphragm elevation. IMPRESSION: Abnormal appearance of the left hemithorax, suspicious for central left upper lobe/suprahilar lung mass with secondary partial collapse and hemidiaphragm elevation. Recommend further evaluation with contrast enhanced chest CT. A call to the emergency room physician is pending. Electronically Signed: By: Rockey Kilts M.D. On: 02/21/2023 14:17    EKG: Independently reviewed. Sinus rhythm.   Assessment/Plan   1. Left lung mass  - Appreciate pulmonology consultation, they are arranging for outpatient bronchoscopy for biopsy    2. ?Left internal jugular occlusion  - Check Doppler   3. Hypokalemia  - Replacing    4. Hypothyroidism  - Synthroid     5. Depression   - Wellbutrin , Cymbalta   6. Right breast lesion  - Rounded density noted on in upper inner right breast on CT in ED  - Outpatient follow-up advised     DVT prophylaxis: Lovenox   Code Status: Full  Level of Care: Level of care: Telemetry Medical Family Communication: none present Disposition Plan:  Patient is from: home  Anticipated d/c is to: Home  Anticipated d/c date is: 1/4 or 02/23/23  Patient  currently: Pending [facility] imaging, disposition planning  Consults called: Pulmonology  Admission status: Observation     Theresa GORMAN Sprinkles, MD [facility]  02/21/2023, 10:40 PM

## 2023-02-21 NOTE — Plan of Care (Signed)

## 2023-02-21 NOTE — ED Notes (Signed)
 Patient is being discharged from the Urgent Care and sent to the Emergency Department via Ambulance (Called at 1051 am) . Per Provider GENELL Billy RIGGERS), patient is in need of higher level of care due to Chest Pain, SOB, EKG Changes. Patient is aware and verbalizes understanding of plan of care.  Vitals:   02/21/23 1029 02/21/23 1036  BP: (!) 155/96 (!) 154/98  Pulse: 82   Resp: (!) 22 20  Temp: 98.7 F (37.1 C)   SpO2: 94% 95%

## 2023-02-21 NOTE — ED Provider Notes (Signed)
 [facility]    CSN: [id number] Arrival date & time: 02/21/23  1012      History   Chief Complaint Chief Complaint  Patient presents with   Wheezing   Cough   Chest Pain    HPI Theresa Perez is a 73 y.o. female.   Patient presents to urgent care with left sided chest discomfort/ pressure and shortness of breath. She notes that she has had some cough and congestion for a few days. Chest pain and pressure was worse yesterday- resolved once she rested and was comfortable- began again today after she was up and about. She reports some lightheadedness at times but no diaphoresis. She has not had any nausea or vomiting.   The history is provided by the patient.  Wheezing Associated symptoms: chest pain, chest tightness, cough and shortness of breath   Associated symptoms: no fever   Cough Associated symptoms: chest pain, shortness of breath and wheezing   Associated symptoms: no chills, no eye discharge and no fever   Chest Pain Associated symptoms: cough and shortness of breath   Associated symptoms: no abdominal pain, no fever, no nausea and no vomiting     Past Medical History:  Diagnosis Date   Allergic rhinitis 05/19/2007   Qualifier: Diagnosis of   By: Mavis MD, Norleen BRAVO        Allergy    Anxiety    Arthritis    Cancer [facility] 1997   Thyroid -Papillary   Complication of anesthesia    vomitting only   Constipation 07/13/2014   Cyst of breast, right, diffuse fibrocystic    Depression    Hypertension    Hypothyroidism    Memory change 06/25/2019   PONV (postoperative nausea and vomiting)    Pure hypercholesterolemia 12/08/2013   Smokers' cough (HCC) 12/11/2017    Patient Active Problem List   Diagnosis Date Noted   Toe pain, right 01/25/2022   Bilateral primary osteoarthritis of knee 06/14/2021   Adjustment disorder with mixed anxiety and depressed mood 08/02/2019   De Quervain's disease (radial styloid tenosynovitis) 04/01/2017   Dysphagia  12/06/2016   Routine general medical examination at a health care facility 12/16/2014   Papillary thyroid  carcinoma (HCC) 03/23/2014   Varicose veins of bilateral lower extremities with other complications 12/09/2011   Essential hypertension 05/19/2007    Past Surgical History:  Procedure Laterality Date   ANTERIOR AND POSTERIOR REPAIR N/A 03/31/2012   Procedure: ANTERIOR (CYSTOCELE) AND POSTERIOR REPAIR (RECTOCELE);  Surgeon: Peggye Gull, MD;  Location: [facility];  Service: Gynecology;  Laterality: N/A;   aspiration cyst right breast     BREAST BIOPSY Left 01/09/2022   MM LT BREAST BX W LOC DEV 1ST LESION IMAGE BX SPEC STEREO GUIDE 01/09/2022 [facility]   THYROIDECTOMY  02/19/1995   with parathyroidectomy   TONSILLECTOMY  02/19/1967   uterine ablation     VAGINAL HYSTERECTOMY N/A 03/31/2012   Procedure: HYSTERECTOMY VAGINAL;  Surgeon: Peggye Gull, MD;  Location: [facility];  Service: Gynecology;  Laterality: N/A;    OB History   No obstetric history on file.      Home Medications    Prior to Admission medications   Medication Sig Start Date End Date Taking? Authorizing Provider  albuterol  (VENTOLIN  HFA) 108 (90 Base) MCG/ACT inhaler Inhale 2 puffs into the lungs every 6 (six) hours as needed for wheezing or shortness of breath. 10/06/22   Ward, Jessica Z, PA-C  Ascorbic Acid (VITAMIN C PO) Take 1 tablet  by mouth daily.    [provider]  atenolol -chlorthalidone  (TENORETIC ) 50-25 MG tablet Take 0.5 tablets by mouth daily. 07/29/22   Rollene Almarie LABOR, MD  azithromycin  (ZITHROMAX  Z-PAK) 250 MG tablet Take 2 tablets by mouth today and then 1 tablet by mouth the following days. 10/06/22   Ward, Harlene PEDLAR, PA-C  benzonatate  (TESSALON ) 100 MG capsule Take 1 capsule (100 mg total) by mouth every 8 (eight) hours. 10/06/22   Ward, Harlene PEDLAR, PA-C  Black Cohosh 540 MG CAPS Take 540 mg by mouth 2 (two) times daily.     [provider]  buPROPion  (WELLBUTRIN  XL) 150 MG 24  hr tablet Take 1 tablet (150 mg total) by mouth daily. 07/29/22   Rollene Almarie LABOR, MD  butalbital -acetaminophen -caffeine  (ESGIC) 50-325-40 MG tablet Take 1 tablet by mouth 2 (two) times daily as needed for headache. 01/07/20   Jason Leita Repine, FNP  CALCIUM PO Take 1 tablet by mouth daily.    [provider]  COENZYME Q-10 PO Take 1 tablet by mouth daily.    [provider]  Cyanocobalamin (VITAMIN B-12 PO) Take 1 tablet by mouth daily.    [provider]  DULoxetine  (CYMBALTA ) 60 MG capsule Take 1 capsule (60 mg total) by mouth 2 (two) times daily. 07/29/22   Rollene Almarie LABOR, MD  ECHINACEA EXTRACT PO Take by mouth.    [provider]  EVENING PRIMROSE OIL PO Take by mouth.    [provider]  ferrous sulfate 325 (65 FE) MG tablet Take 325 mg by mouth every evening.     [provider]  fluticasone  (FLONASE ) 50 MCG/ACT nasal spray Use 2 spray in each nostril once daily 07/29/22   Rollene Almarie LABOR, MD  Ginkgo Biloba (GNP GINGKO BILOBA EXTRACT PO) Take 1 tablet by mouth daily.    [provider]  levothyroxine  (SYNTHROID ) 112 MCG tablet Take 1 tablet (112 mcg total) by mouth daily. 08/02/22   Rollene Almarie LABOR, MD  LORazepam  (ATIVAN ) 0.5 MG tablet Take 1 tablet (0.5 mg total) by mouth daily as needed for anxiety. 07/29/22   Rollene Almarie LABOR, MD  Misc Natural Products (GLUCOSAMINE CHOND COMPLEX/MSM) TABS Take 1 tablet by mouth 2 (two) times daily.    [provider]  Multiple Vitamins-Minerals (MULTIVITAMIN WITH MINERALS) tablet Take 1 tablet by mouth daily.    [provider]  Nutritional Supplements (DHEA PO) Take 1 tablet by mouth daily.    [provider]  omeprazole  (PRILOSEC) 20 MG capsule Take 1 capsule by mouth once daily 12/27/22   Beather Delon Gibson, PA  triamcinolone  cream (KENALOG ) 0.1 % Apply 1 Application topically 2 (two) times daily. 12/20/21   McElwee, Tinnie LABOR, NP   Turmeric (QC TUMERIC COMPLEX PO) Take by mouth.    [provider]    Family History Family History  Problem Relation Age of Onset   Diabetes Mother    Deep vein thrombosis Mother        Varicose Veins   Alzheimer's disease Mother    Cancer Father        Father died from brain tumor.   Heart disease Father    Other Brother        Heat stroke   Hypertension Son    Colon cancer Neg Hx    Stomach cancer Neg Hx    Esophageal cancer Neg Hx    Rectal cancer Neg Hx     Social History Social History  Tobacco Use   Smoking status: Every Day    Current packs/day: 0.50    Average packs/day: 0.5 packs/day for 15.0 years (7.5 ttl pk-yrs)    Types: Cigarettes    Passive exposure: Past   Smokeless tobacco: Never   Tobacco comments:    Occassional smoker  Vaping Use   Vaping status: Never Used  Substance Use Topics   Alcohol use: Yes    Comment: occ wine   Drug use: No     Allergies   Permethrin    Review of Systems Review of Systems  Constitutional:  Negative for chills and fever.  Eyes:  Negative for discharge and redness.  Respiratory:  Positive for cough, chest tightness, shortness of breath and wheezing.   Cardiovascular:  Positive for chest pain.  Gastrointestinal:  Negative for abdominal pain, nausea and vomiting.  Neurological:  Positive for light-headedness.     Physical Exam Triage Vital Signs ED Triage Vitals  Encounter Vitals Group     BP 02/21/23 1029 (!) 155/96     Systolic BP Percentile --      Diastolic BP Percentile --      Pulse Rate 02/21/23 1029 82     Resp 02/21/23 1029 (!) 22     Temp 02/21/23 1029 98.7 F (37.1 C)     Temp Source 02/21/23 1029 Oral     SpO2 02/21/23 1029 94 %     Weight 02/21/23 1028 190 lb 0.6 oz (86.2 kg)     Height 02/21/23 1028 5' 9 (1.753 m)     Head Circumference --      Peak Flow --      Pain Score 02/21/23 1027 4     Pain Loc --      Pain Education --      Exclude from Growth Chart --    No  data found.  Updated Vital Signs BP (!) 154/98 (BP Location: Left Arm)   Pulse 82   Temp 98.7 F (37.1 C) (Oral)   Resp 20   Ht 5' 9 (1.753 m)   Wt 190 lb 0.6 oz (86.2 kg)   SpO2 95%   BMI 28.06 kg/m   Visual Acuity Right Eye Distance:   Left Eye Distance:   Bilateral Distance:    Right Eye Near:   Left Eye Near:    Bilateral Near:     Physical Exam Vitals and nursing note reviewed.  Constitutional:      General: She is not in acute distress.    Appearance: Normal appearance. She is not ill-appearing.  HENT:     Head: Normocephalic and atraumatic.     Nose: Nose normal. No congestion.  Eyes:     Conjunctiva/sclera: Conjunctivae normal.  Cardiovascular:     Rate and Rhythm: Normal rate and regular rhythm.  Pulmonary:     Effort: Pulmonary effort is normal. No respiratory distress.     Breath sounds: No wheezing, rhonchi or rales.  Neurological:     Mental Status: She is alert.  Psychiatric:        Mood and Affect: Mood normal.        Behavior: Behavior normal.        Thought Content: Thought content normal.      UC Treatments / Results  Labs (all labs ordered are listed, but only abnormal results are displayed) Labs Reviewed - No data to display  EKG   Radiology No results found.  Procedures Procedures (including critical care time)  Medications  Ordered in UC Medications - No data to display  Initial Impression / Assessment and Plan / UC Course  I have reviewed the triage vital signs and the nursing notes.  Pertinent labs & imaging results that were available during my care of the patient were reviewed by me and considered in my medical decision making (see chart for details).    Only EKG to compare today's with was from 10 years prior but there are some changes noted-- recommended evaluation in the ED with EMS transport given inability to rule out cardiac etiology. Patient is agreeable to same.   Final Clinical Impressions(s) / UC Diagnoses    Final diagnoses:  Shortness of breath  Left-sided chest pain   Discharge Instructions   None    ED Prescriptions   None    PDMP not reviewed this encounter.   Billy Asberry FALCON, PA-C 02/21/23 1127

## 2023-02-21 NOTE — ED Triage Notes (Signed)
"  This started with wheezing/sob last night". "Also having pain/heaviness/pressure in left chest with sometimes being pain". No previous or current cold symptoms. No fever.

## 2023-02-21 NOTE — ED Provider Notes (Signed)
 [place] [facility] Provider Note   CSN: [id number] Arrival date & time: 02/21/23  1202     History  Chief Complaint  Patient presents with   Chest Pain   HPI LARRISA CRAVEY is a 73 y.o. female with history of hypertension, thyroid  carcinoma presenting for chest pain. States has been going on for 3 days.  Pain is located all along the left chest.  It is nonradiating.  Improved with sitting down.  Not necessarily worse with exertion. Also states she has had a cough and associated shortness of breath as well as wheezing.  Was seen earlier today by urgent care and was noted to have EKG changes in comparison to EKG in 2015.  She states that this time she is mildly short of breath but no chest discomfort or pain.   Chest Pain      Home Medications Prior to Admission medications   Medication Sig Start Date End Date Taking? Authorizing Provider  albuterol  (VENTOLIN  HFA) 108 (90 Base) MCG/ACT inhaler Inhale 2 puffs into the lungs every 6 (six) hours as needed for wheezing or shortness of breath. 10/06/22   Ward, Jessica Z, PA-C  Ascorbic Acid (VITAMIN C PO) Take 1 tablet by mouth daily.    [provider]  atenolol -chlorthalidone  (TENORETIC ) 50-25 MG tablet Take 0.5 tablets by mouth daily. 07/29/22   Rollene Almarie LABOR, MD  azithromycin  (ZITHROMAX  Z-PAK) 250 MG tablet Take 2 tablets by mouth today and then 1 tablet by mouth the following days. 10/06/22   Ward, Jessica Z, PA-C  benzonatate  (TESSALON ) 100 MG capsule Take 1 capsule (100 mg total) by mouth every 8 (eight) hours. 10/06/22   Ward, Harlene PEDLAR, PA-C  Black Cohosh 540 MG CAPS Take 540 mg by mouth 2 (two) times daily.     [provider]  buPROPion  (WELLBUTRIN  XL) 150 MG 24 hr tablet Take 1 tablet (150 mg total) by mouth daily. 07/29/22   Rollene Almarie LABOR, MD  butalbital -acetaminophen -caffeine  (ESGIC) 50-325-40 MG tablet Take 1 tablet by mouth 2 (two) times daily as needed for  headache. 01/07/20   Jason Leita Repine, FNP  CALCIUM PO Take 1 tablet by mouth daily.    [provider]  COENZYME Q-10 PO Take 1 tablet by mouth daily.    [provider]  Cyanocobalamin (VITAMIN B-12 PO) Take 1 tablet by mouth daily.    [provider]  DULoxetine  (CYMBALTA ) 60 MG capsule Take 1 capsule (60 mg total) by mouth 2 (two) times daily. 07/29/22   Rollene Almarie LABOR, MD  ECHINACEA EXTRACT PO Take by mouth.    [provider]  EVENING PRIMROSE OIL PO Take by mouth.    [provider]  ferrous sulfate 325 (65 FE) MG tablet Take 325 mg by mouth every evening.     [provider]  fluticasone  (FLONASE ) 50 MCG/ACT nasal spray Use 2 spray in each nostril once daily 07/29/22   Rollene Almarie LABOR, MD  Ginkgo Biloba (GNP GINGKO BILOBA EXTRACT PO) Take 1 tablet by mouth daily.    [provider]  levothyroxine  (SYNTHROID ) 112 MCG tablet Take 1 tablet (112 mcg total) by mouth daily. 08/02/22   Rollene Almarie LABOR, MD  LORazepam  (ATIVAN ) 0.5 MG tablet Take 1 tablet (0.5 mg total) by mouth daily as needed for anxiety. 07/29/22   Rollene Almarie LABOR, MD  Misc Natural Products (GLUCOSAMINE CHOND COMPLEX/MSM) TABS Take 1 tablet by mouth 2 (two) times daily.  [provider]  Multiple Vitamins-Minerals (MULTIVITAMIN WITH MINERALS) tablet Take 1 tablet by mouth daily.    [provider]  Nutritional Supplements (DHEA PO) Take 1 tablet by mouth daily.    [provider]  omeprazole  (PRILOSEC) 20 MG capsule Take 1 capsule by mouth once daily 12/27/22   Beather Delon Gibson, PA  triamcinolone  cream (KENALOG ) 0.1 % Apply 1 Application topically 2 (two) times daily. 12/20/21   McElwee, Tinnie LABOR, NP  Turmeric (QC TUMERIC COMPLEX PO) Take by mouth.    [provider]      Allergies    Permethrin     Review of Systems   Review of Systems  Cardiovascular:  Positive for chest pain.    Physical  Exam Updated Vital Signs BP (!) 169/98   Pulse 84   Temp 98.6 F (37 C) (Oral)   Resp 20   Ht 5' 9 (1.753 m)   Wt 86.2 kg   SpO2 97%   BMI 28.06 kg/m  Physical Exam Vitals and nursing note reviewed.  HENT:     Head: Normocephalic and atraumatic.     Mouth/Throat:     Mouth: Mucous membranes are moist.  Eyes:     General:        Right eye: No discharge.        Left eye: No discharge.     Conjunctiva/sclera: Conjunctivae normal.  Cardiovascular:     Rate and Rhythm: Normal rate and regular rhythm.     Pulses: Normal pulses.     Heart sounds: Normal heart sounds.  Pulmonary:     Effort: Pulmonary effort is normal.     Breath sounds: Normal breath sounds. No wheezing, rhonchi or rales.  Abdominal:     General: Abdomen is flat.     Palpations: Abdomen is soft.  Skin:    General: Skin is warm and dry.  Neurological:     General: No focal deficit present.  Psychiatric:        Mood and Affect: Mood normal.     ED Results / Procedures / Treatments   Labs (all labs ordered are listed, but only abnormal results are displayed) Labs Reviewed  COMPREHENSIVE METABOLIC PANEL - Abnormal; Notable for the following components:      Result Value   Potassium 3.3 (*)    Glucose, Bld 105 (*)    All other components within normal limits  RESP PANEL BY RT-PCR (RSV, FLU A&B, COVID)  RVPGX2  CBC  TROPONIN I (HIGH SENSITIVITY)  TROPONIN I (HIGH SENSITIVITY)    EKG None  Radiology CT Chest W Contrast Result Date: 02/21/2023 CLINICAL DATA:  Shortness of breath and chest pain with left lung atelectasis on same day chest radiograph EXAM: CT CHEST WITH CONTRAST TECHNIQUE: Multidetector CT imaging of the chest was performed during intravenous contrast administration. RADIATION DOSE REDUCTION: This exam was performed according to the departmental dose-optimization program which includes automated exposure control, adjustment of the mA and/or kV according to patient size and/or use of  iterative reconstruction technique. CONTRAST:  75mL OMNIPAQUE  IOHEXOL  350 MG/ML SOLN COMPARISON:  Same day chest radiograph FINDINGS: Cardiovascular: Normal heart size. No significant pericardial fluid/thickening. Great vessels are normal in course and caliber. No central pulmonary emboli. Coronary artery calcifications and aortic atherosclerosis. Extensive enhancement of subcutaneous collateral vessels. Nonenhancement of the left internal jugular vein. Mediastinum/Nodes: Leftward shift of the mediastinum. Imaged thyroid  gland without nodules meeting criteria for imaging follow-up by size. Small hiatal hernia. No pathologically enlarged  axillary, supraclavicular, mediastinal, or hilar lymph nodes. Lungs/Pleura: There is marked effacement of the left upper lobe bronchus and abrupt cut off of the left lower lobe bronchus. The central airways are otherwise patent. Near-complete atelectasis of the left upper lobe with subsegmental atelectasis of the lingula. Within this area of left upper lobe atelectasis, there is masslike hypoenhancement measuring 7.1 x 2.8 cm (3:44) which demonstrates extension to the hilum (3:59). Minimal linear atelectasis at the left lower lobe base. Scattered linear atelectasis in the right middle and lower lobes. Right apical pleural-parenchymal scarring. No pneumothorax. Small left pleural effusion. Upper abdomen: Normal. Musculoskeletal: No acute or abnormal lytic or blastic osseous lesions. Multilevel degenerative changes of the thoracic spine. Rounded density in the upper inner right breast measures 1.4 x 1.1 cm (3:81). IMPRESSION: 1. Marked effacement of the left upper lobe bronchus and abrupt cut off of the left lower lobe bronchus with near-complete atelectasis of the left upper lobe and subsegmental atelectasis of the lingula. Within this area of left upper lobe atelectasis, there is masslike hypoenhancement measuring 7.1 x 2.8 cm which demonstrates extension to the hilum. Findings are  suspicious for primary lung malignancy, less likely superimposed pneumonia. 2. Small left pleural effusion. 3. Nonenhancement of the left internal jugular vein with extensive enhancement of subcutaneous collateral vessels. Recommend correlation with Doppler ultrasound examination of the left internal jugular vein. 4. Rounded density in the upper inner right breast measures 1.4 x 1.1 cm. Recommend correlation with dedicated breast imaging, if this finding has not been previously evaluated. 5. Aortic Atherosclerosis (ICD10-I70.0). Coronary artery calcifications. Assessment for potential risk factor modification, dietary therapy or pharmacologic therapy may be warranted, if clinically indicated. Electronically Signed   By: Limin  Xu M.D.   On: 02/21/2023 16:51   DG Chest 2 View Addendum Date: 02/21/2023 ADDENDUM REPORT: 02/21/2023 14:28 ADDENDUM: Case discussed with Dorn Davis's clinical service at 2:25 p.m. Electronically Signed   By: Rockey Kilts M.D.   On: 02/21/2023 14:28   Result Date: 02/21/2023 CLINICAL DATA:  Shortness of breath and chest pain EXAM: CHEST - 2 VIEW COMPARISON:  10/06/2022 FINDINGS: Surgical clips in the low neck. Midline trachea. Normal heart size. No pleural effusion or pneumothorax. Clear right lung. Soft tissue fullness in the left suprahilar region with increased anterior density on the lateral view. Volume loss throughout the left hemithorax with new left hemidiaphragm elevation. IMPRESSION: Abnormal appearance of the left hemithorax, suspicious for central left upper lobe/suprahilar lung mass with secondary partial collapse and hemidiaphragm elevation. Recommend further evaluation with contrast enhanced chest CT. A call to the emergency room physician is pending. Electronically Signed: By: Rockey Kilts M.D. On: 02/21/2023 14:17    Procedures Procedures    Medications Ordered in ED Medications  iohexol  (OMNIPAQUE ) 350 MG/ML injection 75 mL (75 mLs Intravenous Contrast Given  02/21/23 1502)    ED Course/ Medical Decision Making/ A&P Clinical Course as of 02/21/23 1958  Fri Feb 21, 2023  1422 Radiology: C/f mass on XR, recommend CT [JD]  1909 Discussed CT chest with Dr. Oneta of radiology who mentioned that she cannot definitively rule out an acute thrombosis in the left internal jugular vein.  Recommending further evaluation with ultrasound. [JR]  1917 CT Chest W Contrast [JR]  1927 BMI (Calculated): 28.05 [JR]  1946 Discussed patient with Dr. Kara of pulmonology who had no immediate recommendations at this time but stated that he would evaluate her in person. Admitted to hospital service with Dr. Velinda Sprinkles. [JR]  Clinical Course User Index [JD] Davis, Jonathon H, MD [JR] Lang Norleen POUR, PA-C                                 Medical Decision Making Amount and/or Complexity of Data Reviewed Labs: ordered. Radiology: ordered.   Initial Impression and Ddx 73 year old well-appearing female presenting for chest pain.  Exam was unremarkable.  DDx includes PE, ACS, pneumonia, pneumothorax, other. Patient PMH that increases complexity of ED encounter:  hypertension, thyroid  carcinoma  Interpretation of Diagnostics - I independent reviewed and interpreted the labs as followed: Mildly hypokalemic  - I independently visualized the following imaging with scope of interpretation limited to determining acute life threatening conditions related to emergency care: Chest CT, which revealed 1. Marked effacement of the left upper lobe bronchus and abrupt cut off of the left lower lobe bronchus with near-complete atelectasis of the left upper lobe and subsegmental atelectasis of the lingula. Within this area of left upper lobe atelectasis, there is masslike hypoenhancement measuring 7.1 x 2.8 cm which demonstrates extension to the hilum. Findings are suspicious for primary lung malignancy, less likely superimposed pneumonia. 2. Small left pleural effusion. 3.  Nonenhancement of the left internal jugular vein with extensive enhancement of subcutaneous collateral vessels. Recommend correlation with Doppler ultrasound examination of the left internal jugular vein. 4. Rounded density in the upper inner right breast measures 1.4 x 1.1 cm. Recommend correlation with dedicated breast imaging, if this finding has not been previously evaluated.   -I personally reviewed and interpreted EKG which revealed normal sinus rhythm  Patient Reassessment and Ultimate Disposition/Management Workup suggestive of what appears to be a large mass in the left upper lobe concerning for primary lung malignancy.  Also questionable vascular pathology in the left internal jugular vein.  I discussed these findings with the patient.  She was understandably devastated by the news.  Discussed patient with pulmonology who advised that they would see her in person.  Admitted to hospital service with Dr. Evalene Sprinkles for new lung mass and questionable vascular pathology in the left internal jugular vein which will need further evaluation with ultrasound per radiology's recommendation. At this time, patient is in no acute distress, hemodynamically stable and well-appearing.  Patient management required discussion with the following services or consulting groups:  Hospitalist Service, pulmonology and radiology  Complexity of Problems Addressed Acute complicated illness or Injury  Additional Data Reviewed and Analyzed Further history obtained from: Past medical history and medications listed in the EMR and Prior ED visit notes  Patient Encounter Risk Assessment Consideration of hospitalization         Final Clinical Impression(s) / ED Diagnoses Final diagnoses:  Lung mass    Rx / DC Orders ED Discharge Orders     None         Lang Norleen POUR, PA-C 02/21/23 1958    Bari Charmaine FALCON, MD 02/25/23 2253

## 2023-02-21 NOTE — ED Provider Triage Note (Signed)
 Emergency Medicine Provider Triage Evaluation Note  Theresa Perez , a 73 y.o. female  was evaluated in triage.  Pt complains of chest pain.  Review of Systems  Positive: Pain, congestion, mild SOB Negative: Vomiting, history of CAD  Physical Exam  BP (!) 136/93 (BP Location: Right Arm)   Pulse 80   Temp 99.1 F (37.3 C) (Oral)   Resp 15   Ht 5' 9 (1.753 m)   Wt 86.2 kg   SpO2 96%   BMI 28.06 kg/m  Gen:   Awake, no distress   Resp:  Normal effort CTA MSK:   Moves extremities without difficulty  Other:  RRR, chest mildly tender to palpation  Medical Decision Making  Medically screening exam initiated at 1:50 PM.  Appropriate orders placed.  Theresa Perez was informed that the remainder of the evaluation will be completed by another provider, this initial triage assessment does not replace that evaluation, and the importance of remaining in the ED until their evaluation is complete.  Chest pain that started earlier today, comes and goes. She has developed some congestion today as well. No sick contacts, no fever, no nausea.    Odell Balls, PA-C 02/21/23 1351

## 2023-02-21 NOTE — Patient Instructions (Addendum)
 Thank you for coming in today.  You received an injection today. Seek immediate medical attention if the joint becomes red, extremely painful, or is oozing fluid.  Continue Voltaren Gel  Follow-up as needed

## 2023-02-21 NOTE — Consult Note (Signed)
 NAME:  Theresa Perez, MRN:  [id number], DOB:  11/26/1950, LOS: 0 ADMISSION DATE:  02/21/2023, CONSULTATION DATE:  02/21/23 REFERRING MD: Norleen Essex, PA  CHIEF COMPLAINT:  Lung Mass   History of Present Illness:  Theresa Perez is a 73 year old woman, daily smoker with history of thyroid  papillary carcinoma, hypertension and anxiety/depression who presented to the ER today with shortness of breath and left side chest pain.   Chest radiograph today showed concern for left upper lob/suprahilar lung mass with secondary collapse of lung. CT Chest with contrast done which shows LUL mass 7.1 x 2.8cm with extension into the hilum. Occlusion of the left upper lobe bronchus with near complete atelectasis of LUL. Small left pleural effusion. Concern for non-enhancement of the left internal jugular vein with formation of collaterals concerning for occlusion or blood clots.   She smokes a couple of cigarettes per day and is around second hand smoke from her husband at times for 30+ years. She is retired, no history of chemical or dust exposures. No hemoptysis. No weight loss, fevers, chills or sweats.  Pertinent  Medical History   Past Medical History:  Diagnosis Date   Allergic rhinitis 05/19/2007   Qualifier: Diagnosis of   By: Mavis MD, Norleen BRAVO        Allergy    Anxiety    Arthritis    Cancer [facility]) 1997   Thyroid -Papillary   Complication of anesthesia    vomitting only   Constipation 07/13/2014   Cyst of breast, right, diffuse fibrocystic    Depression    Hypertension    Hypothyroidism    Memory change 06/25/2019   PONV (postoperative nausea and vomiting)    Pure hypercholesterolemia 12/08/2013   Smokers' cough (HCC) 12/11/2017     Significant Hospital Events: Including procedures, antibiotic start and stop dates in addition to other pertinent events   1/3 admitted to [facility], PCCM consulted for lung mass  Interim History / Subjective:  As above  Objective   Blood pressure (!)  169/98, pulse 84, temperature 98.6 F (37 C), temperature source Oral, resp. rate 20, height 5' 9 (1.753 m), weight 86.2 kg, SpO2 97%.       No intake or output data in the 24 hours ending 02/21/23 1945 Filed Weights   02/21/23 1206  Weight: 86.2 kg   Examination: General: elderly woman, no distress HENT: [place]/AT, moist mucous membranes Lungs: clear to auscultation, no wheezing Cardiovascular: rrr, no murmurs Abdomen: soft, non-tender, non-distended, BS+ Extremities: warm, no edema Neuro: alert, oriented, moving all extremities GU: n/a  Resolved Hospital Problem list     Assessment & Plan:  Left Upper Lobe Lung Mass Possible Left internal jugular vein occlusion Hypothyroidism Cigarette Smoker  Plan: - she will require bronchoscopy for biopsies of the left lung mass, this will be setup as outpatient - await further workup of left internal jugular occlusion with US  and possible CT neck with contrast.  - PRN albuterol  - Low concern for post-obstructive pneumonia at this time, ok to hold off on antibiotics.  PCCM will continue to follow to arrange bronchoscopy hopefully this coming week.  Best Practice (right click and Reselect all SmartList Selections daily)   Per primary  Labs   CBC: Recent Labs  Lab 02/21/23 1212  WBC 9.4  HGB 14.2  HCT 42.3  MCV 89.4  PLT 366    Basic Metabolic Panel: Recent Labs  Lab 02/21/23 1212  NA 137  K 3.3*  CL 99  CO2 24  GLUCOSE 105*  BUN 8  CREATININE 0.72  CALCIUM 9.6   GFR: Estimated Creatinine Clearance: 75.6 mL/min (by C-G formula based on SCr of 0.72 mg/dL). Recent Labs  Lab 02/21/23 1212  WBC 9.4    Liver Function Tests: Recent Labs  Lab 02/21/23 1212  AST 24  ALT 21  ALKPHOS 47  BILITOT 1.0  PROT 6.9  ALBUMIN 3.7   No results for input(s): LIPASE, AMYLASE in the last 168 hours. No results for input(s): AMMONIA in the last 168 hours.  ABG No results found for: PHART, PCO2ART, PO2ART,  HCO3, TCO2, ACIDBASEDEF, O2SAT   Coagulation Profile: No results for input(s): INR, PROTIME in the last 168 hours.  Cardiac Enzymes: No results for input(s): CKTOTAL, CKMB, CKMBINDEX, TROPONINI in the last 168 hours.  HbA1C: Hgb A1c MFr Bld  Date/Time Value Ref Range Status  05/08/2020 11:53 AM 5.6 4.6 - 6.5 % Final    Comment:    Glycemic Control Guidelines for People with Diabetes:Non Diabetic:  <6%Goal of Therapy: <7%Additional Action Suggested:  >8%     CBG: No results for input(s): GLUCAP in the last 168 hours.  Review of Systems:   Review of Systems  Constitutional:  Negative for chills, fever, malaise/fatigue and weight loss.  HENT:  Negative for congestion, sinus pain and sore throat.   Eyes: Negative.   Respiratory:  Positive for cough and shortness of breath. Negative for hemoptysis, sputum production and wheezing.   Cardiovascular:  Positive for chest pain. Negative for palpitations, orthopnea, claudication and leg swelling.  Gastrointestinal:  Negative for abdominal pain, heartburn, nausea and vomiting.  Genitourinary: Negative.   Musculoskeletal:  Negative for joint pain and myalgias.  Skin:  Negative for rash.  Neurological:  Negative for weakness.  Endo/Heme/Allergies: Negative.   Psychiatric/Behavioral: Negative.       Past Medical History:  She,  has a past medical history of Allergic rhinitis (05/19/2007), Allergy, Anxiety, Arthritis, Cancer (HCC) (1997), Complication of anesthesia, Constipation (07/13/2014), Cyst of breast, right, diffuse fibrocystic, Depression, Hypertension, Hypothyroidism, Memory change (06/25/2019), PONV (postoperative nausea and vomiting), Pure hypercholesterolemia (12/08/2013), and Smokers' cough (HCC) (12/11/2017).   Surgical History:   Past Surgical History:  Procedure Laterality Date   ANTERIOR AND POSTERIOR REPAIR N/A 03/31/2012   Procedure: ANTERIOR (CYSTOCELE) AND POSTERIOR REPAIR (RECTOCELE);  Surgeon:  Peggye Gull, MD;  Location: [facility];  Service: Gynecology;  Laterality: N/A;   aspiration cyst right breast     BREAST BIOPSY Left 01/09/2022   MM LT BREAST BX W LOC DEV 1ST LESION IMAGE BX SPEC STEREO GUIDE 01/09/2022 [facility]   THYROIDECTOMY  02/19/1995   with parathyroidectomy   TONSILLECTOMY  02/19/1967   uterine ablation     VAGINAL HYSTERECTOMY N/A 03/31/2012   Procedure: HYSTERECTOMY VAGINAL;  Surgeon: Peggye Gull, MD;  Location: [facility];  Service: Gynecology;  Laterality: N/A;     Social History:   reports that she has been smoking cigarettes. She has a 7.5 pack-year smoking history. She has been exposed to tobacco smoke. She has never used smokeless tobacco. She reports current alcohol use. She reports that she does not use drugs.   Family History:  Her family history includes Alzheimer's disease in her mother; Cancer in her father; Deep vein thrombosis in her mother; Diabetes in her mother; Heart disease in her father; Hypertension in her son; Other in her brother. There is no history of Colon cancer, Stomach cancer, Esophageal cancer, or Rectal cancer.   Allergies Allergies  Allergen Reactions   Permethrin  Rash     Home Medications  Prior to Admission medications   Medication Sig Start Date End Date Taking? Authorizing Provider  albuterol  (VENTOLIN  HFA) 108 (90 Base) MCG/ACT inhaler Inhale 2 puffs into the lungs every 6 (six) hours as needed for wheezing or shortness of breath. 10/06/22   Ward, Jessica Z, PA-C  Ascorbic Acid (VITAMIN C PO) Take 1 tablet by mouth daily.    [provider]  atenolol -chlorthalidone  (TENORETIC ) 50-25 MG tablet Take 0.5 tablets by mouth daily. 07/29/22   Rollene Almarie LABOR, MD  azithromycin  (ZITHROMAX  Z-PAK) 250 MG tablet Take 2 tablets by mouth today and then 1 tablet by mouth the following days. 10/06/22   Ward, Jessica Z, PA-C  benzonatate  (TESSALON ) 100 MG capsule Take 1 capsule (100 mg total) by mouth every 8 (eight) hours.  10/06/22   Ward, Harlene PEDLAR, PA-C  Black Cohosh 540 MG CAPS Take 540 mg by mouth 2 (two) times daily.     [provider]  buPROPion  (WELLBUTRIN  XL) 150 MG 24 hr tablet Take 1 tablet (150 mg total) by mouth daily. 07/29/22   Rollene Almarie LABOR, MD  butalbital -acetaminophen -caffeine  (ESGIC) 50-325-40 MG tablet Take 1 tablet by mouth 2 (two) times daily as needed for headache. 01/07/20   Jason Leita Repine, FNP  CALCIUM PO Take 1 tablet by mouth daily.    [provider]  COENZYME Q-10 PO Take 1 tablet by mouth daily.    [provider]  Cyanocobalamin (VITAMIN B-12 PO) Take 1 tablet by mouth daily.    [provider]  DULoxetine  (CYMBALTA ) 60 MG capsule Take 1 capsule (60 mg total) by mouth 2 (two) times daily. 07/29/22   Rollene Almarie LABOR, MD  ECHINACEA EXTRACT PO Take by mouth.    [provider]  EVENING PRIMROSE OIL PO Take by mouth.    [provider]  ferrous sulfate 325 (65 FE) MG tablet Take 325 mg by mouth every evening.     [provider]  fluticasone  (FLONASE ) 50 MCG/ACT nasal spray Use 2 spray in each nostril once daily 07/29/22   Rollene Almarie LABOR, MD  Ginkgo Biloba (GNP GINGKO BILOBA EXTRACT PO) Take 1 tablet by mouth daily.    [provider]  levothyroxine  (SYNTHROID ) 112 MCG tablet Take 1 tablet (112 mcg total) by mouth daily. 08/02/22   Rollene Almarie LABOR, MD  LORazepam  (ATIVAN ) 0.5 MG tablet Take 1 tablet (0.5 mg total) by mouth daily as needed for anxiety. 07/29/22   Rollene Almarie LABOR, MD  Misc Natural Products (GLUCOSAMINE CHOND COMPLEX/MSM) TABS Take 1 tablet by mouth 2 (two) times daily.    [provider]  Multiple Vitamins-Minerals (MULTIVITAMIN WITH MINERALS) tablet Take 1 tablet by mouth daily.    [provider]  Nutritional Supplements (DHEA PO) Take 1 tablet by mouth daily.    [provider]  omeprazole  (PRILOSEC) 20 MG capsule Take 1 capsule by mouth once  daily 12/27/22   Beather Delon Gibson, PA  triamcinolone  cream (KENALOG ) 0.1 % Apply 1 Application topically 2 (two) times daily. 12/20/21   McElwee, Tinnie LABOR, NP  Turmeric (QC TUMERIC COMPLEX PO) Take by mouth.    [provider]     Critical care time: n/a    Dorn Chill, MD [facility] Office: [phone]   See Amion for personal pager PCCM on call pager [phone] until 7pm. Please call Elink 7p-7a. [phone]

## 2023-02-21 NOTE — ED Notes (Signed)
 UA sent to lab

## 2023-02-21 NOTE — ED Triage Notes (Signed)
 Seen at [facility] today and sent here for further eval of cough, shortness of breath, wheezing and left sided chest pain x 3 days. Hx of pneumonia with same presentation. EKG changes noted from UC compared to 2015.   EMS VS:  158/90 HR 80 95% on RA

## 2023-02-21 NOTE — ED Notes (Signed)
 CCMD contacted for cardiac monitoring

## 2023-02-21 NOTE — ED Notes (Signed)
 EMS Arrived, Discussing Transport and Reasons for transport By EMS.

## 2023-02-21 NOTE — ED Notes (Signed)
 ED TO INPATIENT HANDOFF REPORT  ED Nurse Name and Phone #: Rosina Peak [phone]  S Name/Age/Gender Theresa Perez 73 y.o. female Room/Bed: 032C/032C  Code Status   Code Status: Not on file  Home/SNF/Other Home Patient oriented to: self, place, time, and situation Is this baseline? Yes   Triage Complete: Triage complete  Chief Complaint Mass of left lung [R91.8]  Triage Note Seen at [facility] today and sent here for further eval of cough, shortness of breath, wheezing and left sided chest pain x 3 days. Hx of pneumonia with same presentation. EKG changes noted from UC compared to 2015.   EMS VS:  158/90 HR 80 95% on RA   Allergies Allergies  Allergen Reactions   Permethrin  Rash    Level of Care/Admitting Diagnosis ED Disposition     ED Disposition  Admit   Condition  --   Comment  Hospital Area: [facility] [[id number]]  Level of Care: Telemetry Medical [104]  May place patient in observation at [facility] or [facility] if equivalent level of care is available:: No  Covid Evaluation: Confirmed COVID Negative  Diagnosis: Mass of left lung [8435919]  Admitting Physician: CHARLTON EVALENE RAMAN [[id number]]  Attending Physician: CHARLTON EVALENE RAMAN [[id number]]          B Medical/Surgery History Past Medical History:  Diagnosis Date   Allergic rhinitis 05/19/2007   Qualifier: Diagnosis of   By: Mavis MD, John E        Allergy    Anxiety    Arthritis    Cancer [facility]) 1997   Thyroid -Papillary   Complication of anesthesia    vomitting only   Constipation 07/13/2014   Cyst of breast, right, diffuse fibrocystic    Depression    Hypertension    Hypothyroidism    Memory change 06/25/2019   PONV (postoperative nausea and vomiting)    Pure hypercholesterolemia 12/08/2013   Smokers' cough (HCC) 12/11/2017   Past Surgical History:  Procedure Laterality Date   ANTERIOR AND POSTERIOR REPAIR N/A 03/31/2012   Procedure: ANTERIOR (CYSTOCELE) AND POSTERIOR  REPAIR (RECTOCELE);  Surgeon: Peggye Gull, MD;  Location: [facility];  Service: Gynecology;  Laterality: N/A;   aspiration cyst right breast     BREAST BIOPSY Left 01/09/2022   MM LT BREAST BX W LOC DEV 1ST LESION IMAGE BX SPEC STEREO GUIDE 01/09/2022 [facility]   THYROIDECTOMY  02/19/1995   with parathyroidectomy   TONSILLECTOMY  02/19/1967   uterine ablation     VAGINAL HYSTERECTOMY N/A 03/31/2012   Procedure: HYSTERECTOMY VAGINAL;  Surgeon: Peggye Gull, MD;  Location: [facility];  Service: Gynecology;  Laterality: N/A;     A IV Location/Drains/Wounds Patient Lines/Drains/Airways Status     Active Line/Drains/Airways     Name Placement date Placement time Site Days   Peripheral IV 02/21/23 22 G Left Antecubital 02/21/23  --  Antecubital  less than 1            Intake/Output Last 24 hours No intake or output data in the 24 hours ending 02/21/23 2005  Labs/Imaging Results for orders placed or performed during the hospital encounter of 02/21/23 (from the past 48 hours)  CBC     Status: None   Collection Time: 02/21/23 12:12 PM  Result Value Ref Range   WBC 9.4 4.0 - 10.5 K/uL   RBC 4.73 3.87 - 5.11 MIL/uL   Hemoglobin 14.2 12.0 - 15.0 g/dL   HCT 57.6 63.9 - 53.9 %  MCV 89.4 80.0 - 100.0 fL   MCH 30.0 26.0 - 34.0 pg   MCHC 33.6 30.0 - 36.0 g/dL   RDW 87.5 88.4 - 84.4 %   Platelets 366 150 - 400 K/uL   nRBC 0.0 0.0 - 0.2 %    Comment: Performed at [facility], [address], [place], [place] [address]  Troponin I (High Sensitivity)     Status: None   Collection Time: 02/21/23 12:12 PM  Result Value Ref Range   Troponin I (High Sensitivity) 6 <18 ng/L    Comment: (NOTE) Elevated high sensitivity troponin I (hsTnI) values and significant  changes across serial measurements may suggest ACS but many other  chronic and acute conditions are known to elevate hsTnI results.  Refer to the Links section for chest pain algorithms and additional  guidance. Performed  at [facility], [address], [place], [place] [address]   Comprehensive metabolic panel     Status: Abnormal   Collection Time: 02/21/23 12:12 PM  Result Value Ref Range   Sodium 137 135 - 145 mmol/L   Potassium 3.3 (L) 3.5 - 5.1 mmol/L   Chloride 99 98 - 111 mmol/L   CO2 24 22 - 32 mmol/L   Glucose, Bld 105 (H) 70 - 99 mg/dL    Comment: Glucose reference range applies only to samples taken after fasting for at least 8 hours.   BUN 8 8 - 23 mg/dL   Creatinine, Ser 9.27 0.44 - 1.00 mg/dL   Calcium 9.6 8.9 - 89.6 mg/dL   Total Protein 6.9 6.5 - 8.1 g/dL   Albumin 3.7 3.5 - 5.0 g/dL   AST 24 15 - 41 U/L   ALT 21 0 - 44 U/L   Alkaline Phosphatase 47 38 - 126 U/L   Total Bilirubin 1.0 0.0 - 1.2 mg/dL   GFR, Estimated >39 >39 mL/min    Comment: (NOTE) Calculated using the CKD-EPI Creatinine Equation (2021)    Anion gap 14 5 - 15    Comment: Performed at [facility], [address] [place], [place] [address]  Resp panel by RT-PCR (RSV, Flu A&B, Covid) Anterior Nasal Swab     Status: None   Collection Time: 02/21/23  1:52 PM   Specimen: Anterior Nasal Swab  Result Value Ref Range   SARS Coronavirus 2 by RT PCR NEGATIVE NEGATIVE   Influenza A by PCR NEGATIVE NEGATIVE   Influenza B by PCR NEGATIVE NEGATIVE    Comment: (NOTE) The Xpert Xpress SARS-CoV-2/FLU/RSV plus assay is intended as an aid in the diagnosis of influenza from Nasopharyngeal swab specimens and should not be used as a sole basis for treatment. Nasal washings and aspirates are unacceptable for Xpert Xpress SARS-CoV-2/FLU/RSV testing.  Fact Sheet for Patients: bloggercourse.com  Fact Sheet for Healthcare Providers: seriousbroker.it  This test is not yet approved or cleared by the [place]  FDA and has been authorized for detection and/or diagnosis of SARS-CoV-2 by FDA under an Emergency Use Authorization (EUA). This EUA will remain in effect  (meaning this test can be used) for the duration of the COVID-19 declaration under Section 564(b)(1) of the Act, 21 U.S.C. section 360bbb-3(b)(1), unless the authorization is terminated or revoked.     Resp Syncytial Virus by PCR NEGATIVE NEGATIVE    Comment: (NOTE) Fact Sheet for Patients: bloggercourse.com  Fact Sheet for Healthcare Providers: seriousbroker.it  This test is not yet approved or cleared by the [place]  FDA and  has been authorized for detection and/or diagnosis of SARS-CoV-2 by FDA under an Emergency Use Authorization (EUA). This EUA will remain in effect (meaning this test can be used) for the duration of the COVID-19 declaration under Section 564(b)(1) of the Act, 21 U.S.C. section 360bbb-3(b)(1), unless the authorization is terminated or revoked.  Performed at [facility], [address], [place], [place] [address]   Troponin I (High Sensitivity)     Status: None   Collection Time: 02/21/23  2:12 PM  Result Value Ref Range   Troponin I (High Sensitivity) 6 <18 ng/L    Comment: (NOTE) Elevated high sensitivity troponin I (hsTnI) values and significant  changes across serial measurements may suggest ACS but many other  chronic and acute conditions are known to elevate hsTnI results.  Refer to the Links section for chest pain algorithms and additional  guidance. Performed at [facility], [address] [place], [place] [address]    CT Chest W Contrast Result Date: 02/21/2023 CLINICAL DATA:  Shortness of breath and chest pain with left lung atelectasis on same day chest radiograph EXAM: CT CHEST WITH CONTRAST TECHNIQUE: Multidetector CT imaging of the chest was performed during intravenous contrast administration. RADIATION DOSE REDUCTION: This exam was performed according to the departmental dose-optimization program which includes automated exposure control, adjustment of the mA and/or kV  according to patient size and/or use of iterative reconstruction technique. CONTRAST:  75mL OMNIPAQUE  IOHEXOL  350 MG/ML SOLN COMPARISON:  Same day chest radiograph FINDINGS: Cardiovascular: Normal heart size. No significant pericardial fluid/thickening. Great vessels are normal in course and caliber. No central pulmonary emboli. Coronary artery calcifications and aortic atherosclerosis. Extensive enhancement of subcutaneous collateral vessels. Nonenhancement of the left internal jugular vein. Mediastinum/Nodes: Leftward shift of the mediastinum. Imaged thyroid  gland without nodules meeting criteria for imaging follow-up by size. Small hiatal hernia. No pathologically enlarged axillary, supraclavicular, mediastinal, or hilar lymph nodes. Lungs/Pleura: There is marked effacement of the left upper lobe bronchus and abrupt cut off of the left lower lobe bronchus. The central airways are otherwise patent. Near-complete atelectasis of the left upper lobe with subsegmental atelectasis of the lingula. Within this area of left upper lobe atelectasis, there is masslike hypoenhancement measuring 7.1 x 2.8 cm (3:44) which demonstrates extension to the hilum (3:59). Minimal linear atelectasis at the left lower lobe base. Scattered linear atelectasis in the right middle and lower lobes. Right apical pleural-parenchymal scarring. No pneumothorax. Small left pleural effusion. Upper abdomen: Normal. Musculoskeletal: No acute or abnormal lytic or blastic osseous lesions. Multilevel degenerative changes of the thoracic spine. Rounded density in the upper inner right breast measures 1.4 x 1.1 cm (3:81). IMPRESSION: 1. Marked effacement of the left upper lobe bronchus and abrupt cut off of the left lower lobe bronchus with near-complete atelectasis of the left upper lobe and subsegmental atelectasis of the lingula. Within this area of left upper lobe atelectasis, there is masslike hypoenhancement measuring 7.1 x 2.8 cm which  demonstrates extension to the hilum. Findings are suspicious for primary lung malignancy, less likely superimposed pneumonia. 2. Small left pleural effusion. 3. Nonenhancement of the left internal jugular vein with extensive enhancement of subcutaneous collateral vessels. Recommend correlation with Doppler ultrasound examination of the left internal jugular vein. 4. Rounded density in the upper inner right breast measures 1.4 x 1.1 cm. Recommend correlation with dedicated breast imaging, if this finding has not been previously evaluated. 5. Aortic Atherosclerosis (ICD10-I70.0). Coronary artery calcifications. Assessment for potential risk factor modification,  dietary therapy or pharmacologic therapy may be warranted, if clinically indicated. Electronically Signed   By: Limin  Xu M.D.   On: 02/21/2023 16:51   DG Chest 2 View Addendum Date: 02/21/2023 ADDENDUM REPORT: 02/21/2023 14:28 ADDENDUM: Case discussed with Dorn Davis's clinical service at 2:25 p.m. Electronically Signed   By: Rockey Kilts M.D.   On: 02/21/2023 14:28   Result Date: 02/21/2023 CLINICAL DATA:  Shortness of breath and chest pain EXAM: CHEST - 2 VIEW COMPARISON:  10/06/2022 FINDINGS: Surgical clips in the low neck. Midline trachea. Normal heart size. No pleural effusion or pneumothorax. Clear right lung. Soft tissue fullness in the left suprahilar region with increased anterior density on the lateral view. Volume loss throughout the left hemithorax with new left hemidiaphragm elevation. IMPRESSION: Abnormal appearance of the left hemithorax, suspicious for central left upper lobe/suprahilar lung mass with secondary partial collapse and hemidiaphragm elevation. Recommend further evaluation with contrast enhanced chest CT. A call to the emergency room physician is pending. Electronically Signed: By: Rockey Kilts M.D. On: 02/21/2023 14:17    Pending Labs Unresulted Labs (From admission, onward)    None       Vitals/Pain Today's  Vitals   02/21/23 1607 02/21/23 1832 02/21/23 1900 02/21/23 2004  BP: (!) 153/96  (!) 169/98 (!) 175/100  Pulse: 84  84 88  Resp: 18  20 (!) 21  Temp: 98.6 F (37 C)   99.4 F (37.4 C)  TempSrc: Oral   Oral  SpO2: 95%  97% 96%  Weight:      Height:      PainSc:  4       Isolation Precautions No active isolations  Medications Medications  iohexol  (OMNIPAQUE ) 350 MG/ML injection 75 mL (75 mLs Intravenous Contrast Given 02/21/23 1502)    Mobility walks     Focused Assessments Pulmonary Assessment Handoff:  Lung sounds:   O2 Device: Room Air      R Recommendations: See Admitting Provider Note  Report given to:   Additional Notes:

## 2023-02-22 ENCOUNTER — Observation Stay (HOSPITAL_BASED_OUTPATIENT_CLINIC_OR_DEPARTMENT_OTHER): Payer: Medicare PPO

## 2023-02-22 ENCOUNTER — Telehealth: Payer: Self-pay | Admitting: Acute Care

## 2023-02-22 DIAGNOSIS — M7989 Other specified soft tissue disorders: Secondary | ICD-10-CM

## 2023-02-22 DIAGNOSIS — R918 Other nonspecific abnormal finding of lung field: Secondary | ICD-10-CM | POA: Diagnosis not present

## 2023-02-22 LAB — CBC
HCT: 41 % (ref 36.0–46.0)
Hemoglobin: 14 g/dL (ref 12.0–15.0)
MCH: 30.2 pg (ref 26.0–34.0)
MCHC: 34.1 g/dL (ref 30.0–36.0)
MCV: 88.4 fL (ref 80.0–100.0)
Platelets: 366 10*3/uL (ref 150–400)
RBC: 4.64 MIL/uL (ref 3.87–5.11)
RDW: 12.5 % (ref 11.5–15.5)
WBC: 10 10*3/uL (ref 4.0–10.5)
nRBC: 0 % (ref 0.0–0.2)

## 2023-02-22 LAB — BASIC METABOLIC PANEL WITH GFR
Anion gap: 11 (ref 5–15)
BUN: 9 mg/dL (ref 8–23)
CO2: 27 mmol/L (ref 22–32)
Calcium: 9.3 mg/dL (ref 8.9–10.3)
Chloride: 100 mmol/L (ref 98–111)
Creatinine, Ser: 0.64 mg/dL (ref 0.44–1.00)
GFR, Estimated: >60 mL/min
Glucose, Bld: 123 mg/dL — ABNORMAL HIGH (ref 70–99)
Potassium: 3.6 mmol/L (ref 3.5–5.1)
Sodium: 138 mmol/L (ref 135–145)

## 2023-02-22 LAB — MAGNESIUM: Magnesium: 2.2 mg/dL (ref 1.7–2.4)

## 2023-02-22 MED ORDER — SENNOSIDES-DOCUSATE SODIUM 8.6-50 MG PO TABS
1.0000 | ORAL_TABLET | Freq: Two times a day (BID) | ORAL | Status: DC
  Filled 2023-02-22: qty 1

## 2023-02-22 MED ORDER — OMEPRAZOLE 20 MG PO CPDR: 20.0000 mg | DELAYED_RELEASE_CAPSULE | Freq: Every day | ORAL | 0 refills | Status: DC

## 2023-02-22 NOTE — Progress Notes (Signed)
 NAME:  Theresa Perez, MRN:  [id number], DOB:  [birth year], LOS: 0 ADMISSION DATE:  02/21/2023, CONSULTATION DATE:  02/21/23 REFERRING MD: Norleen Essex, PA  CHIEF COMPLAINT:  Lung Mass   History of Present Illness:  Theresa Perez is a 73 year old woman, daily smoker with history of thyroid  papillary carcinoma, hypertension and anxiety/depression who presented to the ER today with shortness of breath and left side chest pain.   Chest radiograph today showed concern for left upper lob/suprahilar lung mass with secondary collapse of lung. CT Chest with contrast done which shows LUL mass 7.1 x 2.8cm with extension into the hilum. Occlusion of the left upper lobe bronchus with near complete atelectasis of LUL. Small left pleural effusion. Concern for non-enhancement of the left internal jugular vein with formation of collaterals concerning for occlusion or blood clots.   She smokes a couple of cigarettes per day and is around second hand smoke from her husband at times for 30+ years. She is retired, no history of chemical or dust exposures. No hemoptysis. No weight loss, fevers, chills or sweats.  Pertinent  Medical History   Past Medical History:  Diagnosis Date   Allergic rhinitis 05/19/2007   Qualifier: Diagnosis of   By: Mavis MD, Norleen BRAVO        Allergy    Anxiety    Arthritis    Cancer [facility]) 1997   Thyroid -Papillary   Complication of anesthesia    vomitting only   Constipation 07/13/2014   Cyst of breast, right, diffuse fibrocystic    Depression    Hypertension    Hypothyroidism    Memory change 06/25/2019   PONV (postoperative nausea and vomiting)    Pure hypercholesterolemia 12/08/2013   Smokers' cough (HCC) 12/11/2017     Significant Hospital Events: Including procedures, antibiotic start and stop dates in addition to other pertinent events   1/3 admitted to [facility], PCCM consulted for lung mass 1/4 No issues overnight, remains on RA, US  pending   Interim History /  Subjective:  States she feels well this am, no acute complaints   Objective   Blood pressure (!) 129/92, pulse 65, temperature 98.2 F (36.8 C), temperature source Oral, resp. rate 18, height 5' 9 (1.753 m), weight 86.2 kg, SpO2 96%.       No intake or output data in the 24 hours ending 02/22/23 0742 Filed Weights   02/21/23 1206  Weight: 86.2 kg   Examination: General: Well appearing middle aged female lying in bed in NAD  HEENT: [place]/AT, MM pink/moist, PERRL,  Neuro: Alert and oriented x3, non-focal  CV: s1s2 regular rate and rhythm, no murmur, rubs, or gallops,  PULM:  Clear to auscultation, no increased work of breathing on RA GI: soft, bowel sounds active in all 4 quadrants, non-tender, non-distended, tolerating oral diet Extremities: warm/dry, no edema  Skin: no rashes or lesions  Resolved Hospital Problem list     Assessment & Plan:  Left Upper Lobe Lung Mass Possible Left internal jugular vein occlusion Hypothyroidism Cigarette Smoker  Plan: Message sent to office 1/4 for request of outpatient follow up and notification for outpatient bronch  Vascular ultrasounds pending  As needed BD's Stable for discharge from pulmonary standpoint   Best Practice (right click and Reselect all SmartList Selections daily)   Per primary  Critical care time: n/a  Shahd Occhipinti D. Arloa, NP-C [facility] Personal contact information can be found on Amion  If no contact or response made  please call [phone] 02/22/2023, 7:43 AM

## 2023-02-22 NOTE — Progress Notes (Signed)
 VASCULAR LAB    Left upper extremity venous duplex has been performed.  See CV proc for preliminary results.   Marlon Suleiman, RVT 02/22/2023, 3:47 PM

## 2023-02-22 NOTE — Telephone Encounter (Signed)
 Message sent to office to arrange for follow up with either Dr. Brenna or Dr. Byrum for lung mass workup and outpatient bronch.   Mattia Liford D. Harris, NP-C [facility] Personal contact information can be found on Amion  If no contact or response made please call [phone] 02/22/2023, 8:10 AM

## 2023-02-22 NOTE — Discharge Summary (Signed)
 Physician Discharge Summary   Patient: Theresa Perez MRN: [id number] DOB: [birth year]  Admit date:     02/21/2023  Discharge date: 02/22/2023  Discharge Physician: Yetta Blanch  PCP: Rollene Almarie LABOR, MD  Recommendations at discharge: Follow-up with PCP in 1 week.   Follow-up Information     Rollene Almarie LABOR, MD. Schedule an appointment as soon as possible for a visit in 1 week(s).   Specialty: Internal Medicine Contact information: [address] [place] [address] [phone]         [facility] at [facility]. Go to.   Specialty: Pulmonology Why: the Appointment As Scheduled Contact information: [address] [place]  [address] [phone]               Discharge Diagnoses: Principal Problem:   Mass of left lung Active Problems:   Essential hypertension   Anxiety and depression   Hypokalemia   Postoperative hypothyroidism  Hospital Course:  1. Left lung mass  - Appreciate pulmonology consultation, they are arranging for outpatient bronchoscopy for biopsy     2. ?Left internal jugular occlusion ruled out.   3. Hypokalemia  Replaced    4. Hypothyroidism  - Synthroid  continue   5. Depression   - Wellbutrin , Cymbalta    6. Right breast lesion  - Rounded density noted on in upper inner right breast on CT in ED  - Outpatient follow-up advised    Consultants:  PCCM   Procedures performed:  None  DISCHARGE MEDICATION: Allergies as of 02/22/2023       Reactions   Permethrin  Rash        Medication List     TAKE these medications    albuterol  108 (90 Base) MCG/ACT inhaler Commonly known as: VENTOLIN  HFA Inhale 2 puffs into the lungs every 6 (six) hours as needed for wheezing or shortness of breath.   buPROPion  150 MG 24 hr tablet Commonly known as: WELLBUTRIN  XL Take 1 tablet (150 mg total) by mouth daily.   CALCIUM PO Take 1 tablet by mouth daily.   COENZYME  Q-10 PO Take 1 tablet by mouth daily.   DHEA PO Take 1 tablet by mouth daily.   DULoxetine  60 MG capsule Commonly known as: CYMBALTA  Take 1 capsule (60 mg total) by mouth 2 (two) times daily.   ferrous sulfate 325 (65 FE) MG tablet Take 325 mg by mouth every evening.   Glucosamine Chond Complex/MSM Tabs Take 1 tablet by mouth 2 (two) times daily.   GNP GINGKO BILOBA EXTRACT PO Take 1 tablet by mouth daily.   levothyroxine  112 MCG tablet Commonly known as: SYNTHROID  Take 1 tablet (112 mcg total) by mouth daily.   multivitamin with minerals tablet Take 1 tablet by mouth daily.   omeprazole  20 MG capsule Commonly known as: PRILOSEC Take 1 capsule (20 mg total) by mouth daily.   QC TUMERIC COMPLEX PO Take 1 tablet by mouth daily.   VITAMIN B-12 PO Take 1 tablet by mouth daily.   VITAMIN C PO Take 1 tablet by mouth daily.       Disposition: Home Diet recommendation: Cardiac diet  Discharge Exam: Vitals:   02/21/23 2108 02/22/23 0324 02/22/23 0751 02/22/23 1237  BP: (!) 171/95 (!) 129/92 (!) 151/82 (!) 161/87  Pulse: 73 65 71 73  Resp: 18 18 18 18   Temp: 98.9 F (37.2 C) 98.2 F (36.8 C) 97.8 F (36.6 C) 99.2 F (37.3 C)  TempSrc: Oral  Oral Oral Oral  SpO2: 94% 96% 96% 96%  Weight:      Height:       General: Appear in no distress; no visible Abnormal Neck Mass Or lumps, Conjunctiva normal Cardiovascular: S1 and S2 Present, no Murmur, Respiratory: good respiratory effort, Bilateral Air entry present and faint basal crackles, no wheezes Abdomen: Bowel Sound present, Non tender  Extremities: no Pedal edema Neurology: alert and oriented to time, place, and person  Filed Weights   02/21/23 1206  Weight: 86.2 kg   Condition at discharge: stable  The results of significant diagnostics from this hospitalization (including imaging, microbiology, ancillary and laboratory) are listed below for reference.   Imaging Studies: VAS US  UPPER EXTREMITY VENOUS  DUPLEX Result Date: 02/22/2023 UPPER VENOUS STUDY  Patient Name:  YANETTE TRIPOLI  Date of Exam:   02/22/2023 Medical Rec #: [id number]         Accession #:    [id number] Date of Birth: [birth year]         Patient Gender: F Patient Age:   30 years Exam Location:  [facility] Procedure:      VAS US  UPPER EXTREMITY VENOUS DUPLEX Referring Phys: TIMOTHY OPYD --------------------------------------------------------------------------------  Indications: Enhancement of left internal jugular vein noted in CT of chest. New lung mass, measuring 7.1 cm X 2.8 cm noted in the left upper lobe extending into the hilum. Risk Factors: Cancer Papillary thyroid  carcinoma status post resection 1997. Limitations: Line and musculoskeletal features. Comparison Study: No prior upper extremity venous duplex on file Performing Technologist: Alberta Lis RVS  Examination Guidelines: A complete evaluation includes B-mode imaging, spectral Doppler, color Doppler, and power Doppler as needed of all accessible portions of each vessel. Bilateral testing is considered an integral part of a complete examination. Limited examinations for reoccurring indications may be performed as noted.  Right Findings: +----------+------------+---------+-----------+----------+-------+ RIGHT     CompressiblePhasicitySpontaneousPropertiesSummary +----------+------------+---------+-----------+----------+-------+ IJV           Full       Yes       Yes                      +----------+------------+---------+-----------+----------+-------+ Subclavian               Yes       Yes                      +----------+------------+---------+-----------+----------+-------+  Left Findings: +----------+------------+---------+-----------+----------+-------+ LEFT      CompressiblePhasicitySpontaneousPropertiesSummary +----------+------------+---------+-----------+----------+-------+ IJV           Full       Yes       Yes                       +----------+------------+---------+-----------+----------+-------+ Subclavian               Yes       Yes                      +----------+------------+---------+-----------+----------+-------+ Axillary                 Yes       Yes                      +----------+------------+---------+-----------+----------+-------+ Brachial      Full       Yes       Yes                      +----------+------------+---------+-----------+----------+-------+  Radial        Full                                          +----------+------------+---------+-----------+----------+-------+ Ulnar         Full                                          +----------+------------+---------+-----------+----------+-------+ Cephalic      Full                                          +----------+------------+---------+-----------+----------+-------+ Basilic       Full                                          +----------+------------+---------+-----------+----------+-------+ Left internal jugular vein appears patent. The mid to distal portion appears to be tortuous, crossing behind the CCA.  Summary:  Right: No evidence of thrombosis in the subclavian.  Left: No evidence of deep vein thrombosis in the upper extremity. No evidence of superficial vein thrombosis in the upper extremity.  *See table(s) above for measurements and observations.     Preliminary    CT Chest W Contrast Result Date: 02/21/2023 CLINICAL DATA:  Shortness of breath and chest pain with left lung atelectasis on same day chest radiograph EXAM: CT CHEST WITH CONTRAST TECHNIQUE: Multidetector CT imaging of the chest was performed during intravenous contrast administration. RADIATION DOSE REDUCTION: This exam was performed according to the departmental dose-optimization program which includes automated exposure control, adjustment of the mA and/or kV according to patient size and/or use of iterative reconstruction technique. CONTRAST:   75mL OMNIPAQUE  IOHEXOL  350 MG/ML SOLN COMPARISON:  Same day chest radiograph FINDINGS: Cardiovascular: Normal heart size. No significant pericardial fluid/thickening. Great vessels are normal in course and caliber. No central pulmonary emboli. Coronary artery calcifications and aortic atherosclerosis. Extensive enhancement of subcutaneous collateral vessels. Nonenhancement of the left internal jugular vein. Mediastinum/Nodes: Leftward shift of the mediastinum. Imaged thyroid  gland without nodules meeting criteria for imaging follow-up by size. Small hiatal hernia. No pathologically enlarged axillary, supraclavicular, mediastinal, or hilar lymph nodes. Lungs/Pleura: There is marked effacement of the left upper lobe bronchus and abrupt cut off of the left lower lobe bronchus. The central airways are otherwise patent. Near-complete atelectasis of the left upper lobe with subsegmental atelectasis of the lingula. Within this area of left upper lobe atelectasis, there is masslike hypoenhancement measuring 7.1 x 2.8 cm (3:44) which demonstrates extension to the hilum (3:59). Minimal linear atelectasis at the left lower lobe base. Scattered linear atelectasis in the right middle and lower lobes. Right apical pleural-parenchymal scarring. No pneumothorax. Small left pleural effusion. Upper abdomen: Normal. Musculoskeletal: No acute or abnormal lytic or blastic osseous lesions. Multilevel degenerative changes of the thoracic spine. Rounded density in the upper inner right breast measures 1.4 x 1.1 cm (3:81). IMPRESSION: 1. Marked effacement of the left upper lobe bronchus and abrupt cut off of the left lower lobe bronchus with near-complete atelectasis of the left upper lobe and subsegmental atelectasis of the lingula. Within this area of left  upper lobe atelectasis, there is masslike hypoenhancement measuring 7.1 x 2.8 cm which demonstrates extension to the hilum. Findings are suspicious for primary lung malignancy, less  likely superimposed pneumonia. 2. Small left pleural effusion. 3. Nonenhancement of the left internal jugular vein with extensive enhancement of subcutaneous collateral vessels. Recommend correlation with Doppler ultrasound examination of the left internal jugular vein. 4. Rounded density in the upper inner right breast measures 1.4 x 1.1 cm. Recommend correlation with dedicated breast imaging, if this finding has not been previously evaluated. 5. Aortic Atherosclerosis (ICD10-I70.0). Coronary artery calcifications. Assessment for potential risk factor modification, dietary therapy or pharmacologic therapy may be warranted, if clinically indicated. Electronically Signed   By: Limin  Xu M.D.   On: 02/21/2023 16:51   DG Chest 2 View Addendum Date: 02/21/2023 ADDENDUM REPORT: 02/21/2023 14:28 ADDENDUM: Case discussed with Dorn Davis's clinical service at 2:25 p.m. Electronically Signed   By: Rockey Kilts M.D.   On: 02/21/2023 14:28   Result Date: 02/21/2023 CLINICAL DATA:  Shortness of breath and chest pain EXAM: CHEST - 2 VIEW COMPARISON:  10/06/2022 FINDINGS: Surgical clips in the low neck. Midline trachea. Normal heart size. No pleural effusion or pneumothorax. Clear right lung. Soft tissue fullness in the left suprahilar region with increased anterior density on the lateral view. Volume loss throughout the left hemithorax with new left hemidiaphragm elevation. IMPRESSION: Abnormal appearance of the left hemithorax, suspicious for central left upper lobe/suprahilar lung mass with secondary partial collapse and hemidiaphragm elevation. Recommend further evaluation with contrast enhanced chest CT. A call to the emergency room physician is pending. Electronically Signed: By: Rockey Kilts M.D. On: 02/21/2023 14:17    Microbiology: Results for orders placed or performed during the hospital encounter of 02/21/23  Resp panel by RT-PCR (RSV, Flu A&B, Covid) Anterior Nasal Swab     Status: None   Collection  Time: 02/21/23  1:52 PM   Specimen: Anterior Nasal Swab  Result Value Ref Range Status   SARS Coronavirus 2 by RT PCR NEGATIVE NEGATIVE Final   Influenza A by PCR NEGATIVE NEGATIVE Final   Influenza B by PCR NEGATIVE NEGATIVE Final    Comment: (NOTE) The Xpert Xpress SARS-CoV-2/FLU/RSV plus assay is intended as an aid in the diagnosis of influenza from Nasopharyngeal swab specimens and should not be used as a sole basis for treatment. Nasal washings and aspirates are unacceptable for Xpert Xpress SARS-CoV-2/FLU/RSV testing.  Fact Sheet for Patients: bloggercourse.com  Fact Sheet for Healthcare Providers: seriousbroker.it  This test is not yet approved or cleared by the [place]  FDA and has been authorized for detection and/or diagnosis of SARS-CoV-2 by FDA under an Emergency Use Authorization (EUA). This EUA will remain in effect (meaning this test can be used) for the duration of the COVID-19 declaration under Section 564(b)(1) of the Act, 21 U.S.C. section 360bbb-3(b)(1), unless the authorization is terminated or revoked.     Resp Syncytial Virus by PCR NEGATIVE NEGATIVE Final    Comment: (NOTE) Fact Sheet for Patients: bloggercourse.com  Fact Sheet for Healthcare Providers: seriousbroker.it  This test is not yet approved or cleared by the [place]  FDA and has been authorized for detection and/or diagnosis of SARS-CoV-2 by FDA under an Emergency Use Authorization (EUA). This EUA will remain in effect (meaning this test can be used) for the duration of the COVID-19 declaration under Section 564(b)(1) of the Act, 21 U.S.C. section 360bbb-3(b)(1), unless the authorization is terminated or revoked.  Performed at [facility],  [address], [place], [place] [address]    Labs: CBC: Recent Labs  Lab 02/21/23 1212 02/22/23 0656  WBC 9.4 10.0  HGB 14.2  14.0  HCT 42.3 41.0  MCV 89.4 88.4  PLT 366 366   Basic Metabolic Panel: Recent Labs  Lab 02/21/23 1212 02/22/23 0656  NA 137 138  K 3.3* 3.6  CL 99 100  CO2 24 27  GLUCOSE 105* 123*  BUN 8 9  CREATININE 0.72 0.64  CALCIUM 9.6 9.3  MG  --  2.2   Liver Function Tests: Recent Labs  Lab 02/21/23 1212  AST 24  ALT 21  ALKPHOS 47  BILITOT 1.0  PROT 6.9  ALBUMIN 3.7   CBG: No results for input(s): GLUCAP in the last 168 hours.  Discharge time spent: greater than 30 minutes.  Author: Yetta Blanch, MD  [facility] 02/22/2023

## 2023-03-04 ENCOUNTER — Encounter: Payer: Self-pay | Admitting: Acute Care

## 2023-03-04 ENCOUNTER — Ambulatory Visit: Payer: Medicare PPO | Admitting: Acute Care

## 2023-03-04 ENCOUNTER — Encounter: Payer: Self-pay | Admitting: Emergency Medicine

## 2023-03-04 VITALS — BP 136/76 | HR 68 | Temp 97.8°F

## 2023-03-04 DIAGNOSIS — F17200 Nicotine dependence, unspecified, uncomplicated: Secondary | ICD-10-CM | POA: Diagnosis not present

## 2023-03-04 DIAGNOSIS — F17210 Nicotine dependence, cigarettes, uncomplicated: Secondary | ICD-10-CM

## 2023-03-04 DIAGNOSIS — R911 Solitary pulmonary nodule: Secondary | ICD-10-CM | POA: Diagnosis not present

## 2023-03-04 DIAGNOSIS — R0602 Shortness of breath: Secondary | ICD-10-CM | POA: Diagnosis not present

## 2023-03-04 MED ORDER — ALBUTEROL SULFATE HFA 108 (90 BASE) MCG/ACT IN AERS: 2.0000 | INHALATION_SPRAY | Freq: Four times a day (QID) | RESPIRATORY_TRACT | 6 refills | Status: AC | PRN

## 2023-03-04 NOTE — Progress Notes (Signed)
 History of Present Illness Theresa Perez is a 73 y.o. female current every day smoker with history of thyroid  papillary carcinoma, referred 02/2023 for consideration of bronchoscopy with biopsies of a newly discovered  abnormal lung mass. She will be followed by Dr. Shelah.     Synopsis Theresa Perez is a 73 year old woman, daily smoker with history of thyroid  papillary carcinoma, hypertension and anxiety/depression who presented to the ER 02/22/2023  with shortness of breath and left side chest pain.    Chest radiograph showed concern for left upper lob/suprahilar lung mass with secondary collapse of lung. CT Chest with contrast done which shows LUL mass 7.1 x 2.8cm with extension into the hilum. Occlusion of the left upper lobe bronchus with near complete atelectasis of LUL. Small left pleural effusion. Concern for non-enhancement of the left internal jugular vein with formation of collaterals concerning for occlusion or blood clots.    She smokes a couple of cigarettes per day and is around second hand smoke from her husband at times for 30+ years. She is retired, no history of chemical or dust exposures. No hemoptysis. No weight loss, fevers, chills or sweats.  She was seen by the inpatient team, and she was scheduled for a pulmonary consult to evaluate her for bronchoscopy with biopsies to better evaluate the lung mass. She is high risk with her smoking history, and second hand smoke exposure.   03/05/2023 Pt. Presents for consultation and  evaluation for bronchoscopy with biopsies of a newly discovered LUL mass 7.1 x 2.8cm with extension into the hilum. She was seen in the ED 02/22/2023 with shortness of breath and left sided chest pain. There was notation of occlusion of the left upper lobe bronchus with near complete atelectasis of LUL. Small left pleural effusion. Concern for non-enhancement of the left internal jugular vein with formation of collaterals concerning for occlusion or blood  clots, however vas ultrasound upper extremity venous duplex study done 02/22/2023 showed. No evidence of thrombosis in the right subclavian, and no evidence of deep vein thrombosis in the left upper extremity. No evidence of  superficial vein thrombosis in the left upper extremity.  Patient usually plays tennis on a regular basis, and works out.  She is very anxious to return to some kind of physical activity.  I have encouraged her to continue her physical activities but not to exert herself.  She states that she does have dyspnea on exertion.  She has been using an old albuterol  inhaler.  I have placed an order for a new albuterol  inhaler for her to use with shortness of breath or wheezing.  She understands if she needs to use this more than 4 times daily she needs to call us  to be seen.  Patient is very concerned regarding the above findings.  Plan will be for bronchoscopy with biopsies with Dr. Shelah to better evaluate the lung nodules.  She is in agreement with this plan and had no further questions or concerns at completion of the visit    She was seen by the inpatient team and referred for consult and evaluation for bronchoscopy with biopsies .  We have reviewed her CT results. She is in agreement with bronchoscopy with biopsies.. We have reviewed the risks and benefits of the procedure.  She is in agreement with to proceed with bronchoscopy and biopsies to better evaluate the process in her lung.  She is very anxious that she knows she needs to move forward to determine  diagnosis and therefore treatment.  She is still smoking, I have counseled her to quit.  We discussed the use of nicotine replacement therapy.  She was provided with the 1 800 quit NOW number for free nicotine replacement therapy.  She does continue to endorse dyspnea on exertion however the left-sided chest pain has resolved.  Test Results: CT Chest 02/21/2023  Marked effacement of the left upper lobe bronchus and abrupt cut off  of the left lower lobe bronchus with near-complete atelectasis of the left upper lobe and subsegmental atelectasis of the lingula. Within this area of left upper lobe atelectasis, there is masslike hypoenhancement measuring 7.1 x 2.8 cm which demonstrates extension to the hilum. Findings are suspicious for primary lung malignancy, less likely superimposed pneumonia. 2. Small left pleural effusion. 3. Nonenhancement of the left internal jugular vein with extensive enhancement of subcutaneous collateral vessels. Recommend correlation with Doppler ultrasound examination of the left internal jugular vein. 4. Rounded density in the upper inner right breast measures 1.4 x 1.1 cm. Recommend correlation with dedicated breast imaging, if this finding has not been previously evaluated. 5. Aortic Atherosclerosis (ICD10-I70.0). Coronary artery calcifications. Assessment for potential risk factor modification, dietary therapy or pharmacologic therapy may be warranted, if clinically indicated.  02/22/2023 Vas ultrasound upper extremity venous duplex No evidence of thrombosis in the right subclavian.  No evidence of deep vein thrombosis in the left upper extremity. No evidence of  superficial vein thrombosis in the left upper extremity     Latest Ref Rng & Units 02/22/2023    6:56 AM 02/21/2023   12:12 PM 12/31/2022    2:08 PM  CBC  WBC 4.0 - 10.5 K/uL 10.0  9.4  5.9   Hemoglobin 12.0 - 15.0 g/dL 85.9  85.7  85.8   Hematocrit 36.0 - 46.0 % 41.0  42.3  43.7   Platelets 150 - 400 K/uL 366  366  321        Latest Ref Rng & Units 02/22/2023    6:56 AM 02/21/2023   12:12 PM 12/31/2022    2:08 PM  BMP  Glucose 70 - 99 mg/dL 876  894  86   BUN 8 - 23 mg/dL 9  8  6    Creatinine 0.44 - 1.00 mg/dL 9.35  9.27  9.29   BUN/Creat Ratio 12 - 28   9   Sodium 135 - 145 mmol/L 138  137  143   Potassium 3.5 - 5.1 mmol/L 3.6  3.3  4.0   Chloride 98 - 111 mmol/L 100  99  103   CO2 22 - 32 mmol/L 27  24  26     Calcium 8.9 - 10.3 mg/dL 9.3  9.6  8.9     BNP No results found for: BNP  ProBNP No results found for: PROBNP  PFT No results found for: FEV1PRE, FEV1POST, FVCPRE, FVCPOST, TLC, DLCOUNC, PREFEV1FVCRT, PSTFEV1FVCRT  US  LIMITED JOINT SPACE STRUCTURES LOW RIGHT(NO LINKED CHARGES) Result Date: 02/25/2023 Procedure: Real-time Ultrasound Guided Injection of right knee joint superior lateral patella space Device: Philips Affiniti 50G/GE Logiq Images permanently stored and available for review in PACS Verbal informed consent obtained.  Discussed risks and benefits of procedure. Warned about infection, bleeding, hyperglycemia damage to structures among others. Patient expresses understanding and agreement Time-out conducted.  Noted no overlying erythema, induration, or other signs of local infection.  Skin prepped in a sterile fashion.  Local anesthesia: Topical Ethyl chloride.  With sterile technique and under real time ultrasound guidance:  40 mg of Kenalog  and 2 mL of Marcaine  injected into knee joint. Fluid seen entering the joint capsule.  Completed without difficulty  Pain immediately resolved suggesting accurate placement of the medication.  Advised to call if fevers/chills, erythema, induration, drainage, or persistent bleeding.  Images permanently stored and available for review in the ultrasound unit. Impression: Technically successful ultrasound guided injection.    VAS US  UPPER EXTREMITY VENOUS DUPLEX Result Date: 02/23/2023 UPPER VENOUS STUDY  Patient Name:  SAMIRA ACERO  Date of Exam:   02/22/2023 Medical Rec #: [id number]         Accession #:    [id number] Date of Birth: August 19, 1950         Patient Gender: F Patient Age:   56 years Exam Location:  [facility] Procedure:      VAS US  UPPER EXTREMITY VENOUS DUPLEX Referring Phys: TIMOTHY OPYD --------------------------------------------------------------------------------  Indications: Enhancement of left internal jugular  vein noted in CT of chest. New lung mass, measuring 7.1 cm X 2.8 cm noted in the left upper lobe extending into the hilum. Risk Factors: Cancer Papillary thyroid  carcinoma status post resection 1997. Limitations: Line and musculoskeletal features. Comparison Study: No prior upper extremity venous duplex on file Performing Technologist: Alberta Lis RVS  Examination Guidelines: A complete evaluation includes B-mode imaging, spectral Doppler, color Doppler, and power Doppler as needed of all accessible portions of each vessel. Bilateral testing is considered an integral part of a complete examination. Limited examinations for reoccurring indications may be performed as noted.  Right Findings: +----------+------------+---------+-----------+----------+-------+ RIGHT     CompressiblePhasicitySpontaneousPropertiesSummary +----------+------------+---------+-----------+----------+-------+ IJV           Full       Yes       Yes                      +----------+------------+---------+-----------+----------+-------+ Subclavian               Yes       Yes                      +----------+------------+---------+-----------+----------+-------+  Left Findings: +----------+------------+---------+-----------+----------+-------+ LEFT      CompressiblePhasicitySpontaneousPropertiesSummary +----------+------------+---------+-----------+----------+-------+ IJV           Full       Yes       Yes                      +----------+------------+---------+-----------+----------+-------+ Subclavian               Yes       Yes                      +----------+------------+---------+-----------+----------+-------+ Axillary                 Yes       Yes                      +----------+------------+---------+-----------+----------+-------+ Brachial      Full       Yes       Yes                      +----------+------------+---------+-----------+----------+-------+ Radial        Full                                           +----------+------------+---------+-----------+----------+-------+  Ulnar         Full                                          +----------+------------+---------+-----------+----------+-------+ Cephalic      Full                                          +----------+------------+---------+-----------+----------+-------+ Basilic       Full                                          +----------+------------+---------+-----------+----------+-------+ Left internal jugular vein appears patent. The mid to distal portion appears to be tortuous, crossing behind the CCA.  Summary:  Right: No evidence of thrombosis in the subclavian.  Left: No evidence of deep vein thrombosis in the upper extremity. No evidence of superficial vein thrombosis in the upper extremity.  *See table(s) above for measurements and observations.  Diagnosing physician: Norman Serve Electronically signed by Norman Serve on 02/23/2023 at 9:51:13 AM.    Final    CT Chest W Contrast Result Date: 02/21/2023 CLINICAL DATA:  Shortness of breath and chest pain with left lung atelectasis on same day chest radiograph EXAM: CT CHEST WITH CONTRAST TECHNIQUE: Multidetector CT imaging of the chest was performed during intravenous contrast administration. RADIATION DOSE REDUCTION: This exam was performed according to the departmental dose-optimization program which includes automated exposure control, adjustment of the mA and/or kV according to patient size and/or use of iterative reconstruction technique. CONTRAST:  75mL OMNIPAQUE  IOHEXOL  350 MG/ML SOLN COMPARISON:  Same day chest radiograph FINDINGS: Cardiovascular: Normal heart size. No significant pericardial fluid/thickening. Great vessels are normal in course and caliber. No central pulmonary emboli. Coronary artery calcifications and aortic atherosclerosis. Extensive enhancement of subcutaneous collateral vessels. Nonenhancement of the left internal  jugular vein. Mediastinum/Nodes: Leftward shift of the mediastinum. Imaged thyroid  gland without nodules meeting criteria for imaging follow-up by size. Small hiatal hernia. No pathologically enlarged axillary, supraclavicular, mediastinal, or hilar lymph nodes. Lungs/Pleura: There is marked effacement of the left upper lobe bronchus and abrupt cut off of the left lower lobe bronchus. The central airways are otherwise patent. Near-complete atelectasis of the left upper lobe with subsegmental atelectasis of the lingula. Within this area of left upper lobe atelectasis, there is masslike hypoenhancement measuring 7.1 x 2.8 cm (3:44) which demonstrates extension to the hilum (3:59). Minimal linear atelectasis at the left lower lobe base. Scattered linear atelectasis in the right middle and lower lobes. Right apical pleural-parenchymal scarring. No pneumothorax. Small left pleural effusion. Upper abdomen: Normal. Musculoskeletal: No acute or abnormal lytic or blastic osseous lesions. Multilevel degenerative changes of the thoracic spine. Rounded density in the upper inner right breast measures 1.4 x 1.1 cm (3:81). IMPRESSION: 1. Marked effacement of the left upper lobe bronchus and abrupt cut off of the left lower lobe bronchus with near-complete atelectasis of the left upper lobe and subsegmental atelectasis of the lingula. Within this area of left upper lobe atelectasis, there is masslike hypoenhancement measuring 7.1 x 2.8 cm which demonstrates extension to the hilum. Findings are suspicious for primary lung malignancy, less likely superimposed pneumonia. 2. Small left pleural effusion. 3. Nonenhancement of  the left internal jugular vein with extensive enhancement of subcutaneous collateral vessels. Recommend correlation with Doppler ultrasound examination of the left internal jugular vein. 4. Rounded density in the upper inner right breast measures 1.4 x 1.1 cm. Recommend correlation with dedicated breast imaging, if  this finding has not been previously evaluated. 5. Aortic Atherosclerosis (ICD10-I70.0). Coronary artery calcifications. Assessment for potential risk factor modification, dietary therapy or pharmacologic therapy may be warranted, if clinically indicated. Electronically Signed   By: Limin  Xu M.D.   On: 02/21/2023 16:51   DG Chest 2 View Addendum Date: 02/21/2023 ADDENDUM REPORT: 02/21/2023 14:28 ADDENDUM: Case discussed with Dorn Davis's clinical service at 2:25 p.m. Electronically Signed   By: Rockey Kilts M.D.   On: 02/21/2023 14:28   Result Date: 02/21/2023 CLINICAL DATA:  Shortness of breath and chest pain EXAM: CHEST - 2 VIEW COMPARISON:  10/06/2022 FINDINGS: Surgical clips in the low neck. Midline trachea. Normal heart size. No pleural effusion or pneumothorax. Clear right lung. Soft tissue fullness in the left suprahilar region with increased anterior density on the lateral view. Volume loss throughout the left hemithorax with new left hemidiaphragm elevation. IMPRESSION: Abnormal appearance of the left hemithorax, suspicious for central left upper lobe/suprahilar lung mass with secondary partial collapse and hemidiaphragm elevation. Recommend further evaluation with contrast enhanced chest CT. A call to the emergency room physician is pending. Electronically Signed: By: Rockey Kilts M.D. On: 02/21/2023 14:17     Past medical hx Past Medical History:  Diagnosis Date   Allergic rhinitis 05/19/2007   Qualifier: Diagnosis of   By: Mavis MD, Norleen BRAVO        Allergy    Anxiety    Arthritis    Cancer [facility]) 1997   Thyroid -Papillary   Complication of anesthesia    vomitting only   Constipation 07/13/2014   Cyst of breast, right, diffuse fibrocystic    Depression    Hypertension    Hypothyroidism    Memory change 06/25/2019   PONV (postoperative nausea and vomiting)    Pure hypercholesterolemia 12/08/2013   Smokers' cough (HCC) 12/11/2017     Social History   Tobacco Use   Smoking  status: Every Day    Current packs/day: 0.50    Average packs/day: 0.5 packs/day for 15.0 years (7.5 ttl pk-yrs)    Types: Cigarettes    Passive exposure: Past   Smokeless tobacco: Never   Tobacco comments:    Smokes 2-3 cigs per day 03/04/23  Vaping Use   Vaping status: Never Used  Substance Use Topics   Alcohol use: Yes    Comment: occ wine   Drug use: No    Ms.Whitehouse reports that she has been smoking cigarettes. She has a 7.5 pack-year smoking history. She has been exposed to tobacco smoke. She has never used smokeless tobacco. She reports current alcohol use. She reports that she does not use drugs.  Tobacco Cessation: Current every day smoker   Past surgical hx, Family hx, Social hx all reviewed.  Current Outpatient Medications on File Prior to Visit  Medication Sig   Ascorbic Acid (VITAMIN C PO) Take 1 tablet by mouth daily.   buPROPion  (WELLBUTRIN  XL) 150 MG 24 hr tablet Take 1 tablet (150 mg total) by mouth daily.   CALCIUM PO Take 1 tablet by mouth daily.   COENZYME Q-10 PO Take 1 tablet by mouth daily.   Cyanocobalamin (VITAMIN B-12 PO) Take 1 tablet by mouth daily.   DULoxetine  (CYMBALTA ) 60 MG  capsule Take 1 capsule (60 mg total) by mouth 2 (two) times daily.   ferrous sulfate 325 (65 FE) MG tablet Take 325 mg by mouth every evening.    Ginkgo Biloba (GNP GINGKO BILOBA EXTRACT PO) Take 1 tablet by mouth daily.   levothyroxine  (SYNTHROID ) 112 MCG tablet Take 1 tablet (112 mcg total) by mouth daily.   Misc Natural Products (GLUCOSAMINE CHOND COMPLEX/MSM) TABS Take 1 tablet by mouth 2 (two) times daily.   Multiple Vitamins-Minerals (MULTIVITAMIN WITH MINERALS) tablet Take 1 tablet by mouth daily.   Nutritional Supplements (DHEA PO) Take 1 tablet by mouth daily.   omeprazole  (PRILOSEC) 20 MG capsule Take 1 capsule (20 mg total) by mouth daily.   Turmeric (QC TUMERIC COMPLEX PO) Take 1 tablet by mouth daily.   No current facility-administered medications on file prior  to visit.     Allergies  Allergen Reactions   Permethrin  Rash    Review Of Systems:  Constitutional:   +  weight loss, no night sweats,  Fevers, chills,  +fatigue, or  lassitude.  HEENT:   No headaches,  Difficulty swallowing,  Tooth/dental problems, or  Sore throat,                No sneezing, itching, ear ache, nasal congestion, post nasal drip,   CV:  No chest pain,  Orthopnea, PND, swelling in lower extremities, anasarca, dizziness, palpitations, syncope.   GI  No heartburn, indigestion, abdominal pain, nausea, vomiting, diarrhea, change in bowel habits, loss of appetite, bloody stools.   Resp: + shortness of breath with exertion or at rest.  No excess mucus,  + productive cough,  No non-productive cough,  No coughing up of blood.  No change in color of mucus.  No wheezing.  No chest wall deformity  Skin: no rash or lesions.  GU: no dysuria, change in color of urine, no urgency or frequency.  No flank pain, no hematuria   MS:  No joint pain or swelling.  No decreased range of motion.  No back pain.  Psych:  No change in mood or affect. No depression or anxiety.  No memory loss.   Vital Signs BP 136/76   Pulse 68   Temp 97.8 F (36.6 C) (Oral)   SpO2 97%    Physical Exam:  General- No distress,  A&Ox3, pleasant, appropriate, anxious ENT: No sinus tenderness, TM clear, pale nasal mucosa, no oral exudate,no post nasal drip, no LAN Cardiac: S1, S2, regular rate and rhythm, no murmur Chest: No wheeze/ rales/ dullness; no accessory muscle use, no nasal flaring, no sternal retractions.  Slightly diminished per bases Abd.: Soft Non-tender, bowel sounds positive, nondistended,  Ext: No clubbing cyanosis, edema Neuro:  normal strength, moving all extremities x 4, alert and oriented x 3, appropriate Skin: No rashes, warm and dry, no obvious lesions Psych: normal mood and behavior, appropriately anxious   Assessment/Plan LUL mass 7.1 x 2.8cm with extension into the hilum  and a current every day smoker, occlusion of the left upper lobe bronchus with near complete atelectasis of LUL.  Recent weight loss, back pain and dyspnea on exertion Plan We have scheduled you for a bronchoscopy. We will provide a letter with all of the details of the procedure. I have sent in an albuterol  inhaler. Use 1-2 puffs as needed for shortness of breath or wheezing. Do not use more than 4 times daily. If you need to use more, call to be seen. You will follow up with  about 1 week after the bronchoscopy and biopsies  We will make sure you are doing well, and go over biopsy results. Call if you need us  sooner.  Please contact office for sooner follow up if symptoms do not improve or worsen or seek emergency care    Follow up and bronch instructions  SHAQUAYLA KLIMAS [address] [address] [place] [address]   Dear Ms. Ingman,  The following has been scheduled for you:   Procedure: Bronchoscopy                 Surgeon:  Dr Shelah  Procedure date:  03/11/23 at 1:30 pm Arrival Time:  11:00 am Location: [facility] [address], Entrance [address] [place], [place] [address]   Additional Information: Do not eat anything after midnight - ok to take medications on the day of     procedure with only sips of water You will need someone to drive you home after this procedure & to be with you for the next 24 hours You may receive a call from the hospital a day or two prior to procedure to go over additional instructions & medications  NEXT VISIT:  03/19/23 arrive at 10:15am                    With Lauraine Lites, NP at    [facility]    [address], [address] [place], [place]  [address]  I spent 45 minutes dedicated to the care of this patient on the date of this encounter to include pre-visit review of records, face-to-face time with the patient discussing conditions above, post visit ordering of testing, clinical documentation with the electronic health record, making appropriate  referrals as documented, and communicating necessary information to the patient's healthcare team.    Lauraine JULIANNA Lites, NP 03/05/2023  12:13 AM

## 2023-03-04 NOTE — H&P (View-Only) (Signed)
 History of Present Illness Theresa Perez is a 73 y.o. female current every day smoker with history of thyroid  papillary carcinoma, referred 02/2023 for consideration of bronchoscopy with biopsies of a newly discovered  abnormal lung mass. She will be followed by Dr. Shelah.     Synopsis Theresa Perez is a 73 year old woman, daily smoker with history of thyroid  papillary carcinoma, hypertension and anxiety/depression who presented to the ER 02/22/2023  with shortness of breath and left side chest pain.    Chest radiograph showed concern for left upper lob/suprahilar lung mass with secondary collapse of lung. CT Chest with contrast done which shows LUL mass 7.1 x 2.8cm with extension into the hilum. Occlusion of the left upper lobe bronchus with near complete atelectasis of LUL. Small left pleural effusion. Concern for non-enhancement of the left internal jugular vein with formation of collaterals concerning for occlusion or blood clots.    She smokes a couple of cigarettes per day and is around second hand smoke from her husband at times for 30+ years. She is retired, no history of chemical or dust exposures. No hemoptysis. No weight loss, fevers, chills or sweats.  She was seen by the inpatient team, and she was scheduled for a pulmonary consult to evaluate her for bronchoscopy with biopsies to better evaluate the lung mass. She is high risk with her smoking history, and second hand smoke exposure.   03/05/2023 Pt. Presents for consultation and  evaluation for bronchoscopy with biopsies of a newly discovered LUL mass 7.1 x 2.8cm with extension into the hilum. She was seen in the ED 02/22/2023 with shortness of breath and left sided chest pain. There was notation of occlusion of the left upper lobe bronchus with near complete atelectasis of LUL. Small left pleural effusion. Concern for non-enhancement of the left internal jugular vein with formation of collaterals concerning for occlusion or blood  clots, however vas ultrasound upper extremity venous duplex study done 02/22/2023 showed. No evidence of thrombosis in the right subclavian, and no evidence of deep vein thrombosis in the left upper extremity. No evidence of  superficial vein thrombosis in the left upper extremity.  Patient usually plays tennis on a regular basis, and works out.  She is very anxious to return to some kind of physical activity.  I have encouraged her to continue her physical activities but not to exert herself.  She states that she does have dyspnea on exertion.  She has been using an old albuterol  inhaler.  I have placed an order for a new albuterol  inhaler for her to use with shortness of breath or wheezing.  She understands if she needs to use this more than 4 times daily she needs to call us  to be seen.  Patient is very concerned regarding the above findings.  Plan will be for bronchoscopy with biopsies with Dr. Shelah to better evaluate the lung nodules.  She is in agreement with this plan and had no further questions or concerns at completion of the visit    She was seen by the inpatient team and referred for consult and evaluation for bronchoscopy with biopsies .  We have reviewed her CT results. She is in agreement with bronchoscopy with biopsies.. We have reviewed the risks and benefits of the procedure.  She is in agreement with to proceed with bronchoscopy and biopsies to better evaluate the process in her lung.  She is very anxious that she knows she needs to move forward to determine  diagnosis and therefore treatment.  She is still smoking, I have counseled her to quit.  We discussed the use of nicotine replacement therapy.  She was provided with the 1 800 quit NOW number for free nicotine replacement therapy.  She does continue to endorse dyspnea on exertion however the left-sided chest pain has resolved.  Test Results: CT Chest 02/21/2023  Marked effacement of the left upper lobe bronchus and abrupt cut off  of the left lower lobe bronchus with near-complete atelectasis of the left upper lobe and subsegmental atelectasis of the lingula. Within this area of left upper lobe atelectasis, there is masslike hypoenhancement measuring 7.1 x 2.8 cm which demonstrates extension to the hilum. Findings are suspicious for primary lung malignancy, less likely superimposed pneumonia. 2. Small left pleural effusion. 3. Nonenhancement of the left internal jugular vein with extensive enhancement of subcutaneous collateral vessels. Recommend correlation with Doppler ultrasound examination of the left internal jugular vein. 4. Rounded density in the upper inner right breast measures 1.4 x 1.1 cm. Recommend correlation with dedicated breast imaging, if this finding has not been previously evaluated. 5. Aortic Atherosclerosis (ICD10-I70.0). Coronary artery calcifications. Assessment for potential risk factor modification, dietary therapy or pharmacologic therapy may be warranted, if clinically indicated.  02/22/2023 Vas ultrasound upper extremity venous duplex No evidence of thrombosis in the right subclavian.  No evidence of deep vein thrombosis in the left upper extremity. No evidence of  superficial vein thrombosis in the left upper extremity     Latest Ref Rng & Units 02/22/2023    6:56 AM 02/21/2023   12:12 PM 12/31/2022    2:08 PM  CBC  WBC 4.0 - 10.5 K/uL 10.0  9.4  5.9   Hemoglobin 12.0 - 15.0 g/dL 85.9  85.7  85.8   Hematocrit 36.0 - 46.0 % 41.0  42.3  43.7   Platelets 150 - 400 K/uL 366  366  321        Latest Ref Rng & Units 02/22/2023    6:56 AM 02/21/2023   12:12 PM 12/31/2022    2:08 PM  BMP  Glucose 70 - 99 mg/dL 876  894  86   BUN 8 - 23 mg/dL 9  8  6    Creatinine 0.44 - 1.00 mg/dL 9.35  9.27  9.29   BUN/Creat Ratio 12 - 28   9   Sodium 135 - 145 mmol/L 138  137  143   Potassium 3.5 - 5.1 mmol/L 3.6  3.3  4.0   Chloride 98 - 111 mmol/L 100  99  103   CO2 22 - 32 mmol/L 27  24  26     Calcium 8.9 - 10.3 mg/dL 9.3  9.6  8.9     BNP No results found for: BNP  ProBNP No results found for: PROBNP  PFT No results found for: FEV1PRE, FEV1POST, FVCPRE, FVCPOST, TLC, DLCOUNC, PREFEV1FVCRT, PSTFEV1FVCRT  US  LIMITED JOINT SPACE STRUCTURES LOW RIGHT(NO LINKED CHARGES) Result Date: 02/25/2023 Procedure: Real-time Ultrasound Guided Injection of right knee joint superior lateral patella space Device: Philips Affiniti 50G/GE Logiq Images permanently stored and available for review in PACS Verbal informed consent obtained.  Discussed risks and benefits of procedure. Warned about infection, bleeding, hyperglycemia damage to structures among others. Patient expresses understanding and agreement Time-out conducted.  Noted no overlying erythema, induration, or other signs of local infection.  Skin prepped in a sterile fashion.  Local anesthesia: Topical Ethyl chloride.  With sterile technique and under real time ultrasound guidance:  40 mg of Kenalog  and 2 mL of Marcaine  injected into knee joint. Fluid seen entering the joint capsule.  Completed without difficulty  Pain immediately resolved suggesting accurate placement of the medication.  Advised to call if fevers/chills, erythema, induration, drainage, or persistent bleeding.  Images permanently stored and available for review in the ultrasound unit. Impression: Technically successful ultrasound guided injection.    VAS US  UPPER EXTREMITY VENOUS DUPLEX Result Date: 02/23/2023 UPPER VENOUS STUDY  Patient Name:  SAMIRA ACERO  Date of Exam:   02/22/2023 Medical Rec #: [id number]         Accession #:    [id number] Date of Birth: August 19, 1950         Patient Gender: F Patient Age:   56 years Exam Location:  [facility] Procedure:      VAS US  UPPER EXTREMITY VENOUS DUPLEX Referring Phys: TIMOTHY OPYD --------------------------------------------------------------------------------  Indications: Enhancement of left internal jugular  vein noted in CT of chest. New lung mass, measuring 7.1 cm X 2.8 cm noted in the left upper lobe extending into the hilum. Risk Factors: Cancer Papillary thyroid  carcinoma status post resection 1997. Limitations: Line and musculoskeletal features. Comparison Study: No prior upper extremity venous duplex on file Performing Technologist: Alberta Lis RVS  Examination Guidelines: A complete evaluation includes B-mode imaging, spectral Doppler, color Doppler, and power Doppler as needed of all accessible portions of each vessel. Bilateral testing is considered an integral part of a complete examination. Limited examinations for reoccurring indications may be performed as noted.  Right Findings: +----------+------------+---------+-----------+----------+-------+ RIGHT     CompressiblePhasicitySpontaneousPropertiesSummary +----------+------------+---------+-----------+----------+-------+ IJV           Full       Yes       Yes                      +----------+------------+---------+-----------+----------+-------+ Subclavian               Yes       Yes                      +----------+------------+---------+-----------+----------+-------+  Left Findings: +----------+------------+---------+-----------+----------+-------+ LEFT      CompressiblePhasicitySpontaneousPropertiesSummary +----------+------------+---------+-----------+----------+-------+ IJV           Full       Yes       Yes                      +----------+------------+---------+-----------+----------+-------+ Subclavian               Yes       Yes                      +----------+------------+---------+-----------+----------+-------+ Axillary                 Yes       Yes                      +----------+------------+---------+-----------+----------+-------+ Brachial      Full       Yes       Yes                      +----------+------------+---------+-----------+----------+-------+ Radial        Full                                           +----------+------------+---------+-----------+----------+-------+  Ulnar         Full                                          +----------+------------+---------+-----------+----------+-------+ Cephalic      Full                                          +----------+------------+---------+-----------+----------+-------+ Basilic       Full                                          +----------+------------+---------+-----------+----------+-------+ Left internal jugular vein appears patent. The mid to distal portion appears to be tortuous, crossing behind the CCA.  Summary:  Right: No evidence of thrombosis in the subclavian.  Left: No evidence of deep vein thrombosis in the upper extremity. No evidence of superficial vein thrombosis in the upper extremity.  *See table(s) above for measurements and observations.  Diagnosing physician: Norman Serve Electronically signed by Norman Serve on 02/23/2023 at 9:51:13 AM.    Final    CT Chest W Contrast Result Date: 02/21/2023 CLINICAL DATA:  Shortness of breath and chest pain with left lung atelectasis on same day chest radiograph EXAM: CT CHEST WITH CONTRAST TECHNIQUE: Multidetector CT imaging of the chest was performed during intravenous contrast administration. RADIATION DOSE REDUCTION: This exam was performed according to the departmental dose-optimization program which includes automated exposure control, adjustment of the mA and/or kV according to patient size and/or use of iterative reconstruction technique. CONTRAST:  75mL OMNIPAQUE  IOHEXOL  350 MG/ML SOLN COMPARISON:  Same day chest radiograph FINDINGS: Cardiovascular: Normal heart size. No significant pericardial fluid/thickening. Great vessels are normal in course and caliber. No central pulmonary emboli. Coronary artery calcifications and aortic atherosclerosis. Extensive enhancement of subcutaneous collateral vessels. Nonenhancement of the left internal  jugular vein. Mediastinum/Nodes: Leftward shift of the mediastinum. Imaged thyroid  gland without nodules meeting criteria for imaging follow-up by size. Small hiatal hernia. No pathologically enlarged axillary, supraclavicular, mediastinal, or hilar lymph nodes. Lungs/Pleura: There is marked effacement of the left upper lobe bronchus and abrupt cut off of the left lower lobe bronchus. The central airways are otherwise patent. Near-complete atelectasis of the left upper lobe with subsegmental atelectasis of the lingula. Within this area of left upper lobe atelectasis, there is masslike hypoenhancement measuring 7.1 x 2.8 cm (3:44) which demonstrates extension to the hilum (3:59). Minimal linear atelectasis at the left lower lobe base. Scattered linear atelectasis in the right middle and lower lobes. Right apical pleural-parenchymal scarring. No pneumothorax. Small left pleural effusion. Upper abdomen: Normal. Musculoskeletal: No acute or abnormal lytic or blastic osseous lesions. Multilevel degenerative changes of the thoracic spine. Rounded density in the upper inner right breast measures 1.4 x 1.1 cm (3:81). IMPRESSION: 1. Marked effacement of the left upper lobe bronchus and abrupt cut off of the left lower lobe bronchus with near-complete atelectasis of the left upper lobe and subsegmental atelectasis of the lingula. Within this area of left upper lobe atelectasis, there is masslike hypoenhancement measuring 7.1 x 2.8 cm which demonstrates extension to the hilum. Findings are suspicious for primary lung malignancy, less likely superimposed pneumonia. 2. Small left pleural effusion. 3. Nonenhancement of  the left internal jugular vein with extensive enhancement of subcutaneous collateral vessels. Recommend correlation with Doppler ultrasound examination of the left internal jugular vein. 4. Rounded density in the upper inner right breast measures 1.4 x 1.1 cm. Recommend correlation with dedicated breast imaging, if  this finding has not been previously evaluated. 5. Aortic Atherosclerosis (ICD10-I70.0). Coronary artery calcifications. Assessment for potential risk factor modification, dietary therapy or pharmacologic therapy may be warranted, if clinically indicated. Electronically Signed   By: Limin  Xu M.D.   On: 02/21/2023 16:51   DG Chest 2 View Addendum Date: 02/21/2023 ADDENDUM REPORT: 02/21/2023 14:28 ADDENDUM: Case discussed with Dorn Davis's clinical service at 2:25 p.m. Electronically Signed   By: Rockey Kilts M.D.   On: 02/21/2023 14:28   Result Date: 02/21/2023 CLINICAL DATA:  Shortness of breath and chest pain EXAM: CHEST - 2 VIEW COMPARISON:  10/06/2022 FINDINGS: Surgical clips in the low neck. Midline trachea. Normal heart size. No pleural effusion or pneumothorax. Clear right lung. Soft tissue fullness in the left suprahilar region with increased anterior density on the lateral view. Volume loss throughout the left hemithorax with new left hemidiaphragm elevation. IMPRESSION: Abnormal appearance of the left hemithorax, suspicious for central left upper lobe/suprahilar lung mass with secondary partial collapse and hemidiaphragm elevation. Recommend further evaluation with contrast enhanced chest CT. A call to the emergency room physician is pending. Electronically Signed: By: Rockey Kilts M.D. On: 02/21/2023 14:17     Past medical hx Past Medical History:  Diagnosis Date   Allergic rhinitis 05/19/2007   Qualifier: Diagnosis of   By: Mavis MD, Norleen BRAVO        Allergy    Anxiety    Arthritis    Cancer [facility]) 1997   Thyroid -Papillary   Complication of anesthesia    vomitting only   Constipation 07/13/2014   Cyst of breast, right, diffuse fibrocystic    Depression    Hypertension    Hypothyroidism    Memory change 06/25/2019   PONV (postoperative nausea and vomiting)    Pure hypercholesterolemia 12/08/2013   Smokers' cough (HCC) 12/11/2017     Social History   Tobacco Use   Smoking  status: Every Day    Current packs/day: 0.50    Average packs/day: 0.5 packs/day for 15.0 years (7.5 ttl pk-yrs)    Types: Cigarettes    Passive exposure: Past   Smokeless tobacco: Never   Tobacco comments:    Smokes 2-3 cigs per day 03/04/23  Vaping Use   Vaping status: Never Used  Substance Use Topics   Alcohol use: Yes    Comment: occ wine   Drug use: No    Ms.Whitehouse reports that she has been smoking cigarettes. She has a 7.5 pack-year smoking history. She has been exposed to tobacco smoke. She has never used smokeless tobacco. She reports current alcohol use. She reports that she does not use drugs.  Tobacco Cessation: Current every day smoker   Past surgical hx, Family hx, Social hx all reviewed.  Current Outpatient Medications on File Prior to Visit  Medication Sig   Ascorbic Acid (VITAMIN C PO) Take 1 tablet by mouth daily.   buPROPion  (WELLBUTRIN  XL) 150 MG 24 hr tablet Take 1 tablet (150 mg total) by mouth daily.   CALCIUM PO Take 1 tablet by mouth daily.   COENZYME Q-10 PO Take 1 tablet by mouth daily.   Cyanocobalamin (VITAMIN B-12 PO) Take 1 tablet by mouth daily.   DULoxetine  (CYMBALTA ) 60 MG  capsule Take 1 capsule (60 mg total) by mouth 2 (two) times daily.   ferrous sulfate 325 (65 FE) MG tablet Take 325 mg by mouth every evening.    Ginkgo Biloba (GNP GINGKO BILOBA EXTRACT PO) Take 1 tablet by mouth daily.   levothyroxine  (SYNTHROID ) 112 MCG tablet Take 1 tablet (112 mcg total) by mouth daily.   Misc Natural Products (GLUCOSAMINE CHOND COMPLEX/MSM) TABS Take 1 tablet by mouth 2 (two) times daily.   Multiple Vitamins-Minerals (MULTIVITAMIN WITH MINERALS) tablet Take 1 tablet by mouth daily.   Nutritional Supplements (DHEA PO) Take 1 tablet by mouth daily.   omeprazole  (PRILOSEC) 20 MG capsule Take 1 capsule (20 mg total) by mouth daily.   Turmeric (QC TUMERIC COMPLEX PO) Take 1 tablet by mouth daily.   No current facility-administered medications on file prior  to visit.     Allergies  Allergen Reactions   Permethrin  Rash    Review Of Systems:  Constitutional:   +  weight loss, no night sweats,  Fevers, chills,  +fatigue, or  lassitude.  HEENT:   No headaches,  Difficulty swallowing,  Tooth/dental problems, or  Sore throat,                No sneezing, itching, ear ache, nasal congestion, post nasal drip,   CV:  No chest pain,  Orthopnea, PND, swelling in lower extremities, anasarca, dizziness, palpitations, syncope.   GI  No heartburn, indigestion, abdominal pain, nausea, vomiting, diarrhea, change in bowel habits, loss of appetite, bloody stools.   Resp: + shortness of breath with exertion or at rest.  No excess mucus,  + productive cough,  No non-productive cough,  No coughing up of blood.  No change in color of mucus.  No wheezing.  No chest wall deformity  Skin: no rash or lesions.  GU: no dysuria, change in color of urine, no urgency or frequency.  No flank pain, no hematuria   MS:  No joint pain or swelling.  No decreased range of motion.  No back pain.  Psych:  No change in mood or affect. No depression or anxiety.  No memory loss.   Vital Signs BP 136/76   Pulse 68   Temp 97.8 F (36.6 C) (Oral)   SpO2 97%    Physical Exam:  General- No distress,  A&Ox3, pleasant, appropriate, anxious ENT: No sinus tenderness, TM clear, pale nasal mucosa, no oral exudate,no post nasal drip, no LAN Cardiac: S1, S2, regular rate and rhythm, no murmur Chest: No wheeze/ rales/ dullness; no accessory muscle use, no nasal flaring, no sternal retractions.  Slightly diminished per bases Abd.: Soft Non-tender, bowel sounds positive, nondistended,  Ext: No clubbing cyanosis, edema Neuro:  normal strength, moving all extremities x 4, alert and oriented x 3, appropriate Skin: No rashes, warm and dry, no obvious lesions Psych: normal mood and behavior, appropriately anxious   Assessment/Plan LUL mass 7.1 x 2.8cm with extension into the hilum  and a current every day smoker, occlusion of the left upper lobe bronchus with near complete atelectasis of LUL.  Recent weight loss, back pain and dyspnea on exertion Plan We have scheduled you for a bronchoscopy. We will provide a letter with all of the details of the procedure. I have sent in an albuterol  inhaler. Use 1-2 puffs as needed for shortness of breath or wheezing. Do not use more than 4 times daily. If you need to use more, call to be seen. You will follow up with  about 1 week after the bronchoscopy and biopsies  We will make sure you are doing well, and go over biopsy results. Call if you need us  sooner.  Please contact office for sooner follow up if symptoms do not improve or worsen or seek emergency care    Follow up and bronch instructions  SHAQUAYLA KLIMAS [address] [address] [place] [address]   Dear Ms. Ingman,  The following has been scheduled for you:   Procedure: Bronchoscopy                 Surgeon:  Dr Shelah  Procedure date:  03/11/23 at 1:30 pm Arrival Time:  11:00 am Location: [facility] [address], Entrance [address] [place], [place] [address]   Additional Information: Do not eat anything after midnight - ok to take medications on the day of     procedure with only sips of water You will need someone to drive you home after this procedure & to be with you for the next 24 hours You may receive a call from the hospital a day or two prior to procedure to go over additional instructions & medications  NEXT VISIT:  03/19/23 arrive at 10:15am                    With Lauraine Lites, NP at    [facility]    [address], [address] [place], [place]  [address]  I spent 45 minutes dedicated to the care of this patient on the date of this encounter to include pre-visit review of records, face-to-face time with the patient discussing conditions above, post visit ordering of testing, clinical documentation with the electronic health record, making appropriate  referrals as documented, and communicating necessary information to the patient's healthcare team.    Lauraine JULIANNA Lites, NP 03/05/2023  12:13 AM

## 2023-03-04 NOTE — Patient Instructions (Signed)
 It is good to see you today. We have scheduled you for a bronchoscopy. We will provide a letter with all of the details of the procedure. I have sent in an albuterol  inhaler. Use 1-2 puffs as needed for shortness of breath or wheezing. Do not use more than 4 times daily. If you need to use more, call to be seen. You will follow up with about 1 week after the bronchoscopy and biopsies  We will make sure you are doing well, and go over biopsy results. Call if you need us  sooner.  Please contact office for sooner follow up if symptoms do not improve or worsen or seek emergency care

## 2023-03-05 ENCOUNTER — Telehealth: Payer: Self-pay | Admitting: Internal Medicine

## 2023-03-05 ENCOUNTER — Other Ambulatory Visit: Payer: Self-pay | Admitting: Internal Medicine

## 2023-03-05 ENCOUNTER — Encounter: Payer: Self-pay | Admitting: Acute Care

## 2023-03-05 DIAGNOSIS — N83209 Unspecified ovarian cyst, unspecified side: Secondary | ICD-10-CM

## 2023-03-05 DIAGNOSIS — Z1231 Encounter for screening mammogram for malignant neoplasm of breast: Secondary | ICD-10-CM

## 2023-03-05 NOTE — Telephone Encounter (Signed)
Copied from CRM [id number]. Topic: General - Other >> Mar 05, 2023 10:54 AM Denese Killings wrote: Reason for CRM: Patient wants an order from Dr. Okey Dupre sent over to [facility] in [place] to have another ultrasound done on right ovary.

## 2023-03-07 DIAGNOSIS — N83209 Unspecified ovarian cyst, unspecified side: Secondary | ICD-10-CM | POA: Insufficient documentation

## 2023-03-07 NOTE — Telephone Encounter (Signed)
Order placed

## 2023-03-10 ENCOUNTER — Encounter (HOSPITAL_COMMUNITY): Payer: Self-pay | Admitting: Emergency Medicine

## 2023-03-10 NOTE — Progress Notes (Signed)
SDW call  Patient was given pre-op instructions over the phone. Patient verbalized understanding of instructions provided.     PCP - Dr. Hillard Danker Cardiologist -  Pulmonary:    PPM/ICD - denies Device Orders - na Rep Notified - na   Chest x-ray - 02/21/2023 EKG -  02/21/2023 Stress Test - ECHO -  Cardiac Cath -   Sleep Study/sleep apnea/CPAP: denies  Non-diabetic  Blood Thinner Instructions: denies Aspirin Instructions:denies   ERAS Protcol - NPO   Anesthesia review: No   Patient denies shortness of breath, fever, cough and chest pain over the phone call  Your procedure is scheduled on Tuesday, March 11, 2023  Report to [facility] Main Entrance "A" at  1100  A.M., then check in with the Admitting office.  Call this number if you have problems the morning of surgery:  [phone]   If you have any questions prior to your surgery date call [phone]: Open Monday-Friday 8am-4pm If you experience any cold or flu symptoms such as cough, fever, chills, shortness of breath, etc. between now and your scheduled surgery, please notify us at the above number    Remember:  Do not eat or drink after midnight the night before your surgery  Take these medicines the morning of surgery with A SIP OF WATER:  Wellbutrin, cymbalta, levothyroxine, prilosec  As needed: albuterol  As of today, STOP taking any Aspirin (unless otherwise instructed by your surgeon) Aleve, Naproxen, Ibuprofen, Motrin, Advil, Goody's, BC's, all herbal medications, fish oil, and all vitamins.

## 2023-03-11 ENCOUNTER — Other Ambulatory Visit: Payer: Self-pay

## 2023-03-11 ENCOUNTER — Encounter (HOSPITAL_COMMUNITY)
Admission: RE | Disposition: A | Discharge status: Home / Self Care | Payer: Self-pay | Source: Home / Self Care | Attending: Emergency Medicine

## 2023-03-11 ENCOUNTER — Ambulatory Visit (HOSPITAL_BASED_OUTPATIENT_CLINIC_OR_DEPARTMENT_OTHER): Payer: Medicare PPO

## 2023-03-11 ENCOUNTER — Ambulatory Visit (HOSPITAL_COMMUNITY): Payer: Medicare PPO

## 2023-03-11 ENCOUNTER — Ambulatory Visit (HOSPITAL_COMMUNITY)
Admission: RE | Admit: 2023-03-11 | Discharge: 2023-03-11 | Disposition: A | Discharge status: Home / Self Care | Payer: Medicare PPO | Attending: Emergency Medicine | Admitting: Emergency Medicine

## 2023-03-11 ENCOUNTER — Encounter (HOSPITAL_COMMUNITY): Payer: Self-pay | Admitting: Emergency Medicine

## 2023-03-11 DIAGNOSIS — F32A Depression, unspecified: Secondary | ICD-10-CM | POA: Diagnosis not present

## 2023-03-11 DIAGNOSIS — Z48813 Encounter for surgical aftercare following surgery on the respiratory system: Secondary | ICD-10-CM | POA: Diagnosis not present

## 2023-03-11 DIAGNOSIS — R918 Other nonspecific abnormal finding of lung field: Secondary | ICD-10-CM

## 2023-03-11 DIAGNOSIS — J90 Pleural effusion, not elsewhere classified: Secondary | ICD-10-CM | POA: Insufficient documentation

## 2023-03-11 DIAGNOSIS — M1990 Unspecified osteoarthritis, unspecified site: Secondary | ICD-10-CM | POA: Diagnosis not present

## 2023-03-11 DIAGNOSIS — F17210 Nicotine dependence, cigarettes, uncomplicated: Secondary | ICD-10-CM | POA: Insufficient documentation

## 2023-03-11 DIAGNOSIS — F419 Anxiety disorder, unspecified: Secondary | ICD-10-CM | POA: Insufficient documentation

## 2023-03-11 DIAGNOSIS — Z85850 Personal history of malignant neoplasm of thyroid: Secondary | ICD-10-CM | POA: Diagnosis not present

## 2023-03-11 DIAGNOSIS — J9819 Other pulmonary collapse: Secondary | ICD-10-CM

## 2023-03-11 DIAGNOSIS — E039 Hypothyroidism, unspecified: Secondary | ICD-10-CM | POA: Insufficient documentation

## 2023-03-11 DIAGNOSIS — I10 Essential (primary) hypertension: Secondary | ICD-10-CM | POA: Diagnosis not present

## 2023-03-11 DIAGNOSIS — J9811 Atelectasis: Secondary | ICD-10-CM | POA: Insufficient documentation

## 2023-03-11 HISTORY — PX: BRONCHIAL NEEDLE ASPIRATION BIOPSY: SHX5106

## 2023-03-11 HISTORY — PX: BRONCHIAL BIOPSY: SHX5109

## 2023-03-11 HISTORY — PX: BRONCHIAL BRUSHINGS: SHX5108

## 2023-03-11 SURGERY — BRONCHOSCOPY, WITH BIOPSY USING ELECTROMAGNETIC NAVIGATION
Anesthesia: General | Laterality: Left

## 2023-03-11 MED ORDER — FENTANYL CITRATE (PF) 250 MCG/5ML IJ SOLN
INTRAMUSCULAR | Status: DC | PRN
  Administered 2023-03-11 (×2): 50 ug via INTRAVENOUS

## 2023-03-11 MED ORDER — LABETALOL HCL 5 MG/ML IV SOLN
INTRAVENOUS | Status: DC | PRN
Start: 2023-03-11 — End: 2023-03-11
  Administered 2023-03-11 (×2): 5 mg via INTRAVENOUS

## 2023-03-11 MED ORDER — CHLORHEXIDINE GLUCONATE 0.12 % MT SOLN
15.0000 mL | Freq: Once | OROMUCOSAL | Status: AC
  Administered 2023-03-11: 15 mL via OROMUCOSAL
  Filled 2023-03-11: qty 15

## 2023-03-11 MED ORDER — PROPOFOL 10 MG/ML IV BOLUS
INTRAVENOUS | Status: DC | PRN
  Administered 2023-03-11: 30 mg via INTRAVENOUS
  Administered 2023-03-11: 120 mg via INTRAVENOUS

## 2023-03-11 MED ORDER — PROPOFOL 500 MG/50ML IV EMUL
INTRAVENOUS | Status: DC | PRN
  Administered 2023-03-11: 100 ug/kg/min via INTRAVENOUS

## 2023-03-11 MED ORDER — DEXAMETHASONE SODIUM PHOSPHATE 10 MG/ML IJ SOLN
INTRAMUSCULAR | Status: DC | PRN
  Administered 2023-03-11: 10 mg via INTRAVENOUS

## 2023-03-11 MED ORDER — SODIUM CHLORIDE 0.9 % IV SOLN: INTRAVENOUS | Status: DC

## 2023-03-11 MED ORDER — ROCURONIUM BROMIDE 10 MG/ML (PF) SYRINGE
PREFILLED_SYRINGE | INTRAVENOUS | Status: DC | PRN
  Administered 2023-03-11: 50 mg via INTRAVENOUS

## 2023-03-11 MED ORDER — ONDANSETRON HCL 4 MG/2ML IJ SOLN
INTRAMUSCULAR | Status: DC | PRN
  Administered 2023-03-11: 4 mg via INTRAVENOUS

## 2023-03-11 MED ORDER — LIDOCAINE 2% (20 MG/ML) 5 ML SYRINGE
INTRAMUSCULAR | Status: DC | PRN
  Administered 2023-03-11: 60 mg via INTRAVENOUS

## 2023-03-11 MED ORDER — PHENYLEPHRINE 80 MCG/ML (10ML) SYRINGE FOR IV PUSH (FOR BLOOD PRESSURE SUPPORT)
PREFILLED_SYRINGE | INTRAVENOUS | Status: DC | PRN
  Administered 2023-03-11 (×2): 160 ug via INTRAVENOUS

## 2023-03-11 MED ORDER — SUGAMMADEX SODIUM 200 MG/2ML IV SOLN
INTRAVENOUS | Status: DC | PRN
  Administered 2023-03-11: 200 mg via INTRAVENOUS

## 2023-03-11 MED ORDER — FENTANYL CITRATE (PF) 100 MCG/2ML IJ SOLN
INTRAMUSCULAR | Status: AC
  Filled 2023-03-11: qty 2

## 2023-03-11 NOTE — Anesthesia Preprocedure Evaluation (Signed)
Anesthesia Evaluation  Patient identified by MRN, date of birth, ID band Patient awake    Reviewed: Allergy & Precautions, H&P , NPO status , Patient's Chart, lab work & pertinent test results  History of Anesthesia Complications (+) PONV and history of anesthetic complications  Airway Mallampati: II  TM Distance: >3 FB Neck ROM: Full    Dental no notable dental hx.    Pulmonary Current Smoker LUL mass   Pulmonary exam normal breath sounds clear to auscultation       Cardiovascular hypertension, negative cardio ROS Normal cardiovascular exam Rhythm:Regular Rate:Normal     Neuro/Psych  PSYCHIATRIC DISORDERS Anxiety Depression    negative neurological ROS     GI/Hepatic negative GI ROS, Neg liver ROS,,,  Endo/Other  Hypothyroidism    Renal/GU negative Renal ROS  negative genitourinary   Musculoskeletal  (+) Arthritis ,    Abdominal   Peds negative pediatric ROS (+)  Hematology negative hematology ROS (+)   Anesthesia Other Findings   Reproductive/Obstetrics negative OB ROS                             Anesthesia Physical Anesthesia Plan  ASA: 2  Anesthesia Plan: General   Post-op Pain Management:    Induction: Intravenous  PONV Risk Score and Plan: 2 and Ondansetron and Dexamethasone  Airway Management Planned: Oral ETT  Additional Equipment:   Intra-op Plan:   Post-operative Plan: Extubation in OR  Informed Consent: I have reviewed the patients History and Physical, chart, labs and discussed the procedure including the risks, benefits and alternatives for the proposed anesthesia with the patient or authorized representative who has indicated his/her understanding and acceptance.     Dental advisory given  Plan Discussed with: CRNA  Anesthesia Plan Comments:        Anesthesia Quick Evaluation

## 2023-03-11 NOTE — Discharge Instructions (Signed)
 Flexible Bronchoscopy, Care After This sheet gives you information about how to care for yourself after your test. Your doctor may also give you more specific instructions. If you have problems or questions, contact your doctor. Follow these instructions at home: Eating and drinking When your numbness is gone and your cough and gag reflexes have come back, you may: Eat only soft foods. Slowly drink liquids. When you get home after the test, go back to your normal diet. Driving Do not drive for 24 hours if you were given a medicine to help you relax (sedative). Do not drive or use heavy machinery while taking prescription pain medicine. General instructions  Take over-the-counter and prescription medicines only as told by your doctor. Return to your normal activities as told. Ask what activities are safe for you. Do not use any products that have nicotine or tobacco in them. This includes cigarettes and e-cigarettes. If you need help quitting, ask your doctor. Keep all follow-up visits as told by your doctor. This is important. It is very important if you had a tissue sample (biopsy) taken. Get help right away if: You have shortness of breath that gets worse. You get light-headed. You feel like you are going to pass out (faint). You have chest pain. You cough up: More than a little blood. More blood than before. Summary Do not eat or drink anything (not even water) for 2 hours after your test, or until your numbing medicine wears off. Do not use cigarettes. Do not use e-cigarettes. Get help right away if you have chest pain.  Please call our office for any questions or concerns.  [phone].  This information is not intended to replace advice given to you by your health care provider. Make sure you discuss any questions you have with your health care provider. Document Released: 12/02/2008 Document Revised: 01/17/2017 Document Reviewed: 02/23/2016 Elsevier Patient Education  2020  [employer].

## 2023-03-11 NOTE — Anesthesia Postprocedure Evaluation (Signed)
Anesthesia Post Note  Patient: Theresa Perez  Procedure(s) Performed: ROBOTIC ASSISTED NAVIGATIONAL BRONCHOSCOPY (Left) BRONCHIAL BRUSHINGS BRONCHIAL NEEDLE ASPIRATION BIOPSIES BRONCHIAL BIOPSIES     Patient location during evaluation: PACU Anesthesia Type: General Level of consciousness: awake and alert Pain management: pain level controlled Vital Signs Assessment: post-procedure vital signs reviewed and stable Respiratory status: spontaneous breathing, nonlabored ventilation, respiratory function stable and patient connected to nasal cannula oxygen Cardiovascular status: blood pressure returned to baseline and stable Postop Assessment: no apparent nausea or vomiting Anesthetic complications: no   No notable events documented.  Last Vitals:  Vitals:   03/11/23 1540 03/11/23 1544  BP: (!) 137/93 135/88  Pulse: 69 66  Resp: (!) 21 20  Temp:    SpO2: 96% 93%    Last Pain:  Vitals:   03/11/23 1544  TempSrc:   PainSc: 0-No pain                 [place]

## 2023-03-11 NOTE — Interval H&P Note (Signed)
History and Physical Interval Note:  03/11/2023 1:55 PM  Theresa Perez  has presented today for surgery, with the diagnosis of LEFT UPPER LOBE LUNG MASS.  The various methods of treatment have been discussed with the patient and family. After consideration of risks, benefits and other options for treatment, the patient has consented to  Procedure(s): ROBOTIC ASSISTED NAVIGATIONAL BRONCHOSCOPY (Left) as a surgical intervention.  The patient's history has been reviewed, patient examined, no change in status, stable for surgery.  I have reviewed the patient's chart and labs.  Questions were answered to the patient's satisfaction.     Leslye Peer

## 2023-03-11 NOTE — Op Note (Signed)
Video Bronchoscopy with Robotic Assisted Bronchoscopic Navigation   Date of Operation: 03/11/2023   Pre-op Diagnosis: Left upper lobe collapse  Post-op Diagnosis: Same  Surgeon: Levy Pupa  Assistants: None  Anesthesia: General endotracheal anesthesia  Operation: Flexible video fiberoptic bronchoscopy with robotic assistance and biopsies.  Estimated Blood Loss: Minimal  Complications: None  Indications and History: Theresa Perez is a 73 y.o. female with history of tobacco use, hypertension, papillary carcinoma of the thyroid.  She was admitted to the hospital in early January for dyspnea and chest discomfort.  CT scan of the chest showed a left upper lobe opacity consistent with lung collapse and concerning for possible associated mass lesion extending into the hilum.  There was apparent left upper lobe bronchial occlusion.  Recommendation made to achieve a tissue diagnosis via robotic assisted navigational bronchoscopy. The risks, benefits, complications, treatment options and expected outcomes were discussed with the patient.  The possibilities of pneumothorax, pneumonia, reaction to medication, pulmonary aspiration, perforation of a viscus, bleeding, failure to diagnose a condition and creating a complication requiring transfusion or operation were discussed with the patient who freely signed the consent.    Description of Procedure: The patient was seen in the Preoperative Area, was examined and was deemed appropriate to proceed.  The patient was taken to [facility] endoscopy room 3, identified as Theresa Perez and the procedure verified as Flexible Video Fiberoptic Bronchoscopy.  A Time Out was held and the above information confirmed.   Prior to the date of the procedure a high-resolution CT scan of the chest was performed. Utilizing ION software program a virtual tracheobronchial tree was generated to allow the creation of distinct navigation pathways to the patient's parenchymal  abnormalities. After being taken to the operating room general anesthesia was initiated and the patient  was orally intubated. The video fiberoptic bronchoscope was introduced via the endotracheal tube and a general inspection was performed which showed normal right and left lung anatomy.  In particular, all of the left upper lobe airways were patent and normal in appearance.  There were no endobronchial lesions seen.  There was some white mucus that was easily suctioned from apical segment.  Aspiration of the bilateral mainstems was completed to remove any remaining secretions. Robotic catheter inserted into patient's endotracheal tube.   Target #1 left upper lobe opacity: The distinct navigation pathways prepared prior to this procedure were then utilized to navigate to patient's lesion identified on CT scan. The robotic catheter was secured into place and the vision probe was withdrawn.  Lesion location was approximated using fluoroscopy and then Cios-spin imaging was performed to better characterize left upper lobe opacity and localize robotic scope.  The cone beam imaging confirmed that there was significant reinflation of the left upper lobe compared with the planning CT chest, but with a persistent wedge-shaped left apical opacity most consistent with persistent collapse.  This opacity was targeted for sampling.  Again no endobronchial lesions were seen in the subsegmental airways during navigation. Under fluoroscopic guidance transbronchial needle brushings, transbronchial needle biopsies, and transbronchial forceps biopsies were performed to be sent for cytology and pathology. A bronchioalveolar lavage was performed in the left upper lobe and sent for microbiology  At the end of the procedure a general airway inspection was performed and there was no evidence of active bleeding. The bronchoscope was removed.  The patient tolerated the procedure well. There was no significant blood loss and there were  no obvious complications. A post-procedural chest x-ray is  pending.  Samples Target #1: 1. Transbronchial needle brushings from left upper lobe opacity 2. Transbronchial Wang needle biopsies from left upper lobe opacity 3. Transbronchial forceps biopsies from left upper lobe opacity 4. Bronchoalveolar lavage from left upper lobe   Plans:  The patient will be discharged from the PACU to home when recovered from anesthesia and after chest x-ray is reviewed. We will review the cytology, pathology and microbiology results with the patient when they become available. Outpatient followup will be with Saralyn Pilar, NP.    Levy Pupa, MD, PhD 03/11/2023, 3:06 PM [facility] [phone] or if no answer before 7:00PM call [phone] For any issues after 7:00PM please call eLink [phone]

## 2023-03-11 NOTE — Transfer of Care (Signed)
Immediate Anesthesia Transfer of Care Note  Patient: Theresa Perez  Procedure(s) Performed: ROBOTIC ASSISTED NAVIGATIONAL BRONCHOSCOPY (Left) BRONCHIAL BRUSHINGS BRONCHIAL NEEDLE ASPIRATION BIOPSIES BRONCHIAL BIOPSIES  Patient Location: PACU  Anesthesia Type:General  Level of Consciousness: awake, alert , and oriented  Airway & Oxygen Therapy: Patient Spontanous Breathing and Patient connected to face mask oxygen  Post-op Assessment: Report given to RN and Post -op Vital signs reviewed and stable  Post vital signs: Reviewed and stable  Last Vitals:  Vitals Value Taken Time  BP 153/93 03/11/23 1516  Temp    Pulse 73 03/11/23 1520  Resp 20 03/11/23 1520  SpO2 95 % 03/11/23 1520  Vitals shown include unfiled device data.  Last Pain:  Vitals:   03/11/23 1127  TempSrc:   PainSc: 0-No pain      Patients Stated Pain Goal: 0 (03/11/23 1127)  Complications: No notable events documented.

## 2023-03-11 NOTE — Anesthesia Procedure Notes (Signed)
Procedure Name: Intubation Date/Time: 03/11/2023 2:13 PM  Performed by: Thomasene Ripple, CRNAPre-anesthesia Checklist: Patient identified, Emergency Drugs available, Suction available and Patient being monitored Patient Re-evaluated:Patient Re-evaluated prior to induction Oxygen Delivery Method: Circle System Utilized Preoxygenation: Pre-oxygenation with 100% oxygen Induction Type: IV induction Ventilation: Mask ventilation without difficulty Laryngoscope Size: Miller and 2 Grade View: Grade I Tube type: Oral Number of attempts: 2 Airway Equipment and Method: Stylet and Oral airway Placement Confirmation: ETT inserted through vocal cords under direct vision, positive ETCO2 and breath sounds checked- equal and bilateral Secured at: 21 cm Tube secured with: Tape Dental Injury: Teeth and Oropharynx as per pre-operative assessment

## 2023-03-13 LAB — ACID FAST SMEAR (AFB, MYCOBACTERIA): Acid Fast Smear: NEGATIVE

## 2023-03-13 LAB — CULTURE, BAL-QUANTITATIVE W GRAM STAIN: Culture: NO GROWTH — AB

## 2023-03-14 ENCOUNTER — Encounter (HOSPITAL_COMMUNITY): Payer: Self-pay | Admitting: Emergency Medicine

## 2023-03-14 LAB — CYTOLOGY - NON PAP

## 2023-03-16 LAB — ANAEROBIC CULTURE W GRAM STAIN

## 2023-03-19 ENCOUNTER — Ambulatory Visit: Payer: Medicare PPO | Admitting: Acute Care

## 2023-03-19 ENCOUNTER — Encounter: Payer: Self-pay | Admitting: Acute Care

## 2023-03-19 VITALS — BP 130/84 | HR 76 | Ht 69.75 in | Wt 187.8 lb

## 2023-03-19 DIAGNOSIS — F17200 Nicotine dependence, unspecified, uncomplicated: Secondary | ICD-10-CM

## 2023-03-19 DIAGNOSIS — F17210 Nicotine dependence, cigarettes, uncomplicated: Secondary | ICD-10-CM

## 2023-03-19 DIAGNOSIS — R918 Other nonspecific abnormal finding of lung field: Secondary | ICD-10-CM | POA: Diagnosis not present

## 2023-03-19 NOTE — Progress Notes (Unsigned)
History of Present Illness Theresa Perez is a 73 y.o. female  female current every day smoker with history of thyroid papillary carcinoma, referred 02/2023 for consideration of bronchoscopy with biopsies of a newly discovered  abnormal lung mass. She will be followed by Dr. Delton Coombes.    Synopsis Theresa Perez is a 73 year old woman, daily smoker with history of thyroid papillary carcinoma, hypertension and anxiety/depression who presented to the ER 02/22/2023  with shortness of breath and left side chest pain.    Chest radiograph showed concern for left upper lob/suprahilar lung mass with secondary collapse of lung. CT Chest with contrast done which shows LUL mass 7.1 x 2.8cm with extension into the hilum. Occlusion of the left upper lobe bronchus with near complete atelectasis of LUL. Small left pleural effusion. Concern for non-enhancement of the left internal jugular vein with formation of collaterals concerning for occlusion or blood clots.    She smokes a couple of cigarettes per day and is around second hand smoke from her husband at times for 30+ years. She is retired, no history of chemical or dust exposures. No hemoptysis. No weight loss, fevers, chills or sweats.   She was seen by the inpatient team, and she was scheduled for a pulmonary consult to evaluate her for bronchoscopy with biopsies to better evaluate the lung mass. She is high risk with her smoking history, and second hand smoke exposure.  She was seen in the office on March 04, 2023.  At that time we discussed best steps moving forward to better evaluate the left upper lobe mass that measures 7.1 x 2.8 cm with extension into the hilum was to do a biopsy.  She was counseled on smoking cessation at this visit. She underwent bronchoscopy with biopsies on 03/11/2023.  She is here to review the cytology results.I  03/22/2023 Pt. Presents for follow up after bronchoscopy. She states she has been doing well since the procedure.  No  fever, hemoptysis, discolored secretions, new onset dyspnea, or adverse reaction to anesthesia.  We discussed the results of her cytology.  They were negative for malignancy. I have reviewed Dr. Kavin Leech procedure note, and he stated that there had been some re-inflation of the chest  between the planning CT chest and the biopsy. We discussed the option of PET scan to further evaluate this finding, or a short term 3 month follow up scan. She is comfortable with a short term follow up. Plan will be for a 3 month follow up Ct chest.   I have asked the patient to return to the clinic for any hemoptysis, cough that will not resolve  or unexplained weight loss. She verbalized understanding.   Test Results: Cytology 03/11/2023 A.  LEFT LUNG, UPPER LOBE, MASS, FINE NEEDLE ASPIRATION:  - Negative for malignancy  - Predominantly blood with fragments of benign alveolated lung  (cellblock)   B. LEFT LUNG, UPPER LOBE, MASS, BRUSHING:  - Negative for malignancy  - Predominantly blood   The fragments of alveolated lung within the cellblock (A) show patchy  fibrosis and a chronic inflammatory cell infiltrate including frequent  eosinophils.  The alveoli contain scattered pigmented macrophages.  An  immunohistochemical stain for CD1a performed with adequate control is  negative within these macrophages.   Negative fungal, AFB, anaerobes    Latest Ref Rng & Units 02/22/2023    6:56 AM 02/21/2023   12:12 PM 12/31/2022    2:08 PM  CBC  WBC 4.0 - 10.5 K/uL  10.0  9.4  5.9   Hemoglobin 12.0 - 15.0 g/dL 16.1  09.6  04.5   Hematocrit 36.0 - 46.0 % 41.0  42.3  43.7   Platelets 150 - 400 K/uL 366  366  321        Latest Ref Rng & Units 02/22/2023    6:56 AM 02/21/2023   12:12 PM 12/31/2022    2:08 PM  BMP  Glucose 70 - 99 mg/dL 409  811  86   BUN 8 - 23 mg/dL 9  8  6    Creatinine 0.44 - 1.00 mg/dL 9.14  7.82  9.56   BUN/Creat Ratio 12 - 28   9   Sodium 135 - 145 mmol/L 138  137  143   Potassium 3.5 - 5.1  mmol/L 3.6  3.3  4.0   Chloride 98 - 111 mmol/L 100  99  103   CO2 22 - 32 mmol/L 27  24  26    Calcium 8.9 - 10.3 mg/dL 9.3  9.6  8.9     BNP No results found for: "BNP"  ProBNP No results found for: "PROBNP"  PFT No results found for: "FEV1PRE", "FEV1POST", "FVCPRE", "FVCPOST", "TLC", "DLCOUNC", "PREFEV1FVCRT", "PSTFEV1FVCRT"  US Pelvic Complete With Transvaginal Result Date: 03/21/2023 : PROCEDURE: US PELVIS COMPLETE WITH TRANSVAGINAL HISTORY: Patient is a 73 y/o F with follow up right ovarian cyst. Hysterectomy. COMPARISON: U/S pelvis 07/23/2022. TECHNIQUE: Two-dimensional transabdominal grayscale and color Doppler ultrasound of the pelvis was performed. Transvaginal was also performed. FINDINGS: The uterus is surgically absent. The right ovary measures 2.9 x 2.6 x 2.4 cm and demonstrates a simple cyst measuring 2.3 cm (previously 3.0 cm). There is normal color Doppler flow. The left ovary is not visualized. There is no fluid present within the cul-de-sac. IMPRESSION: 1. Surgically absent uterus and nonvisualization of the left ovary. 2. Simple right ovarian cyst has decreased in size since prior imaging dating back to June 2024. Thank you for allowing [place] to assist in the care of this patient. Electronically Signed   By: Lestine Box M.D.   On: 03/21/2023 21:23   DG Chest Port 1 View Result Date: 03/11/2023 CLINICAL DATA:  Status post bronchoscopy and biopsy EXAM: PORTABLE CHEST 1 VIEW COMPARISON:  02/21/2023 FINDINGS: Single frontal view of the chest demonstrates a stable cardiac silhouette. The left upper lobe consolidation seen on recent CT and x-ray has near completely resolved. No new airspace disease, effusion, or pneumothorax. No acute bony abnormalities. IMPRESSION: 1. Marked improved aeration of the left upper lobe since prior studies. 2. No complication after bronchoscopy and biopsy. Electronically Signed   By: Sharlet Salina M.D.   On: 03/11/2023 15:42   DG C-ARM  BRONCHOSCOPY Result Date: 03/11/2023 C-ARM BRONCHOSCOPY: Fluoroscopy was utilized by the requesting physician.  No radiographic interpretation.   DG C-Arm 1-60 Min-No Report Result Date: 03/11/2023 Fluoroscopy was utilized by the requesting physician.  No radiographic interpretation.   [place] LIMITED JOINT SPACE STRUCTURES LOW RIGHT(NO LINKED CHARGES) Result Date: 02/25/2023 Procedure: Real-time Ultrasound Guided Injection of right knee joint superior lateral patella space Device: Philips Affiniti 50G/GE Logiq Images permanently stored and available for review in PACS Verbal informed consent obtained.  Discussed risks and benefits of procedure. Warned about infection, bleeding, hyperglycemia damage to structures among others. Patient expresses understanding and agreement Time-out conducted.  Noted no overlying erythema, induration, or other signs of local infection.  Skin prepped in a sterile fashion.  Local anesthesia: Topical Ethyl chloride.  With sterile  technique and under real time ultrasound guidance: 40 mg of Kenalog and 2 mL of Marcaine injected into knee joint. Fluid seen entering the joint capsule.  Completed without difficulty  Pain immediately resolved suggesting accurate placement of the medication.  Advised to call if fevers/chills, erythema, induration, drainage, or persistent bleeding.  Images permanently stored and available for review in the ultrasound unit. Impression: Technically successful ultrasound guided injection.    VAS [place] UPPER EXTREMITY VENOUS DUPLEX Result Date: 02/23/2023 UPPER VENOUS STUDY  Patient Name:  AYDA TANCREDI  Date of Exam:   02/22/2023 Medical Rec #: [id number]         Accession #:    [id number] Date of Birth: 24-Aug-1950         Patient Gender: F Patient Age:   65 years Exam Location:  [facility] Procedure:      VAS [place] UPPER EXTREMITY VENOUS DUPLEX Referring Phys: TIMOTHY OPYD --------------------------------------------------------------------------------   Indications: Enhancement of left internal jugular vein noted in CT of chest. New lung mass, measuring 7.1 cm X 2.8 cm noted in the left upper lobe extending into the hilum. Risk Factors: Cancer Papillary thyroid carcinoma status post resection 1997. Limitations: Line and musculoskeletal features. Comparison Study: No prior upper extremity venous duplex on file Performing Technologist: Sherren Kerns RVS  Examination Guidelines: A complete evaluation includes B-mode imaging, spectral Doppler, color Doppler, and power Doppler as needed of all accessible portions of each vessel. Bilateral testing is considered an integral part of a complete examination. Limited examinations for reoccurring indications may be performed as noted.  Right Findings: +----------+------------+---------+-----------+----------+-------+ RIGHT     CompressiblePhasicitySpontaneousPropertiesSummary +----------+------------+---------+-----------+----------+-------+ IJV           Full       Yes       Yes                      +----------+------------+---------+-----------+----------+-------+ Subclavian               Yes       Yes                      +----------+------------+---------+-----------+----------+-------+  Left Findings: +----------+------------+---------+-----------+----------+-------+ LEFT      CompressiblePhasicitySpontaneousPropertiesSummary +----------+------------+---------+-----------+----------+-------+ IJV           Full       Yes       Yes                      +----------+------------+---------+-----------+----------+-------+ Subclavian               Yes       Yes                      +----------+------------+---------+-----------+----------+-------+ Axillary                 Yes       Yes                      +----------+------------+---------+-----------+----------+-------+ Brachial      Full       Yes       Yes                       +----------+------------+---------+-----------+----------+-------+ Radial        Full                                          +----------+------------+---------+-----------+----------+-------+  Ulnar         Full                                          +----------+------------+---------+-----------+----------+-------+ Cephalic      Full                                          +----------+------------+---------+-----------+----------+-------+ Basilic       Full                                          +----------+------------+---------+-----------+----------+-------+ Left internal jugular vein appears patent. The mid to distal portion appears to be tortuous, crossing behind the CCA.  Summary:  Right: No evidence of thrombosis in the subclavian.  Left: No evidence of deep vein thrombosis in the upper extremity. No evidence of superficial vein thrombosis in the upper extremity.  *See table(s) above for measurements and observations.  Diagnosing physician: Carolynn Sayers Electronically signed by Carolynn Sayers on 02/23/2023 at 9:51:13 AM.    Final    CT Chest W Contrast Result Date: 02/21/2023 CLINICAL DATA:  Shortness of breath and chest pain with left lung atelectasis on same day chest radiograph EXAM: CT CHEST WITH CONTRAST TECHNIQUE: Multidetector CT imaging of the chest was performed during intravenous contrast administration. RADIATION DOSE REDUCTION: This exam was performed according to the departmental dose-optimization program which includes automated exposure control, adjustment of the mA and/or kV according to patient size and/or use of iterative reconstruction technique. CONTRAST:  75mL OMNIPAQUE IOHEXOL 350 MG/ML SOLN COMPARISON:  Same day chest radiograph FINDINGS: Cardiovascular: Normal heart size. No significant pericardial fluid/thickening. Great vessels are normal in course and caliber. No central pulmonary emboli. Coronary artery calcifications and aortic atherosclerosis.  Extensive enhancement of subcutaneous collateral vessels. Nonenhancement of the left internal jugular vein. Mediastinum/Nodes: Leftward shift of the mediastinum. Imaged thyroid gland without nodules meeting criteria for imaging follow-up by size. Small hiatal hernia. No pathologically enlarged axillary, supraclavicular, mediastinal, or hilar lymph nodes. Lungs/Pleura: There is marked effacement of the left upper lobe bronchus and abrupt cut off of the left lower lobe bronchus. The central airways are otherwise patent. Near-complete atelectasis of the left upper lobe with subsegmental atelectasis of the lingula. Within this area of left upper lobe atelectasis, there is masslike hypoenhancement measuring 7.1 x 2.8 cm (3:44) which demonstrates extension to the hilum (3:59). Minimal linear atelectasis at the left lower lobe base. Scattered linear atelectasis in the right middle and lower lobes. Right apical pleural-parenchymal scarring. No pneumothorax. Small left pleural effusion. Upper abdomen: Normal. Musculoskeletal: No acute or abnormal lytic or blastic osseous lesions. Multilevel degenerative changes of the thoracic spine. Rounded density in the upper inner right breast measures 1.4 x 1.1 cm (3:81). IMPRESSION: 1. Marked effacement of the left upper lobe bronchus and abrupt cut off of the left lower lobe bronchus with near-complete atelectasis of the left upper lobe and subsegmental atelectasis of the lingula. Within this area of left upper lobe atelectasis, there is masslike hypoenhancement measuring 7.1 x 2.8 cm which demonstrates extension to the hilum. Findings are suspicious for primary lung malignancy, less likely superimposed pneumonia. 2. Small left pleural effusion. 3. Nonenhancement of  the left internal jugular vein with extensive enhancement of subcutaneous collateral vessels. Recommend correlation with Doppler ultrasound examination of the left internal jugular vein. 4. Rounded density in the upper  inner right breast measures 1.4 x 1.1 cm. Recommend correlation with dedicated breast imaging, if this finding has not been previously evaluated. 5. Aortic Atherosclerosis (ICD10-I70.0). Coronary artery calcifications. Assessment for potential risk factor modification, dietary therapy or pharmacologic therapy may be warranted, if clinically indicated. Electronically Signed   By: Agustin Cree M.D.   On: 02/21/2023 16:51   DG Chest 2 View Addendum Date: 02/21/2023 ADDENDUM REPORT: 02/21/2023 14:28 ADDENDUM: Case discussed with Christiane Ha Davis's clinical service at 2:25 p.m. Electronically Signed   By: Jeronimo Greaves M.D.   On: 02/21/2023 14:28   Result Date: 02/21/2023 CLINICAL DATA:  Shortness of breath and chest pain EXAM: CHEST - 2 VIEW COMPARISON:  10/06/2022 FINDINGS: Surgical clips in the low neck. Midline trachea. Normal heart size. No pleural effusion or pneumothorax. Clear right lung. Soft tissue fullness in the left suprahilar region with increased anterior density on the lateral view. Volume loss throughout the left hemithorax with new left hemidiaphragm elevation. IMPRESSION: Abnormal appearance of the left hemithorax, suspicious for central left upper lobe/suprahilar lung mass with secondary partial collapse and hemidiaphragm elevation. Recommend further evaluation with contrast enhanced chest CT. A call to the emergency room physician is pending. Electronically Signed: By: Jeronimo Greaves M.D. On: 02/21/2023 14:17     Past medical hx Past Medical History:  Diagnosis Date   Allergic rhinitis 05/19/2007   Qualifier: Diagnosis of   By: Lovell Sheehan MD, Balinda Quails        Allergy    Anxiety    Arthritis    Cancer [facility]) 1997   Thyroid-Papillary   Complication of anesthesia    vomitting only   Constipation 07/13/2014   Cyst of breast, right, diffuse fibrocystic    Depression    Hypertension    Hypothyroidism    Memory change 06/25/2019   PONV (postoperative nausea and vomiting)    Pure  hypercholesterolemia 12/08/2013   Smokers' cough (HCC) 12/11/2017     Social History   Tobacco Use   Smoking status: Some Days    Current packs/day: 0.50    Average packs/day: 0.5 packs/day for 15.0 years (7.5 ttl pk-yrs)    Types: Cigarettes    Passive exposure: Past   Smokeless tobacco: Never   Tobacco comments:    Smokes 2-3 cigs some days 03-19-2023  Vaping Use   Vaping status: Never Used  Substance Use Topics   Alcohol use: Yes    Comment: occ wine   Drug use: No    Ms.Weick reports that she has been smoking cigarettes. She has a 7.5 pack-year smoking history. She has been exposed to tobacco smoke. She has never used smokeless tobacco. She reports current alcohol use. She reports that she does not use drugs.  Tobacco Cessation: Ready to quit: Not Answered Counseling given: Not Answered Tobacco comments: Smokes 2-3 cigs some days 03-19-2023  Smoking cessation Counseling  The patient's current tobacco use:2-3 cigarettes per day The patient was advised to quit and impact of smoking on their health.  I assessed the patient's willingness to attempt to quit. She is willing to work on this. I provided methods and skills for cessation. Resources to help quit smoking were provided, see AVS. A smoking cessation quit date was set: No Follow-up was arranged in our clinic.  Counseled x  3 minutes  minutes  today in clinic   Past surgical hx, Family hx, Social hx all reviewed.  Current Outpatient Medications on File Prior to Visit  Medication Sig   albuterol (VENTOLIN HFA) 108 (90 Base) MCG/ACT inhaler Inhale 2 puffs into the lungs every 6 (six) hours as needed for wheezing or shortness of breath.   Ascorbic Acid (VITAMIN C PO) Take 1 tablet by mouth daily.   buPROPion (WELLBUTRIN XL) 150 MG 24 hr tablet Take 1 tablet (150 mg total) by mouth daily.   CALCIUM PO Take 1 tablet by mouth daily.   COENZYME Q-10 PO Take 1 tablet by mouth daily.   Cyanocobalamin (VITAMIN B-12 PO)  Take 1 tablet by mouth daily.   DULoxetine (CYMBALTA) 60 MG capsule Take 1 capsule (60 mg total) by mouth 2 (two) times daily.   ferrous sulfate 325 (65 FE) MG tablet Take 325 mg by mouth every evening.    Ginkgo Biloba (GNP GINGKO BILOBA EXTRACT PO) Take 1 tablet by mouth daily.   levothyroxine (SYNTHROID) 112 MCG tablet Take 1 tablet (112 mcg total) by mouth daily.   Misc Natural Products (GLUCOSAMINE CHOND COMPLEX/MSM) TABS Take 1 tablet by mouth 2 (two) times daily.   Multiple Vitamins-Minerals (MULTIVITAMIN WITH MINERALS) tablet Take 1 tablet by mouth daily.   Nutritional Supplements (DHEA PO) Take 1 tablet by mouth daily.   omeprazole (PRILOSEC) 20 MG capsule Take 1 capsule (20 mg total) by mouth daily.   Turmeric (QC TUMERIC COMPLEX PO) Take 1 tablet by mouth daily.   No current facility-administered medications on file prior to visit.     Allergies  Allergen Reactions   Permethrin Rash    Review Of Systems:  Constitutional:   No  weight loss, night sweats,  Fevers, chills, fatigue, or  lassitude.  HEENT:   No headaches,  Difficulty swallowing,  Tooth/dental problems, or  Sore throat,                No sneezing, itching, ear ache, nasal congestion, post nasal drip,   CV:  No chest pain,  Orthopnea, PND, swelling in lower extremities, anasarca, dizziness, palpitations, syncope.   GI  No heartburn, indigestion, abdominal pain, nausea, vomiting, diarrhea, change in bowel habits, loss of appetite, bloody stools.   Resp: No shortness of breath with exertion or at rest.  No excess mucus, no productive cough,  No non-productive cough,  No coughing up of blood.  No change in color of mucus.  No wheezing.  No chest wall deformity  Skin: no rash or lesions.  GU: no dysuria, change in color of urine, no urgency or frequency.  No flank pain, no hematuria   MS:  No joint pain or swelling.  No decreased range of motion.  No back pain.  Psych:  No change in mood or affect. No depression  or anxiety.  No memory loss.   Vital Signs BP 130/84 (BP Location: Right Arm, Patient Position: Sitting, Cuff Size: Normal)   Pulse 76   Ht 5' 9.75" (1.772 m)   Wt 187 lb 12.8 oz (85.2 kg)   SpO2 95%   BMI 27.14 kg/m    Physical Exam:  General- No distress,  A&Ox3, pleasant ENT: No sinus tenderness, TM clear, pale nasal mucosa, no oral exudate,no post nasal drip, no LAN Cardiac: S1, S2, regular rate and rhythm, no murmur Chest: No wheeze/ rales/ dullness; no accessory muscle use, no nasal flaring, no sternal retractions, slightly diminished per bases Abd.: Soft Non-tender, ND, BS +,  Body mass index is 27.14 kg/m.  Ext: No clubbing cyanosis, edema Neuro:  normal strength, MAE x 4, A&O x 4, appropriate Skin: No rashes, warm and dry, no obvious lesions  Psych: normal mood and behavior   Assessment/Plan LUL mass 7.1 x 2.8cm with extension into the hilum. Occlusion of the left upper lobe bronchus with near complete atelectasis of LUL Plan Your biopsy was negative for malignancy. This is good news. From Dr. Kavin Leech procedure note, it looks like there had been some re-inflation of the lung since the planning CT chest.  I have reached out to him to determine if we need to do a PET scan or if we will do short term surveillance imaging. I have scheduled a 3 month follow up scan which will be due April 2025.  Call for any unexplained weight loss, or blood in your secretions, or shortness of breath.  Follow up in 3 months after Ct chest to review results. Please contact office for sooner follow up if symptoms do not improve or worsen or seek emergency care    Current Every Day smoker Plan You can receive free nicotine replacement therapy (patches, gum, or mints) by calling 1-800-QUIT NOW. Please call so we can get you on the path to becoming a non-smoker. I know it is hard, but you can do this!  Hypnosis for smoking cessation  [employer] [phone]  Acupuncture for smoking  cessation  [employer] [phone]    I spent 35 minutes dedicated to the care of this patient on the date of this encounter to include pre-visit review of records, face-to-face time with the patient discussing conditions above, post visit ordering of testing, clinical documentation with the electronic health record, making appropriate referrals as documented, and communicating necessary information to the patient's healthcare team.   Bevelyn Ngo, NP 03/22/2023  7:48 PM

## 2023-03-19 NOTE — Patient Instructions (Addendum)
It is good to see you today. Your biopsy was negative for malignancy. This is good news. From Dr. Kavin Leech procedure note, it looks like there had been some re-inflation of the lung since the planning CT chest.  I have reached out to him to determine if we need to do a PET scan or if we will do short term surveillance imaging. I have scheduled a 3 month follow up scan which will be due April 2025.  Call for any unexplained weight loss, or blood in your secretions, or shortness of breath.  Follow up in 3 months after Ct chest to review results. Please contact office for sooner follow up if symptoms do not improve or worsen or seek emergency care

## 2023-03-21 ENCOUNTER — Ambulatory Visit
Admission: RE | Admit: 2023-03-21 | Discharge: 2023-03-21 | Disposition: A | Discharge status: Home / Self Care | Payer: Medicare PPO | Source: Ambulatory Visit | Attending: Internal Medicine | Admitting: Internal Medicine

## 2023-03-21 DIAGNOSIS — Z90710 Acquired absence of both cervix and uterus: Secondary | ICD-10-CM | POA: Diagnosis not present

## 2023-03-21 DIAGNOSIS — N83291 Other ovarian cyst, right side: Secondary | ICD-10-CM | POA: Diagnosis not present

## 2023-03-21 DIAGNOSIS — N83209 Unspecified ovarian cyst, unspecified side: Secondary | ICD-10-CM

## 2023-03-23 ENCOUNTER — Encounter: Payer: Self-pay | Admitting: Acute Care

## 2023-03-24 ENCOUNTER — Encounter: Payer: Self-pay | Admitting: Internal Medicine

## 2023-03-26 ENCOUNTER — Other Ambulatory Visit: Payer: Self-pay | Admitting: Physician Assistant

## 2023-03-31 DIAGNOSIS — Z1211 Encounter for screening for malignant neoplasm of colon: Secondary | ICD-10-CM | POA: Diagnosis not present

## 2023-04-01 ENCOUNTER — Other Ambulatory Visit: Payer: Self-pay | Admitting: *Deleted

## 2023-04-01 MED ORDER — OMEPRAZOLE 20 MG PO CPDR: 20.0000 mg | DELAYED_RELEASE_CAPSULE | Freq: Every day | ORAL | 2 refills | Status: DC

## 2023-04-06 LAB — COLOGUARD: COLOGUARD: POSITIVE — AB

## 2023-04-09 LAB — FUNGUS CULTURE RESULT

## 2023-04-09 LAB — FUNGUS CULTURE WITH STAIN

## 2023-04-09 LAB — FUNGAL ORGANISM REFLEX

## 2023-04-14 ENCOUNTER — Ambulatory Visit
Admission: RE | Admit: 2023-04-14 | Discharge: 2023-04-14 | Disposition: A | Discharge status: Home / Self Care | Payer: Medicare PPO | Source: Ambulatory Visit | Attending: Internal Medicine | Admitting: Internal Medicine

## 2023-04-14 DIAGNOSIS — Z1231 Encounter for screening mammogram for malignant neoplasm of breast: Secondary | ICD-10-CM | POA: Diagnosis not present

## 2023-04-17 ENCOUNTER — Other Ambulatory Visit: Payer: Self-pay | Admitting: Internal Medicine

## 2023-04-17 ENCOUNTER — Telehealth: Payer: Self-pay | Admitting: Acute Care

## 2023-04-17 DIAGNOSIS — R928 Other abnormal and inconclusive findings on diagnostic imaging of breast: Secondary | ICD-10-CM

## 2023-04-17 NOTE — Telephone Encounter (Signed)
 I have attempted to call the patient to discuss follow up scanning. There was no answer. I will try again today.

## 2023-04-22 LAB — ACID FAST CULTURE WITH REFLEXED SENSITIVITIES (MYCOBACTERIA): Acid Fast Culture: NEGATIVE

## 2023-04-24 ENCOUNTER — Ambulatory Visit
Admission: RE | Admit: 2023-04-24 | Discharge: 2023-04-24 | Disposition: A | Discharge status: Home / Self Care | Payer: Medicare PPO | Source: Ambulatory Visit | Attending: Internal Medicine | Admitting: Internal Medicine

## 2023-04-24 DIAGNOSIS — N6011 Diffuse cystic mastopathy of right breast: Secondary | ICD-10-CM | POA: Diagnosis not present

## 2023-04-24 DIAGNOSIS — R928 Other abnormal and inconclusive findings on diagnostic imaging of breast: Secondary | ICD-10-CM

## 2023-04-24 DIAGNOSIS — N6310 Unspecified lump in the right breast, unspecified quadrant: Secondary | ICD-10-CM | POA: Diagnosis not present

## 2023-05-11 ENCOUNTER — Encounter: Payer: Self-pay | Admitting: Internal Medicine

## 2023-05-12 NOTE — Telephone Encounter (Signed)
 Per chart looks like pt is referring to: butalbital-acetaminophen-caffeine (ESGIC) 50-325-40 MG per tablet  This has not been filled since 2021 by [facility], pt is requesting to have it filled

## 2023-06-17 ENCOUNTER — Other Ambulatory Visit: Payer: Medicare PPO

## 2023-07-01 ENCOUNTER — Other Ambulatory Visit: Payer: Self-pay | Admitting: Internal Medicine

## 2023-07-04 ENCOUNTER — Other Ambulatory Visit: Payer: Self-pay | Admitting: Internal Medicine

## 2023-08-18 ENCOUNTER — Other Ambulatory Visit: Payer: Self-pay | Admitting: Internal Medicine

## 2023-08-26 ENCOUNTER — Other Ambulatory Visit: Payer: Self-pay | Admitting: Internal Medicine

## 2023-08-26 DIAGNOSIS — I10 Essential (primary) hypertension: Secondary | ICD-10-CM

## 2023-08-27 ENCOUNTER — Ambulatory Visit

## 2023-08-27 VITALS — Ht 69.75 in | Wt 187.0 lb

## 2023-08-27 DIAGNOSIS — Z0000 Encounter for general adult medical examination without abnormal findings: Secondary | ICD-10-CM

## 2023-08-27 NOTE — Patient Instructions (Signed)
 Ms. Rezek , Thank you for taking time out of your busy schedule to complete your Annual Wellness Visit with me. I enjoyed our conversation and look forward to speaking with you again next year. I, as well as your care team,  appreciate your ongoing commitment to your health goals. Please review the following plan we discussed and let me know if I can assist you in the future. Your Game plan/ To Do List    Referrals: If you haven't heard from the office you've been referred to, please reach out to them at the phone provided.  You have an order for:  [x]   Bone Density     Please call for appointment:  [facility] [address] [place], [place] [address] [phone]    Make sure to wear two-piece clothing.  No lotions, powders, or deodorants the day of the appointment. Make sure to bring picture ID and insurance card.  Bring list of medications you are currently taking including any supplements.   Follow up Visits: Next Medicare AWV with our clinical staff: 08/30/2024.   Have you seen your provider in the last 6 months (3 months if uncontrolled diabetes)? No Next Office Visit with your provider: 09/29/2023.  Clinician Recommendations:  Aim for 30 minutes of exercise or brisk walking, 6-8 glasses of water, and 5 servings of fruits and vegetables each day. Keep up the good work.      This is a list of the screening recommended for you and due dates:  Health Maintenance  Topic Date Due   Colon Cancer Screening  Never done   COVID-19 Vaccine (3 - 2024-25 season) 10/20/2022   Medicare Annual Wellness Visit  07/22/2023   Flu Shot  09/19/2023   Mammogram  04/13/2025   DTaP/Tdap/Td vaccine (3 - Td or Tdap) 12/27/2025   Pneumococcal Vaccine for age over 35  Completed   DEXA scan (bone density measurement)  Completed   Hepatitis C Screening  Completed   Zoster (Shingles) Vaccine  Completed   Hepatitis B Vaccine  Aged Out   HPV Vaccine  Aged Out   Meningitis B  Vaccine  Aged Out    Advanced directives: (Declined) Advance directive discussed with you today. Even though you declined this today, please call our office should you change your mind, and we can give you the proper paperwork for you to fill out. Advance Care Planning is important because it:  [x]  Makes sure you receive the medical care that is consistent with your values, goals, and preferences  [x]  It provides guidance to your family and loved ones and reduces their decisional burden about whether or not they are making the right decisions based on your wishes.  Follow the link provided in your after visit summary or read over the paperwork we have mailed to you to help you started getting your Advance Directives in place. If you need assistance in completing these, please reach out to us  so that we can help you!  See attachments for Preventive Care and Fall Prevention Tips.

## 2023-08-27 NOTE — Progress Notes (Signed)
 Subjective:   Theresa Perez is a 73 y.o. who presents for a Medicare Wellness preventive visit.  As a reminder, Annual Wellness Visits don't include a physical exam, and some assessments may be limited, especially if this visit is performed virtually. We may recommend an in-person follow-up visit with your provider if needed.  Visit Complete: Virtual I connected with  [name] on 08/27/23 by a audio enabled telemedicine application and verified that I am speaking with the correct person using two identifiers.  Patient Location: Home  Provider Location: Home Office  I discussed the limitations of evaluation and management by telemedicine. The patient expressed understanding and agreed to proceed.  Vital Signs: Because this visit was a virtual/telehealth visit, some criteria may be missing or patient reported. Any vitals not documented were not able to be obtained and vitals that have been documented are patient reported.  VideoDeclined- This patient declined Librarian, academic. Therefore the visit was completed with audio only.  Persons Participating in Visit: Patient.  AWV Questionnaire: No: Patient Medicare AWV questionnaire was not completed prior to this visit.  Cardiac Risk Factors include: advanced age (>6men, >96 women)     Objective:    Today's Vitals   08/27/23 1509  Weight: 187 lb (84.8 kg)  Height: 5' 9.75 (1.772 m)   Body mass index is 27.02 kg/m.     08/27/2023    3:14 PM 03/11/2023   11:38 AM 02/21/2023    9:00 PM 02/21/2023    6:29 PM 02/21/2023   12:07 PM 12/31/2022    1:11 PM 07/22/2022    3:09 PM  Advanced Directives  Does Patient Have a Medical Advance Directive? No No No No No No No  Would patient like information on creating a medical advance directive? No - Patient declined No - Patient declined No - Patient declined    No - Patient declined    Current Medications (verified) Outpatient Encounter Medications as of  08/27/2023  Medication Sig   albuterol  (VENTOLIN  HFA) 108 (90 Base) MCG/ACT inhaler Inhale 2 puffs into the lungs every 6 (six) hours as needed for wheezing or shortness of breath.   Ascorbic Acid (VITAMIN C PO) Take 1 tablet by mouth daily.   buPROPion  (WELLBUTRIN  XL) 150 MG 24 hr tablet Take 1 tablet (150 mg total) by mouth daily.   CALCIUM PO Take 1 tablet by mouth daily.   COENZYME Q-10 PO Take 1 tablet by mouth daily.   Cyanocobalamin (VITAMIN B-12 PO) Take 1 tablet by mouth daily.   DULoxetine  (CYMBALTA ) 60 MG capsule Take 1 capsule (60 mg total) by mouth 2 (two) times daily.   ferrous sulfate 325 (65 FE) MG tablet Take 325 mg by mouth every evening.    Ginkgo Biloba (GNP GINGKO BILOBA EXTRACT PO) Take 1 tablet by mouth daily.   levothyroxine  (SYNTHROID ) 112 MCG tablet Take 1 tablet by mouth once daily   LORazepam  (ATIVAN ) 0.5 MG tablet TAKE 1 TABLET BY MOUTH ONCE DAILY AS NEEDED FOR ANXIETY   Misc Natural Products (GLUCOSAMINE CHOND COMPLEX/MSM) TABS Take 1 tablet by mouth 2 (two) times daily.   Multiple Vitamins-Minerals (MULTIVITAMIN WITH MINERALS) tablet Take 1 tablet by mouth daily.   Nutritional Supplements (DHEA PO) Take 1 tablet by mouth daily.   omeprazole  (PRILOSEC) 20 MG capsule Take 1 capsule (20 mg total) by mouth daily.   Turmeric (QC TUMERIC COMPLEX PO) Take 1 tablet by mouth daily.   No facility-administered encounter medications on  file as of 08/27/2023.    Allergies (verified) Permethrin    History: Past Medical History:  Diagnosis Date   Allergic rhinitis 05/19/2007   Qualifier: Diagnosis of   By: Mavis MD, Norleen BRAVO        Allergy    Anxiety    Arthritis    Cancer [facility]) 1997   Thyroid -Papillary   Complication of anesthesia    vomitting only   Constipation 07/13/2014   Cyst of breast, right, diffuse fibrocystic    Depression    Hypertension    Hypothyroidism    Memory change 06/25/2019   PONV (postoperative nausea and vomiting)    Pure  hypercholesterolemia 12/08/2013   Smokers' cough (HCC) 12/11/2017   Past Surgical History:  Procedure Laterality Date   ANTERIOR AND POSTERIOR REPAIR N/A 03/31/2012   Procedure: ANTERIOR (CYSTOCELE) AND POSTERIOR REPAIR (RECTOCELE);  Surgeon: Peggye Gull, MD;  Location: [facility];  Service: Gynecology;  Laterality: N/A;   aspiration cyst right breast     BREAST BIOPSY Left 01/09/2022   MM LT BREAST BX W LOC DEV 1ST LESION IMAGE BX SPEC STEREO GUIDE 01/09/2022 [facility]   BRONCHIAL BIOPSY  03/11/2023   Procedure: BRONCHIAL BIOPSIES;  Surgeon: Shelah Lamar RAMAN, MD;  Location: [facility] ENDOSCOPY;  Service: Pulmonary;;   BRONCHIAL BRUSHINGS  03/11/2023   Procedure: BRONCHIAL BRUSHINGS;  Surgeon: Shelah Lamar RAMAN, MD;  Location: [facility];  Service: Pulmonary;;   BRONCHIAL NEEDLE ASPIRATION BIOPSY  03/11/2023   Procedure: BRONCHIAL NEEDLE ASPIRATION BIOPSIES;  Surgeon: Shelah Lamar RAMAN, MD;  Location: [facility] ENDOSCOPY;  Service: Pulmonary;;   THYROIDECTOMY  02/19/1995   with parathyroidectomy   TONSILLECTOMY  02/19/1967   uterine ablation     VAGINAL HYSTERECTOMY N/A 03/31/2012   Procedure: HYSTERECTOMY VAGINAL;  Surgeon: Peggye Gull, MD;  Location: [facility];  Service: Gynecology;  Laterality: N/A;   Family History  Problem Relation Age of Onset   Diabetes Mother    Deep vein thrombosis Mother        Varicose Veins   Alzheimer's disease Mother    Cancer Father        Father died from brain tumor.   Heart disease Father    Other Brother        Heat stroke   Hypertension Son    Colon cancer Neg Hx    Stomach cancer Neg Hx    Esophageal cancer Neg Hx    Rectal cancer Neg Hx    Social History   Socioeconomic History   Marital status: Married    Spouse name: Deward   Number of children: 1   Years of education: 14   Highest education level: Associate degree: academic program  Occupational History   Occupation: retired  Tobacco Use   Smoking status: Some Days    Current packs/day: 0.50     Average packs/day: 0.5 packs/day for 15.0 years (7.5 ttl pk-yrs)    Types: Cigarettes    Passive exposure: Past   Smokeless tobacco: Never   Tobacco comments:    Smokes 2-3 cigs some days 03-19-2023  Vaping Use   Vaping status: Never Used  Substance and Sexual Activity   Alcohol use: Yes    Comment: occ wine   Drug use: No   Sexual activity: Yes    Birth control/protection: Post-menopausal  Other Topics Concern   Not on file  Social History Narrative   Left handed   Drinks caffeine    Two story home/lives with husband/2025 and 1 dog  Social Drivers of Corporate investment banker Strain: Low Risk  (08/27/2023)   Overall Financial Resource Strain (CARDIA)    Difficulty of Paying Living Expenses: Not hard at all  Food Insecurity: No Food Insecurity (08/27/2023)   Hunger Vital Sign    Worried About Running Out of Food in the Last Year: Never true    Ran Out of Food in the Last Year: Never true  Transportation Needs: No Transportation Needs (08/27/2023)   PRAPARE - Administrator, Civil Service (Medical): No    Lack of Transportation (Non-Medical): No  Physical Activity: Sufficiently Active (08/27/2023)   Exercise Vital Sign    Days of Exercise per Week: 5 days    Minutes of Exercise per Session: 60 min  Stress: Stress Concern Present (08/27/2023)   [employer] of Occupational Health - Occupational Stress Questionnaire    Feeling of Stress: To some extent  Social Connections: Socially Integrated (08/27/2023)   Social Connection and Isolation Panel    Frequency of Communication with Friends and Family: More than three times a week    Frequency of Social Gatherings with Friends and Family: More than three times a week    Attends Religious Services: More than 4 times per year    Active Member of [employer] or Organizations: Yes    Attends Banker Meetings: Never    Marital Status: Married    Tobacco Counseling Ready to quit: Not Answered Counseling given: Not  Answered Tobacco comments: Smokes 2-3 cigs some days 03-19-2023    Clinical Intake:     Pain : No/denies pain     BMI - recorded: 27.02 Nutritional Status: BMI 25 -29 Overweight Nutritional Risks: None  Lab Results  Component Value Date   HGBA1C 5.6 05/08/2020     How often do you need to have someone help you when you read instructions, pamphlets, or other written materials from your doctor or pharmacy?: 1 - Never  Interpreter Needed?: No  Information entered by :: Dorota Heinrichs, RMA   Activities of Daily Living     08/27/2023    3:13 PM 02/21/2023    9:00 PM  In your present state of health, do you have any difficulty performing the following activities:  Hearing? 1 0  Comment wears hearing aides sometimes   Vision? 0 0  Difficulty concentrating or making decisions? 0 0  Walking or climbing stairs? 0   Dressing or bathing? 0   Doing errands, shopping? 0 0  Preparing Food and eating ? N   Using the Toilet? N   In the past six months, have you accidently leaked urine? N   Do you have problems with loss of bowel control? N   Managing your Medications? N   Managing your Finances? N   Housekeeping or managing your Housekeeping? N     Patient Care Team: Rollene Almarie LABOR, MD as PCP - General (Internal Medicine) I have updated your Care Teams any recent Medical Services you may have received from other providers in the past year.     Assessment:   This is a routine wellness examination for [facility].  Hearing/Vision screen Hearing Screening - Comments:: Wears hearing aides sometimes  Vision Screening - Comments:: Wears eyeglasses/[facility]   Goals Addressed             This Visit's Progress    My goal for 2024 is to maintain my health and go to gym more often.   On  track      Depression Screen     08/27/2023    3:16 PM 07/29/2022    3:31 PM 07/22/2022    3:12 PM 01/24/2022   11:12 AM 06/13/2021    9:51 AM 06/12/2021    8:56 AM  02/14/2020    3:05 PM  PHQ 2/9 Scores  PHQ - 2 Score 0 0 0 0 0 0 0  PHQ- 9 Score 0 0 0 0 0      Fall Risk     08/27/2023    3:14 PM 03/04/2023   11:37 AM 07/29/2022    3:31 PM 07/22/2022    3:12 PM 01/24/2022   11:12 AM  Fall Risk   Falls in the past year? 0 0 0 0 0  Number falls in past yr: 0 0 0 0 0  Injury with Fall? 0 0 0 0 0  Risk for fall due to :    No Fall Risks   Follow up Falls evaluation completed;Falls prevention discussed  Falls evaluation completed Falls prevention discussed Falls evaluation completed      Data saved with a previous flowsheet row definition    MEDICARE RISK AT HOME:  Medicare Risk at Home Any stairs in or around the home?: Yes If so, are there any without handrails?: No Home free of loose throw rugs in walkways, pet beds, electrical cords, etc?: Yes Adequate lighting in your home to reduce risk of falls?: Yes Life alert?: No Use of a cane, walker or w/c?: No Grab bars in the bathroom?: Yes Shower chair or bench in shower?: Yes Elevated toilet seat or a handicapped toilet?: Yes  TIMED UP AND GO:  Was the test performed?  No  Cognitive Function: Declined/Normal: No cognitive concerns noted by patient or family. Patient alert, oriented, able to answer questions appropriately and recall recent events. No signs of memory loss or confusion.    08/16/2019   10:45 AM  MMSE - Mini Mental State Exam  Not completed: Refused      07/26/2019   10:00 AM  Montreal Cognitive Assessment   Visuospatial/ Executive (0/5) 5  Naming (0/3) 3  Attention: Read list of digits (0/2) 2  Attention: Read list of letters (0/1) 1  Attention: Serial 7 subtraction starting at 100 (0/3) 3  Language: Repeat phrase (0/2) 2  Language : Fluency (0/1) 1  Abstraction (0/2) 1  Delayed Recall (0/5) 5  Orientation (0/6) 6  Total 29      07/22/2022    3:09 PM 06/12/2021    9:06 AM  6CIT Screen  What Year? 0 points 0 points  What month? 0 points 0 points  What time? 0 points  0 points  Count back from 20 0 points 0 points  Months in reverse 0 points 0 points  Repeat phrase 0 points 0 points  Total Score 0 points 0 points    Immunizations Immunization History  Administered Date(s) Administered   Hepatitis A, Adult 01/27/2015   Influenza Split 04/01/2012   Influenza,inj,Quad PF,6+ Mos 12/08/2013, 12/20/2017, 12/23/2020   Janssen (J&J) SARS-COV-2 Vaccination 06/22/2019, 01/07/2020   Pneumococcal Conjugate-13 12/08/2013   Pneumococcal Polysaccharide-23 05/26/2017   Td 05/19/2005   Tdap 12/28/2015   Zoster Recombinant(Shingrix) 09/08/2017, 12/24/2017, 01/12/2018   Zoster, Live 07/13/2014    Screening Tests Health Maintenance  Topic Date Due   Colonoscopy  Never done   COVID-19 Vaccine (3 - 2024-25 season) 10/20/2022   Medicare Annual Wellness (AWV)  07/22/2023   INFLUENZA  VACCINE  09/19/2023   MAMMOGRAM  04/13/2025   DTaP/Tdap/Td (3 - Td or Tdap) 12/27/2025   Pneumococcal Vaccine: 50+ Years  Completed   DEXA SCAN  Completed   Hepatitis C Screening  Completed   Zoster Vaccines- Shingrix  Completed   Hepatitis B Vaccines  Aged Out   HPV VACCINES  Aged Out   Meningococcal B Vaccine  Aged Out    Health Maintenance  Health Maintenance Due  Topic Date Due   Colonoscopy  Never done   COVID-19 Vaccine (3 - 2024-25 season) 10/20/2022   Medicare Annual Wellness (AWV)  07/22/2023   Health Maintenance Items Addressed: DEXA ordered, See Nurse Notes at the end of this note  Additional Screening:  Vision Screening: Recommended annual ophthalmology exams for early detection of glaucoma and other disorders of the eye. Would you like a referral to an eye doctor? No    Dental Screening: Recommended annual dental exams for proper oral hygiene  Community Resource Referral / Chronic Care Management: CRR required this visit?  No   CCM required this visit?  No   Plan:    I have personally reviewed and noted the following in the patient's chart:    Medical and social history Use of alcohol, tobacco or illicit drugs  Current medications and supplements including opioid prescriptions. Patient is not currently taking opioid prescriptions. Functional ability and status Nutritional status Physical activity Advanced directives List of other physicians Hospitalizations, surgeries, and ER visits in previous 12 months Vitals Screenings to include cognitive, depression, and falls Referrals and appointments  In addition, I have reviewed and discussed with patient certain preventive protocols, quality metrics, and best practice recommendations. A written personalized care plan for preventive services as well as general preventive health recommendations were provided to patient.   Selby Foisy L Modesty Rudy, CMA   08/27/2023   After Visit Summary: (MyChart) Due to this being a telephonic visit, the after visit summary with patients personalized plan was offered to patient via MyChart   Notes: Patient is due for a DEXA and order has been placed today.  Patient is up to date on all other health maintenance with no concerns to address today.

## 2023-08-28 ENCOUNTER — Telehealth: Payer: Self-pay | Admitting: Internal Medicine

## 2023-08-28 NOTE — Telephone Encounter (Signed)
 Copied from CRM [id number]. Topic: Clinical - Medication Refill >> Aug 28, 2023 12:33 PM Viola F wrote: Medication: atenolol -chlorthalidone  (TENORETIC ) 50-25 MG tablet [[id number]]  DISCONTINUED  Has the patient contacted their pharmacy? Yes (Agent: If no, request that the patient contact the pharmacy for the refill. If patient does not wish to contact the pharmacy document the reason why and proceed with request.) (Agent: If yes, when and what did the pharmacy advise?)  This is the patient's preferred pharmacy:  [facility] [address] ([address] [place] - [address] [place] ([place]) [place] [address] Phone: [phone] Fax: [phone]  Is this the correct pharmacy for this prescription? Yes If no, delete pharmacy and type the correct one.   Has the prescription been filled recently? Yes  Is the patient out of the medication? No, 3 days left -going on vacation Monday   Has the patient been seen for an appointment in the last year OR does the patient have an upcoming appointment? Yes  Can we respond through MyChart? Yes  Agent: Please be advised that Rx refills may take up to 3 business days. We ask that you follow-up with your pharmacy.

## 2023-08-29 ENCOUNTER — Other Ambulatory Visit: Payer: Self-pay | Admitting: Internal Medicine

## 2023-08-29 DIAGNOSIS — I10 Essential (primary) hypertension: Secondary | ICD-10-CM

## 2023-08-29 NOTE — Telephone Encounter (Signed)
Please advsise

## 2023-08-29 NOTE — Telephone Encounter (Signed)
 Patient called to request atenolol -chlorthalidone  and was informed it is not on her medication list. It was discontinued in January of 2025, but patient said she is still taking it. It looks like a different provider started prescribing the medication the day after the discontinued date from our office. Patient is almost out of medication and will be going out of town on Monday, 09/01/23.  She is very upset when informed that Dr. Rollene is gone for the day and that she cannot get her medication. Patient was advised by her pharmacy that they can provide her an emergency 3 day supply.

## 2023-09-01 MED ORDER — ATENOLOL-CHLORTHALIDONE 50-25 MG PO TABS: 0.5000 | ORAL_TABLET | Freq: Every day | ORAL | 0 refills | Status: DC

## 2023-09-01 NOTE — Telephone Encounter (Signed)
 Sent in to pharmacy requested.

## 2023-09-17 ENCOUNTER — Other Ambulatory Visit: Payer: Self-pay | Admitting: Internal Medicine

## 2023-09-29 ENCOUNTER — Encounter: Payer: Self-pay | Admitting: Internal Medicine

## 2023-09-29 ENCOUNTER — Ambulatory Visit (INDEPENDENT_AMBULATORY_CARE_PROVIDER_SITE_OTHER): Admitting: Internal Medicine

## 2023-09-29 VITALS — BP 122/80 | HR 48 | Ht 69.75 in | Wt 172.0 lb

## 2023-09-29 DIAGNOSIS — R0602 Shortness of breath: Secondary | ICD-10-CM | POA: Diagnosis not present

## 2023-09-29 DIAGNOSIS — E890 Postprocedural hypothyroidism: Secondary | ICD-10-CM | POA: Diagnosis not present

## 2023-09-29 DIAGNOSIS — F419 Anxiety disorder, unspecified: Secondary | ICD-10-CM

## 2023-09-29 DIAGNOSIS — Z0000 Encounter for general adult medical examination without abnormal findings: Secondary | ICD-10-CM | POA: Diagnosis not present

## 2023-09-29 DIAGNOSIS — F4322 Adjustment disorder with anxiety: Secondary | ICD-10-CM

## 2023-09-29 DIAGNOSIS — R911 Solitary pulmonary nodule: Secondary | ICD-10-CM

## 2023-09-29 DIAGNOSIS — F428 Other obsessive-compulsive disorder: Secondary | ICD-10-CM | POA: Diagnosis not present

## 2023-09-29 DIAGNOSIS — F411 Generalized anxiety disorder: Secondary | ICD-10-CM | POA: Diagnosis not present

## 2023-09-29 DIAGNOSIS — I10 Essential (primary) hypertension: Secondary | ICD-10-CM

## 2023-09-29 DIAGNOSIS — F32A Depression, unspecified: Secondary | ICD-10-CM

## 2023-09-29 LAB — URINALYSIS, ROUTINE W REFLEX MICROSCOPIC
Bilirubin Urine: NEGATIVE
Ketones, ur: NEGATIVE
Nitrite: POSITIVE — AB
Specific Gravity, Urine: 1.015 (ref 1.000–1.030)
Total Protein, Urine: NEGATIVE
Urine Glucose: NEGATIVE
Urobilinogen, UA: 0.2 (ref 0.0–1.0)
pH: 7 (ref 5.0–8.0)

## 2023-09-29 LAB — COMPREHENSIVE METABOLIC PANEL WITH GFR
ALT: 20 U/L (ref 0–35)
AST: 22 U/L (ref 0–37)
Albumin: 4.4 g/dL (ref 3.5–5.2)
Alkaline Phosphatase: 52 U/L (ref 39–117)
BUN: 13 mg/dL (ref 6–23)
CO2: 33 meq/L — ABNORMAL HIGH (ref 19–32)
Calcium: 9.3 mg/dL (ref 8.4–10.5)
Chloride: 98 meq/L (ref 96–112)
Creatinine, Ser: 0.72 mg/dL (ref 0.40–1.20)
GFR: 83.8 mL/min (ref >60.00)
Glucose, Bld: 95 mg/dL (ref 70–99)
Potassium: 3.8 meq/L (ref 3.5–5.1)
Sodium: 139 meq/L (ref 135–145)
Total Bilirubin: 0.4 mg/dL (ref 0.2–1.2)
Total Protein: 7 g/dL (ref 6.0–8.3)

## 2023-09-29 LAB — TSH: TSH: 4.84 u[IU]/mL (ref 0.35–5.50)

## 2023-09-29 LAB — CBC
HCT: 41.6 % (ref 36.0–46.0)
Hemoglobin: 14 g/dL (ref 12.0–15.0)
MCHC: 33.7 g/dL (ref 30.0–36.0)
MCV: 89.3 fl (ref 78.0–100.0)
Platelets: 311 K/uL (ref 150.0–400.0)
RBC: 4.66 Mil/uL (ref 3.87–5.11)
RDW: 13.5 % (ref 11.5–15.5)
WBC: 7.6 K/uL (ref 4.0–10.5)

## 2023-09-29 LAB — LIPID PANEL
Cholesterol: 200 mg/dL (ref 0–200)
HDL: 45.4 mg/dL (ref >39.00)
LDL Cholesterol: 136 mg/dL — ABNORMAL HIGH (ref 0–99)
NonHDL: 154.46
Total CHOL/HDL Ratio: 4
Triglycerides: 91 mg/dL (ref 0.0–149.0)
VLDL: 18.2 mg/dL (ref 0.0–40.0)

## 2023-09-29 LAB — T4, FREE: Free T4: 0.71 ng/dL (ref 0.60–1.60)

## 2023-09-29 MED ORDER — ATENOLOL-CHLORTHALIDONE 50-25 MG PO TABS: 0.5000 | ORAL_TABLET | Freq: Every day | ORAL | 3 refills | Status: AC

## 2023-09-29 MED ORDER — DULOXETINE HCL 60 MG PO CPEP: 60.0000 mg | ORAL_CAPSULE | Freq: Two times a day (BID) | ORAL | 3 refills | Status: AC

## 2023-09-29 MED ORDER — LEVOTHYROXINE SODIUM 112 MCG PO TABS: 112.0000 ug | ORAL_TABLET | Freq: Every day | ORAL | 3 refills | Status: AC

## 2023-09-29 MED ORDER — BUPROPION HCL ER (XL) 300 MG PO TB24: 300.0000 mg | ORAL_TABLET | Freq: Every day | ORAL | 3 refills | Status: DC

## 2023-09-29 NOTE — Progress Notes (Signed)
 Subjective:   Patient ID: Theresa Perez, female    DOB: 1951/01/09, 73 y.o.   MRN: [id number]  The patient is here for physical. Pertinent topics discussed: Discussed the use of AI scribe software for clinical note transcription with the patient, who gave verbal consent to proceed.  History of Present Illness Theresa Perez is a 73 year old female who presents with increased anxiety and concerns about her husband's health.  She has been experiencing increased anxiety and stress due to her husband's recent health issues, including a potential pancreatic problem that may require a needle biopsy. This situation has caused significant nervousness and worry, affecting her daily life and emotional well-being.  Her husband experienced a syncopal episode in the garage in late April, necessitating emergency medical intervention and hospitalization for several days. Since then, his health has deteriorated, requiring the use of a wheelchair and continuous oxygen. She has assumed the role of primary caregiver, which has heightened her stress levels.  She experiences fluctuating moods, describing 'good day, bad day' patterns. She has been using lorazepam  more frequently, typically half a tablet every couple of days, to manage her anxiety. Additionally, she takes bupropion  at the lowest available dose. She mentioned that her granddaughter uses clonazepam occasionally, which she tried once during a stressful situation and found helpful.  She experienced a urinary tract infection while on a trip to the beach, her first such infection. She noticed blood when wiping and was concerned about needing urgent care. Her granddaughter provided antibiotics, which she took for two days, resulting in symptom improvement.  Her social history includes being previously active in community services and physical activities, such as mobile meals and gym visits. However, her husband's health issues have led her to stay home  more often to care for him, negatively impacting her mood. 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  PMH, [facility], social history reviewed and updated  Review of Systems  Constitutional: Negative.   HENT: Negative.    Eyes: Negative.   Respiratory:  Negative for cough, chest tightness and shortness of breath.   Cardiovascular:  Negative for chest pain, palpitations and leg swelling.  Gastrointestinal:  Negative for abdominal distention, abdominal pain, constipation, diarrhea, nausea and vomiting.  Musculoskeletal: Negative.   Skin: Negative.   Neurological: Negative.   Psychiatric/Behavioral:  Positive for decreased concentration and dysphoric mood. The patient is nervous/anxious.     Objective:  Physical Exam Constitutional:      Appearance: She is well-developed.  HENT:     Head: Normocephalic and atraumatic.  Cardiovascular:     Rate and Rhythm: Normal rate and regular rhythm.  Pulmonary:     Effort: Pulmonary effort is normal. No respiratory distress.     Breath sounds: Normal breath sounds. No wheezing or rales.  Abdominal:     General: Bowel sounds are normal. There is no distension.     Palpations: Abdomen is soft.     Tenderness: There is no abdominal tenderness. There is no rebound.  Musculoskeletal:     Cervical back: Normal range of motion.  Skin:    General: Skin is warm and dry.  Neurological:     Mental Status: She is alert and oriented to person, place, and time.     Coordination: Coordination normal.     Vitals:   09/29/23 0921  BP: 122/80  Pulse: (!) 48  SpO2: 97%  Weight: 172 lb (78 kg)  Height: 5' 9.75 (1.772 m)    Assessment &  Plan:

## 2023-09-29 NOTE — Patient Instructions (Addendum)
 We will increase the wellbutrin  (bupropion ) to 300 mg daily.   We will check the labs today.

## 2023-09-30 ENCOUNTER — Ambulatory Visit: Payer: Self-pay | Admitting: Internal Medicine

## 2023-09-30 MED ORDER — NITROFURANTOIN MONOHYD MACRO 100 MG PO CAPS: 100.0000 mg | ORAL_CAPSULE | Freq: Two times a day (BID) | ORAL | 0 refills | Status: AC

## 2023-10-01 ENCOUNTER — Encounter: Payer: Self-pay | Admitting: Internal Medicine

## 2023-10-02 NOTE — Assessment & Plan Note (Signed)
 Checking TSH and free T4 and adjust as needed levothyroxine .

## 2023-10-02 NOTE — Assessment & Plan Note (Signed)
 Worsening and will increase wellbutrin  to 300 mg daily and continue cymbalta  60 mg BID.

## 2023-10-02 NOTE — Assessment & Plan Note (Signed)
 Flu shot yearly. Pneumonia complete. Shingrix complete. Tetanus up to date. Colonoscopy due. Mammogram up to date, pap smear aged out and dexa complete. Counseled about sun safety and mole surveillance. Counseled about the dangers of distracted driving. Given 10 year screening recommendations.

## 2023-10-02 NOTE — Assessment & Plan Note (Signed)
 BP at goal and checking CMP. Continue atenolol /chlorthalidone 

## 2023-10-03 ENCOUNTER — Ambulatory Visit: Payer: Self-pay

## 2023-10-03 NOTE — Progress Notes (Signed)
 Medication was ordered on 8/12

## 2023-10-03 NOTE — Telephone Encounter (Signed)
  FYI Only or Action Required?: Action required by provider: requesting to change Wellbutrin  to 150mg  twice daily.  Patient was last seen in primary care on 09/29/2023 by Rollene Almarie LABOR, MD.  Called Nurse Triage reporting Headache.  Symptoms began today.  Interventions attempted: Nothing.  Symptoms are: stable.  Triage Disposition: Home Care  Patient/caregiver understands and will follow disposition?: YesCopied from CRM #[id number]. Topic: Clinical - Medication Question >> Oct 03, 2023  2:04 PM Viola F wrote: Reason for CRM: Patient has headache after taking buPROPion  (WELLBUTRIN  XL) 300 MG 24 hr tablet, nitrofurantoin , macrocrystal-monohydrate, (MACROBID ) 100 MG capsule and the DULoxetine  (CYMBALTA ) 60 MG capsule medication - wants to know what she should do. Please call [phone] Reason for Disposition  [1] Headache AND [2] has not taken pain medications  Answer Assessment - Initial Assessment Questions Patient reports seen in clinic Monday,  patient reports double dose Wellbutrin , started medication today and thinks that what started headache. Patient also started Nitrofurantoin  today, was not sure which medication caused headache. Reports 1x dizziness, denies blurred vision, dizziness at this time  Patient wants to take  Wellbutrin  150mg  xl twice daily. Advised patient to continue to take medication as prescribed until hearing back from PCP.  1. LOCATION: Where does it hurt?      Behind head at base of skull;  2. ONSET: When did the headache start? (e.g., minutes, hours, days)      Today; after taking wellbutrin   3. PATTERN: Does the pain come and go, or has it been constant since it started?     Was constant, feeling tender at base 4. SEVERITY: How bad is the pain? and What does it keep you from doing?  (e.g., Scale 1-10; mild, moderate, or severe)     3/10; havent took anything  6. CAUSE: What do you think is causing the headache?     One of the  medications, Wellbutrin  xl 300 7. MIGRAINE: Have you been diagnosed with migraine headaches? If Yes, ask: Is this headache similar?      no 8. HEAD INJURY: Has there been any recent injury to your head?      no 9. OTHER SYMPTOMS: Do you have any other symptoms? (e.g., fever, stiff neck, eye pain, sore throat, cold symptoms)     Dizziness x1  Protocols used: Headache-A-AH

## 2023-10-06 NOTE — Telephone Encounter (Signed)
 Called patient and spoke with her about the medication giving her headaches and read back providers message. She stated that she will continue to take it and give it 1-2 weeks and is she notice the headaches are getting sever she will either call the office and or send a my chart message, She also thought to if maybe her taking the antibiotics along with with Wellbutrin  was that causing her headache.

## 2023-10-06 NOTE — Telephone Encounter (Signed)
 I would recommend to give 1-2 weeks unless headache severe. This dosing is the same as 150 mg twice a day so both can give headache with change in dose.

## 2023-10-28 ENCOUNTER — Other Ambulatory Visit: Payer: Self-pay | Admitting: Internal Medicine

## 2023-10-30 ENCOUNTER — Other Ambulatory Visit: Payer: Self-pay | Admitting: Internal Medicine

## 2023-10-30 DIAGNOSIS — J069 Acute upper respiratory infection, unspecified: Secondary | ICD-10-CM

## 2023-11-03 ENCOUNTER — Other Ambulatory Visit: Payer: Self-pay | Admitting: Internal Medicine

## 2023-11-03 DIAGNOSIS — J069 Acute upper respiratory infection, unspecified: Secondary | ICD-10-CM

## 2023-11-14 ENCOUNTER — Ambulatory Visit: Payer: Self-pay

## 2023-11-14 NOTE — Telephone Encounter (Signed)
 Pt states that she has an appointment at the ED close to home and will be evaluated there for her UTI

## 2023-11-14 NOTE — Telephone Encounter (Signed)
Please advise as MD is out of office

## 2023-11-14 NOTE — Telephone Encounter (Signed)
 FYI Only or Action Required?: Action required by provider: request for appointment.  Patient was last seen in primary care on 09/29/2023 by Rollene Almarie LABOR, MD.  Called Nurse Triage reporting No chief complaint on file..  Symptoms began yesterday.  Interventions attempted: Nothing.  Symptoms are: gradually worsening.  Triage Disposition: See Physician Within 24 Hours  Patient/caregiver understands and will follow disposition?: Yes  Copied from CRM #[id number]. Topic: Clinical - Red Word Triage >> Nov 14, 2023  1:23 PM [place] J wrote: [facility] that prompted transfer to Nurse Triage: Patient believes she has a UTI. Her symptoms consist of pain while urinating, cloudy urine, and consistent urination. Reason for Disposition  Urinating more frequently than usual (i.e., frequency) OR new-onset of the feeling of an urgent need to urinate (i.e., urgency)  Answer Assessment - Initial Assessment Questions No available appts. Advised UC today, ED if symptoms worsen. Pt reports will go to UC.  1. SYMPTOM: What's the main symptom you're concerned about? (e.g., frequency, incontinence)     Frequency, urgency,  2. ONSET: When did the    start? yesterday 3. PAIN: Is there any pain? If Yes, ask: How bad is it? (Scale: 1-10; mild, moderate, severe)     7/10; tylenol  4. CAUSE: What do you think is causing the symptoms?     uti 5. OTHER SYMPTOMS: Do you have any other symptoms? (e.g., blood in urine, fever, flank pain, pain with urination) Cloudy urine, pain urination, denies blood, discharge, fever, chills, n/v  Protocols used: Urinary Symptoms-A-AH

## 2023-11-15 ENCOUNTER — Ambulatory Visit: Payer: Self-pay | Admitting: Nurse Practitioner

## 2023-11-15 ENCOUNTER — Ambulatory Visit
Admission: RE | Admit: 2023-11-15 | Discharge: 2023-11-15 | Disposition: A | Discharge status: Home / Self Care | Source: Ambulatory Visit | Attending: Nurse Practitioner | Admitting: Nurse Practitioner

## 2023-11-15 ENCOUNTER — Ambulatory Visit

## 2023-11-15 VITALS — BP 130/75 | HR 67 | Temp 97.6°F | Resp 16

## 2023-11-15 DIAGNOSIS — L989 Disorder of the skin and subcutaneous tissue, unspecified: Secondary | ICD-10-CM | POA: Diagnosis not present

## 2023-11-15 DIAGNOSIS — R0982 Postnasal drip: Secondary | ICD-10-CM | POA: Insufficient documentation

## 2023-11-15 DIAGNOSIS — N3001 Acute cystitis with hematuria: Secondary | ICD-10-CM | POA: Insufficient documentation

## 2023-11-15 DIAGNOSIS — R300 Dysuria: Secondary | ICD-10-CM | POA: Insufficient documentation

## 2023-11-15 DIAGNOSIS — S20211A Contusion of right front wall of thorax, initial encounter: Secondary | ICD-10-CM

## 2023-11-15 DIAGNOSIS — I700 Atherosclerosis of aorta: Secondary | ICD-10-CM | POA: Diagnosis not present

## 2023-11-15 DIAGNOSIS — S2231XA Fracture of one rib, right side, initial encounter for closed fracture: Secondary | ICD-10-CM | POA: Diagnosis not present

## 2023-11-15 LAB — POCT URINE DIPSTICK
Bilirubin, UA: NEGATIVE
Glucose, UA: NEGATIVE mg/dL
Ketones, POC UA: NEGATIVE mg/dL
Nitrite, UA: POSITIVE — AB
Spec Grav, UA: >=1.03 — AB (ref 1.010–1.025)
Urobilinogen, UA: 0.2 U/dL
pH, UA: 6 (ref 5.0–8.0)

## 2023-11-15 MED ORDER — NITROFURANTOIN MONOHYD MACRO 100 MG PO CAPS: 100.0000 mg | ORAL_CAPSULE | Freq: Two times a day (BID) | ORAL | 0 refills | Status: DC

## 2023-11-15 MED ORDER — NAPROXEN 500 MG PO TABS: 500.0000 mg | ORAL_TABLET | Freq: Two times a day (BID) | ORAL | 0 refills | Status: DC

## 2023-11-15 NOTE — ED Triage Notes (Signed)
 Pt st's she started having urinary frequency yesterday and urgency  Pt st's she was  unable to make it to the bathroom and urinated some on the floor.  Pt c/o pain to right upper rib area  Pt wants a small bump on right lower leg looked at  St's has been there for over a month

## 2023-11-15 NOTE — ED Provider Notes (Signed)
 [facility]    CSN: [id number] Arrival date & time: 11/15/23  9085      History   Chief Complaint Chief Complaint  Patient presents with   Urinary Frequency    Probable UTI. - Entered by patient   Fall    HPI Theresa Perez is a 73 y.o. female.   Discussed the use of AI scribe software for clinical note transcription with the patient, who gave verbal consent to proceed.   The patient presents with urinary urgency and frequency that began two days ago, accompanied by mild incontinence. On the same day, she slipped and struck her right rib area against the shower curb, resulting in localized soreness and pain with deep inspiration. She denies bruising to the area and reports no shortness of breath. She denies nausea, vomiting, fevers, or abdominal pain. She does report ongoing post-nasal drainage for the past several weeks, which she is able to cough up. She denies associated congestion, sneezing, sore throat, or sinus pressure and notes that she has been using Flonase  and Prilosec. The patient also reports a spot on the back of her right leg that has been present for more than a month without pain, redness, swelling, itching, or discomfort, and she is unsure of the cause.  The following sections of the patient's history were reviewed and updated as appropriate: allergies, current medications, past family history, past medical history, past social history, past surgical history, and problem list.           Past Medical History:  Diagnosis Date   Allergic rhinitis 05/19/2007   Qualifier: Diagnosis of   By: Mavis MD, Norleen BRAVO        Allergy    Anxiety    Arthritis    Cancer [facility]) 1997   Thyroid -Papillary   Complication of anesthesia    vomitting only   Constipation 07/13/2014   Cyst of breast, right, diffuse fibrocystic    Depression    Hypertension    Hypothyroidism    Memory change 06/25/2019   PONV (postoperative nausea and vomiting)    Pure  hypercholesterolemia 12/08/2013   Smokers' cough (HCC) 12/11/2017    Patient Active Problem List   Diagnosis Date Noted   Cyst of ovary 03/07/2023   Mass of left lung 02/21/2023   Hypokalemia 02/21/2023   Postoperative hypothyroidism 02/21/2023   Toe pain, right 01/25/2022   Bilateral primary osteoarthritis of knee 06/14/2021   Anxiety and depression 08/02/2019   Everitt Quervain's disease (radial styloid tenosynovitis) 04/01/2017   Dysphagia 12/06/2016   Routine general medical examination at a health care facility 12/16/2014   Papillary thyroid  carcinoma (HCC) 03/23/2014   Varicose veins of bilateral lower extremities with other complications 12/09/2011   Essential hypertension 05/19/2007    Past Surgical History:  Procedure Laterality Date   ABDOMINAL HYSTERECTOMY     ANTERIOR AND POSTERIOR REPAIR N/A 03/31/2012   Procedure: ANTERIOR (CYSTOCELE) AND POSTERIOR REPAIR (RECTOCELE);  Surgeon: Peggye Gull, MD;  Location: [facility];  Service: Gynecology;  Laterality: N/A;   aspiration cyst right breast     BREAST BIOPSY Left 01/09/2022   MM LT BREAST BX W LOC DEV 1ST LESION IMAGE BX SPEC STEREO GUIDE 01/09/2022 [facility]   BRONCHIAL BIOPSY  03/11/2023   Procedure: BRONCHIAL BIOPSIES;  Surgeon: Shelah Lamar RAMAN, MD;  Location: [facility] ENDOSCOPY;  Service: Pulmonary;;   BRONCHIAL BRUSHINGS  03/11/2023   Procedure: BRONCHIAL BRUSHINGS;  Surgeon: Shelah Lamar RAMAN, MD;  Location: [facility] ENDOSCOPY;  Service:  Pulmonary;;   BRONCHIAL NEEDLE ASPIRATION BIOPSY  03/11/2023   Procedure: BRONCHIAL NEEDLE ASPIRATION BIOPSIES;  Surgeon: Shelah Lamar RAMAN, MD;  Location: [facility] ENDOSCOPY;  Service: Pulmonary;;   THYROIDECTOMY  02/19/1995   with parathyroidectomy   TONSILLECTOMY  02/19/1967   uterine ablation     VAGINAL HYSTERECTOMY N/A 03/31/2012   Procedure: HYSTERECTOMY VAGINAL;  Surgeon: Peggye Gull, MD;  Location: [facility];  Service: Gynecology;  Laterality: N/A;    OB History   No obstetric history on  file.      Home Medications    Prior to Admission medications   Medication Sig Start Date End Date Taking? Authorizing Provider  naproxen (NAPROSYN) 500 MG tablet Take 1 tablet (500 mg total) by mouth 2 (two) times daily with a meal. Take with food to avoid stomach upset. Do not take any additional NSAIDs while on this. You may take tylenol  in addition to this if needed for extra pain relief. 11/15/23  Yes Iola Lukes, FNP  nitrofurantoin , macrocrystal-monohydrate, (MACROBID ) 100 MG capsule Take 1 capsule (100 mg total) by mouth 2 (two) times daily. 11/15/23  Yes Iola Lukes, FNP  albuterol  (VENTOLIN  HFA) 108 (90 Base) MCG/ACT inhaler Inhale 2 puffs into the lungs every 6 (six) hours as needed for wheezing or shortness of breath. 03/04/23   Ruthell Lauraine FALCON, NP  Ascorbic Acid (VITAMIN C PO) Take 1 tablet by mouth daily.    [provider]  atenolol -chlorthalidone  (TENORETIC ) 50-25 MG tablet Take 0.5 tablets by mouth daily. 09/29/23   Rollene Almarie LABOR, MD  buPROPion  (WELLBUTRIN  XL) 300 MG 24 hr tablet Take 1 tablet (300 mg total) by mouth daily. 09/29/23   Rollene Almarie LABOR, MD  CALCIUM PO Take 1 tablet by mouth daily.    [provider]  COENZYME Q-10 PO Take 1 tablet by mouth daily.    [provider]  Cyanocobalamin (VITAMIN B-12 PO) Take 1 tablet by mouth daily.    [provider]  DULoxetine  (CYMBALTA ) 60 MG capsule Take 1 capsule (60 mg total) by mouth 2 (two) times daily. 09/29/23   Rollene Almarie LABOR, MD  ferrous sulfate 325 (65 FE) MG tablet Take 325 mg by mouth every evening.     [provider]  fluticasone  (FLONASE ) 50 MCG/ACT nasal spray Use 2 spray(s) in each nostril once daily 11/03/23   Rollene Almarie LABOR, MD  Ginkgo Biloba (GNP GINGKO BILOBA EXTRACT PO) Take 1 tablet by mouth daily.    [provider]  levothyroxine  (SYNTHROID ) 112 MCG tablet Take 1 tablet (112 mcg total) by mouth daily. 09/29/23   Rollene Almarie LABOR, MD  LORazepam  (ATIVAN ) 0.5 MG tablet TAKE 1 TABLET BY MOUTH ONCE DAILY AS NEEDED FOR ANXIETY 10/30/23   Rollene Almarie LABOR, MD  Misc Natural Products (GLUCOSAMINE CHOND COMPLEX/MSM) TABS Take 1 tablet by mouth 2 (two) times daily.    [provider]  Multiple Vitamins-Minerals (MULTIVITAMIN WITH MINERALS) tablet Take 1 tablet by mouth daily.    [provider]  Nutritional Supplements (DHEA PO) Take 1 tablet by mouth daily.    [provider]  omeprazole  (PRILOSEC) 20 MG capsule Take 1 capsule (20 mg total) by mouth daily. 04/01/23   Beather Delon Gibson, PA  Turmeric (QC TUMERIC COMPLEX PO) Take 1 tablet by mouth daily.    [provider]    Family History Family History  Problem Relation Age of Onset   Diabetes Mother    Deep vein thrombosis Mother  Varicose Veins   Alzheimer's disease Mother    Cancer Father        Father died from brain tumor.   Heart disease Father    Other Brother        Heat stroke   Hypertension Son    Colon cancer Neg Hx    Stomach cancer Neg Hx    Esophageal cancer Neg Hx    Rectal cancer Neg Hx     Social History Social History   Tobacco Use   Smoking status: Some Days    Current packs/day: 0.50    Average packs/day: 0.5 packs/day for 15.0 years (7.5 ttl pk-yrs)    Types: Cigarettes    Passive exposure: Past   Smokeless tobacco: Never   Tobacco comments:    Smokes 2-3 cigs some days 03-19-2023  Vaping Use   Vaping status: Never Used  Substance Use Topics   Alcohol use: Yes    Comment: occ wine   Drug use: No     Allergies   Permethrin    Review of Systems Review of Systems  Constitutional:  Negative for fever.  HENT:  Positive for postnasal drip (ongoing for the past couple of weeks). Negative for congestion, rhinorrhea, sneezing and sore throat.   Respiratory:  Positive for cough (coughing up the drainage from throat). Negative for shortness of breath and wheezing.         Can sometimes hear a little rattle in chest when breathing   Gastrointestinal:  Negative for abdominal pain, nausea and vomiting.  Genitourinary:  Positive for dysuria (mild), frequency and urgency.       Some incontinence; unable to make it to the bathroom at times due to urgency or incontinence. Has cloudy urine with odor.    Musculoskeletal:  Positive for arthralgias (right rib pain).  All other systems reviewed and are negative.    Physical Exam Triage Vital Signs ED Triage Vitals  Encounter Vitals Group     BP 11/15/23 1004 130/75     Girls Systolic BP Percentile --      Girls Diastolic BP Percentile --      Boys Systolic BP Percentile --      Boys Diastolic BP Percentile --      Pulse Rate 11/15/23 1004 67     Resp 11/15/23 1004 16     Temp 11/15/23 1004 97.6 F (36.4 C)     Temp Source 11/15/23 1004 Oral     SpO2 11/15/23 1004 93 %     Weight --      Height --      Head Circumference --      Peak Flow --      Pain Score 11/15/23 1012 4     Pain Loc --      Pain Education --      Exclude from Growth Chart --    No data found.  Updated Vital Signs BP 130/75   Pulse 67   Temp 97.6 F (36.4 C) (Oral)   Resp 16   SpO2 93%   Visual Acuity Right Eye Distance:   Left Eye Distance:   Bilateral Distance:    Right Eye Near:   Left Eye Near:    Bilateral Near:     Physical Exam Vitals reviewed.  Constitutional:      General: She is awake. She is not in acute distress.    Appearance: Normal appearance. She is well-developed. She is not ill-appearing, toxic-appearing or diaphoretic.  HENT:  Head: Normocephalic.     Right Ear: Hearing, tympanic membrane, ear canal and external ear normal.     Left Ear: Hearing, tympanic membrane, ear canal and external ear normal.     Nose: Nose normal.     Mouth/Throat:     Mouth: Mucous membranes are moist.     Pharynx: Oropharynx is clear. Uvula midline.  Eyes:     General: Vision grossly intact.      Conjunctiva/sclera: Conjunctivae normal.  Cardiovascular:     Rate and Rhythm: Normal rate and regular rhythm.     Heart sounds: Normal heart sounds.  Pulmonary:     Effort: Pulmonary effort is normal.     Breath sounds: Normal breath sounds and air entry.  Chest:     Chest wall: Tenderness present.    Abdominal:     Palpations: Abdomen is soft.     Tenderness: There is no abdominal tenderness. There is no right CVA tenderness or left CVA tenderness.  Musculoskeletal:        General: Normal range of motion.     Cervical back: Normal range of motion and neck supple.  Lymphadenopathy:     Cervical: No cervical adenopathy.  Skin:    General: Skin is warm and dry.     Findings: Lesion present.     Comments: There is a well-circumscribed circular lesion measuring approximately 1 cm in diameter with minimal surrounding erythema and a callus-like texture. The area is nontender, without underlying fluctuance or warmth.  Neurological:     General: No focal deficit present.     Mental Status: She is alert and oriented to person, place, and time.  Psychiatric:        Speech: Speech normal.        Behavior: Behavior is cooperative.      UC Treatments / Results  Labs (all labs ordered are listed, but only abnormal results are displayed) Labs Reviewed  POCT URINE DIPSTICK - Abnormal; Notable for the following components:      Result Value   Color, UA straw (*)    Clarity, UA cloudy (*)    Spec Grav, UA >=1.030 (*)    Blood, UA small (*)    Nitrite, UA Positive (*)    Leukocytes, UA Small (1+) (*)    All other components within normal limits  URINE CULTURE    EKG   Radiology No results found.  Procedures Procedures (including critical care time)  Medications Ordered in UC Medications - No data to display  Initial Impression / Assessment and Plan / UC Course  I have reviewed the triage vital signs and the nursing notes.  Pertinent labs & imaging results that were  available during my care of the patient were reviewed by me and considered in my medical decision making (see chart for details).     Patient presents with urinary urgency, frequency, and incontinence for the past two days. Urinalysis is consistent with urinary tract infection, showing leukocytes, nitrites, and microscopic hematuria. She was prescribed nitrofurantoin  twice daily for five days, and urine culture was sent with plan to adjust treatment if resistance is noted. She denies nausea, vomiting, fevers, or abdominal pain. Patient also reports rib pain after slipping and striking her right side against the shower curb. Exam shows localized tenderness over the right lower rib with deep breathing and palpation, without swelling or bruising. Differential includes rib contusion versus fracture. Chest x-ray was ordered, and naproxen twice daily was prescribed for pain control  with instructions to avoid other NSAIDs. She may use Tylenol  as needed for additional relief. Supportive care with ice for 24-48 hours followed by heat was recommended. Results of XR pending. Patient will be notified only if results are abnormal. She further reports ongoing post-nasal drainage for several weeks, without congestion, sneezing, sore throat, or sinus pressure. She has been using Flonase  and Prilosec with some benefit. She was advised to continue these therapies and use a humidifier at night as well as elevating the head of her bed to help with drainage. Additionally, she notes a spot on the back of her right leg present for over a month. Exam reveals a well-circumscribed 1 cm lesion with callus-like texture, minimal surrounding erythema, no tenderness, fluctuance, or warmth. No urgent intervention is indicated. She was advised to follow up with her primary care provider for further evaluation. Patient was counseled on indications for emergency care including sudden worsening of urinary symptoms, fever, flank pain, shortness of  breath, severe rib pain with difficulty breathing, or rapid changes in the leg lesion. She was also advised to follow up with her PCP if symptoms do not improve or worsen.  Today's evaluation has revealed no signs of a dangerous process. Discussed diagnosis with patient and/or guardian. Patient and/or guardian aware of their diagnosis, possible red flag symptoms to watch out for and need for close follow up. Patient and/or guardian understands verbal and written discharge instructions. Patient and/or guardian comfortable with plan and disposition.  Patient and/or guardian has a clear mental status at this time, good insight into illness (after discussion and teaching) and has clear judgment to make decisions regarding their care  Documentation was completed with the aid of voice recognition software. Transcription may contain typographical errors.  Final Clinical Impressions(s) / UC Diagnoses   Final diagnoses:  Dysuria  Acute cystitis with hematuria  Contusion of rib on right side, initial encounter  Post-nasal drainage  Skin lesion of right leg     Discharge Instructions      You were seen today for several concerns. Your urine test shows a urinary tract infection. You have been prescribed an antibiotic called nitrofurantoin  to take twice a day for five days. Be sure to take every dose as prescribed, even if your symptoms start to improve. Your urine has been sent for culture, and if the results show that a different antibiotic is needed, we will call you with an update. While on antibiotics, drink plenty of fluids to help flush bacteria from your system and empty your bladder often. Over-the-counter pain relievers such as Tylenol  can be used for discomfort or burning when urinating.   You also reported pain in your right ribs after falling and hitting your side on the shower curb. X-rays were ordered to check for a possible fracture. Right now, this appears most consistent with a rib bruise  or contusion. You will only be notified if your XR is abnormal. Use ice packs on the sore area for the first 24 to 48 hours, then switch to heat as needed. Naproxen has been prescribed twice a day for pain--do not take other NSAIDs such as ibuprofen  or Aleve while on this. If you need additional pain relief, you may use Tylenol  as directed. Try to avoid heavy lifting or strenuous activity until the pain improves.  For your ongoing post-nasal drainage, continue using your Flonase  and Prilosec as you have been. Using a humidifier at night and keeping your head slightly elevated when you sleep may also  help reduce drainage and coughing.  You also pointed out a spot on the back of your leg that has been present for more than a month. On exam, it looks like a small, callus-like area with no signs of infection or concerning changes. Since it has been there for a while, you should schedule an appointment with your primary care provider to have it checked further, but there is no need for urgent care at this time.  Please follow up with your primary care provider if your urinary symptoms, rib pain, or leg lesion do not improve. Go to the emergency department if you develop fever, chills, severe abdominal or flank pain, blood in your urine, sudden worsening rib pain with difficulty breathing, shortness of breath, or if the spot on your leg becomes rapidly painful, red, swollen, or starts draining.      ED Prescriptions     Medication Sig Dispense Auth. Provider   nitrofurantoin , macrocrystal-monohydrate, (MACROBID ) 100 MG capsule Take 1 capsule (100 mg total) by mouth 2 (two) times daily. 10 capsule Iola Lukes, FNP   naproxen (NAPROSYN) 500 MG tablet Take 1 tablet (500 mg total) by mouth 2 (two) times daily with a meal. Take with food to avoid stomach upset. Do not take any additional NSAIDs while on this. You may take tylenol  in addition to this if needed for extra pain relief. 20 tablet Iola Lukes, FNP      PDMP not reviewed this encounter.   Iola Lukes, [place] 11/15/23 1147

## 2023-11-15 NOTE — Discharge Instructions (Addendum)
 You were seen today for several concerns. Your urine test shows a urinary tract infection. You have been prescribed an antibiotic called nitrofurantoin  to take twice a day for five days. Be sure to take every dose as prescribed, even if your symptoms start to improve. Your urine has been sent for culture, and if the results show that a different antibiotic is needed, we will call you with an update. While on antibiotics, drink plenty of fluids to help flush bacteria from your system and empty your bladder often. Over-the-counter pain relievers such as Tylenol  can be used for discomfort or burning when urinating.   You also reported pain in your right ribs after falling and hitting your side on the shower curb. X-rays were ordered to check for a possible fracture. Right now, this appears most consistent with a rib bruise or contusion. You will only be notified if your XR is abnormal. Use ice packs on the sore area for the first 24 to 48 hours, then switch to heat as needed. Naproxen has been prescribed twice a day for pain--do not take other NSAIDs such as ibuprofen  or Aleve while on this. If you need additional pain relief, you may use Tylenol  as directed. Try to avoid heavy lifting or strenuous activity until the pain improves.  For your ongoing post-nasal drainage, continue using your Flonase  and Prilosec as you have been. Using a humidifier at night and keeping your head slightly elevated when you sleep may also help reduce drainage and coughing.  You also pointed out a spot on the back of your leg that has been present for more than a month. On exam, it looks like a small, callus-like area with no signs of infection or concerning changes. Since it has been there for a while, you should schedule an appointment with your primary care provider to have it checked further, but there is no need for urgent care at this time.  Please follow up with your primary care provider if your urinary symptoms, rib pain,  or leg lesion do not improve. Go to the emergency department if you develop fever, chills, severe abdominal or flank pain, blood in your urine, sudden worsening rib pain with difficulty breathing, shortness of breath, or if the spot on your leg becomes rapidly painful, red, swollen, or starts draining.

## 2023-11-15 NOTE — Progress Notes (Signed)
 Called pt. X's 2 to inform results of x-ray results.  No answer  Left message x's 2 for pt to return call

## 2023-11-17 LAB — URINE CULTURE: Culture: >=100000 — AB

## 2023-11-26 ENCOUNTER — Other Ambulatory Visit: Payer: Self-pay | Admitting: Internal Medicine

## 2023-12-01 NOTE — Telephone Encounter (Signed)
 LOV:09/29/23 Last fill: 10/30/23, 20 tablet 0 refill

## 2023-12-01 NOTE — Telephone Encounter (Signed)
 Copied from CRM [id number]. Topic: Clinical - Medication Question >> Dec 01, 2023 12:12 PM Thersia BROCKS wrote: Reason for CRM: Patient called in regarding prescription for LORazepam  (ATIVAN ) 0.5 MG tablet , stated she has requested that last week and still has not been sent to pharmacy, would like for a nurse to give her a callback following up on this

## 2023-12-10 ENCOUNTER — Encounter: Payer: Self-pay | Admitting: Internal Medicine

## 2023-12-15 DIAGNOSIS — D485 Neoplasm of uncertain behavior of skin: Secondary | ICD-10-CM | POA: Diagnosis not present

## 2023-12-15 DIAGNOSIS — L728 Other follicular cysts of the skin and subcutaneous tissue: Secondary | ICD-10-CM | POA: Diagnosis not present

## 2023-12-15 DIAGNOSIS — D1801 Hemangioma of skin and subcutaneous tissue: Secondary | ICD-10-CM | POA: Diagnosis not present

## 2023-12-15 DIAGNOSIS — C44722 Squamous cell carcinoma of skin of right lower limb, including hip: Secondary | ICD-10-CM | POA: Diagnosis not present

## 2023-12-15 DIAGNOSIS — L821 Other seborrheic keratosis: Secondary | ICD-10-CM | POA: Diagnosis not present

## 2023-12-15 DIAGNOSIS — L918 Other hypertrophic disorders of the skin: Secondary | ICD-10-CM | POA: Diagnosis not present

## 2023-12-23 ENCOUNTER — Telehealth: Admitting: Internal Medicine

## 2023-12-23 ENCOUNTER — Encounter: Payer: Self-pay | Admitting: Internal Medicine

## 2023-12-23 DIAGNOSIS — F428 Other obsessive-compulsive disorder: Secondary | ICD-10-CM

## 2023-12-23 DIAGNOSIS — F32A Depression, unspecified: Secondary | ICD-10-CM

## 2023-12-23 DIAGNOSIS — F419 Anxiety disorder, unspecified: Secondary | ICD-10-CM

## 2023-12-23 DIAGNOSIS — F411 Generalized anxiety disorder: Secondary | ICD-10-CM | POA: Diagnosis not present

## 2023-12-23 MED ORDER — LORAZEPAM 0.5 MG PO TABS: 0.5000 mg | ORAL_TABLET | Freq: Every day | ORAL | 5 refills | Status: AC | PRN

## 2023-12-23 MED ORDER — BUPROPION HCL ER (XL) 150 MG PO TB24: 150.0000 mg | ORAL_TABLET | Freq: Every day | ORAL | 1 refills | Status: AC

## 2023-12-23 NOTE — Assessment & Plan Note (Signed)
 Overall worsened due to stress with husband's health condition. We will reduce wellbutrin  to 150 mg daily and keep cymbalta  120 mg daily. In 2-4 weeks she will let us  know how she is doing. If no change to mood we will likely stop wellbutrin . She is adding otc supplement which was helpful in the past. We did discuss viibryd, trintellix, and vraylar as options to add if needed. Discussed counseling. She is also using lorazepam  up to 20 per month refill done today.

## 2023-12-23 NOTE — Progress Notes (Signed)
 Virtual Visit via Video Note  I connected with Theresa Perez on 12/23/23 at  8:40 AM EST by a video enabled telemedicine application and verified that I am speaking with the correct person using two identifiers.  The patient and the provider were at separate locations throughout the entire encounter. Patient location: home, Provider location: work   I discussed the limitations of evaluation and management by telemedicine and the availability of in person appointments. The patient expressed understanding and agreed to proceed. The patient and the provider were the only parties present for the visit unless noted in HPI below.  History of Present Illness: Discussed the use of AI scribe software for clinical note transcription with the patient, who gave verbal consent to proceed.  History of Present Illness Theresa Perez is a 73 year old female who presents with concerns about mood changes and medication management.  Her mood has been significantly affected by her husband's health conditions, which have been a source of stress. Her mood has not improved with an increased dosage of bupropion  from 150 mg to 300 mg, and she is uncertain if the medication is helping. She describes her mood as having 'good day, bad day' patterns, with her good days now being just 'okay' and her bad days not as low as before. Overall, her mood seems to be more stable yet not improved.  She has been taking duloxetine  for a longer period than bupropion  and questions its continued effectiveness. She has been taking lorazepam  as needed, usually half a tablet when she wakes up early and cannot return to sleep due to anxiety, finding it helpful in calming her down.  She recently resumed taking NAC, a supplement for immune support, after purchasing more from [employer], as she had previously found it helpful.  Her husband's ongoing health issues, including back pain, have been a significant stressor. He has a doctor's  appointment scheduled for next Monday.  Observations/Objective: Appearance: normal, breathing appears normal, casual grooming, memory normal, mental status is A and O times 3  Assessment and Plan Assessment & Plan Depression and Generalized Anxiety Disorder   Mood instability continues despite increased bupropion , possibly exacerbated by stress from her husband's health issues. Alternative medications are considered due to limited efficacy. Newer medications like Trintellix, Viibryd, and Vraylar were discussed. NAC may help stabilize mood. Decrease bupropion  to 150 mg daily for 2-4 weeks. Monitor mood and anxiety symptoms during this period. Consider discontinuing bupropion  if no change is observed. Evaluate switching to Trintellix, Viibryd, or Vraylar if necessary. Continue NAC supplement.  Medication management for mood and anxiety   Her current regimen includes bupropion  and duloxetine , with lorazepam  used as needed for acute anxiety. Prescription management for lorazepam  was discussed. Ensure lorazepam  prescription includes refills and notify the office 5 days in advance for refills. Continue lorazepam  as needed for anxiety.  Follow Up Instructions: decrease wellbutrin  to 150 mg daily let us  know in 2-4 weeks  I discussed the assessment and treatment plan with the patient. The patient was provided an opportunity to ask questions and all were answered. The patient agreed with the plan and demonstrated an understanding of the instructions.   The patient was advised to call back or seek an in-person evaluation if the symptoms worsen or if the condition fails to improve as anticipated.  Theresa DELENA [place], MD

## 2023-12-24 DIAGNOSIS — C44722 Squamous cell carcinoma of skin of right lower limb, including hip: Secondary | ICD-10-CM | POA: Diagnosis not present

## 2023-12-31 DIAGNOSIS — Z85828 Personal history of other malignant neoplasm of skin: Secondary | ICD-10-CM | POA: Diagnosis not present

## 2023-12-31 DIAGNOSIS — Z08 Encounter for follow-up examination after completed treatment for malignant neoplasm: Secondary | ICD-10-CM | POA: Diagnosis not present

## 2024-01-08 ENCOUNTER — Ambulatory Visit: Payer: Self-pay | Admitting: *Deleted

## 2024-01-08 NOTE — Telephone Encounter (Signed)
 No available appt today or tomorrow. Patient requesting call back if she can submit sample instead of going to UC as recommended. Please advise.     FYI Only or Action Required?: Action required by provider: request for appointment and requesting to submit sample instead of going to UC.  Patient was last seen in primary care on 12/23/2023 by Rollene Almarie LABOR, MD.  Called Nurse Triage reporting Dysuria.  Symptoms began several days ago.  Interventions attempted: Rest, hydration, or home remedies.  Symptoms are: gradually worsening.  Triage Disposition: See Physician Within 24 Hours  Patient/caregiver understands and will follow disposition?: No, wishes to speak with PCP                Copied from CRM #[id number]. Topic: Clinical - Red Word Triage >> Jan 08, 2024 11:33 AM Victoria A wrote: [facility] that prompted transfer to Nurse Triage: Patient thinks she has possible UTI urgency, frequency and pain Reason for Disposition  Urinating more frequently than usual (i.e., frequency) OR new-onset of the feeling of an urgent need to urinate (i.e., urgency)  Answer Assessment - Initial Assessment Questions No available appt until next week with any provider at practice. Patient requesting if she can submit sample at office today. Hx UTIs back in the summer. Recommended UC . Patient requesting call back if she can submit sample today. Do not feel patient will go to UC.       1. SYMPTOM: What's the main symptom you're concerned about? (e.g., frequency, incontinence)     Frequency, urgency, pain with urination 2. ONSET: When did the  sx   start?     Few days ago  3. PAIN: Is there any pain? If Yes, ask: How bad is it? (Scale: 1-10; mild, moderate, severe)     Moderate  4. CAUSE: What do you think is causing the symptoms?     UTI  5. OTHER SYMPTOMS: Do you have any other symptoms? (e.g., blood in urine, fever, flank pain, pain with urination)     Frequency,  urgency , pain with urination, no fever no pain in side or back. No odor reported no blood in urine  6. PREGNANCY: Is there any chance you are pregnant? When was your last menstrual period?     na  Protocols used: Urinary Symptoms-A-AH

## 2024-01-09 ENCOUNTER — Other Ambulatory Visit: Payer: Self-pay | Admitting: Internal Medicine

## 2024-01-09 ENCOUNTER — Other Ambulatory Visit (INDEPENDENT_AMBULATORY_CARE_PROVIDER_SITE_OTHER)

## 2024-01-09 ENCOUNTER — Ambulatory Visit: Payer: Self-pay | Admitting: Internal Medicine

## 2024-01-09 DIAGNOSIS — R300 Dysuria: Secondary | ICD-10-CM

## 2024-01-09 LAB — URINALYSIS, ROUTINE W REFLEX MICROSCOPIC
Bilirubin Urine: NEGATIVE
Nitrite: POSITIVE — AB
Specific Gravity, Urine: 1.02 (ref 1.000–1.030)
Urine Glucose: NEGATIVE
Urobilinogen, UA: 1 (ref 0.0–1.0)
pH: 5.5 (ref 5.0–8.0)

## 2024-01-09 MED ORDER — NITROFURANTOIN MONOHYD MACRO 100 MG PO CAPS: 100.0000 mg | ORAL_CAPSULE | Freq: Two times a day (BID) | ORAL | 0 refills | Status: AC

## 2024-01-09 NOTE — Telephone Encounter (Signed)
 Per DPR LVM for pt.

## 2024-01-09 NOTE — Telephone Encounter (Signed)
 Placed order she can come get done.

## 2024-02-16 ENCOUNTER — Other Ambulatory Visit: Payer: Self-pay | Admitting: Internal Medicine

## 2024-02-16 DIAGNOSIS — J069 Acute upper respiratory infection, unspecified: Secondary | ICD-10-CM

## 2024-02-25 ENCOUNTER — Other Ambulatory Visit: Payer: Self-pay | Admitting: Physician Assistant

## 2024-08-30 ENCOUNTER — Ambulatory Visit
